# Patient Record
Sex: Female | Born: 1945
Health system: Southern US, Community
[De-identification: ages and names within clinical notes are randomized; demographics above are authoritative.]

## PROBLEM LIST (undated history)

## (undated) DIAGNOSIS — M1990 Unspecified osteoarthritis, unspecified site: Secondary | ICD-10-CM

## (undated) DIAGNOSIS — K219 Gastro-esophageal reflux disease without esophagitis: Secondary | ICD-10-CM

## (undated) DIAGNOSIS — M810 Age-related osteoporosis without current pathological fracture: Secondary | ICD-10-CM

## (undated) DIAGNOSIS — C801 Malignant (primary) neoplasm, unspecified: Secondary | ICD-10-CM

## (undated) DIAGNOSIS — C50919 Malignant neoplasm of unspecified site of unspecified female breast: Secondary | ICD-10-CM

## (undated) DIAGNOSIS — Z853 Personal history of malignant neoplasm of breast: Secondary | ICD-10-CM

## (undated) DIAGNOSIS — C8190 Hodgkin lymphoma, unspecified, unspecified site: Secondary | ICD-10-CM

## (undated) DIAGNOSIS — T7840XA Allergy, unspecified, initial encounter: Secondary | ICD-10-CM

## (undated) DIAGNOSIS — Z923 Personal history of irradiation: Secondary | ICD-10-CM

## (undated) DIAGNOSIS — I10 Essential (primary) hypertension: Secondary | ICD-10-CM

## (undated) HISTORY — PX: TUBAL LIGATION: SHX77

## (undated) HISTORY — DX: Age-related osteoporosis without current pathological fracture: M81.0

## (undated) HISTORY — DX: Gastro-esophageal reflux disease without esophagitis: K21.9

## (undated) HISTORY — DX: Allergy, unspecified, initial encounter: T78.40XA

## (undated) HISTORY — PX: EYE SURGERY: SHX253

## (undated) HISTORY — DX: Malignant (primary) neoplasm, unspecified: C80.1

## (undated) HISTORY — PX: BACK SURGERY: SHX140

## (undated) HISTORY — PX: PORT A CATH REVISION: SHX6033

## (undated) HISTORY — DX: Essential (primary) hypertension: I10

## (undated) HISTORY — DX: Personal history of malignant neoplasm of breast: Z85.3

## (undated) HISTORY — DX: Hodgkin lymphoma, unspecified, unspecified site: C81.90

## (undated) HISTORY — PX: JOINT REPLACEMENT: SHX530

## (undated) HISTORY — PX: ABDOMINAL HYSTERECTOMY: SHX81

---

## 2001-10-30 HISTORY — PX: COLONOSCOPY: SHX174

## 2002-10-30 DIAGNOSIS — C8190 Hodgkin lymphoma, unspecified, unspecified site: Secondary | ICD-10-CM

## 2002-10-30 HISTORY — DX: Hodgkin lymphoma, unspecified, unspecified site: C81.90

## 2003-10-31 HISTORY — PX: WRIST SURGERY: SHX841

## 2004-07-30 ENCOUNTER — Ambulatory Visit: Payer: Self-pay | Admitting: Internal Medicine

## 2004-08-30 ENCOUNTER — Ambulatory Visit: Payer: Self-pay | Admitting: Internal Medicine

## 2004-09-02 ENCOUNTER — Ambulatory Visit: Payer: Self-pay | Admitting: Internal Medicine

## 2004-09-07 ENCOUNTER — Ambulatory Visit: Payer: Self-pay | Admitting: Orthopedic Surgery

## 2004-10-11 ENCOUNTER — Ambulatory Visit: Payer: Self-pay | Admitting: Internal Medicine

## 2004-10-30 ENCOUNTER — Ambulatory Visit: Payer: Self-pay | Admitting: Internal Medicine

## 2004-10-30 DIAGNOSIS — I10 Essential (primary) hypertension: Secondary | ICD-10-CM

## 2004-10-30 HISTORY — DX: Essential (primary) hypertension: I10

## 2004-11-30 ENCOUNTER — Ambulatory Visit: Payer: Self-pay | Admitting: Internal Medicine

## 2004-12-02 ENCOUNTER — Ambulatory Visit: Payer: Self-pay | Admitting: Internal Medicine

## 2004-12-28 ENCOUNTER — Ambulatory Visit: Payer: Self-pay | Admitting: Internal Medicine

## 2005-01-06 ENCOUNTER — Ambulatory Visit: Payer: Self-pay | Admitting: Family Medicine

## 2005-01-28 ENCOUNTER — Ambulatory Visit: Payer: Self-pay | Admitting: Internal Medicine

## 2005-01-30 ENCOUNTER — Ambulatory Visit: Payer: Self-pay | Admitting: Family Medicine

## 2005-02-27 ENCOUNTER — Ambulatory Visit: Payer: Self-pay | Admitting: Internal Medicine

## 2005-05-17 ENCOUNTER — Ambulatory Visit: Payer: Self-pay | Admitting: Internal Medicine

## 2005-05-30 ENCOUNTER — Ambulatory Visit: Payer: Self-pay | Admitting: Internal Medicine

## 2005-06-09 ENCOUNTER — Ambulatory Visit: Payer: Self-pay | Admitting: Internal Medicine

## 2005-06-30 ENCOUNTER — Ambulatory Visit: Payer: Self-pay | Admitting: Internal Medicine

## 2005-07-30 ENCOUNTER — Ambulatory Visit: Payer: Self-pay | Admitting: Internal Medicine

## 2005-08-30 ENCOUNTER — Ambulatory Visit: Payer: Self-pay | Admitting: Internal Medicine

## 2005-09-22 ENCOUNTER — Ambulatory Visit: Payer: Self-pay | Admitting: Internal Medicine

## 2005-09-29 ENCOUNTER — Ambulatory Visit: Payer: Self-pay | Admitting: Internal Medicine

## 2005-10-30 ENCOUNTER — Ambulatory Visit: Payer: Self-pay | Admitting: Internal Medicine

## 2005-11-30 ENCOUNTER — Ambulatory Visit: Payer: Self-pay | Admitting: Internal Medicine

## 2005-12-28 ENCOUNTER — Ambulatory Visit: Payer: Self-pay | Admitting: Internal Medicine

## 2005-12-29 ENCOUNTER — Ambulatory Visit: Payer: Self-pay | Admitting: Internal Medicine

## 2006-01-15 ENCOUNTER — Ambulatory Visit: Payer: Self-pay | Admitting: Family Medicine

## 2006-01-28 ENCOUNTER — Ambulatory Visit: Payer: Self-pay | Admitting: Internal Medicine

## 2006-02-12 ENCOUNTER — Ambulatory Visit: Payer: Self-pay

## 2006-02-27 ENCOUNTER — Ambulatory Visit: Payer: Self-pay | Admitting: Internal Medicine

## 2006-03-30 ENCOUNTER — Ambulatory Visit: Payer: Self-pay | Admitting: Internal Medicine

## 2006-04-29 ENCOUNTER — Ambulatory Visit: Payer: Self-pay | Admitting: Internal Medicine

## 2006-05-30 ENCOUNTER — Ambulatory Visit: Payer: Self-pay | Admitting: Internal Medicine

## 2006-06-30 ENCOUNTER — Ambulatory Visit: Payer: Self-pay | Admitting: Internal Medicine

## 2006-07-13 ENCOUNTER — Ambulatory Visit: Payer: Self-pay | Admitting: Internal Medicine

## 2006-07-30 ENCOUNTER — Ambulatory Visit: Payer: Self-pay | Admitting: Internal Medicine

## 2006-08-30 ENCOUNTER — Ambulatory Visit: Payer: Self-pay | Admitting: Internal Medicine

## 2006-09-29 ENCOUNTER — Ambulatory Visit: Payer: Self-pay | Admitting: Internal Medicine

## 2006-11-19 ENCOUNTER — Ambulatory Visit: Payer: Self-pay | Admitting: Internal Medicine

## 2006-11-30 ENCOUNTER — Ambulatory Visit: Payer: Self-pay | Admitting: Internal Medicine

## 2006-12-29 ENCOUNTER — Ambulatory Visit: Payer: Self-pay | Admitting: Internal Medicine

## 2007-01-04 ENCOUNTER — Ambulatory Visit: Payer: Self-pay | Admitting: Internal Medicine

## 2007-01-29 ENCOUNTER — Ambulatory Visit: Payer: Self-pay | Admitting: Internal Medicine

## 2007-04-05 ENCOUNTER — Ambulatory Visit: Payer: Self-pay | Admitting: Family Medicine

## 2007-04-24 ENCOUNTER — Ambulatory Visit: Payer: Self-pay | Admitting: Internal Medicine

## 2007-04-30 ENCOUNTER — Ambulatory Visit: Payer: Self-pay | Admitting: Internal Medicine

## 2007-05-31 ENCOUNTER — Ambulatory Visit: Payer: Self-pay | Admitting: Internal Medicine

## 2007-07-31 ENCOUNTER — Ambulatory Visit: Payer: Self-pay | Admitting: Internal Medicine

## 2007-08-19 ENCOUNTER — Ambulatory Visit: Payer: Self-pay | Admitting: Internal Medicine

## 2007-08-31 ENCOUNTER — Ambulatory Visit: Payer: Self-pay | Admitting: Internal Medicine

## 2007-09-30 ENCOUNTER — Ambulatory Visit: Payer: Self-pay | Admitting: Internal Medicine

## 2007-10-19 ENCOUNTER — Ambulatory Visit: Payer: Self-pay | Admitting: Internal Medicine

## 2007-10-31 ENCOUNTER — Ambulatory Visit: Payer: Self-pay | Admitting: Internal Medicine

## 2007-11-13 ENCOUNTER — Ambulatory Visit: Payer: Self-pay | Admitting: Internal Medicine

## 2007-12-01 ENCOUNTER — Ambulatory Visit: Payer: Self-pay | Admitting: Internal Medicine

## 2008-01-29 ENCOUNTER — Ambulatory Visit: Payer: Self-pay | Admitting: Internal Medicine

## 2008-02-11 ENCOUNTER — Ambulatory Visit: Payer: Self-pay | Admitting: Internal Medicine

## 2008-02-28 ENCOUNTER — Ambulatory Visit: Payer: Self-pay | Admitting: Internal Medicine

## 2008-03-07 ENCOUNTER — Ambulatory Visit: Payer: Self-pay | Admitting: Family Medicine

## 2008-03-30 ENCOUNTER — Ambulatory Visit: Payer: Self-pay | Admitting: Internal Medicine

## 2008-05-28 ENCOUNTER — Ambulatory Visit: Payer: Self-pay | Admitting: Family Medicine

## 2008-05-28 ENCOUNTER — Ambulatory Visit: Payer: Self-pay | Admitting: Internal Medicine

## 2008-05-30 ENCOUNTER — Ambulatory Visit: Payer: Self-pay | Admitting: Internal Medicine

## 2008-06-02 ENCOUNTER — Ambulatory Visit: Payer: Self-pay | Admitting: Family Medicine

## 2008-06-30 ENCOUNTER — Ambulatory Visit: Payer: Self-pay | Admitting: Internal Medicine

## 2008-08-30 ENCOUNTER — Ambulatory Visit: Payer: Self-pay | Admitting: Internal Medicine

## 2008-09-29 ENCOUNTER — Ambulatory Visit: Payer: Self-pay | Admitting: Internal Medicine

## 2008-09-30 ENCOUNTER — Ambulatory Visit: Payer: Self-pay | Admitting: Internal Medicine

## 2008-10-30 ENCOUNTER — Ambulatory Visit: Payer: Self-pay | Admitting: Internal Medicine

## 2008-12-21 ENCOUNTER — Ambulatory Visit: Payer: Self-pay | Admitting: Family Medicine

## 2009-01-16 ENCOUNTER — Ambulatory Visit: Payer: Self-pay | Admitting: Nurse Practitioner

## 2009-01-16 ENCOUNTER — Ambulatory Visit: Payer: Self-pay | Admitting: Family Medicine

## 2009-01-27 ENCOUNTER — Ambulatory Visit: Payer: Self-pay | Admitting: Family Medicine

## 2009-01-28 ENCOUNTER — Ambulatory Visit: Payer: Self-pay | Admitting: Internal Medicine

## 2009-02-24 ENCOUNTER — Ambulatory Visit: Payer: Self-pay | Admitting: Internal Medicine

## 2009-02-27 ENCOUNTER — Ambulatory Visit: Payer: Self-pay | Admitting: Internal Medicine

## 2009-05-30 ENCOUNTER — Ambulatory Visit: Payer: Self-pay | Admitting: Internal Medicine

## 2009-05-31 ENCOUNTER — Ambulatory Visit: Payer: Self-pay | Admitting: General Surgery

## 2009-06-16 ENCOUNTER — Ambulatory Visit: Payer: Self-pay | Admitting: Internal Medicine

## 2009-06-30 ENCOUNTER — Ambulatory Visit: Payer: Self-pay | Admitting: Internal Medicine

## 2009-10-30 DIAGNOSIS — Z923 Personal history of irradiation: Secondary | ICD-10-CM

## 2009-10-30 DIAGNOSIS — Z853 Personal history of malignant neoplasm of breast: Secondary | ICD-10-CM

## 2009-10-30 HISTORY — DX: Personal history of malignant neoplasm of breast: Z85.3

## 2009-10-30 HISTORY — PX: BREAST SURGERY: SHX581

## 2009-10-30 HISTORY — PX: BREAST BIOPSY: SHX20

## 2009-10-30 HISTORY — DX: Personal history of irradiation: Z92.3

## 2009-10-30 HISTORY — PX: BREAST LUMPECTOMY: SHX2

## 2009-11-30 ENCOUNTER — Ambulatory Visit: Payer: Self-pay | Admitting: Internal Medicine

## 2009-12-15 ENCOUNTER — Ambulatory Visit: Payer: Self-pay | Admitting: Internal Medicine

## 2009-12-22 ENCOUNTER — Ambulatory Visit: Payer: Self-pay | Admitting: Internal Medicine

## 2009-12-28 ENCOUNTER — Ambulatory Visit: Payer: Self-pay | Admitting: Internal Medicine

## 2010-02-26 ENCOUNTER — Ambulatory Visit: Payer: Self-pay | Admitting: Internal Medicine

## 2010-04-12 ENCOUNTER — Ambulatory Visit: Payer: Self-pay | Admitting: Family Medicine

## 2010-04-21 ENCOUNTER — Encounter: Payer: Self-pay | Admitting: Orthopedic Surgery

## 2010-04-29 ENCOUNTER — Encounter: Payer: Self-pay | Admitting: Orthopedic Surgery

## 2010-05-30 ENCOUNTER — Ambulatory Visit: Payer: Self-pay | Admitting: Internal Medicine

## 2010-06-02 ENCOUNTER — Ambulatory Visit: Payer: Self-pay | Admitting: General Surgery

## 2010-06-14 ENCOUNTER — Ambulatory Visit: Payer: Self-pay | Admitting: General Surgery

## 2010-06-22 ENCOUNTER — Ambulatory Visit: Payer: Self-pay | Admitting: Internal Medicine

## 2010-06-30 ENCOUNTER — Ambulatory Visit: Payer: Self-pay | Admitting: Internal Medicine

## 2010-07-06 ENCOUNTER — Ambulatory Visit: Payer: Self-pay | Admitting: Internal Medicine

## 2010-07-20 ENCOUNTER — Ambulatory Visit: Payer: Self-pay | Admitting: General Surgery

## 2010-07-21 ENCOUNTER — Ambulatory Visit: Payer: Self-pay | Admitting: General Surgery

## 2010-07-30 ENCOUNTER — Ambulatory Visit: Payer: Self-pay | Admitting: Internal Medicine

## 2010-08-30 ENCOUNTER — Ambulatory Visit: Payer: Self-pay | Admitting: Internal Medicine

## 2010-09-29 ENCOUNTER — Ambulatory Visit: Payer: Self-pay | Admitting: Internal Medicine

## 2010-10-30 ENCOUNTER — Ambulatory Visit: Payer: Self-pay | Admitting: Internal Medicine

## 2010-11-30 ENCOUNTER — Ambulatory Visit: Payer: Self-pay | Admitting: General Surgery

## 2011-01-10 ENCOUNTER — Ambulatory Visit: Payer: Self-pay | Admitting: Internal Medicine

## 2011-01-29 ENCOUNTER — Ambulatory Visit: Payer: Self-pay | Admitting: Internal Medicine

## 2011-02-11 ENCOUNTER — Ambulatory Visit: Payer: Self-pay | Admitting: Internal Medicine

## 2011-03-09 ENCOUNTER — Ambulatory Visit: Payer: Self-pay | Admitting: Family Medicine

## 2011-03-14 ENCOUNTER — Ambulatory Visit: Payer: Self-pay | Admitting: Family Medicine

## 2011-04-18 ENCOUNTER — Ambulatory Visit: Payer: Self-pay | Admitting: Internal Medicine

## 2011-04-30 ENCOUNTER — Ambulatory Visit: Payer: Self-pay | Admitting: Internal Medicine

## 2011-06-06 ENCOUNTER — Ambulatory Visit: Payer: Self-pay | Admitting: General Surgery

## 2011-07-17 ENCOUNTER — Ambulatory Visit: Payer: Self-pay | Admitting: Internal Medicine

## 2011-07-25 ENCOUNTER — Ambulatory Visit: Payer: Self-pay | Admitting: Internal Medicine

## 2011-07-31 ENCOUNTER — Ambulatory Visit: Payer: Self-pay | Admitting: Internal Medicine

## 2011-09-05 ENCOUNTER — Ambulatory Visit: Payer: Self-pay | Admitting: Internal Medicine

## 2011-10-17 ENCOUNTER — Ambulatory Visit: Payer: Self-pay | Admitting: Internal Medicine

## 2011-10-31 ENCOUNTER — Ambulatory Visit: Payer: Self-pay | Admitting: Internal Medicine

## 2011-10-31 LAB — HM COLONOSCOPY: HM Colonoscopy: ABNORMAL — AB

## 2011-12-11 ENCOUNTER — Ambulatory Visit: Payer: Self-pay | Admitting: General Surgery

## 2011-12-11 DIAGNOSIS — Z853 Personal history of malignant neoplasm of breast: Secondary | ICD-10-CM | POA: Diagnosis not present

## 2011-12-11 DIAGNOSIS — Z1231 Encounter for screening mammogram for malignant neoplasm of breast: Secondary | ICD-10-CM | POA: Diagnosis not present

## 2011-12-18 DIAGNOSIS — C8190 Hodgkin lymphoma, unspecified, unspecified site: Secondary | ICD-10-CM | POA: Diagnosis not present

## 2011-12-18 DIAGNOSIS — C50919 Malignant neoplasm of unspecified site of unspecified female breast: Secondary | ICD-10-CM | POA: Diagnosis not present

## 2011-12-18 DIAGNOSIS — R252 Cramp and spasm: Secondary | ICD-10-CM | POA: Diagnosis not present

## 2011-12-18 DIAGNOSIS — Z853 Personal history of malignant neoplasm of breast: Secondary | ICD-10-CM | POA: Diagnosis not present

## 2011-12-18 DIAGNOSIS — K219 Gastro-esophageal reflux disease without esophagitis: Secondary | ICD-10-CM | POA: Diagnosis not present

## 2011-12-18 DIAGNOSIS — I10 Essential (primary) hypertension: Secondary | ICD-10-CM | POA: Diagnosis not present

## 2011-12-22 DIAGNOSIS — E559 Vitamin D deficiency, unspecified: Secondary | ICD-10-CM | POA: Diagnosis not present

## 2011-12-22 DIAGNOSIS — I10 Essential (primary) hypertension: Secondary | ICD-10-CM | POA: Diagnosis not present

## 2011-12-22 DIAGNOSIS — R252 Cramp and spasm: Secondary | ICD-10-CM | POA: Diagnosis not present

## 2012-01-29 ENCOUNTER — Ambulatory Visit: Payer: Self-pay

## 2012-01-29 DIAGNOSIS — J069 Acute upper respiratory infection, unspecified: Secondary | ICD-10-CM | POA: Diagnosis not present

## 2012-01-29 DIAGNOSIS — H6980 Other specified disorders of Eustachian tube, unspecified ear: Secondary | ICD-10-CM | POA: Diagnosis not present

## 2012-02-27 DIAGNOSIS — H93299 Other abnormal auditory perceptions, unspecified ear: Secondary | ICD-10-CM | POA: Diagnosis not present

## 2012-02-27 DIAGNOSIS — R42 Dizziness and giddiness: Secondary | ICD-10-CM | POA: Diagnosis not present

## 2012-02-27 DIAGNOSIS — H6120 Impacted cerumen, unspecified ear: Secondary | ICD-10-CM | POA: Diagnosis not present

## 2012-03-18 ENCOUNTER — Ambulatory Visit: Payer: Self-pay

## 2012-03-18 DIAGNOSIS — R51 Headache: Secondary | ICD-10-CM | POA: Diagnosis not present

## 2012-03-18 DIAGNOSIS — S0990XA Unspecified injury of head, initial encounter: Secondary | ICD-10-CM | POA: Diagnosis not present

## 2012-03-18 DIAGNOSIS — S060X0A Concussion without loss of consciousness, initial encounter: Secondary | ICD-10-CM | POA: Diagnosis not present

## 2012-03-19 ENCOUNTER — Emergency Department: Payer: Self-pay | Admitting: Emergency Medicine

## 2012-03-19 DIAGNOSIS — R110 Nausea: Secondary | ICD-10-CM | POA: Diagnosis not present

## 2012-03-19 DIAGNOSIS — R5381 Other malaise: Secondary | ICD-10-CM | POA: Diagnosis not present

## 2012-03-19 DIAGNOSIS — R51 Headache: Secondary | ICD-10-CM | POA: Diagnosis not present

## 2012-03-19 DIAGNOSIS — H538 Other visual disturbances: Secondary | ICD-10-CM | POA: Diagnosis not present

## 2012-03-19 LAB — URINALYSIS, COMPLETE
Bilirubin,UR: NEGATIVE
Glucose,UR: NEGATIVE mg/dL (ref 0–75)
Hyaline Cast: 20
Nitrite: NEGATIVE
Protein: NEGATIVE
RBC,UR: 2 /HPF (ref 0–5)
Waxy Cast: 2

## 2012-03-21 DIAGNOSIS — R252 Cramp and spasm: Secondary | ICD-10-CM | POA: Diagnosis not present

## 2012-03-21 DIAGNOSIS — M1990 Unspecified osteoarthritis, unspecified site: Secondary | ICD-10-CM | POA: Diagnosis not present

## 2012-03-21 DIAGNOSIS — I10 Essential (primary) hypertension: Secondary | ICD-10-CM | POA: Diagnosis not present

## 2012-03-21 DIAGNOSIS — S060X1A Concussion with loss of consciousness of 30 minutes or less, initial encounter: Secondary | ICD-10-CM | POA: Diagnosis not present

## 2012-04-09 ENCOUNTER — Ambulatory Visit: Payer: Self-pay | Admitting: Internal Medicine

## 2012-04-09 DIAGNOSIS — Z8571 Personal history of Hodgkin lymphoma: Secondary | ICD-10-CM | POA: Diagnosis not present

## 2012-04-09 DIAGNOSIS — C50919 Malignant neoplasm of unspecified site of unspecified female breast: Secondary | ICD-10-CM | POA: Diagnosis not present

## 2012-04-09 DIAGNOSIS — Z170 Estrogen receptor positive status [ER+]: Secondary | ICD-10-CM | POA: Diagnosis not present

## 2012-04-09 DIAGNOSIS — Z9221 Personal history of antineoplastic chemotherapy: Secondary | ICD-10-CM | POA: Diagnosis not present

## 2012-04-09 DIAGNOSIS — Z79811 Long term (current) use of aromatase inhibitors: Secondary | ICD-10-CM | POA: Diagnosis not present

## 2012-04-09 LAB — CBC CANCER CENTER
Basophil #: 0 x10 3/mm (ref 0.0–0.1)
Eosinophil #: 0.2 x10 3/mm (ref 0.0–0.7)
HCT: 37.2 % (ref 35.0–47.0)
HGB: 12.4 g/dL (ref 12.0–16.0)
Lymphocyte %: 25.4 %
MCH: 30.2 pg (ref 26.0–34.0)
MCHC: 33.3 g/dL (ref 32.0–36.0)
MCV: 91 fL (ref 80–100)
Monocyte #: 0.4 x10 3/mm (ref 0.2–0.9)
Monocyte %: 6.9 %
Neutrophil #: 4 x10 3/mm (ref 1.4–6.5)
RBC: 4.1 10*6/uL (ref 3.80–5.20)
RDW: 13.4 % (ref 11.5–14.5)

## 2012-04-09 LAB — SEDIMENTATION RATE: Erythrocyte Sed Rate: 30 mm/hr (ref 0–30)

## 2012-04-09 LAB — CREATININE, SERUM
Creatinine: 0.73 mg/dL (ref 0.60–1.30)
EGFR (African American): >60
EGFR (Non-African Amer.): >60

## 2012-04-15 DIAGNOSIS — R55 Syncope and collapse: Secondary | ICD-10-CM | POA: Diagnosis not present

## 2012-04-15 DIAGNOSIS — R112 Nausea with vomiting, unspecified: Secondary | ICD-10-CM | POA: Diagnosis not present

## 2012-04-15 DIAGNOSIS — Z79899 Other long term (current) drug therapy: Secondary | ICD-10-CM | POA: Diagnosis not present

## 2012-04-15 DIAGNOSIS — R1110 Vomiting, unspecified: Secondary | ICD-10-CM | POA: Diagnosis not present

## 2012-04-15 DIAGNOSIS — I10 Essential (primary) hypertension: Secondary | ICD-10-CM | POA: Diagnosis not present

## 2012-04-15 DIAGNOSIS — Z853 Personal history of malignant neoplasm of breast: Secondary | ICD-10-CM | POA: Diagnosis not present

## 2012-04-16 DIAGNOSIS — I10 Essential (primary) hypertension: Secondary | ICD-10-CM | POA: Diagnosis not present

## 2012-04-16 DIAGNOSIS — R112 Nausea with vomiting, unspecified: Secondary | ICD-10-CM | POA: Diagnosis not present

## 2012-04-16 DIAGNOSIS — R55 Syncope and collapse: Secondary | ICD-10-CM | POA: Diagnosis not present

## 2012-04-16 DIAGNOSIS — Z79899 Other long term (current) drug therapy: Secondary | ICD-10-CM | POA: Diagnosis not present

## 2012-04-23 DIAGNOSIS — M810 Age-related osteoporosis without current pathological fracture: Secondary | ICD-10-CM | POA: Diagnosis not present

## 2012-04-23 DIAGNOSIS — I9589 Other hypotension: Secondary | ICD-10-CM | POA: Diagnosis not present

## 2012-04-23 DIAGNOSIS — R55 Syncope and collapse: Secondary | ICD-10-CM | POA: Diagnosis not present

## 2012-04-23 DIAGNOSIS — I10 Essential (primary) hypertension: Secondary | ICD-10-CM | POA: Diagnosis not present

## 2012-04-29 ENCOUNTER — Ambulatory Visit: Payer: Self-pay | Admitting: Internal Medicine

## 2012-04-29 DIAGNOSIS — R55 Syncope and collapse: Secondary | ICD-10-CM | POA: Diagnosis not present

## 2012-05-13 DIAGNOSIS — R55 Syncope and collapse: Secondary | ICD-10-CM | POA: Diagnosis not present

## 2012-05-13 DIAGNOSIS — M899 Disorder of bone, unspecified: Secondary | ICD-10-CM | POA: Diagnosis not present

## 2012-05-13 DIAGNOSIS — M949 Disorder of cartilage, unspecified: Secondary | ICD-10-CM | POA: Diagnosis not present

## 2012-05-13 DIAGNOSIS — G43909 Migraine, unspecified, not intractable, without status migrainosus: Secondary | ICD-10-CM | POA: Diagnosis not present

## 2012-05-13 DIAGNOSIS — I10 Essential (primary) hypertension: Secondary | ICD-10-CM | POA: Diagnosis not present

## 2012-05-23 DIAGNOSIS — R55 Syncope and collapse: Secondary | ICD-10-CM | POA: Diagnosis not present

## 2012-05-23 DIAGNOSIS — M531 Cervicobrachial syndrome: Secondary | ICD-10-CM | POA: Diagnosis not present

## 2012-05-31 DIAGNOSIS — M531 Cervicobrachial syndrome: Secondary | ICD-10-CM | POA: Diagnosis not present

## 2012-06-06 ENCOUNTER — Ambulatory Visit: Payer: Self-pay | Admitting: General Surgery

## 2012-06-06 DIAGNOSIS — Z853 Personal history of malignant neoplasm of breast: Secondary | ICD-10-CM | POA: Diagnosis not present

## 2012-06-06 DIAGNOSIS — N6489 Other specified disorders of breast: Secondary | ICD-10-CM | POA: Diagnosis not present

## 2012-06-17 DIAGNOSIS — Z8571 Personal history of Hodgkin lymphoma: Secondary | ICD-10-CM | POA: Diagnosis not present

## 2012-06-17 DIAGNOSIS — I10 Essential (primary) hypertension: Secondary | ICD-10-CM | POA: Diagnosis not present

## 2012-06-17 DIAGNOSIS — M531 Cervicobrachial syndrome: Secondary | ICD-10-CM | POA: Diagnosis not present

## 2012-06-17 DIAGNOSIS — J40 Bronchitis, not specified as acute or chronic: Secondary | ICD-10-CM | POA: Diagnosis not present

## 2012-06-17 DIAGNOSIS — R55 Syncope and collapse: Secondary | ICD-10-CM | POA: Diagnosis not present

## 2012-06-17 DIAGNOSIS — Z853 Personal history of malignant neoplasm of breast: Secondary | ICD-10-CM | POA: Diagnosis not present

## 2012-06-17 DIAGNOSIS — G44309 Post-traumatic headache, unspecified, not intractable: Secondary | ICD-10-CM | POA: Diagnosis not present

## 2012-06-26 ENCOUNTER — Ambulatory Visit: Payer: Self-pay | Admitting: General Surgery

## 2012-06-26 DIAGNOSIS — R55 Syncope and collapse: Secondary | ICD-10-CM | POA: Diagnosis not present

## 2012-06-26 DIAGNOSIS — Z853 Personal history of malignant neoplasm of breast: Secondary | ICD-10-CM | POA: Diagnosis not present

## 2012-06-26 DIAGNOSIS — E669 Obesity, unspecified: Secondary | ICD-10-CM | POA: Diagnosis not present

## 2012-06-26 DIAGNOSIS — Z6837 Body mass index (BMI) 37.0-37.9, adult: Secondary | ICD-10-CM | POA: Diagnosis not present

## 2012-06-26 DIAGNOSIS — D126 Benign neoplasm of colon, unspecified: Secondary | ICD-10-CM | POA: Diagnosis not present

## 2012-06-26 DIAGNOSIS — I10 Essential (primary) hypertension: Secondary | ICD-10-CM | POA: Diagnosis not present

## 2012-06-26 DIAGNOSIS — Z1211 Encounter for screening for malignant neoplasm of colon: Secondary | ICD-10-CM | POA: Diagnosis not present

## 2012-06-26 DIAGNOSIS — Z79899 Other long term (current) drug therapy: Secondary | ICD-10-CM | POA: Diagnosis not present

## 2012-06-26 DIAGNOSIS — C8190 Hodgkin lymphoma, unspecified, unspecified site: Secondary | ICD-10-CM | POA: Diagnosis not present

## 2012-07-16 ENCOUNTER — Ambulatory Visit: Payer: Self-pay | Admitting: Internal Medicine

## 2012-07-16 DIAGNOSIS — Z170 Estrogen receptor positive status [ER+]: Secondary | ICD-10-CM | POA: Diagnosis not present

## 2012-07-16 DIAGNOSIS — Z79899 Other long term (current) drug therapy: Secondary | ICD-10-CM | POA: Diagnosis not present

## 2012-07-16 DIAGNOSIS — Z8571 Personal history of Hodgkin lymphoma: Secondary | ICD-10-CM | POA: Diagnosis not present

## 2012-07-16 DIAGNOSIS — C50919 Malignant neoplasm of unspecified site of unspecified female breast: Secondary | ICD-10-CM | POA: Diagnosis not present

## 2012-07-16 DIAGNOSIS — Z79811 Long term (current) use of aromatase inhibitors: Secondary | ICD-10-CM | POA: Diagnosis not present

## 2012-07-16 DIAGNOSIS — Z9221 Personal history of antineoplastic chemotherapy: Secondary | ICD-10-CM | POA: Diagnosis not present

## 2012-07-16 LAB — CREATININE, SERUM
Creatinine: 0.96 mg/dL (ref 0.60–1.30)
EGFR (African American): >60
EGFR (Non-African Amer.): >60

## 2012-07-16 LAB — CBC CANCER CENTER
Basophil #: 0.1 x10 3/mm (ref 0.0–0.1)
Eosinophil %: 2.4 %
HCT: 41.2 % (ref 35.0–47.0)
HGB: 13.5 g/dL (ref 12.0–16.0)
Lymphocyte %: 27.3 %
Monocyte %: 7.6 %
Neutrophil #: 4.7 x10 3/mm (ref 1.4–6.5)
RBC: 4.55 10*6/uL (ref 3.80–5.20)
RDW: 14.2 % (ref 11.5–14.5)
WBC: 7.6 x10 3/mm (ref 3.6–11.0)

## 2012-07-16 LAB — LACTATE DEHYDROGENASE: LDH: 198 U/L (ref 81–234)

## 2012-07-23 DIAGNOSIS — Z23 Encounter for immunization: Secondary | ICD-10-CM | POA: Diagnosis not present

## 2012-07-23 DIAGNOSIS — I10 Essential (primary) hypertension: Secondary | ICD-10-CM | POA: Diagnosis not present

## 2012-07-23 DIAGNOSIS — R05 Cough: Secondary | ICD-10-CM | POA: Diagnosis not present

## 2012-07-30 ENCOUNTER — Ambulatory Visit: Payer: Self-pay | Admitting: Internal Medicine

## 2012-10-08 DIAGNOSIS — M1710 Unilateral primary osteoarthritis, unspecified knee: Secondary | ICD-10-CM | POA: Diagnosis not present

## 2012-10-29 DIAGNOSIS — R55 Syncope and collapse: Secondary | ICD-10-CM | POA: Diagnosis not present

## 2012-10-29 DIAGNOSIS — I10 Essential (primary) hypertension: Secondary | ICD-10-CM | POA: Diagnosis not present

## 2012-10-29 DIAGNOSIS — M1990 Unspecified osteoarthritis, unspecified site: Secondary | ICD-10-CM | POA: Diagnosis not present

## 2012-11-12 ENCOUNTER — Ambulatory Visit: Payer: Self-pay | Admitting: Internal Medicine

## 2012-11-12 DIAGNOSIS — R61 Generalized hyperhidrosis: Secondary | ICD-10-CM | POA: Diagnosis not present

## 2012-11-12 DIAGNOSIS — Z79899 Other long term (current) drug therapy: Secondary | ICD-10-CM | POA: Diagnosis not present

## 2012-11-12 DIAGNOSIS — Z803 Family history of malignant neoplasm of breast: Secondary | ICD-10-CM | POA: Diagnosis not present

## 2012-11-12 DIAGNOSIS — C50919 Malignant neoplasm of unspecified site of unspecified female breast: Secondary | ICD-10-CM | POA: Diagnosis not present

## 2012-11-12 DIAGNOSIS — Z170 Estrogen receptor positive status [ER+]: Secondary | ICD-10-CM | POA: Diagnosis not present

## 2012-11-12 DIAGNOSIS — Z8571 Personal history of Hodgkin lymphoma: Secondary | ICD-10-CM | POA: Diagnosis not present

## 2012-11-12 DIAGNOSIS — M899 Disorder of bone, unspecified: Secondary | ICD-10-CM | POA: Diagnosis not present

## 2012-11-12 DIAGNOSIS — Z9221 Personal history of antineoplastic chemotherapy: Secondary | ICD-10-CM | POA: Diagnosis not present

## 2012-11-12 DIAGNOSIS — Z79811 Long term (current) use of aromatase inhibitors: Secondary | ICD-10-CM | POA: Diagnosis not present

## 2012-11-12 LAB — CBC CANCER CENTER
Basophil #: 0 x10 3/mm (ref 0.0–0.1)
Basophil %: 0.5 %
Eosinophil #: 0.1 x10 3/mm (ref 0.0–0.7)
HGB: 12.9 g/dL (ref 12.0–16.0)
Lymphocyte #: 1.7 x10 3/mm (ref 1.0–3.6)
Lymphocyte %: 26.5 %
MCHC: 33.4 g/dL (ref 32.0–36.0)
Monocyte #: 0.5 x10 3/mm (ref 0.2–0.9)
Monocyte %: 6.9 %
Neutrophil #: 4.2 x10 3/mm (ref 1.4–6.5)
Neutrophil %: 63.8 %
RBC: 4.27 10*6/uL (ref 3.80–5.20)
WBC: 6.6 x10 3/mm (ref 3.6–11.0)

## 2012-11-12 LAB — LACTATE DEHYDROGENASE: LDH: 181 U/L (ref 81–246)

## 2012-11-12 LAB — CREATININE, SERUM: Creatinine: 0.93 mg/dL (ref 0.60–1.30)

## 2012-11-14 ENCOUNTER — Ambulatory Visit: Payer: Self-pay | Admitting: Internal Medicine

## 2012-11-14 DIAGNOSIS — C8190 Hodgkin lymphoma, unspecified, unspecified site: Secondary | ICD-10-CM | POA: Diagnosis not present

## 2012-11-14 DIAGNOSIS — Z13820 Encounter for screening for osteoporosis: Secondary | ICD-10-CM | POA: Diagnosis not present

## 2012-11-14 DIAGNOSIS — R918 Other nonspecific abnormal finding of lung field: Secondary | ICD-10-CM | POA: Diagnosis not present

## 2012-11-30 ENCOUNTER — Ambulatory Visit: Payer: Self-pay | Admitting: Internal Medicine

## 2012-12-02 DIAGNOSIS — I10 Essential (primary) hypertension: Secondary | ICD-10-CM | POA: Diagnosis not present

## 2012-12-02 DIAGNOSIS — E669 Obesity, unspecified: Secondary | ICD-10-CM | POA: Diagnosis not present

## 2012-12-02 DIAGNOSIS — R55 Syncope and collapse: Secondary | ICD-10-CM | POA: Diagnosis not present

## 2013-01-23 DIAGNOSIS — H6120 Impacted cerumen, unspecified ear: Secondary | ICD-10-CM | POA: Diagnosis not present

## 2013-01-23 DIAGNOSIS — J301 Allergic rhinitis due to pollen: Secondary | ICD-10-CM | POA: Diagnosis not present

## 2013-01-23 DIAGNOSIS — H93299 Other abnormal auditory perceptions, unspecified ear: Secondary | ICD-10-CM | POA: Diagnosis not present

## 2013-02-10 DIAGNOSIS — M26629 Arthralgia of temporomandibular joint, unspecified side: Secondary | ICD-10-CM | POA: Diagnosis not present

## 2013-02-10 DIAGNOSIS — J40 Bronchitis, not specified as acute or chronic: Secondary | ICD-10-CM | POA: Diagnosis not present

## 2013-02-28 DIAGNOSIS — H9209 Otalgia, unspecified ear: Secondary | ICD-10-CM | POA: Diagnosis not present

## 2013-02-28 DIAGNOSIS — M26609 Unspecified temporomandibular joint disorder, unspecified side: Secondary | ICD-10-CM | POA: Diagnosis not present

## 2013-03-05 DIAGNOSIS — H2510 Age-related nuclear cataract, unspecified eye: Secondary | ICD-10-CM | POA: Diagnosis not present

## 2013-03-21 ENCOUNTER — Encounter: Payer: Self-pay | Admitting: *Deleted

## 2013-03-26 DIAGNOSIS — I10 Essential (primary) hypertension: Secondary | ICD-10-CM | POA: Diagnosis not present

## 2013-03-26 DIAGNOSIS — R609 Edema, unspecified: Secondary | ICD-10-CM | POA: Diagnosis not present

## 2013-03-26 DIAGNOSIS — R351 Nocturia: Secondary | ICD-10-CM | POA: Diagnosis not present

## 2013-04-14 DIAGNOSIS — E8881 Metabolic syndrome: Secondary | ICD-10-CM | POA: Diagnosis not present

## 2013-04-14 DIAGNOSIS — R609 Edema, unspecified: Secondary | ICD-10-CM | POA: Diagnosis not present

## 2013-04-14 DIAGNOSIS — I10 Essential (primary) hypertension: Secondary | ICD-10-CM | POA: Diagnosis not present

## 2013-04-14 DIAGNOSIS — M1990 Unspecified osteoarthritis, unspecified site: Secondary | ICD-10-CM | POA: Diagnosis not present

## 2013-05-13 ENCOUNTER — Ambulatory Visit: Payer: Self-pay | Admitting: Internal Medicine

## 2013-05-13 DIAGNOSIS — Z8571 Personal history of Hodgkin lymphoma: Secondary | ICD-10-CM | POA: Diagnosis not present

## 2013-05-13 DIAGNOSIS — R232 Flushing: Secondary | ICD-10-CM | POA: Diagnosis not present

## 2013-05-13 DIAGNOSIS — Z79899 Other long term (current) drug therapy: Secondary | ICD-10-CM | POA: Diagnosis not present

## 2013-05-13 DIAGNOSIS — Z170 Estrogen receptor positive status [ER+]: Secondary | ICD-10-CM | POA: Diagnosis not present

## 2013-05-13 DIAGNOSIS — Z923 Personal history of irradiation: Secondary | ICD-10-CM | POA: Diagnosis not present

## 2013-05-13 DIAGNOSIS — C50919 Malignant neoplasm of unspecified site of unspecified female breast: Secondary | ICD-10-CM | POA: Diagnosis not present

## 2013-05-13 DIAGNOSIS — Z79811 Long term (current) use of aromatase inhibitors: Secondary | ICD-10-CM | POA: Diagnosis not present

## 2013-05-13 DIAGNOSIS — Z803 Family history of malignant neoplasm of breast: Secondary | ICD-10-CM | POA: Diagnosis not present

## 2013-05-13 LAB — CREATININE, SERUM
Creatinine: 0.88 mg/dL (ref 0.60–1.30)
EGFR (African American): >60

## 2013-05-13 LAB — LACTATE DEHYDROGENASE: LDH: 165 U/L (ref 81–246)

## 2013-05-13 LAB — CBC CANCER CENTER
Basophil #: 0.1 x10 3/mm (ref 0.0–0.1)
Basophil %: 1 %
Eosinophil #: 0.2 x10 3/mm (ref 0.0–0.7)
Lymphocyte #: 2.4 x10 3/mm (ref 1.0–3.6)
Lymphocyte %: 33.8 %
MCH: 30.2 pg (ref 26.0–34.0)
Monocyte #: 0.5 x10 3/mm (ref 0.2–0.9)
Monocyte %: 7.3 %
Neutrophil #: 3.9 x10 3/mm (ref 1.4–6.5)
Neutrophil %: 55.4 %
Platelet: 210 x10 3/mm (ref 150–440)
RBC: 4.22 10*6/uL (ref 3.80–5.20)
RDW: 13.9 % (ref 11.5–14.5)
WBC: 7.1 x10 3/mm (ref 3.6–11.0)

## 2013-05-16 DIAGNOSIS — I10 Essential (primary) hypertension: Secondary | ICD-10-CM | POA: Diagnosis not present

## 2013-05-16 DIAGNOSIS — R609 Edema, unspecified: Secondary | ICD-10-CM | POA: Diagnosis not present

## 2013-05-16 DIAGNOSIS — M26629 Arthralgia of temporomandibular joint, unspecified side: Secondary | ICD-10-CM | POA: Diagnosis not present

## 2013-05-16 DIAGNOSIS — E669 Obesity, unspecified: Secondary | ICD-10-CM | POA: Diagnosis not present

## 2013-05-30 ENCOUNTER — Ambulatory Visit: Payer: Self-pay | Admitting: Internal Medicine

## 2013-06-11 ENCOUNTER — Ambulatory Visit: Payer: Self-pay | Admitting: General Surgery

## 2013-06-11 ENCOUNTER — Encounter: Payer: Self-pay | Admitting: General Surgery

## 2013-06-11 DIAGNOSIS — Z853 Personal history of malignant neoplasm of breast: Secondary | ICD-10-CM | POA: Diagnosis not present

## 2013-06-11 DIAGNOSIS — R928 Other abnormal and inconclusive findings on diagnostic imaging of breast: Secondary | ICD-10-CM | POA: Diagnosis not present

## 2013-06-17 DIAGNOSIS — I10 Essential (primary) hypertension: Secondary | ICD-10-CM | POA: Diagnosis not present

## 2013-06-17 DIAGNOSIS — Z23 Encounter for immunization: Secondary | ICD-10-CM | POA: Diagnosis not present

## 2013-06-17 DIAGNOSIS — E669 Obesity, unspecified: Secondary | ICD-10-CM | POA: Diagnosis not present

## 2013-06-26 ENCOUNTER — Ambulatory Visit (INDEPENDENT_AMBULATORY_CARE_PROVIDER_SITE_OTHER): Payer: Medicare Other | Admitting: General Surgery

## 2013-06-26 ENCOUNTER — Encounter: Payer: Self-pay | Admitting: General Surgery

## 2013-06-26 VITALS — BP 110/74 | HR 78 | Resp 12 | Ht 65.0 in | Wt 214.0 lb

## 2013-06-26 DIAGNOSIS — C50911 Malignant neoplasm of unspecified site of right female breast: Secondary | ICD-10-CM | POA: Insufficient documentation

## 2013-06-26 DIAGNOSIS — Z853 Personal history of malignant neoplasm of breast: Secondary | ICD-10-CM

## 2013-06-26 NOTE — Patient Instructions (Addendum)
Patient to return in 1 year with bilateral diagnostic mammogram at [facility].

## 2013-06-26 NOTE — Progress Notes (Signed)
Patient ID: Mary Todd, female   DOB: January 03, 1946, 67 y.o.   MRN: [id number]  Chief Complaint  Patient presents with  . Breast Cancer Long Term Follow Up    1 year follow up bilateral diagnostic mammogram.     HPI Mary Todd is a 67 y.o. female who presents for a 1 year follow up bilateral diagnostic mammogram. The most recent mammogram was done on 06/11/13 with a birad category 2. The patient denies any new problems with the breasts at this time.   HPI  Past Medical History  Diagnosis Date  . Hypertension 2006  . Hodgkin's disease   . Personal history of malignant neoplasm of breast 2011  . Esophageal reflux     Past Surgical History  Procedure Laterality Date  . Colonoscopy  2003  . Port a cath revision    . Wrist surgery  2005  . Back surgery    . Abdominal hysterectomy    . Breast surgery Right 2011    right breast lumpectomy,L/SN/R for right breast CA     Family History  Problem Relation Age of Onset  . Cancer Father   . Stroke Brother     Social History History  Substance Use Topics  . Smoking status: Never Smoker   . Smokeless tobacco: Not on file  . Alcohol Use: Yes    No Known Allergies  Current Outpatient Prescriptions  Medication Sig Dispense Refill  . alendronate (FOSAMAX) 70 MG tablet Take 70 mg by mouth every 7 (seven) days. Take with a full glass of water on an empty stomach.      Marland Kitchen anastrozole (ARIMIDEX) 1 MG tablet Take 1 tablet by mouth daily.      . cetirizine (ZYRTEC) 10 MG tablet Take 10 mg by mouth daily.      . Cholecalciferol (VITAMIN D-3 PO) Take 1 tablet by mouth daily.      . diphenhydrAMINE (SOMINEX) 25 MG tablet Take 25 mg by mouth at bedtime as needed for sleep.      . hydrALAZINE (APRESOLINE) 25 MG tablet Take 1 tablet by mouth 2 (two) times daily.      . hydrochlorothiazide (HYDRODIURIL) 25 MG tablet Take 1 tablet by mouth. Every other day.      . meloxicam (MOBIC) 15 MG tablet Take 1 tablet by mouth daily.      . metoprolol  succinate (TOPROL-XL) 25 MG 24 hr tablet Take 1 tablet by mouth 2 (two) times daily.      . Multiple Vitamin (MULTIVITAMIN) tablet Take 1 tablet by mouth daily.      . pantoprazole (PROTONIX) 40 MG tablet Take 1 tablet by mouth daily.       No current facility-administered medications for this visit.    Review of Systems Review of Systems  Constitutional: Negative.   Respiratory: Negative.   Cardiovascular: Negative.     Blood pressure 110/74, pulse 78, resp. rate 12, height 5\' 5"  (1.651 m), weight 214 lb (97.07 kg).  Physical Exam Physical Exam  Constitutional: She is oriented to person, place, and time. She appears well-developed and well-nourished.  Eyes: Conjunctivae are normal. No scleral icterus.  Neck: No thyromegaly present.  Cardiovascular: Normal rate, regular rhythm and normal heart sounds.   No murmur heard. Pulses:      Dorsalis pedis pulses are 2+ on the right side, and 2+ on the left side.       Posterior tibial pulses are 1+ on the right side, and 1+ on the  left side.  No edema  Pulmonary/Chest: Effort normal and breath sounds normal. Right breast exhibits no inverted nipple, no mass, no nipple discharge, no skin change and no tenderness. Left breast exhibits no inverted nipple, no mass, no nipple discharge, no skin change and no tenderness.  Upper Inner Quadrant of right breast lumpectomy site hard mass unchanged from before.   Abdominal: Soft. Normal appearance and bowel sounds are normal. There is no hepatosplenomegaly. There is no tenderness. No hernia.  Lymphadenopathy:    She has no cervical adenopathy.    She has no axillary adenopathy.  Neurological: She is alert and oriented to person, place, and time.  Skin: Skin is warm and dry.    Data Reviewed  Mammogram reviewed.   Assessment    Exam stable.  Pt is 3 yrs post right breast lumpectomy, SN biopsy and radiation. Currently on Anastrazole and doing well.    Plan    Patient to return in 1 year  with bilateral diagnostic mammogram at [facility].        SANKAR,SEEPLAPUTHUR G 06/26/2013, 9:41 AM

## 2013-08-14 DIAGNOSIS — IMO0002 Reserved for concepts with insufficient information to code with codable children: Secondary | ICD-10-CM | POA: Diagnosis not present

## 2013-08-14 DIAGNOSIS — M2390 Unspecified internal derangement of unspecified knee: Secondary | ICD-10-CM | POA: Diagnosis not present

## 2013-08-14 DIAGNOSIS — M1710 Unilateral primary osteoarthritis, unspecified knee: Secondary | ICD-10-CM | POA: Diagnosis not present

## 2013-08-18 DIAGNOSIS — Z23 Encounter for immunization: Secondary | ICD-10-CM | POA: Diagnosis not present

## 2013-08-18 DIAGNOSIS — I10 Essential (primary) hypertension: Secondary | ICD-10-CM | POA: Diagnosis not present

## 2013-08-18 DIAGNOSIS — E669 Obesity, unspecified: Secondary | ICD-10-CM | POA: Diagnosis not present

## 2013-08-18 DIAGNOSIS — M79609 Pain in unspecified limb: Secondary | ICD-10-CM | POA: Diagnosis not present

## 2013-09-02 DIAGNOSIS — Z1159 Encounter for screening for other viral diseases: Secondary | ICD-10-CM | POA: Diagnosis not present

## 2013-09-02 DIAGNOSIS — Z1211 Encounter for screening for malignant neoplasm of colon: Secondary | ICD-10-CM | POA: Diagnosis not present

## 2013-09-02 DIAGNOSIS — Z0000 Encounter for general adult medical examination without abnormal findings: Secondary | ICD-10-CM | POA: Diagnosis not present

## 2013-09-02 DIAGNOSIS — Z1331 Encounter for screening for depression: Secondary | ICD-10-CM | POA: Diagnosis not present

## 2013-10-17 DIAGNOSIS — E669 Obesity, unspecified: Secondary | ICD-10-CM | POA: Diagnosis not present

## 2013-10-17 DIAGNOSIS — M25549 Pain in joints of unspecified hand: Secondary | ICD-10-CM | POA: Diagnosis not present

## 2013-10-17 DIAGNOSIS — I10 Essential (primary) hypertension: Secondary | ICD-10-CM | POA: Diagnosis not present

## 2013-11-03 ENCOUNTER — Ambulatory Visit: Payer: Self-pay | Admitting: Internal Medicine

## 2013-11-03 DIAGNOSIS — C8190 Hodgkin lymphoma, unspecified, unspecified site: Secondary | ICD-10-CM | POA: Diagnosis not present

## 2013-11-03 DIAGNOSIS — C8589 Other specified types of non-Hodgkin lymphoma, extranodal and solid organ sites: Secondary | ICD-10-CM | POA: Diagnosis not present

## 2013-11-11 ENCOUNTER — Ambulatory Visit: Payer: Self-pay | Admitting: Internal Medicine

## 2013-11-11 DIAGNOSIS — C50919 Malignant neoplasm of unspecified site of unspecified female breast: Secondary | ICD-10-CM | POA: Diagnosis not present

## 2013-11-11 DIAGNOSIS — Z79811 Long term (current) use of aromatase inhibitors: Secondary | ICD-10-CM | POA: Diagnosis not present

## 2013-11-11 DIAGNOSIS — Z9221 Personal history of antineoplastic chemotherapy: Secondary | ICD-10-CM | POA: Diagnosis not present

## 2013-11-11 DIAGNOSIS — Z803 Family history of malignant neoplasm of breast: Secondary | ICD-10-CM | POA: Diagnosis not present

## 2013-11-11 DIAGNOSIS — Z8571 Personal history of Hodgkin lymphoma: Secondary | ICD-10-CM | POA: Diagnosis not present

## 2013-11-11 DIAGNOSIS — Z923 Personal history of irradiation: Secondary | ICD-10-CM | POA: Diagnosis not present

## 2013-11-11 DIAGNOSIS — Z79899 Other long term (current) drug therapy: Secondary | ICD-10-CM | POA: Diagnosis not present

## 2013-11-11 DIAGNOSIS — M25649 Stiffness of unspecified hand, not elsewhere classified: Secondary | ICD-10-CM | POA: Diagnosis not present

## 2013-11-11 DIAGNOSIS — Z170 Estrogen receptor positive status [ER+]: Secondary | ICD-10-CM | POA: Diagnosis not present

## 2013-11-11 DIAGNOSIS — Z800 Family history of malignant neoplasm of digestive organs: Secondary | ICD-10-CM | POA: Diagnosis not present

## 2013-11-11 LAB — CBC CANCER CENTER
BASOS ABS: 0.1 x10 3/mm (ref 0.0–0.1)
BASOS PCT: 1 %
Eosinophil #: 0.2 x10 3/mm (ref 0.0–0.7)
Eosinophil %: 3.5 %
HCT: 39 % (ref 35.0–47.0)
HGB: 12.9 g/dL (ref 12.0–16.0)
LYMPHS PCT: 31.5 %
Lymphocyte #: 2.1 x10 3/mm (ref 1.0–3.6)
MCH: 30.2 pg (ref 26.0–34.0)
MCHC: 33.1 g/dL (ref 32.0–36.0)
MCV: 91 fL (ref 80–100)
Monocyte #: 0.5 x10 3/mm (ref 0.2–0.9)
Monocyte %: 8.1 %
NEUTROS PCT: 55.9 %
Neutrophil #: 3.7 x10 3/mm (ref 1.4–6.5)
Platelet: 218 x10 3/mm (ref 150–440)
RBC: 4.27 10*6/uL (ref 3.80–5.20)
RDW: 13.9 % (ref 11.5–14.5)
WBC: 6.7 x10 3/mm (ref 3.6–11.0)

## 2013-11-11 LAB — CREATININE, SERUM
Creatinine: 0.84 mg/dL (ref 0.60–1.30)
EGFR (Non-African Amer.): >60

## 2013-11-11 LAB — LACTATE DEHYDROGENASE: LDH: 197 U/L (ref 81–246)

## 2013-11-20 DIAGNOSIS — M79609 Pain in unspecified limb: Secondary | ICD-10-CM | POA: Diagnosis not present

## 2013-11-20 DIAGNOSIS — M1990 Unspecified osteoarthritis, unspecified site: Secondary | ICD-10-CM | POA: Diagnosis not present

## 2013-11-30 ENCOUNTER — Ambulatory Visit: Payer: Self-pay | Admitting: Internal Medicine

## 2013-12-19 DIAGNOSIS — M25549 Pain in joints of unspecified hand: Secondary | ICD-10-CM | POA: Diagnosis not present

## 2014-01-28 DIAGNOSIS — M064 Inflammatory polyarthropathy: Secondary | ICD-10-CM | POA: Diagnosis not present

## 2014-01-28 DIAGNOSIS — M6530 Trigger finger, unspecified finger: Secondary | ICD-10-CM | POA: Diagnosis not present

## 2014-01-28 DIAGNOSIS — M25569 Pain in unspecified knee: Secondary | ICD-10-CM | POA: Diagnosis not present

## 2014-01-28 DIAGNOSIS — M25549 Pain in joints of unspecified hand: Secondary | ICD-10-CM | POA: Diagnosis not present

## 2014-02-19 DIAGNOSIS — M1710 Unilateral primary osteoarthritis, unspecified knee: Secondary | ICD-10-CM | POA: Diagnosis not present

## 2014-02-19 DIAGNOSIS — M064 Inflammatory polyarthropathy: Secondary | ICD-10-CM | POA: Diagnosis not present

## 2014-02-19 DIAGNOSIS — IMO0002 Reserved for concepts with insufficient information to code with codable children: Secondary | ICD-10-CM | POA: Diagnosis not present

## 2014-02-19 DIAGNOSIS — M6530 Trigger finger, unspecified finger: Secondary | ICD-10-CM | POA: Diagnosis not present

## 2014-03-17 DIAGNOSIS — M25569 Pain in unspecified knee: Secondary | ICD-10-CM | POA: Diagnosis not present

## 2014-03-17 DIAGNOSIS — I10 Essential (primary) hypertension: Secondary | ICD-10-CM | POA: Diagnosis not present

## 2014-03-17 DIAGNOSIS — E785 Hyperlipidemia, unspecified: Secondary | ICD-10-CM | POA: Diagnosis not present

## 2014-03-17 DIAGNOSIS — R7309 Other abnormal glucose: Secondary | ICD-10-CM | POA: Diagnosis not present

## 2014-03-19 DIAGNOSIS — E785 Hyperlipidemia, unspecified: Secondary | ICD-10-CM | POA: Diagnosis not present

## 2014-03-19 DIAGNOSIS — I10 Essential (primary) hypertension: Secondary | ICD-10-CM | POA: Diagnosis not present

## 2014-03-19 LAB — LIPID PANEL
CHOLESTEROL: 183 mg/dL (ref 0–200)
HDL: 74 mg/dL — AB (ref 35–70)
LDL CALC: 99 mg/dL
TRIGLYCERIDES: 51 mg/dL (ref 40–160)

## 2014-03-24 DIAGNOSIS — M2240 Chondromalacia patellae, unspecified knee: Secondary | ICD-10-CM | POA: Diagnosis not present

## 2014-04-02 DIAGNOSIS — M25569 Pain in unspecified knee: Secondary | ICD-10-CM | POA: Diagnosis not present

## 2014-04-02 DIAGNOSIS — R262 Difficulty in walking, not elsewhere classified: Secondary | ICD-10-CM | POA: Diagnosis not present

## 2014-04-06 DIAGNOSIS — R262 Difficulty in walking, not elsewhere classified: Secondary | ICD-10-CM | POA: Diagnosis not present

## 2014-04-06 DIAGNOSIS — M25569 Pain in unspecified knee: Secondary | ICD-10-CM | POA: Diagnosis not present

## 2014-04-08 DIAGNOSIS — R262 Difficulty in walking, not elsewhere classified: Secondary | ICD-10-CM | POA: Diagnosis not present

## 2014-04-08 DIAGNOSIS — M25569 Pain in unspecified knee: Secondary | ICD-10-CM | POA: Diagnosis not present

## 2014-04-13 DIAGNOSIS — R262 Difficulty in walking, not elsewhere classified: Secondary | ICD-10-CM | POA: Diagnosis not present

## 2014-04-13 DIAGNOSIS — M25569 Pain in unspecified knee: Secondary | ICD-10-CM | POA: Diagnosis not present

## 2014-04-15 DIAGNOSIS — M25569 Pain in unspecified knee: Secondary | ICD-10-CM | POA: Diagnosis not present

## 2014-04-15 DIAGNOSIS — R262 Difficulty in walking, not elsewhere classified: Secondary | ICD-10-CM | POA: Diagnosis not present

## 2014-04-21 DIAGNOSIS — R262 Difficulty in walking, not elsewhere classified: Secondary | ICD-10-CM | POA: Diagnosis not present

## 2014-04-21 DIAGNOSIS — M25569 Pain in unspecified knee: Secondary | ICD-10-CM | POA: Diagnosis not present

## 2014-04-23 DIAGNOSIS — M25569 Pain in unspecified knee: Secondary | ICD-10-CM | POA: Diagnosis not present

## 2014-04-23 DIAGNOSIS — R262 Difficulty in walking, not elsewhere classified: Secondary | ICD-10-CM | POA: Diagnosis not present

## 2014-04-29 DIAGNOSIS — R262 Difficulty in walking, not elsewhere classified: Secondary | ICD-10-CM | POA: Diagnosis not present

## 2014-04-29 DIAGNOSIS — M25569 Pain in unspecified knee: Secondary | ICD-10-CM | POA: Diagnosis not present

## 2014-05-12 ENCOUNTER — Ambulatory Visit: Payer: Self-pay | Admitting: Internal Medicine

## 2014-05-12 DIAGNOSIS — Z79899 Other long term (current) drug therapy: Secondary | ICD-10-CM | POA: Diagnosis not present

## 2014-05-12 DIAGNOSIS — Z800 Family history of malignant neoplasm of digestive organs: Secondary | ICD-10-CM | POA: Diagnosis not present

## 2014-05-12 DIAGNOSIS — Z170 Estrogen receptor positive status [ER+]: Secondary | ICD-10-CM | POA: Diagnosis not present

## 2014-05-12 DIAGNOSIS — Z803 Family history of malignant neoplasm of breast: Secondary | ICD-10-CM | POA: Diagnosis not present

## 2014-05-12 DIAGNOSIS — R0609 Other forms of dyspnea: Secondary | ICD-10-CM | POA: Diagnosis not present

## 2014-05-12 DIAGNOSIS — Z9221 Personal history of antineoplastic chemotherapy: Secondary | ICD-10-CM | POA: Diagnosis not present

## 2014-05-12 DIAGNOSIS — Z8571 Personal history of Hodgkin lymphoma: Secondary | ICD-10-CM | POA: Diagnosis not present

## 2014-05-12 DIAGNOSIS — C50919 Malignant neoplasm of unspecified site of unspecified female breast: Secondary | ICD-10-CM | POA: Diagnosis not present

## 2014-05-12 DIAGNOSIS — Z79811 Long term (current) use of aromatase inhibitors: Secondary | ICD-10-CM | POA: Diagnosis not present

## 2014-05-12 LAB — CBC CANCER CENTER
BASOS ABS: 0.1 x10 3/mm (ref 0.0–0.1)
BASOS PCT: 1 %
Eosinophil #: 0.2 x10 3/mm (ref 0.0–0.7)
Eosinophil %: 3.3 %
HCT: 39.2 % (ref 35.0–47.0)
HGB: 13 g/dL (ref 12.0–16.0)
LYMPHS ABS: 2.1 x10 3/mm (ref 1.0–3.6)
LYMPHS PCT: 34.7 %
MCH: 30.3 pg (ref 26.0–34.0)
MCHC: 33.1 g/dL (ref 32.0–36.0)
MCV: 92 fL (ref 80–100)
Monocyte #: 0.5 x10 3/mm (ref 0.2–0.9)
Monocyte %: 8.9 %
Neutrophil #: 3.1 x10 3/mm (ref 1.4–6.5)
Neutrophil %: 52.1 %
Platelet: 180 x10 3/mm (ref 150–440)
RBC: 4.29 10*6/uL (ref 3.80–5.20)
RDW: 13.8 % (ref 11.5–14.5)
WBC: 6 x10 3/mm (ref 3.6–11.0)

## 2014-05-12 LAB — CREATININE, SERUM
CREATININE: 0.78 mg/dL (ref 0.60–1.30)
EGFR (African American): >60
EGFR (Non-African Amer.): >60

## 2014-05-12 LAB — LACTATE DEHYDROGENASE: LDH: 178 U/L (ref 81–246)

## 2014-05-19 DIAGNOSIS — L508 Other urticaria: Secondary | ICD-10-CM | POA: Diagnosis not present

## 2014-05-19 DIAGNOSIS — L989 Disorder of the skin and subcutaneous tissue, unspecified: Secondary | ICD-10-CM | POA: Diagnosis not present

## 2014-05-19 DIAGNOSIS — I10 Essential (primary) hypertension: Secondary | ICD-10-CM | POA: Diagnosis not present

## 2014-05-19 DIAGNOSIS — E669 Obesity, unspecified: Secondary | ICD-10-CM | POA: Diagnosis not present

## 2014-05-30 ENCOUNTER — Ambulatory Visit: Payer: Self-pay | Admitting: Internal Medicine

## 2014-06-05 ENCOUNTER — Encounter: Payer: Self-pay | Admitting: General Surgery

## 2014-06-11 ENCOUNTER — Encounter: Payer: Self-pay | Admitting: General Surgery

## 2014-06-11 DIAGNOSIS — Z923 Personal history of irradiation: Secondary | ICD-10-CM | POA: Diagnosis not present

## 2014-06-11 DIAGNOSIS — Z853 Personal history of malignant neoplasm of breast: Secondary | ICD-10-CM | POA: Diagnosis not present

## 2014-06-11 DIAGNOSIS — C50919 Malignant neoplasm of unspecified site of unspecified female breast: Secondary | ICD-10-CM | POA: Diagnosis not present

## 2014-06-16 DIAGNOSIS — I10 Essential (primary) hypertension: Secondary | ICD-10-CM | POA: Diagnosis not present

## 2014-06-16 DIAGNOSIS — B079 Viral wart, unspecified: Secondary | ICD-10-CM | POA: Diagnosis not present

## 2014-06-29 ENCOUNTER — Ambulatory Visit: Payer: Medicare Other | Admitting: General Surgery

## 2014-07-15 ENCOUNTER — Encounter: Payer: Self-pay | Admitting: General Surgery

## 2014-07-15 ENCOUNTER — Ambulatory Visit (INDEPENDENT_AMBULATORY_CARE_PROVIDER_SITE_OTHER): Payer: Medicare Other | Admitting: General Surgery

## 2014-07-15 VITALS — BP 122/68 | HR 74 | Resp 14 | Ht 66.0 in | Wt 211.0 lb

## 2014-07-15 DIAGNOSIS — Z8571 Personal history of Hodgkin lymphoma: Secondary | ICD-10-CM | POA: Insufficient documentation

## 2014-07-15 DIAGNOSIS — Z853 Personal history of malignant neoplasm of breast: Secondary | ICD-10-CM

## 2014-07-15 DIAGNOSIS — C8190 Hodgkin lymphoma, unspecified, unspecified site: Secondary | ICD-10-CM | POA: Insufficient documentation

## 2014-07-15 NOTE — Progress Notes (Signed)
Patient ID: Mary Todd, female   DOB: September 22, 1946, 68 y.o.   MRN: [id number]  Chief Complaint  Patient presents with  . Follow-up    1 year follow up bilateral diagnostic mammogram    HPI Mary Todd is a 68 y.o. female who presents for a breast cancer follow up. The most recent mammogram was done on 06/11/14. The patient underwent a right breast lumpectomy and sentinel node biopsy in 2011. Patient does perform regular self breast checks and gets regular mammograms done. The patient denies any new problems at this time.   HPI  Past Medical History  Diagnosis Date  . Hypertension 2006  . Hodgkin's disease   . Personal history of malignant neoplasm of breast 2011    right breast  . Esophageal reflux     Past Surgical History  Procedure Laterality Date  . Colonoscopy  2003  . Port a cath revision    . Wrist surgery  2005  . Back surgery    . Abdominal hysterectomy    . Breast surgery Right 2011    right breast lumpectomy,L/SN/R for right breast CA     Family History  Problem Relation Age of Onset  . Cancer Father   . Stroke Brother     Social History History  Substance Use Topics  . Smoking status: Never Smoker   . Smokeless tobacco: Not on file  . Alcohol Use: Yes    No Known Allergies  Current Outpatient Prescriptions  Medication Sig Dispense Refill  . alendronate (FOSAMAX) 70 MG tablet Take 70 mg by mouth every 7 (seven) days. Take with a full glass of water on an empty stomach.      Marland Kitchen amLODipine-olmesartan (AZOR) 5-40 MG per tablet Take 1 tablet by mouth daily.      Marland Kitchen anastrozole (ARIMIDEX) 1 MG tablet Take 1 tablet by mouth daily.      Marland Kitchen CALCIUM-MAGNESIUM PO Take 1 tablet by mouth daily.      . celecoxib (CELEBREX) 200 MG capsule       . cetirizine (ZYRTEC) 10 MG tablet Take 10 mg by mouth daily.      . Cholecalciferol (VITAMIN D-3 PO) Take 1 tablet by mouth daily.      . meloxicam (MOBIC) 15 MG tablet Take 1 tablet by mouth daily.      . metoprolol  succinate (TOPROL-XL) 25 MG 24 hr tablet Take 1 tablet by mouth 2 (two) times daily.      . Multiple Vitamin (MULTIVITAMIN) tablet Take 1 tablet by mouth daily.      . pantoprazole (PROTONIX) 40 MG tablet Take 1 tablet by mouth daily.       No current facility-administered medications for this visit.    Review of Systems Review of Systems  Constitutional: Negative.   Respiratory: Negative.   Cardiovascular: Negative.     Blood pressure 122/68, pulse 74, resp. rate 14, height 5\' 6"  (1.676 m), weight 211 lb (95.709 kg).  Physical Exam Physical Exam  Constitutional: She is oriented to person, place, and time. She appears well-developed and well-nourished.  Eyes: Conjunctivae are normal. No scleral icterus.  Neck: Neck supple. No thyromegaly present.  Cardiovascular: Normal rate, regular rhythm and normal heart sounds.   No murmur heard. Pulmonary/Chest: Effort normal and breath sounds normal. Right breast exhibits no inverted nipple, no mass, no nipple discharge, no skin change and no tenderness. Left breast exhibits no inverted nipple, no mass, no nipple discharge, no skin change and no  tenderness.  Lymphadenopathy:    She has no cervical adenopathy.    She has no axillary adenopathy.  Neurological: She is alert and oriented to person, place, and time.  Skin: Skin is warm and dry.    Data Reviewed  Mammogram reviewed and stable.   Assessment    Exam stable. Pt is 86yrs post right breast lumpectomy,radiation for stage ! CA. Continued on Anastrazole. History of Hodgkins, in remission.    Plan    Patient to return in 1 year with bilateral diagnostic mammogram.        Junie Panning G 07/15/2014, 7:08 PM

## 2014-07-15 NOTE — Patient Instructions (Signed)
Patient to return in 1 year for follow up.Continue self breast exams. Call office for any new breast issues or concerns.  

## 2014-07-20 DIAGNOSIS — M6530 Trigger finger, unspecified finger: Secondary | ICD-10-CM | POA: Diagnosis not present

## 2014-07-20 DIAGNOSIS — M25569 Pain in unspecified knee: Secondary | ICD-10-CM | POA: Diagnosis not present

## 2014-07-20 DIAGNOSIS — E669 Obesity, unspecified: Secondary | ICD-10-CM | POA: Diagnosis not present

## 2014-07-20 DIAGNOSIS — Z23 Encounter for immunization: Secondary | ICD-10-CM | POA: Diagnosis not present

## 2014-07-20 DIAGNOSIS — M1610 Unilateral primary osteoarthritis, unspecified hip: Secondary | ICD-10-CM | POA: Diagnosis not present

## 2014-07-20 DIAGNOSIS — I10 Essential (primary) hypertension: Secondary | ICD-10-CM | POA: Diagnosis not present

## 2014-07-23 DIAGNOSIS — M1710 Unilateral primary osteoarthritis, unspecified knee: Secondary | ICD-10-CM | POA: Diagnosis not present

## 2014-08-31 ENCOUNTER — Encounter: Payer: Self-pay | Admitting: General Surgery

## 2014-09-23 DIAGNOSIS — H2513 Age-related nuclear cataract, bilateral: Secondary | ICD-10-CM | POA: Diagnosis not present

## 2014-11-02 ENCOUNTER — Ambulatory Visit: Payer: Self-pay | Admitting: Internal Medicine

## 2014-11-02 DIAGNOSIS — J984 Other disorders of lung: Secondary | ICD-10-CM | POA: Diagnosis not present

## 2014-11-02 DIAGNOSIS — C8590 Non-Hodgkin lymphoma, unspecified, unspecified site: Secondary | ICD-10-CM | POA: Diagnosis not present

## 2014-11-03 ENCOUNTER — Ambulatory Visit: Payer: Self-pay | Admitting: Internal Medicine

## 2014-11-03 DIAGNOSIS — D589 Hereditary hemolytic anemia, unspecified: Secondary | ICD-10-CM | POA: Diagnosis not present

## 2014-11-03 DIAGNOSIS — Z9221 Personal history of antineoplastic chemotherapy: Secondary | ICD-10-CM | POA: Diagnosis not present

## 2014-11-03 DIAGNOSIS — Z803 Family history of malignant neoplasm of breast: Secondary | ICD-10-CM | POA: Diagnosis not present

## 2014-11-03 DIAGNOSIS — C50919 Malignant neoplasm of unspecified site of unspecified female breast: Secondary | ICD-10-CM | POA: Diagnosis not present

## 2014-11-03 DIAGNOSIS — Z79811 Long term (current) use of aromatase inhibitors: Secondary | ICD-10-CM | POA: Diagnosis not present

## 2014-11-03 DIAGNOSIS — Z8571 Personal history of Hodgkin lymphoma: Secondary | ICD-10-CM | POA: Diagnosis not present

## 2014-11-03 DIAGNOSIS — Z79899 Other long term (current) drug therapy: Secondary | ICD-10-CM | POA: Diagnosis not present

## 2014-11-03 DIAGNOSIS — D72810 Lymphocytopenia: Secondary | ICD-10-CM | POA: Diagnosis not present

## 2014-11-03 DIAGNOSIS — Z170 Estrogen receptor positive status [ER+]: Secondary | ICD-10-CM | POA: Diagnosis not present

## 2014-11-03 DIAGNOSIS — J984 Other disorders of lung: Secondary | ICD-10-CM | POA: Diagnosis not present

## 2014-11-03 DIAGNOSIS — Z800 Family history of malignant neoplasm of digestive organs: Secondary | ICD-10-CM | POA: Diagnosis not present

## 2014-11-03 LAB — CBC CANCER CENTER
Basophil #: 0.1 x10 3/mm (ref 0.0–0.1)
Basophil %: 1.1 %
EOS ABS: 0.2 x10 3/mm (ref 0.0–0.7)
Eosinophil %: 2.7 %
HCT: 40.4 % (ref 35.0–47.0)
HGB: 13.2 g/dL (ref 12.0–16.0)
LYMPHS PCT: 26.8 %
Lymphocyte #: 2 x10 3/mm (ref 1.0–3.6)
MCH: 30 pg (ref 26.0–34.0)
MCHC: 32.8 g/dL (ref 32.0–36.0)
MCV: 91 fL (ref 80–100)
Monocyte #: 0.7 x10 3/mm (ref 0.2–0.9)
Monocyte %: 9.5 %
NEUTROS PCT: 59.9 %
Neutrophil #: 4.4 x10 3/mm (ref 1.4–6.5)
Platelet: 209 x10 3/mm (ref 150–440)
RBC: 4.41 10*6/uL (ref 3.80–5.20)
RDW: 14.1 % (ref 11.5–14.5)
WBC: 7.3 x10 3/mm (ref 3.6–11.0)

## 2014-11-03 LAB — CREATININE, SERUM: Creatinine: 0.78 mg/dL (ref 0.60–1.30)

## 2014-11-03 LAB — LACTATE DEHYDROGENASE: LDH: 192 U/L (ref 81–246)

## 2014-11-23 DIAGNOSIS — I10 Essential (primary) hypertension: Secondary | ICD-10-CM | POA: Diagnosis not present

## 2014-11-30 ENCOUNTER — Ambulatory Visit: Payer: Self-pay | Admitting: Internal Medicine

## 2014-12-28 DIAGNOSIS — H6120 Impacted cerumen, unspecified ear: Secondary | ICD-10-CM | POA: Diagnosis not present

## 2014-12-28 DIAGNOSIS — I10 Essential (primary) hypertension: Secondary | ICD-10-CM | POA: Diagnosis not present

## 2015-02-24 DIAGNOSIS — B078 Other viral warts: Secondary | ICD-10-CM | POA: Diagnosis not present

## 2015-02-24 DIAGNOSIS — Z789 Other specified health status: Secondary | ICD-10-CM | POA: Diagnosis not present

## 2015-02-24 DIAGNOSIS — M71341 Other bursal cyst, right hand: Secondary | ICD-10-CM | POA: Diagnosis not present

## 2015-03-04 DIAGNOSIS — M79641 Pain in right hand: Secondary | ICD-10-CM | POA: Diagnosis not present

## 2015-03-04 DIAGNOSIS — I10 Essential (primary) hypertension: Secondary | ICD-10-CM | POA: Diagnosis not present

## 2015-03-04 DIAGNOSIS — M79642 Pain in left hand: Secondary | ICD-10-CM | POA: Diagnosis not present

## 2015-03-05 ENCOUNTER — Other Ambulatory Visit: Payer: Self-pay | Admitting: Family Medicine

## 2015-03-05 DIAGNOSIS — M79641 Pain in right hand: Secondary | ICD-10-CM

## 2015-03-05 DIAGNOSIS — M79642 Pain in left hand: Secondary | ICD-10-CM

## 2015-03-17 ENCOUNTER — Ambulatory Visit
Admission: RE | Admit: 2015-03-17 | Discharge: 2015-03-17 | Disposition: A | Discharge status: Home / Self Care | Payer: Medicare Other | Source: Ambulatory Visit | Attending: Family Medicine | Admitting: Family Medicine

## 2015-03-17 DIAGNOSIS — M79641 Pain in right hand: Secondary | ICD-10-CM | POA: Insufficient documentation

## 2015-03-17 DIAGNOSIS — M79642 Pain in left hand: Secondary | ICD-10-CM

## 2015-03-17 DIAGNOSIS — M19041 Primary osteoarthritis, right hand: Secondary | ICD-10-CM | POA: Diagnosis not present

## 2015-03-17 DIAGNOSIS — M19042 Primary osteoarthritis, left hand: Secondary | ICD-10-CM | POA: Diagnosis not present

## 2015-03-17 DIAGNOSIS — M8588 Other specified disorders of bone density and structure, other site: Secondary | ICD-10-CM | POA: Diagnosis not present

## 2015-04-06 ENCOUNTER — Inpatient Hospital Stay: Payer: Medicare Other | Attending: Internal Medicine

## 2015-04-06 DIAGNOSIS — C50919 Malignant neoplasm of unspecified site of unspecified female breast: Secondary | ICD-10-CM | POA: Insufficient documentation

## 2015-04-06 DIAGNOSIS — Z170 Estrogen receptor positive status [ER+]: Secondary | ICD-10-CM | POA: Insufficient documentation

## 2015-04-06 DIAGNOSIS — D589 Hereditary hemolytic anemia, unspecified: Secondary | ICD-10-CM | POA: Insufficient documentation

## 2015-04-06 DIAGNOSIS — Z8571 Personal history of Hodgkin lymphoma: Secondary | ICD-10-CM | POA: Insufficient documentation

## 2015-04-06 DIAGNOSIS — Z853 Personal history of malignant neoplasm of breast: Secondary | ICD-10-CM

## 2015-04-06 DIAGNOSIS — Z79811 Long term (current) use of aromatase inhibitors: Secondary | ICD-10-CM | POA: Insufficient documentation

## 2015-04-06 LAB — CBC WITH DIFFERENTIAL/PLATELET
Basophils Absolute: 0.1 10*3/uL (ref 0–0.1)
Basophils Relative: 1 %
Eosinophils Absolute: 0.3 10*3/uL (ref 0–0.7)
Eosinophils Relative: 4 %
HCT: 38.8 % (ref 35.0–47.0)
Hemoglobin: 13 g/dL (ref 12.0–16.0)
LYMPHS ABS: 2.4 10*3/uL (ref 1.0–3.6)
Lymphocytes Relative: 33 %
MCH: 29.7 pg (ref 26.0–34.0)
MCHC: 33.5 g/dL (ref 32.0–36.0)
MCV: 88.4 fL (ref 80.0–100.0)
MONO ABS: 0.6 10*3/uL (ref 0.2–0.9)
Monocytes Relative: 8 %
Neutro Abs: 3.9 10*3/uL (ref 1.4–6.5)
Neutrophils Relative %: 54 %
PLATELETS: 183 10*3/uL (ref 150–440)
RBC: 4.39 MIL/uL (ref 3.80–5.20)
RDW: 14.2 % (ref 11.5–14.5)
WBC: 7.2 10*3/uL (ref 3.6–11.0)

## 2015-04-06 LAB — LACTATE DEHYDROGENASE: LDH: 163 U/L (ref 98–192)

## 2015-04-06 LAB — CREATININE, SERUM: CREATININE: 0.6 mg/dL (ref 0.44–1.00)

## 2015-04-21 ENCOUNTER — Other Ambulatory Visit: Payer: Self-pay | Admitting: Family Medicine

## 2015-05-06 ENCOUNTER — Other Ambulatory Visit: Payer: Self-pay

## 2015-05-10 ENCOUNTER — Other Ambulatory Visit: Payer: Self-pay

## 2015-05-10 DIAGNOSIS — C50911 Malignant neoplasm of unspecified site of right female breast: Secondary | ICD-10-CM

## 2015-05-24 ENCOUNTER — Other Ambulatory Visit: Payer: Self-pay | Admitting: Family Medicine

## 2015-05-26 ENCOUNTER — Telehealth: Payer: Self-pay | Admitting: *Deleted

## 2015-05-26 MED ORDER — ANASTROZOLE 1 MG PO TABS
1.0000 mg | ORAL_TABLET | Freq: Every day | ORAL | Status: DC
Start: 2015-05-26 — End: 2015-08-16

## 2015-05-26 NOTE — Telephone Encounter (Signed)
E scribdew

## 2015-06-14 ENCOUNTER — Other Ambulatory Visit: Payer: Medicare Other

## 2015-06-14 ENCOUNTER — Ambulatory Visit: Admission: RE | Admit: 2015-06-14 | Payer: Medicare Other | Source: Ambulatory Visit

## 2015-06-16 ENCOUNTER — Other Ambulatory Visit: Payer: Self-pay | Admitting: General Surgery

## 2015-06-16 ENCOUNTER — Ambulatory Visit
Admission: RE | Admit: 2015-06-16 | Discharge: 2015-06-16 | Disposition: A | Discharge status: Home / Self Care | Payer: Medicare Other | Source: Ambulatory Visit | Attending: General Surgery | Admitting: General Surgery

## 2015-06-16 ENCOUNTER — Ambulatory Visit: Payer: Medicare Other

## 2015-06-16 DIAGNOSIS — C50911 Malignant neoplasm of unspecified site of right female breast: Secondary | ICD-10-CM

## 2015-06-16 DIAGNOSIS — Z853 Personal history of malignant neoplasm of breast: Secondary | ICD-10-CM | POA: Diagnosis not present

## 2015-06-16 DIAGNOSIS — R928 Other abnormal and inconclusive findings on diagnostic imaging of breast: Secondary | ICD-10-CM | POA: Diagnosis not present

## 2015-06-16 HISTORY — DX: Malignant neoplasm of unspecified site of unspecified female breast: C50.919

## 2015-06-25 ENCOUNTER — Other Ambulatory Visit: Payer: Self-pay | Admitting: Family Medicine

## 2015-06-29 ENCOUNTER — Ambulatory Visit (INDEPENDENT_AMBULATORY_CARE_PROVIDER_SITE_OTHER): Payer: Medicare Other | Admitting: General Surgery

## 2015-06-29 ENCOUNTER — Encounter: Payer: Self-pay | Admitting: General Surgery

## 2015-06-29 VITALS — BP 120/80 | HR 78 | Resp 14 | Ht 65.0 in | Wt 223.0 lb

## 2015-06-29 DIAGNOSIS — Z853 Personal history of malignant neoplasm of breast: Secondary | ICD-10-CM | POA: Diagnosis not present

## 2015-06-29 DIAGNOSIS — Z8571 Personal history of Hodgkin lymphoma: Secondary | ICD-10-CM

## 2015-06-29 NOTE — Patient Instructions (Addendum)
The patient is aware to call back for any questions or concerns. Continue self breast exams. Call office for any new breast issues or concerns. Patient will be asked to return to the office in one year with a bilateral diagnostic mammogram

## 2015-06-29 NOTE — Progress Notes (Signed)
Patient ID: Mary Todd, female   DOB: 1946-10-02, 69 y.o.   MRN: [id number]  Chief Complaint  Patient presents with  . Follow-up    Bilateral Mammogram    HPI Mary Todd is a 69 y.o. female.  who presents for a breast cancer follow up. The most recent mammogram was done on 06/14/2015. The patient underwent a right breast lumpectomy and sentinel node biopsy in 2011. Patient does perform regular self breast checks and gets regular mammograms done. Patient states that her bilateral breasts have been hurting. Patient has not noticed any discharge from breasts. HPI  Past Medical History  Diagnosis Date  . Hypertension 2006  . Personal history of malignant neoplasm of breast 2011    right breast  . Esophageal reflux   . Hodgkin's disease 2004  . Breast cancer right    2011, T1A N0    Past Surgical History  Procedure Laterality Date  . Colonoscopy  2003  . Port a cath revision    . Wrist surgery  2005  . Back surgery    . Abdominal hysterectomy    . Breast surgery Right 2011    right breast lumpectomy,L/SN/R for right breast CA   . Breast biopsy Right 2011    radiation    Family History  Problem Relation Age of Onset  . Cancer Father   . Stroke Brother   . Breast cancer Mother 74    Social History Social History  Substance Use Topics  . Smoking status: Never Smoker   . Smokeless tobacco: None  . Alcohol Use: 0.0 oz/week    0 Standard drinks or equivalent per week     Comment: occas    No Known Allergies  Current Outpatient Prescriptions  Medication Sig Dispense Refill  . alendronate (FOSAMAX) 70 MG tablet Take 70 mg by mouth every 7 (seven) days. Take with a full glass of water on an empty stomach.    Marland Kitchen amLODipine (NORVASC) 5 MG tablet Take 1 tablet by mouth 1 day or 1 dose.  1  . anastrozole (ARIMIDEX) 1 MG tablet Take 1 tablet (1 mg total) by mouth daily. 90 tablet 0  . aspirin 81 MG tablet Take 81 mg by mouth daily.    Marland Kitchen CALCIUM-MAGNESIUM  PO Take 1 tablet by mouth daily.    . cetirizine (ZYRTEC) 10 MG tablet Take 10 mg by mouth daily.    . Cholecalciferol (VITAMIN D-3 PO) Take 1 tablet by mouth daily.    . meloxicam (MOBIC) 15 MG tablet TAKE ONE TABLET BY MOUTH ONCE DAILY 30 tablet 0  . metoprolol succinate (TOPROL-XL) 25 MG 24 hr tablet TAKE ONE TABLET BY MOUTH TWICE DAILY 180 tablet 0  . Multiple Vitamin (MULTIVITAMIN) tablet Take 1 tablet by mouth daily.    . pantoprazole (PROTONIX) 40 MG tablet Take 1 tablet by mouth daily.     No current facility-administered medications for this visit.    Review of Systems Review of Systems  Constitutional: Negative.   Respiratory: Negative.   Cardiovascular: Negative.     Blood pressure 120/80, pulse 78, resp. rate 14, height 5\' 5"  (1.651 m), weight 223 lb (101.152 kg).  Physical Exam Physical Exam  Constitutional: She is oriented to person, place, and time. She appears well-developed and well-nourished.  HENT:  Mouth/Throat: Oropharynx is clear and moist.  Eyes: Conjunctivae are normal. No scleral icterus.  Neck: Neck supple.  Cardiovascular: Normal rate, regular rhythm and normal heart sounds.   Pulmonary/Chest: Effort  normal and breath sounds normal. Right breast exhibits no inverted nipple, no mass, no nipple discharge, no skin change and no tenderness. Left breast exhibits no inverted nipple, no mass, no nipple discharge, no skin change and no tenderness.    Abdominal: Soft. There is no hepatomegaly. There is no tenderness.  Lymphadenopathy:    She has no cervical adenopathy.    She has no axillary adenopathy.  Neurological: She is alert and oriented to person, place, and time.  Skin: Skin is warm and dry.  Psychiatric: Her behavior is normal.    Data Reviewed Mammogram reviewed and stable.  Assessment    Stable physical exam. She has completed 5 years of anastrozole but has not had oncology follow up this year.    Plan    Patient will be asked to return  to the office in one year with a bilateral diagnostic mammogram.  The patient is aware to call back for any questions or concerns.      PCP: Kathlene November 07/01/2015, 11:25 AM

## 2015-07-01 ENCOUNTER — Encounter: Payer: Self-pay | Admitting: General Surgery

## 2015-07-13 ENCOUNTER — Encounter: Payer: Self-pay | Admitting: Oncology

## 2015-07-13 ENCOUNTER — Inpatient Hospital Stay: Payer: Medicare Other | Attending: Oncology | Admitting: Oncology

## 2015-07-13 VITALS — BP 145/96 | HR 85 | Temp 95.3°F | Resp 18 | Wt 221.0 lb

## 2015-07-13 DIAGNOSIS — Z7982 Long term (current) use of aspirin: Secondary | ICD-10-CM | POA: Insufficient documentation

## 2015-07-13 DIAGNOSIS — I10 Essential (primary) hypertension: Secondary | ICD-10-CM | POA: Diagnosis not present

## 2015-07-13 DIAGNOSIS — Z853 Personal history of malignant neoplasm of breast: Secondary | ICD-10-CM

## 2015-07-13 DIAGNOSIS — C8190 Hodgkin lymphoma, unspecified, unspecified site: Secondary | ICD-10-CM | POA: Diagnosis not present

## 2015-07-13 DIAGNOSIS — Z79899 Other long term (current) drug therapy: Secondary | ICD-10-CM | POA: Diagnosis not present

## 2015-07-13 DIAGNOSIS — K219 Gastro-esophageal reflux disease without esophagitis: Secondary | ICD-10-CM | POA: Diagnosis not present

## 2015-07-13 NOTE — Progress Notes (Signed)
[place] @ [facility] Telephone:(336) [phone]  Fax:([phone]) [place]: 04-Sep-1946  MR#: [id number]  TKP#:[id number]  Patient Care Team: Ashok Norris, MD as PCP - General (Family Medicine) Seeplaputhur Robinette Haines, MD (General Surgery)  CHIEF COMPLAINT: No chief complaint on file.   Reason for Visit: This 69 year old Female patient presents to the clinic for Followup Hodgkin's disease history and early stage breast cancer.   Referred by Dr. Rutherford Nail primary care  Dr. Jamal Collin.  DIAGNOSIS:  Chief Complaint/Diagnosis   1.  Pelvic mass, retroperitoneal lymphadenopathy as demonstrated on CT of the abdomen and pelvis, and on PET scan, biopsy proven Hodgkin's disease, treatment plan Rituxan times 6 completed, and ABVD times 12 for 6 cycles, without radiation.  2.  Prior joint pain, muscle aches, and low backache radiating into the buttocks and legs.  Work-up includes MRI of the lumbar spine showing some disc bulge and prior surgery.   3.  Elevated sed rate and high ANA of 320.        4.  Anemia and lymphopenia, negative HIV, CEA, and CA125.    5.  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  6.  Documented iron depletion, endoscopy and colonoscopy prior to initiation of treatment.  7.  Baseline pulmonary disease and impaired DLCO 67% of predicted at baseline, while anemic, later showing baseline 80% predicted when anemia corrected, down to 64% on 12/15, 72% 5/05.  8.  Reactive depression, resolved.  9. Treatment ABVD times 5 through cycle 3-A and then AVD without the Bleomycin times 7 subsequent cycles, completed 3/05.  CT scans and PET scans are negative after six treatments of cycle 3-B. PET abnormal 5/05 and then normal 7/05.  10. Questionable abnormal nuclear scan, possibly reversible ischemia, cath not rec  ECHO nrml. 11. November 2005 PET scan had an abnormality but CT normal. PET and CT normal in 1/06 and 2/06. 12. New equivocal area in the left neck on  PET scan, in 9/06, CT negative. Small nodes neck by CT 11/06, PET negative. CT negative February 2007, PET scan negative March 2007, CT and PET negative 8/07  and 9/07. 13. New parenchymal and old pleural-based density on CT February 2008.  PET scan negative March 2008. Nonspecific and essentially benign appearing. Discussed with Radiology,  followed up in July. Scan was negative and discussed with Radiology. No follow up recommended     14. Breast CA 9/11  18m primary, ER pos HER neg T1 NO MO STAGE 1A ONCOTYPE LOW RISK AND BRCA NEG     INTERVAL HISTORY:  69year old lady came today further follow-up regarding carcinoma of breast. Patient is on anastrozole.  Finishing up 5 years of anastrozole for stage I carcinoma of breast No chills fever appetite has been fairly good. Patient had a cardiac evaluation by Dr. SJamal Collinwith breast examination recently REVIEW OF SYSTEMS:   GENERAL:  Feels good.  Active.  No fevers, sweats or weight loss. PERFORMANCE STATUS (ECOG):  01 HEENT:  No visual changes, runny nose, sore throat, mouth sores or tenderness. Lungs: No shortness of breath or cough.  No hemoptysis. Cardiac:  No chest pain, palpitations, orthopnea, or PND. GI:  No nausea, vomiting, diarrhea, constipation, melena or hematochezia. GU:  No urgency, frequency, dysuria, or hematuria. Musculoskeletal:  No back pain.  No joint pain.  No muscle tenderness. Extremities:  No pain or swelling. Skin:  No rashes or skin changes. Neuro:  No headache, numbness or weakness, balance  or coordination issues. Endocrine:  No diabetes, thyroid issues, hot flashes or night sweats. Psych:  No mood changes, depression or anxiety. Pain:  No focal pain. Review of systems:  All other systems reviewed and found to be negative. As per HPI. Otherwise, a complete review of systems is negatve.  PAST MEDICAL HISTORY: Past Medical History  Diagnosis Date  . Hypertension 2006  . Personal history of malignant neoplasm of  breast 2011    right breast  . Esophageal reflux   . Hodgkin's disease 2004  . Breast cancer right    2011, T1A N0    PAST SURGICAL HISTORY: Past Surgical History  Procedure Laterality Date  . Colonoscopy  2003  . Port a cath revision    . Wrist surgery  2005  . Back surgery    . Abdominal hysterectomy    . Breast surgery Right 2011    right breast lumpectomy,L/SN/R for right breast CA   . Breast biopsy Right 2011    radiation    FAMILY HISTORY Family History  Problem Relation Age of Onset  . Cancer Father   . Stroke Brother   . Breast cancer Mother 67    ADVANCED DIRECTIVES:  No flowsheet data found.  HEALTH MAINTENANCE: Social History  Substance Use Topics  . Smoking status: Never Smoker   . Smokeless tobacco: None  . Alcohol Use: 0.0 oz/week    0 Standard drinks or equivalent per week     Comment: occas      No Known Allergies  Current Outpatient Prescriptions  Medication Sig Dispense Refill  . alendronate (FOSAMAX) 70 MG tablet Take 70 mg by mouth every 7 (seven) days. Take with a full glass of water on an empty stomach.    Marland Kitchen amLODipine (NORVASC) 5 MG tablet Take 1 tablet by mouth 1 day or 1 dose.  1  . anastrozole (ARIMIDEX) 1 MG tablet Take 1 tablet (1 mg total) by mouth daily. 90 tablet 0  . aspirin 81 MG tablet Take 81 mg by mouth daily.    Marland Kitchen CALCIUM-MAGNESIUM PO Take 1 tablet by mouth daily.    . cetirizine (ZYRTEC) 10 MG tablet Take 10 mg by mouth daily.    . Cholecalciferol (VITAMIN D-3 PO) Take 1 tablet by mouth daily.    . meloxicam (MOBIC) 15 MG tablet TAKE ONE TABLET BY MOUTH ONCE DAILY 30 tablet 0  . metoprolol succinate (TOPROL-XL) 25 MG 24 hr tablet TAKE ONE TABLET BY MOUTH TWICE DAILY 180 tablet 0  . Multiple Vitamin (MULTIVITAMIN) tablet Take 1 tablet by mouth daily.    . pantoprazole (PROTONIX) 40 MG tablet Take 1 tablet by mouth daily.     No current facility-administered medications for this visit.    OBJECTIVE:  There were no  vitals filed for this visit.   There is no weight on file to calculate BMI.    ECOG FS:0 - Asymptomatic  PHYSICAL EXAM:  GENERAL:  Well developed, well nourished, sitting comfortably in the exam room in no acute distress. MENTAL STATUS:  Alert and oriented to person, place and time.   RESPIRATORY:  Clear to auscultation without rales, wheezes or rhonchi. CARDIOVASCULAR:  Regular rate and rhythm without murmur, rub or gallop. BREAST:  Not examined as it was recently examined by Dr. Jamal Collin ABDOMEN:  Soft, non-tender, with active bowel sounds, and no hepatosplenomegaly.  No masses. BACK:  No CVA tenderness.  No tenderness on percussion of the back or rib cage. SKIN:  No  rashes, ulcers or lesions. EXTREMITIES: No edema, no skin discoloration or tenderness.  No palpable cords. LYMPH NODES: No palpable cervical, supraclavicular, axillary or inguinal adenopathy  NEUROLOGICAL: Unremarkable. PSYCH:  Appropriate.  LAB RESULTS:  CBC Latest Ref Rng 04/06/2015 11/03/2014  WBC 3.6 - 11.0 K/uL 7.2 7.3  Hemoglobin 12.0 - 16.0 g/dL 13.0 13.2  Hematocrit 35.0 - 47.0 % 38.8 40.4  Platelets 150 - 440 K/uL 183 209    No visits with results within 5 Day(s) from this visit. Latest known visit with results is:  Clinical Support on 04/06/2015  Component Date Value Ref Range Status  . WBC 04/06/2015 7.2  3.6 - 11.0 K/uL Final  . RBC 04/06/2015 4.39  3.80 - 5.20 MIL/uL Final  . Hemoglobin 04/06/2015 13.0  12.0 - 16.0 g/dL Final  . HCT 04/06/2015 38.8  35.0 - 47.0 % Final  . MCV 04/06/2015 88.4  80.0 - 100.0 fL Final  . MCH 04/06/2015 29.7  26.0 - 34.0 pg Final  . MCHC 04/06/2015 33.5  32.0 - 36.0 g/dL Final  . RDW 04/06/2015 14.2  11.5 - 14.5 % Final  . Platelets 04/06/2015 183  150 - 440 K/uL Final  . Neutrophils Relative % 04/06/2015 54   Final  . Neutro Abs 04/06/2015 3.9  1.4 - 6.5 K/uL Final  . Lymphocytes Relative 04/06/2015 33   Final  . Lymphs Abs 04/06/2015 2.4  1.0 - 3.6 K/uL Final  .  Monocytes Relative 04/06/2015 8   Final  . Monocytes Absolute 04/06/2015 0.6  0.2 - 0.9 K/uL Final  . Eosinophils Relative 04/06/2015 4   Final  . Eosinophils Absolute 04/06/2015 0.3  0 - 0.7 K/uL Final  . Basophils Relative 04/06/2015 1   Final  . Basophils Absolute 04/06/2015 0.1  0 - 0.1 K/uL Final  . Creatinine, Ser 04/06/2015 0.60  0.44 - 1.00 mg/dL Final  . GFR calc non Af Amer 04/06/2015 >60  >60 mL/min Final  . GFR calc Af Amer 04/06/2015 >60  >60 mL/min Final   Comment: (NOTE) The eGFR has been calculated using the CKD EPI equation. This calculation has not been validated in all clinical situations. eGFR's persistently <60 mL/min signify possible Chronic Kidney Disease. Performed at [facility]   . [place] 04/06/2015 163  98 - 192 U/L Final   Performed at [place]: [facility] Digital Diagnostic Bil[facility]  06/16/2015   CLINICAL DATA:  History of right breast cancer in 2011 status post breast conservation therapy.  EXAM: DIGITAL DIAGNOSTIC BILATERAL MAMMOGRAM WITH CAD  COMPARISON:  Previous exam(s).  ACR Breast Density Category b: There are scattered areas of fibroglandular density.  FINDINGS: There is stable postsurgical scarring within the right breast. There are no new dominant masses, suspicious calcifications or secondary signs of malignancy identified in either breast.  Mammographic images were processed with CAD.  IMPRESSION: No mammographic evidence of malignancy. Stable postsurgical changes within the right breast.  RECOMMENDATION: Screening mammogram in one year.(Code:SM-B-01Y)  I have discussed the findings and recommendations with the patient. Results were also provided in writing at the conclusion of the visit. If applicable, a reminder letter will be sent to the patient regarding the next appointment.  BI-RADS CATEGORY  2: Benign Finding(s)   Electronically Signed   By: Franki Cabot M.D.   On: 06/16/2015 14:15    ASSESSMENT: Carcinoma of breast T1  N0 M0 tumor patient has finished total 5 years of anti-hormonal therapy Mammogram  is BI-RADS 2 History of Hodgkin's disease in 2008  MEDICAL DECISION MAKING:  We will consider breast cancer index even though tumor is small we want to assess the need for extended anti-hormonal adjuvant therapy If patient does not need any extended anti-hormonal therapy then will be discharged from my care to be followed by Dr. Jamal Collin in patient will stop taking anti-hormonal treatment.  He patient is at high risk for recurrent disease as well as can get benefit with anti-hormonal therapy then will continue to follow this patient  Patient expressed understanding and was in agreement with this plan. She also understands that She can call clinic at any time with any questions, concerns, or complaints.    No matching staging information was found for the patient.  Forest Gleason, MD   07/13/2015 4:09 PM

## 2015-07-20 ENCOUNTER — Encounter: Payer: Self-pay | Admitting: Oncology

## 2015-07-20 DIAGNOSIS — C50911 Malignant neoplasm of unspecified site of right female breast: Secondary | ICD-10-CM | POA: Diagnosis not present

## 2015-07-31 ENCOUNTER — Ambulatory Visit
Admission: EM | Admit: 2015-07-31 | Discharge: 2015-07-31 | Disposition: A | Discharge status: Home / Self Care | Payer: Medicare Other | Attending: Internal Medicine | Admitting: Internal Medicine

## 2015-07-31 DIAGNOSIS — B349 Viral infection, unspecified: Secondary | ICD-10-CM | POA: Diagnosis not present

## 2015-07-31 DIAGNOSIS — J22 Unspecified acute lower respiratory infection: Secondary | ICD-10-CM

## 2015-07-31 MED ORDER — BENZONATATE 200 MG PO CAPS: 200.0000 mg | ORAL_CAPSULE | Freq: Three times a day (TID) | ORAL | Status: DC | PRN

## 2015-07-31 NOTE — Discharge Instructions (Signed)
Probably viral respiratory illness, rest and push fluids. Recheck for new fever >100.5, increasing phlegm production, or if not starting to improve in a few days. Prescription for tessalon (benzonatate) perles for cough sent to the pharmacy. Anticipate gradual improvement in cough over the next 2-3 weeks.

## 2015-07-31 NOTE — ED Notes (Signed)
Chest congestion, cough, body aches starting yesterday. Has been taking dayquil for her symptoms.

## 2015-07-31 NOTE — ED Provider Notes (Signed)
CSN: [id number]     Arrival date & time 07/31/15  1057 History   First MD Initiated Contact with Patient 07/31/15 1151     Chief Complaint  Patient presents with  . URI   Patient is a 69 y.o. female presenting with URI.  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 She presents with the onset of malaise, fatigue, and general achiness yesterday. Frequent dry cough, feeling of chest tightness. Headache. No fever. No runny/congested nose, no sore throat. No nausea/vomiting. Little bit of diarrhea this morning. Central chest burning with cough. Never smoker  Past Medical History  Diagnosis Date  . Hypertension 2006  . Personal history of malignant neoplasm of breast 2011    right breast  . Esophageal reflux   . Hodgkin's disease 2004  . Breast cancer right    2011, T1A N0   Past Surgical History  Procedure Laterality Date  . Colonoscopy  2003  . Port a cath revision    . Wrist surgery  2005  . Back surgery    . Abdominal hysterectomy    . Breast surgery Right 2011    right breast lumpectomy,L/SN/R for right breast CA   . Breast biopsy Right 2011    radiation   Family History  Problem Relation Age of Onset  . Cancer Father   . Stroke Brother   . Breast cancer Mother 60   Social History  Substance Use Topics  . Smoking status: Never Smoker   . Smokeless tobacco: Not on file  . Alcohol Use: 0.0 oz/week    0 Standard drinks or equivalent per week     Comment: occas   OB History    Gravida Para Term Preterm AB TAB SAB Ectopic Multiple Living   2 2        2       Obstetric Comments   1st Menstrual Cycle:  11 1st Pregnancy:  22.     Review of Systems  All other systems reviewed and are negative.   Allergies  Review of patient's allergies indicates no known allergies.  Home Medications   Prior to Admission medications   Medication Sig Start Date End Date Taking? Authorizing Provider  alendronate (FOSAMAX) 70 MG tablet Take 70 mg  by mouth every 7 (seven) days. Take with a full glass of water on an empty stomach.    Historical Provider, MD  amLODipine (NORVASC) 5 MG tablet Take 1 tablet by mouth 1 day or 1 dose. 04/21/15   Historical Provider, MD  anastrozole (ARIMIDEX) 1 MG tablet Take 1 tablet (1 mg total) by mouth daily. 05/26/15   Evlyn Kanner, NP  aspirin 81 MG tablet Take 81 mg by mouth daily.    Historical Provider, MD  benzonatate (TESSALON) 200 MG capsule Take 1 capsule (200 mg total) by mouth 3 (three) times daily as needed for cough. 07/31/15   Sherlene Shams, MD  CALCIUM-MAGNESIUM PO Take 1 tablet by mouth daily.    Historical Provider, MD  cetirizine (ZYRTEC) 10 MG tablet Take 10 mg by mouth daily.    Historical Provider, MD  Cholecalciferol (VITAMIN D-3 PO) Take 1 tablet by mouth daily.    Historical Provider, MD  meloxicam (MOBIC) 15 MG tablet TAKE ONE TABLET BY MOUTH ONCE DAILY 06/25/15   Ashok Norris, MD  metoprolol succinate (TOPROL-XL) 25 MG 24 hr tablet TAKE ONE TABLET BY MOUTH TWICE DAILY 05/24/15   Ashok Norris, MD  Multiple  Vitamin (MULTIVITAMIN) tablet Take 1 tablet by mouth daily.    Historical Provider, MD  pantoprazole (PROTONIX) 40 MG tablet Take 1 tablet by mouth daily. 05/20/13   Historical Provider, MD   Meds Ordered and Administered this Visit  Medications - No data to display  BP 111/86 mmHg  Pulse 90  Temp(Src) 98.4 F (36.9 C) (Oral)  Ht 5\' 5"  (1.651 m)  Wt 217 lb (98.431 kg)  BMI 36.11 kg/m2  SpO2 100% No data found.   Physical Exam  Constitutional: She is oriented to person, place, and time. No distress.  Alert, nicely groomed Sitting up on the end of the exam table, looks ill but not toxic  HENT:  Head: Atraumatic.  Bilateral TMs mildly dull, no erythema Moderate nasal congestion Throat is a little bit red   Eyes:  Conjugate gaze, no eye redness/drainage  Neck: Neck supple.  Cardiovascular: Normal rate and regular rhythm.   Pulmonary/Chest: No respiratory  distress. She has no wheezes. She has no rales.  Slightly coarse but symmetric breath sounds throughout  Abdominal: She exhibits no distension.  Musculoskeletal: Normal range of motion.  No leg swelling  Neurological: She is alert and oriented to person, place, and time.  Skin: Skin is warm and dry.  No cyanosis  Nursing note and vitals reviewed.   ED Course  Procedures (including critical care time)   MDM   1. Nonspecific syndrome suggestive of viral illness   2. Acute respiratory infection    Discharge Medication List as of 07/31/2015 12:00 PM    START taking these medications   Details  benzonatate (TESSALON) 200 MG capsule Take 1 capsule (200 mg total) by mouth 3 (three) times daily as needed for cough., Starting 07/31/2015, Until Discontinued, Normal       Recheck for fever greater then 100.5, increasing phlegm production, or if not starting to improve in a few days. Anticipate gradual improvement in cough over the next 2-3 weeks. Above prescription sent to the [facility] in [place].    Sherlene Shams, MD 07/31/15 [phone]

## 2015-08-03 ENCOUNTER — Other Ambulatory Visit: Payer: Self-pay | Admitting: Family Medicine

## 2015-08-04 ENCOUNTER — Ambulatory Visit
Admission: RE | Admit: 2015-08-04 | Discharge: 2015-08-04 | Disposition: A | Discharge status: Home / Self Care | Payer: Medicare Other | Source: Ambulatory Visit | Attending: Family Medicine | Admitting: Family Medicine

## 2015-08-04 ENCOUNTER — Encounter: Payer: Self-pay | Admitting: Family Medicine

## 2015-08-04 ENCOUNTER — Ambulatory Visit (INDEPENDENT_AMBULATORY_CARE_PROVIDER_SITE_OTHER): Payer: Medicare Other | Admitting: Family Medicine

## 2015-08-04 VITALS — BP 130/70 | HR 104 | Temp 98.2°F | Resp 18 | Ht 65.0 in | Wt 220.3 lb

## 2015-08-04 DIAGNOSIS — J9811 Atelectasis: Secondary | ICD-10-CM | POA: Insufficient documentation

## 2015-08-04 DIAGNOSIS — R05 Cough: Secondary | ICD-10-CM | POA: Diagnosis present

## 2015-08-04 DIAGNOSIS — J40 Bronchitis, not specified as acute or chronic: Secondary | ICD-10-CM

## 2015-08-04 DIAGNOSIS — J209 Acute bronchitis, unspecified: Secondary | ICD-10-CM

## 2015-08-04 MED ORDER — HYDROCOD POLST-CPM POLST ER 10-8 MG/5ML PO SUER: 5.0000 mL | Freq: Two times a day (BID) | ORAL | Status: DC | PRN

## 2015-08-04 MED ORDER — LEVOFLOXACIN 500 MG PO TABS: 500.0000 mg | ORAL_TABLET | Freq: Every day | ORAL | Status: DC

## 2015-08-04 MED ORDER — PREDNISONE 10 MG PO TABS: 10.0000 mg | ORAL_TABLET | Freq: Two times a day (BID) | ORAL | Status: DC

## 2015-08-04 NOTE — Progress Notes (Signed)
Name: Mary Todd   MRN: [id number]    DOB: [birth year]   Date:08/04/2015       Progress Note  Subjective  Chief Complaint  Chief Complaint  Patient presents with  . URI    seen at [facility] on Saturday going on for 1 week    HPI  Patient is here today with concerns regarding the following symptoms congestion, post nasal drip, ear pressure, sinus pressure, productive cough and achiness that started 2 weeks ago. Was seen at [facility] diagnosed with URI but she reports symptoms are worsening. Associated with chills, fatigue and anorexia. Not associated with rash, recent travel, change in mentation. Has tried the following home remedies: Cold and sinus OTC remedies, tessalon perls given by UC 1 week ago.   Past Medical History  Diagnosis Date  . Hypertension 2006  . Personal history of malignant neoplasm of breast 2011    right breast  . Esophageal reflux   . Hodgkin's disease ([place]) 2004  . Breast cancer ([place]) right    2011, T1A N0    Social History  Substance Use Topics  . Smoking status: Never Smoker   . Smokeless tobacco: Not on file  . Alcohol Use: 0.0 oz/week    0 Standard drinks or equivalent per week     Comment: occas     Current outpatient prescriptions:  .  alendronate (FOSAMAX) 70 MG tablet, Take 70 mg by mouth every 7 (seven) days. Take with a full glass of water on an empty stomach., Disp: , Rfl:  .  amLODipine (NORVASC) 5 MG tablet, Take 1 tablet by mouth 1 day or 1 dose., Disp: , Rfl: 1 .  anastrozole (ARIMIDEX) 1 MG tablet, Take 1 tablet (1 mg total) by mouth daily., Disp: 90 tablet, Rfl: 0 .  aspirin 81 MG tablet, Take 81 mg by mouth daily., Disp: , Rfl:  .  benzonatate (TESSALON) 200 MG capsule, Take 1 capsule (200 mg total) by mouth 3 (three) times daily as needed for cough., Disp: 30 capsule, Rfl: 1 .  CALCIUM-MAGNESIUM PO, Take 1 tablet by mouth daily., Disp: , Rfl:  .  cetirizine (ZYRTEC) 10 MG tablet, Take 10 mg by mouth daily., Disp: , Rfl:  .   Cholecalciferol (VITAMIN D-3 PO), Take 1 tablet by mouth daily., Disp: , Rfl:  .  meloxicam (MOBIC) 15 MG tablet, TAKE ONE TABLET BY MOUTH ONCE DAILY, Disp: 30 tablet, Rfl: 0 .  metoprolol succinate (TOPROL-XL) 25 MG 24 hr tablet, TAKE ONE TABLET BY MOUTH TWICE DAILY, Disp: 180 tablet, Rfl: 0 .  Multiple Vitamin (MULTIVITAMIN) tablet, Take 1 tablet by mouth daily., Disp: , Rfl:  .  pantoprazole (PROTONIX) 40 MG tablet, Take 1 tablet by mouth daily., Disp: , Rfl:   No Known Allergies  ROS  Positive for fatigue, nasal congestion, sinus pressure, ear fullness, cough, chest tightness as mentioned in HPI, otherwise all systems reviewed and are negative.  Objective  Filed Vitals:   08/04/15 1058  BP: 130/70  Pulse: 104  Temp: 98.2 F (36.8 C)  TempSrc: Oral  Resp: 18  Height: 5\' 5"  (1.651 m)  Weight: 220 lb 4.8 oz (99.927 kg)  SpO2: 92%   Body mass index is 36.66 kg/(m^2).   Physical Exam  Constitutional: Patient is obese and well-nourished. In no acute distress but does appear to be fatigued from acute illness. HEENT:  - Head: Normocephalic and atraumatic.  - Ears: RIGHT TM bulging with minimal clear exudate, LEFT TM bulging  with minimal clear exudate.  - Nose: Nasal mucosa boggy and congested.  - Mouth/Throat: Oropharynx is moist with slight erythema of bilateral tonsils without hypertrophy or exudates. Post nasal drainage present.  - Eyes: Conjunctivae clear, EOM movements normal. PERRLA. No scleral icterus.  Neck: Normal range of motion. Neck supple. No JVD present. No thyromegaly present. No local lymphadenopathy. Cardiovascular: Regular rate, regular rhythm with no murmurs heard.  Pulmonary/Chest: Effort normal and breath sounds normal tidal volume however scattered rhonchi with right lower quadrant rales.   Musculoskeletal: Normal range of motion bilateral UE and LE, no joint effusions. Skin: Skin is warm and dry. No rash noted. Psychiatric: Patient has a normal mood and  affect. Behavior is normal in office today. Judgment and thought content normal in office today.   Assessment & Plan  1. Bronchitis with bronchospasm Will get CXR to investigate for PNA and or effusion based on clinical findings. Pulse ox low at 92% for patient. Etiologies likely initial viral infection with now superimposed bacterial infection. Instructed patient on increasing hydration, nasal saline spray, steam inhalation, NSAID if tolerated and not contraindicated. Start antibiotic and prednisone therapy.  - levofloxacin (LEVAQUIN) 500 MG tablet; Take 1 tablet (500 mg total) by mouth daily.  Dispense: 7 tablet; Refill: 0 - chlorpheniramine-HYDROcodone (TUSSIONEX PENNKINETIC ER) 10-8 MG/5ML SUER; Take 5 mLs by mouth every 12 (twelve) hours as needed for cough.  Dispense: 100 mL; Refill: 0 - predniSONE (DELTASONE) 10 MG tablet; Take 1 tablet (10 mg total) by mouth 2 (two) times daily with a meal.  Dispense: 10 tablet; Refill: 0 - DG Chest 2 View; Future

## 2015-08-09 ENCOUNTER — Telehealth: Payer: Self-pay | Admitting: Family Medicine

## 2015-08-09 NOTE — Telephone Encounter (Signed)
PT SAID THAT DR TOLD HER TO CALL BACK TO GET REFILL IF STILL WAS NOT BETTER. SHE  HAS TAKEN ALL HER MEDICATION  STILL HAS COUGH, CHEST TIGHTNESS. PHARM IS [facility].

## 2015-08-10 ENCOUNTER — Other Ambulatory Visit: Payer: Self-pay | Admitting: Family Medicine

## 2015-08-10 DIAGNOSIS — J209 Acute bronchitis, unspecified: Secondary | ICD-10-CM

## 2015-08-10 MED ORDER — LEVOFLOXACIN 750 MG PO TABS: 750.0000 mg | ORAL_TABLET | Freq: Every day | ORAL | Status: DC

## 2015-08-10 NOTE — Telephone Encounter (Signed)
Please let Mrs Pundt know that I have sent in Levofloxacin 750 mg (higher dose, previously 500 mg) for another 7 days. I want her to follow up with Dr. Rutherford Nail in 1 week to recheck her abnormal chest xray.

## 2015-08-10 NOTE — Telephone Encounter (Signed)
I informed and has made appointment with Dr. Rutherford Nail

## 2015-08-16 ENCOUNTER — Inpatient Hospital Stay: Payer: Medicare Other | Attending: Oncology | Admitting: Oncology

## 2015-08-16 ENCOUNTER — Encounter: Payer: Self-pay | Admitting: Oncology

## 2015-08-16 VITALS — BP 137/87 | HR 85 | Temp 96.3°F | Wt 220.5 lb

## 2015-08-16 DIAGNOSIS — Z853 Personal history of malignant neoplasm of breast: Secondary | ICD-10-CM | POA: Diagnosis not present

## 2015-08-16 DIAGNOSIS — I10 Essential (primary) hypertension: Secondary | ICD-10-CM | POA: Diagnosis not present

## 2015-08-16 DIAGNOSIS — K219 Gastro-esophageal reflux disease without esophagitis: Secondary | ICD-10-CM | POA: Diagnosis not present

## 2015-08-16 DIAGNOSIS — Z803 Family history of malignant neoplasm of breast: Secondary | ICD-10-CM | POA: Diagnosis not present

## 2015-08-16 DIAGNOSIS — Z9221 Personal history of antineoplastic chemotherapy: Secondary | ICD-10-CM | POA: Diagnosis not present

## 2015-08-16 DIAGNOSIS — Z9223 Personal history of estrogen therapy: Secondary | ICD-10-CM | POA: Insufficient documentation

## 2015-08-16 DIAGNOSIS — M1990 Unspecified osteoarthritis, unspecified site: Secondary | ICD-10-CM

## 2015-08-16 DIAGNOSIS — Z79899 Other long term (current) drug therapy: Secondary | ICD-10-CM

## 2015-08-16 DIAGNOSIS — Z9189 Other specified personal risk factors, not elsewhere classified: Secondary | ICD-10-CM

## 2015-08-16 DIAGNOSIS — C8198 Hodgkin lymphoma, unspecified, lymph nodes of multiple sites: Secondary | ICD-10-CM | POA: Diagnosis not present

## 2015-08-16 DIAGNOSIS — Z7982 Long term (current) use of aspirin: Secondary | ICD-10-CM | POA: Insufficient documentation

## 2015-08-16 DIAGNOSIS — Z7952 Long term (current) use of systemic steroids: Secondary | ICD-10-CM | POA: Insufficient documentation

## 2015-08-16 DIAGNOSIS — M8580 Other specified disorders of bone density and structure, unspecified site: Secondary | ICD-10-CM

## 2015-08-16 MED ORDER — ANASTROZOLE 1 MG PO TABS: 1.0000 mg | ORAL_TABLET | Freq: Every day | ORAL | Status: DC

## 2015-08-16 NOTE — Progress Notes (Signed)
Patient states she has had pneumonia and is currently on Levaquin.

## 2015-08-16 NOTE — Progress Notes (Signed)
[place] @ [facility] [phone]  Fax:([phone]) [place]: 07-06-1946  MR#: [id number]  STM#:[id number]  Patient Care Team: Ashok Norris, MD as PCP - General (Family Medicine) Seeplaputhur Robinette Haines, MD (General Surgery)  CHIEF COMPLAINT:  Chief Complaint  Patient presents with  . OTHER    Reason for Visit: This 69 year old Female patient presents to the clinic for Followup Hodgkin's disease history and early stage breast cancer.   Referred by Dr. Rutherford Nail primary care  Dr. Jamal Collin.  DIAGNOSIS:  Chief Complaint/Diagnosis   1.  Pelvic mass, retroperitoneal lymphadenopathy as demonstrated on CT of the abdomen and pelvis, and on PET scan, biopsy proven Hodgkin's disease, treatment plan Rituxan times 6 completed, and ABVD times 12 for 6 cycles, without radiation.  2.  Prior joint pain, muscle aches, and low backache radiating into the buttocks and legs.  Work-up includes MRI of the lumbar spine showing some disc bulge and prior surgery.   3.  Elevated sed rate and high ANA of 320.        4.  Anemia and lymphopenia, negative HIV, CEA, and CA125.    5.  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  6.  Documented iron depletion, endoscopy and colonoscopy prior to initiation of treatment.  7.  Baseline pulmonary disease and impaired DLCO 67% of predicted at baseline, while anemic, later showing baseline 80% predicted when anemia corrected, down to 64% on 12/15, 72% 5/05.  8.  Reactive depression, resolved.  9. Treatment ABVD times 5 through cycle 3-A and then AVD without the Bleomycin times 7 subsequent cycles, completed 3/05.  CT scans and PET scans are negative after six treatments of cycle 3-B. PET abnormal 5/05 and then normal 7/05.  10. Questionable abnormal nuclear scan, possibly reversible ischemia, cath not rec  ECHO nrml. 11. November 2005 PET scan had an abnormality but CT normal. PET and CT normal in 1/06 and 2/06. 12. New equivocal  area in the left neck on PET scan, in 9/06, CT negative. Small nodes neck by CT 11/06, PET negative. CT negative February 2007, PET scan negative March 2007, CT and PET negative 8/07  and 9/07. 13. New parenchymal and old pleural-based density on CT February 2008.  PET scan negative March 2008. Nonspecific and essentially benign appearing. Discussed with Radiology,  followed up in July. Scan was negative and discussed with Radiology. No follow up recommended     14. Breast CA 9/11  47m primary, ER pos HER neg T1 NO MO STAGE 1A ONCOTYPE LOW RISK AND BRCA NEG.  14 patient has finished 5 years of aromatase inhibitor therapy.  Patient underwent evaluation with breast cancer index analysis which revealed 6.3% risk of late recurrent disease.  And patient had a highly likelihood of benefit from continuing anti-hormonal therapy by approximately 30% reduction Continue anastrozole therapy for 5 more years      INTERVAL HISTORY:  69year old lady came today further follow-up regarding carcinoma of breast. Patient is on anastrozole.  Finishing up 5 years of anastrozole for stage I carcinoma of breast No chills fever appetite has been fairly good. Patient had a cardiac evaluation by Dr. SJamal Collinwith breast examination recently  Recent is here to discuss the results of breast cancer index.   REVIEW OF SYSTEMS:   GENERAL:  Feels good.  Active.  No fevers, sweats or weight loss. PERFORMANCE STATUS (ECOG):  01 HEENT:  No visual changes, runny nose, sore throat, mouth sores  or tenderness. Lungs: No shortness of breath or cough.  No hemoptysis. Cardiac:  No chest pain, palpitations, orthopnea, or PND. GI:  No nausea, vomiting, diarrhea, constipation, melena or hematochezia. GU:  No urgency, frequency, dysuria, or hematuria. Musculoskeletal:  No back pain.  No joint pain.  No muscle tenderness. Extremities:  No pain or swelling. Skin:  No rashes or skin changes. Neuro:  No headache, numbness or weakness,  balance or coordination issues. Endocrine:  No diabetes, thyroid issues, hot flashes or night sweats. Psych:  No mood changes, depression or anxiety. Pain:  No focal pain. Review of systems:  All other systems reviewed and found to be negative. As per HPI. Otherwise, a complete review of systems is negatve.  PAST MEDICAL HISTORY: Past Medical History  Diagnosis Date  . Hypertension 2006  . Personal history of malignant neoplasm of breast 2011    right breast  . Esophageal reflux   . Hodgkin's disease ([place]) 2004  . Breast cancer ([place]) right    2011, T1A N0    PAST SURGICAL HISTORY: Past Surgical History  Procedure Laterality Date  . Colonoscopy  2003  . Port a cath revision    . Wrist surgery  2005  . Back surgery    . Abdominal hysterectomy    . Breast surgery Right 2011    right breast lumpectomy,L/SN/R for right breast CA   . Breast biopsy Right 2011    radiation    FAMILY HISTORY Family History  Problem Relation Age of Onset  . Cancer Father   . Stroke Brother   . Breast cancer Mother 24    ADVANCED DIRECTIVES:  No flowsheet data found.  HEALTH MAINTENANCE: Social History  Substance Use Topics  . Smoking status: Never Smoker   . Smokeless tobacco: Not on file  . Alcohol Use: 0.0 oz/week    0 Standard drinks or equivalent per week     Comment: occas      Allergies  Allergen Reactions  . Augmentin [Amoxicillin-Pot Clavulanate] Nausea Only    Current Outpatient Prescriptions  Medication Sig Dispense Refill  . alendronate (FOSAMAX) 70 MG tablet Take 70 mg by mouth every 7 (seven) days. Take with a full glass of water on an empty stomach.    Marland Kitchen amLODipine (NORVASC) 5 MG tablet Take 1 tablet by mouth 1 day or 1 dose.  1  . anastrozole (ARIMIDEX) 1 MG tablet Take 1 tablet (1 mg total) by mouth daily. 90 tablet 0  . aspirin 81 MG tablet Take 81 mg by mouth daily.    . benzonatate (TESSALON) 200 MG capsule Take 1 capsule (200 mg total) by mouth 3 (three)  times daily as needed for cough. 30 capsule 1  . CALCIUM-MAGNESIUM PO Take 1 tablet by mouth daily.    . cetirizine (ZYRTEC) 10 MG tablet Take 10 mg by mouth daily.    . chlorpheniramine-HYDROcodone (TUSSIONEX PENNKINETIC ER) 10-8 MG/5ML SUER Take 5 mLs by mouth every 12 (twelve) hours as needed for cough. 100 mL 0  . Cholecalciferol (VITAMIN D-3 PO) Take 1 tablet by mouth daily.    Marland Kitchen levofloxacin (LEVAQUIN) 750 MG tablet Take 1 tablet (750 mg total) by mouth daily. 7 tablet 0  . meloxicam (MOBIC) 15 MG tablet TAKE ONE TABLET BY MOUTH ONCE DAILY 30 tablet 0  . metoprolol succinate (TOPROL-XL) 25 MG 24 hr tablet TAKE ONE TABLET BY MOUTH TWICE DAILY 180 tablet 0  . Multiple Vitamin (MULTIVITAMIN) tablet Take 1 tablet by mouth  daily.    . pantoprazole (PROTONIX) 40 MG tablet Take 1 tablet by mouth daily.    Marland Kitchen levofloxacin (LEVAQUIN) 500 MG tablet   0  . predniSONE (DELTASONE) 10 MG tablet Take 1 tablet (10 mg total) by mouth 2 (two) times daily with a meal. 10 tablet 0   No current facility-administered medications for this visit.    OBJECTIVE:  Filed Vitals:   08/16/15 1206  BP: 137/87  Pulse: 85  Temp: 96.3 F (35.7 C)     Body mass index is 36.69 kg/(m^2).    ECOG FS:0 - Asymptomatic  PHYSICAL EXAM:  GENERAL:  Well developed, well nourished, sitting comfortably in the exam room in no acute distress. MENTAL STATUS:  Alert and oriented to person, place and time.   RESPIRATORY:  Clear to auscultation without rales, wheezes or rhonchi. CARDIOVASCULAR:  Regular rate and rhythm without murmur, rub or gallop. BREAST:  Not examined as it was recently examined by Dr. Jamal Collin ABDOMEN:  Soft, non-tender, with active bowel sounds, and no hepatosplenomegaly.  No masses. BACK:  No CVA tenderness.  No tenderness on percussion of the back or rib cage. SKIN:  No rashes, ulcers or lesions. EXTREMITIES: No edema, no skin discoloration or tenderness.  No palpable cords. LYMPH NODES: No palpable  cervical, supraclavicular, axillary or inguinal adenopathy  NEUROLOGICAL: Unremarkable. PSYCH:  Appropriate.  LAB RESULTS:  CBC Latest Ref Rng 04/06/2015 11/03/2014  WBC 3.6 - 11.0 K/uL 7.2 7.3  Hemoglobin 12.0 - 16.0 g/dL 13.0 13.2  Hematocrit 35.0 - 47.0 % 38.8 40.4  Platelets 150 - 440 K/uL 183 209    No visits with results within 5 Day(s) from this visit. Latest known visit with results is:  Clinical Support on 04/06/2015  Component Date Value Ref Range Status  . WBC 04/06/2015 7.2  3.6 - 11.0 K/uL Final  . RBC 04/06/2015 4.39  3.80 - 5.20 MIL/uL Final  . Hemoglobin 04/06/2015 13.0  12.0 - 16.0 g/dL Final  . HCT 04/06/2015 38.8  35.0 - 47.0 % Final  . MCV 04/06/2015 88.4  80.0 - 100.0 fL Final  . MCH 04/06/2015 29.7  26.0 - 34.0 pg Final  . MCHC 04/06/2015 33.5  32.0 - 36.0 g/dL Final  . RDW 04/06/2015 14.2  11.5 - 14.5 % Final  . Platelets 04/06/2015 183  150 - 440 K/uL Final  . Neutrophils Relative % 04/06/2015 54   Final  . Neutro Abs 04/06/2015 3.9  1.4 - 6.5 K/uL Final  . Lymphocytes Relative 04/06/2015 33   Final  . Lymphs Abs 04/06/2015 2.4  1.0 - 3.6 K/uL Final  . Monocytes Relative 04/06/2015 8   Final  . Monocytes Absolute 04/06/2015 0.6  0.2 - 0.9 K/uL Final  . Eosinophils Relative 04/06/2015 4   Final  . Eosinophils Absolute 04/06/2015 0.3  0 - 0.7 K/uL Final  . Basophils Relative 04/06/2015 1   Final  . Basophils Absolute 04/06/2015 0.1  0 - 0.1 K/uL Final  . Creatinine, Ser 04/06/2015 0.60  0.44 - 1.00 mg/dL Final  . GFR calc non Af Amer 04/06/2015 >60  >60 mL/min Final  . GFR calc Af Amer 04/06/2015 >60  >60 mL/min Final   Comment: (NOTE) The eGFR has been calculated using the CKD EPI equation. This calculation has not been validated in all clinical situations. eGFR's persistently <60 mL/min signify possible Chronic Kidney Disease. Performed at [facility]   . LDH 04/06/2015 163  98 - 192 U/L Final  Performed at [facility]        STUDIES: Dg Chest 2 View  08/04/2015  CLINICAL DATA:  Productive cough. EXAM: CHEST  2 VIEW COMPARISON:  11/02/2014. FINDINGS: Mediastinum hilar structures are normal. Bibasilar atelectasis and/or infiltrates. No pleural effusion or pneumothorax. Borderline cardiomegaly. Normal pulmonary vascularity. No acute bony abnormality. IMPRESSION: Bibasilar atelectasis and/or infiltrates. Electronically Signed   By: Marcello Moores  Register   On: 08/04/2015 12:18    ASSESSMENT: Carcinoma of breast T1 N0 M0 tumor patient has finished total 5 years of anti-hormonal therapy Mammogram is BI-RADS 2 History of Hodgkin's disease in 2008 Breast cancer index shows 6.3% chance of late recurrence disease and likelihood of benefit from anti-hormonal therapy with 16.5% absolute benefit.  Patient is tolerating treatment very well.  No bony pains.  Patient was advised to continue anastrozole.  Calcium vitamin D.  Repeat another bone density study next year along with mammogram. Patient understood rationale A for continuing treatment She is  agreeable to continue extended adjuvant therapy Total duration of visit was49mnutes.  50% or more time was spent in counseling patient and family regarding prognosis and options of treatment and available resources    No matching staging information was found for the patient.  JForest Gleason MD   08/16/2015 12:10 PM

## 2015-08-17 ENCOUNTER — Encounter: Payer: Self-pay | Admitting: Family Medicine

## 2015-08-17 ENCOUNTER — Ambulatory Visit
Admission: RE | Admit: 2015-08-17 | Discharge: 2015-08-17 | Disposition: A | Discharge status: Home / Self Care | Payer: Medicare Other | Source: Ambulatory Visit | Attending: Family Medicine | Admitting: Family Medicine

## 2015-08-17 ENCOUNTER — Ambulatory Visit (INDEPENDENT_AMBULATORY_CARE_PROVIDER_SITE_OTHER): Payer: Medicare Other | Admitting: Family Medicine

## 2015-08-17 VITALS — BP 120/70 | HR 97 | Temp 98.9°F | Resp 16 | Ht 65.0 in | Wt 220.5 lb

## 2015-08-17 DIAGNOSIS — R059 Cough, unspecified: Secondary | ICD-10-CM

## 2015-08-17 DIAGNOSIS — R05 Cough: Secondary | ICD-10-CM

## 2015-08-17 DIAGNOSIS — Z8579 Personal history of other malignant neoplasms of lymphoid, hematopoietic and related tissues: Secondary | ICD-10-CM

## 2015-08-17 DIAGNOSIS — J189 Pneumonia, unspecified organism: Secondary | ICD-10-CM

## 2015-08-17 MED ORDER — TRAMADOL HCL 50 MG PO TABS: 50.0000 mg | ORAL_TABLET | Freq: Three times a day (TID) | ORAL | Status: DC | PRN

## 2015-08-17 NOTE — Progress Notes (Signed)
Name: Mary Todd   MRN: [id number]    DOB: 10/01/1946   Date:08/17/2015       Progress Note  Subjective  Chief Complaint  Chief Complaint  Patient presents with  . Pneumonia    1 week follow up    HPI  Pneumonia follow-up  Patient was treated with 10 days levofloxacin pneumonia. Chest x-ray showed infiltrate or atelectasisthe patient and purulent sputum production with fever and chills consistent. She is back for recheck. Has persistent, as outlined below  Cough  Patient has persistent numbness productive cough and having come to the course of Levaquin for a period there's been no hemoptysis. She continues to have some fatigue fever or chills.she feels that there is a dry irritated trachea.  Past Medical History  Diagnosis Date  . Hypertension 2006  . Personal history of malignant neoplasm of breast 2011    right breast  . Esophageal reflux   . Hodgkin's disease ([place]) 2004  . Breast cancer ([place]) right    2011, T1A N0    Social History  Substance Use Topics  . Smoking status: Never Smoker   . Smokeless tobacco: Not on file  . Alcohol Use: 0.0 oz/week    0 Standard drinks or equivalent per week     Comment: occas     Current outpatient prescriptions:  .  alendronate (FOSAMAX) 70 MG tablet, Take 70 mg by mouth every 7 (seven) days. Take with a full glass of water on an empty stomach., Disp: , Rfl:  .  amLODipine (NORVASC) 5 MG tablet, Take 1 tablet by mouth 1 day or 1 dose., Disp: , Rfl: 1 .  anastrozole (ARIMIDEX) 1 MG tablet, Take 1 tablet (1 mg total) by mouth daily., Disp: 90 tablet, Rfl: 3 .  aspirin 81 MG tablet, Take 81 mg by mouth daily., Disp: , Rfl:  .  benzonatate (TESSALON) 200 MG capsule, Take 1 capsule (200 mg total) by mouth 3 (three) times daily as needed for cough., Disp: 30 capsule, Rfl: 1 .  CALCIUM-MAGNESIUM PO, Take 1 tablet by mouth daily., Disp: , Rfl:  .  cetirizine (ZYRTEC) 10 MG tablet, Take 10 mg by mouth daily., Disp: , Rfl:  .   chlorpheniramine-HYDROcodone (TUSSIONEX PENNKINETIC ER) 10-8 MG/5ML SUER, Take 5 mLs by mouth every 12 (twelve) hours as needed for cough., Disp: 100 mL, Rfl: 0 .  Cholecalciferol (VITAMIN D-3 PO), Take 1 tablet by mouth daily., Disp: , Rfl:  .  levofloxacin (LEVAQUIN) 500 MG tablet, , Disp: , Rfl: 0 .  levofloxacin (LEVAQUIN) 750 MG tablet, Take 1 tablet (750 mg total) by mouth daily., Disp: 7 tablet, Rfl: 0 .  meloxicam (MOBIC) 15 MG tablet, TAKE ONE TABLET BY MOUTH ONCE DAILY, Disp: 30 tablet, Rfl: 0 .  metoprolol succinate (TOPROL-XL) 25 MG 24 hr tablet, TAKE ONE TABLET BY MOUTH TWICE DAILY, Disp: 180 tablet, Rfl: 0 .  Multiple Vitamin (MULTIVITAMIN) tablet, Take 1 tablet by mouth daily., Disp: , Rfl:  .  pantoprazole (PROTONIX) 40 MG tablet, Take 1 tablet by mouth daily., Disp: , Rfl:  .  predniSONE (DELTASONE) 10 MG tablet, Take 1 tablet (10 mg total) by mouth 2 (two) times daily with a meal., Disp: 10 tablet, Rfl: 0  Allergies  Allergen Reactions  . Augmentin [Amoxicillin-Pot Clavulanate] Nausea Only    Review of Systems  Constitutional: Negative for fever, chills and weight loss.  HENT: Negative for congestion, hearing loss, sore throat and tinnitus.   Eyes: Negative for  blurred vision, double vision and redness.  Respiratory: Positive for cough and sputum production (mostly clear). Negative for hemoptysis, shortness of breath and wheezing.   Cardiovascular: Negative for chest pain, palpitations, orthopnea, claudication and leg swelling.  Gastrointestinal: Negative for heartburn, nausea, vomiting, diarrhea, constipation and blood in stool.  Genitourinary: Negative for dysuria, urgency, frequency and hematuria.  Musculoskeletal: Negative for myalgias, back pain, joint pain, falls and neck pain.  Skin: Negative for itching.  Neurological: Negative for dizziness, tingling, tremors, focal weakness, seizures, loss of consciousness, weakness and headaches.  Endo/Heme/Allergies: Does not  bruise/bleed easily.  Psychiatric/Behavioral: Negative for depression and substance abuse. The patient is not nervous/anxious and does not have insomnia.      Objective  Filed Vitals:   08/17/15 1116  BP: 120/70  Pulse: 97  Temp: 98.9 F (37.2 C)  TempSrc: Oral  Resp: 16  Height: 5\' 5"  (1.651 m)  Weight: 220 lb 8 oz (100.018 kg)  SpO2: 95%     Physical Exam  Constitutional: She is oriented to person, place, and time and well-developed, well-nourished, and in no distress.  HENT:  Head: Normocephalic.  Eyes: EOM are normal. Pupils are equal, round, and reactive to light.  Neck: Normal range of motion. No thyromegaly present.  Cardiovascular: Normal rate, regular rhythm and normal heart sounds.   No murmur heard. Pulmonary/Chest: Breath sounds normal.  Few basilar crackles. No wheezes rhonchi  Abdominal: Soft. Bowel sounds are normal.  Musculoskeletal: Normal range of motion. She exhibits no edema.  Neurological: She is alert and oriented to person, place, and time. No cranial nerve deficit. Gait normal.  Skin: Skin is warm and dry. No rash noted.  Psychiatric: Memory and affect normal.      Assessment & Plan  1. Pneumonia, unspecified laterality, unspecified part of lung Atelectasis and/or infiltrate basis on prior chest x-ray - DG Chest 2 View; Future  2. Cough Probable post bronchitic post URI cough - DG Chest 2 View; Future Delsym OTC 2 teaspoons twice a day and tramadol 50 mg every 8 hours when necessary cough  3. History of lymphoma Followed by oncologist

## 2015-08-18 ENCOUNTER — Telehealth: Payer: Self-pay | Admitting: Emergency Medicine

## 2015-08-18 NOTE — Telephone Encounter (Signed)
Patient notified. Appointment made.

## 2015-08-25 ENCOUNTER — Ambulatory Visit: Payer: Medicare Other | Attending: Oncology

## 2015-09-01 ENCOUNTER — Other Ambulatory Visit: Payer: Self-pay | Admitting: Family Medicine

## 2015-09-05 ENCOUNTER — Encounter: Payer: Self-pay | Admitting: Gynecology

## 2015-09-05 ENCOUNTER — Ambulatory Visit
Admission: EM | Admit: 2015-09-05 | Discharge: 2015-09-05 | Disposition: A | Discharge status: Home / Self Care | Payer: Medicare Other | Attending: Internal Medicine | Admitting: Internal Medicine

## 2015-09-05 DIAGNOSIS — L304 Erythema intertrigo: Secondary | ICD-10-CM | POA: Diagnosis not present

## 2015-09-05 MED ORDER — NYSTATIN-TRIAMCINOLONE 100000-0.1 UNIT/GM-% EX CREA: TOPICAL_CREAM | CUTANEOUS | Status: DC

## 2015-09-05 NOTE — Discharge Instructions (Signed)
Intertrigo Intertrigo is a skin condition that occurs in between folds of skin in places on the body that rub together a lot and do not get much ventilation. It is caused by heat, moisture, friction, sweat retention, and lack of air circulation, which produces red, irritated patches and, sometimes, scaling or drainage. People who have diabetes, who are obese, or who have treatment with antibiotics are at increased risk for intertrigo. The most common sites for intertrigo to occur include:  The groin.  The breasts.  The armpits.  Folds of abdominal skin.  Webbed spaces between the fingers or toes. Intertrigo may be aggravated by:  Sweat.  Feces.  Yeast or bacteria that are present near skin folds.  Urine.  Vaginal discharge. HOME CARE INSTRUCTIONS  The following steps can be taken to reduce friction and keep the affected area cool and dry:  Expose skin folds to the air.  Keep deep skin folds separated with cotton or linen cloth. Avoid tight fitting clothing that could cause chafing.  Wear open-toed shoes or sandals to help reduce moisture between the toes.  Apply absorbent powders to affected areas as directed by your caregiver.  Apply over-the-counter barrier pastes, such as zinc oxide, as directed by your caregiver.  If you develop a fungal infection in the affected area, your caregiver may have you use antifungal creams. SEEK MEDICAL CARE IF:   The rash is not improving after 1 week of treatment.  The rash is getting worse (more red, more swollen, more painful, or spreading).  You have a fever or chills. MAKE SURE YOU:   Understand these instructions.  Will watch your condition.  Will get help right away if you are not doing well or get worse.   This information is not intended to replace advice given to you by your health care provider. Make sure you discuss any questions you have with your health care provider.   Document Released: 10/16/2005 Document  Revised: 01/08/2012 Document Reviewed: 04/19/2015 Elsevier Interactive Patient Education 2016 [employer]  

## 2015-09-05 NOTE — ED Provider Notes (Signed)
CSN: [id number]     Arrival date & time 09/05/15  1336 History   First MD Initiated Contact with Patient 09/05/15 1518     Chief Complaint  Patient presents with  . Rash   (Consider location/radiation/quality/duration/timing/severity/associated sxs/prior Treatment) HPI  Mary Todd is a pleasant 69 y.o. female who presents with rash for 2 weeks. She describes the rash is burning and itchy. Its felt like "heat rash". Rash is on her breastbone and below both breasts as well as at her waistband. She has tried Aand D ointment and corn starch with minimal relief. She was recently on antibiotics for pneumonia and completed them about 3 weeks ago. She denies any new detergents or soaps. She denies any foreign travel or ill contacts. She denies fever or chills. She denies any joint pain or swelling. Denies shortness of breath, wheezing, or history of allergies or eczema. Warmth seems to make the symptoms worse. Denies any fever or chills.  Past Medical History  Diagnosis Date  . Hypertension 2006  . Personal history of malignant neoplasm of breast 2011    right breast  . Esophageal reflux   . Hodgkin's disease ([place]) 2004  . Breast cancer ([place]) right    2011, T1A N0   Past Surgical History  Procedure Laterality Date  . Colonoscopy  2003  . Port a cath revision    . Wrist surgery  2005  . Back surgery    . Abdominal hysterectomy    . Breast surgery Right 2011    right breast lumpectomy,L/SN/R for right breast CA   . Breast biopsy Right 2011    radiation   Family History  Problem Relation Age of Onset  . Cancer Father   . Stroke Brother   . Breast cancer Mother 55   Social History  Substance Use Topics  . Smoking status: Never Smoker   . Smokeless tobacco: None  . Alcohol Use: 0.0 oz/week    0 Standard drinks or equivalent per week     Comment: occas   OB History    Gravida Para Term Preterm AB TAB SAB Ectopic Multiple Living   2 2        2       Obstetric Comments   1st Menstrual Cycle:  12 1st Pregnancy:  22.     Review of Systems  Constitutional: Negative.   HENT: Negative.   Eyes: Negative.   Respiratory: Negative.   Cardiovascular: Negative.   Gastrointestinal: Negative.   Genitourinary: Negative.   Musculoskeletal: Negative.   Skin:       See history of present illness  Neurological: Negative.   Psychiatric/Behavioral: Negative.     Allergies  Augmentin  Home Medications   Prior to Admission medications   Medication Sig Start Date End Date Taking? Authorizing Provider  alendronate (FOSAMAX) 70 MG tablet Take 70 mg by mouth every 7 (seven) days. Take with a full glass of water on an empty stomach.   Yes Historical Provider, MD  amLODipine (NORVASC) 5 MG tablet Take 1 tablet by mouth 1 day or 1 dose. 04/21/15  Yes Historical Provider, MD  anastrozole (ARIMIDEX) 1 MG tablet Take 1 tablet (1 mg total) by mouth daily. 08/16/15  Yes Forest Gleason, MD  aspirin 81 MG tablet Take 81 mg by mouth daily.   Yes Historical Provider, MD  benzonatate (TESSALON) 200 MG capsule Take 1 capsule (200 mg total) by mouth 3 (three) times daily as needed for cough. 07/31/15  Yes Mickel Baas  Darliss Cheney, MD  CALCIUM-MAGNESIUM PO Take 1 tablet by mouth daily.   Yes Historical Provider, MD  cetirizine (ZYRTEC) 10 MG tablet Take 10 mg by mouth daily.   Yes Historical Provider, MD  chlorpheniramine-HYDROcodone (TUSSIONEX PENNKINETIC ER) 10-8 MG/5ML SUER Take 5 mLs by mouth every 12 (twelve) hours as needed for cough. 08/04/15  Yes Bobetta Lime, MD  Cholecalciferol (VITAMIN D-3 PO) Take 1 tablet by mouth daily.   Yes Historical Provider, MD  levofloxacin (LEVAQUIN) 500 MG tablet  08/04/15  Yes Historical Provider, MD  levofloxacin (LEVAQUIN) 750 MG tablet Take 1 tablet (750 mg total) by mouth daily. 08/10/15  Yes Bobetta Lime, MD  meloxicam (MOBIC) 15 MG tablet TAKE ONE TABLET BY MOUTH ONCE DAILY 09/01/15  Yes Ashok Norris, MD  metoprolol succinate (TOPROL-XL) 25 MG 24 hr  tablet TAKE ONE TABLET BY MOUTH TWICE DAILY 05/24/15  Yes Ashok Norris, MD  Multiple Vitamin (MULTIVITAMIN) tablet Take 1 tablet by mouth daily.   Yes Historical Provider, MD  pantoprazole (PROTONIX) 40 MG tablet Take 1 tablet by mouth daily. 05/20/13  Yes Historical Provider, MD  predniSONE (DELTASONE) 10 MG tablet Take 1 tablet (10 mg total) by mouth 2 (two) times daily with a meal. 08/04/15  Yes Bobetta Lime, MD  traMADol (ULTRAM) 50 MG tablet Take 1 tablet (50 mg total) by mouth every 8 (eight) hours as needed. 08/17/15  Yes Ashok Norris, MD  nystatin-triamcinolone (MYCOLOG II) cream Apply to affected area BID 09/05/15   Andria Meuse, NP   Meds Ordered and Administered this Visit  Medications - No data to display  BP 155/87 mmHg  Pulse 84  Temp(Src) 97.8 F (36.6 C) (Oral)  Resp 18  Ht 5\' 5"  (1.651 m)  Wt 218 lb (98.884 kg)  BMI 36.28 kg/m2  SpO2 100% No data found.   Physical Exam  Constitutional: She is oriented to person, place, and time. She appears well-developed and well-nourished. No distress.  HENT:  Head: Normocephalic and atraumatic.  Eyes: Conjunctivae are normal. No scleral icterus.  Neck: Normal range of motion.  Cardiovascular: Normal rate and regular rhythm.   Pulmonary/Chest: Effort normal and breath sounds normal. No respiratory distress.  Abdominal: Soft. Bowel sounds are normal. She exhibits no distension.  Musculoskeletal: Normal range of motion. She exhibits no edema or tenderness.  Neurological: She is alert and oriented to person, place, and time. No cranial nerve deficit.  Skin: Skin is warm and dry. Rash noted. There is erythema. No cyanosis. Nails show no clubbing.     Erythematous, macerated plaques erosions with fine scaling  Psychiatric: Her behavior is normal. Judgment normal.  Nursing note and vitals reviewed.   ED Course  Procedures none  MDM   1. Intertrigo   Patient has been on recent antibiotic therapy.  She understands if  treatment is unsuccessful she will need follow-up with her PCP.  Rash would be atypical for inflammatory breast cancer.  Plan: Diagnosis reviewed with patient Rx as per orders;  benefits, risks, potential side effects reviewed  Recommend supportive treatment with keeping the area clean and dry Seek additional medical care if symptoms worsen or are not improving     Andria Meuse, NP 09/05/15 1543

## 2015-09-05 NOTE — ED Notes (Signed)
Patient c/o rash under both breast and lower abdomen region x 2 weeks. Patient stated burning / itching at site.

## 2015-09-13 ENCOUNTER — Other Ambulatory Visit: Payer: Self-pay | Admitting: Family Medicine

## 2015-09-20 ENCOUNTER — Encounter: Payer: Self-pay | Admitting: Family Medicine

## 2015-09-20 ENCOUNTER — Ambulatory Visit
Admission: RE | Admit: 2015-09-20 | Discharge: 2015-09-20 | Disposition: A | Discharge status: Home / Self Care | Payer: Medicare Other | Source: Ambulatory Visit | Attending: Family Medicine | Admitting: Family Medicine

## 2015-09-20 ENCOUNTER — Ambulatory Visit (INDEPENDENT_AMBULATORY_CARE_PROVIDER_SITE_OTHER): Payer: Medicare Other | Admitting: Family Medicine

## 2015-09-20 VITALS — BP 122/76 | HR 97 | Temp 98.0°F | Resp 14 | Ht 65.0 in | Wt 222.5 lb

## 2015-09-20 DIAGNOSIS — Z8701 Personal history of pneumonia (recurrent): Secondary | ICD-10-CM | POA: Insufficient documentation

## 2015-09-20 DIAGNOSIS — J189 Pneumonia, unspecified organism: Secondary | ICD-10-CM | POA: Insufficient documentation

## 2015-09-20 DIAGNOSIS — Z23 Encounter for immunization: Secondary | ICD-10-CM

## 2015-09-20 DIAGNOSIS — R05 Cough: Secondary | ICD-10-CM | POA: Diagnosis not present

## 2015-09-20 MED ORDER — MELOXICAM 15 MG PO TABS: 15.0000 mg | ORAL_TABLET | Freq: Every day | ORAL | Status: DC

## 2015-09-20 MED ORDER — PANTOPRAZOLE SODIUM 40 MG PO TBEC: 40.0000 mg | DELAYED_RELEASE_TABLET | Freq: Every day | ORAL | Status: DC

## 2015-09-20 NOTE — Progress Notes (Signed)
Name: Mary Todd   MRN: [id number]    DOB: 1946-01-10   Date:09/20/2015       Progress Note  Subjective  Chief Complaint  Chief Complaint  Patient presents with  . Pneumonia    follow up    HPI  Pneumonia follow-up  Patient has completed her course of levofloxacin several weeks ago. Currently she has no further cough congestion hemoptysis fever chills shortness of breath. She is feeling back to baseline. The oncologist and found no new problems or concerns. She has not had a follow-up x-ray for conclusion of complete clearing however.  Past Medical History  Diagnosis Date  . Hypertension 2006  . Personal history of malignant neoplasm of breast 2011    right breast  . Esophageal reflux   . Hodgkin's disease ([place]) 2004  . Breast cancer ([place]) right    2011, T1A N0    Social History  Substance Use Topics  . Smoking status: Never Smoker   . Smokeless tobacco: Not on file  . Alcohol Use: 0.0 oz/week    0 Standard drinks or equivalent per week     Comment: occas     Current outpatient prescriptions:  .  alendronate (FOSAMAX) 70 MG tablet, Take 70 mg by mouth every 7 (seven) days. Take with a full glass of water on an empty stomach., Disp: , Rfl:  .  amLODipine (NORVASC) 5 MG tablet, Take 1 tablet by mouth 1 day or 1 dose., Disp: , Rfl: 1 .  anastrozole (ARIMIDEX) 1 MG tablet, Take 1 tablet (1 mg total) by mouth daily., Disp: 90 tablet, Rfl: 3 .  aspirin 81 MG tablet, Take 81 mg by mouth daily., Disp: , Rfl:  .  CALCIUM-MAGNESIUM PO, Take 1 tablet by mouth daily., Disp: , Rfl:  .  cetirizine (ZYRTEC) 10 MG tablet, Take 10 mg by mouth daily., Disp: , Rfl:  .  Cholecalciferol (VITAMIN D-3 PO), Take 1 tablet by mouth daily., Disp: , Rfl:  .  meloxicam (MOBIC) 15 MG tablet, Take 1 tablet (15 mg total) by mouth daily., Disp: 30 tablet, Rfl: 5 .  Multiple Vitamin (MULTIVITAMIN) tablet, Take 1 tablet by mouth daily., Disp: , Rfl:  .  nystatin-triamcinolone (MYCOLOG II)  cream, Apply to affected area BID, Disp: 15 g, Rfl: 0 .  pantoprazole (PROTONIX) 40 MG tablet, Take 1 tablet (40 mg total) by mouth daily., Disp: 30 tablet, Rfl: 5 .  traMADol (ULTRAM) 50 MG tablet, Take 1 tablet (50 mg total) by mouth every 8 (eight) hours as needed., Disp: 42 tablet, Rfl: 0 .  metoprolol succinate (TOPROL-XL) 50 MG 24 hr tablet, , Disp: , Rfl: 0  Allergies  Allergen Reactions  . Augmentin [Amoxicillin-Pot Clavulanate] Nausea Only    Review of Systems  Constitutional: Negative for fever, chills and weight loss.  HENT: Negative for congestion, hearing loss, sore throat and tinnitus.   Eyes: Negative for blurred vision, double vision and redness.  Respiratory: Negative for cough, hemoptysis and shortness of breath.   Cardiovascular: Negative for chest pain, palpitations, orthopnea, claudication and leg swelling.  Gastrointestinal: Negative for heartburn, nausea, vomiting, diarrhea, constipation and blood in stool.  Genitourinary: Negative for dysuria, urgency, frequency and hematuria.  Musculoskeletal: Negative for myalgias, back pain, joint pain, falls and neck pain.  Skin: Negative for itching.  Neurological: Negative for dizziness, tingling, tremors, focal weakness, seizures, loss of consciousness, weakness and headaches.  Endo/Heme/Allergies: Does not bruise/bleed easily.  Psychiatric/Behavioral: Negative for depression and substance abuse. The  patient is not nervous/anxious and does not have insomnia.      Objective  Filed Vitals:   09/20/15 1005  BP: 122/76  Pulse: 97  Temp: 98 F (36.7 C)  TempSrc: Oral  Resp: 14  Height: 5\' 5"  (1.651 m)  Weight: 222 lb 8 oz (100.925 kg)  SpO2: 97%     Physical Exam  Constitutional: She is oriented to person, place, and time.  Obese and in no acute distress  HENT:  Head: Normocephalic.  Eyes: EOM are normal. Pupils are equal, round, and reactive to light.  Neck: Normal range of motion. No thyromegaly present.   Cardiovascular: Normal rate, regular rhythm and normal heart sounds.   No murmur heard. Pulmonary/Chest: Effort normal and breath sounds normal. No respiratory distress. She has no wheezes. She has no rales. She exhibits no tenderness.  Abdominal: Soft. Bowel sounds are normal.  Musculoskeletal: Normal range of motion. She exhibits no edema.  Neurological: She is alert and oriented to person, place, and time. No cranial nerve deficit. Gait normal.  Skin: Skin is warm and dry. No rash noted.  Psychiatric: Memory and affect normal.      Assessment & Plan  1. Bilateral pneumonia Chest x-ray to follow-up of cure  2. Need for influenza vaccination Given today - Flu vaccine HIGH DOSE PF (Fluzone High dose)

## 2015-10-19 ENCOUNTER — Ambulatory Visit
Admission: RE | Admit: 2015-10-19 | Discharge: 2015-10-19 | Disposition: A | Discharge status: Home / Self Care | Payer: Medicare Other | Source: Ambulatory Visit | Attending: Family Medicine | Admitting: Family Medicine

## 2015-10-19 ENCOUNTER — Ambulatory Visit (INDEPENDENT_AMBULATORY_CARE_PROVIDER_SITE_OTHER): Payer: Medicare Other | Admitting: Family Medicine

## 2015-10-19 ENCOUNTER — Encounter: Payer: Self-pay | Admitting: Family Medicine

## 2015-10-19 VITALS — BP 128/80 | HR 94 | Temp 98.4°F | Resp 18 | Ht 65.0 in | Wt 225.3 lb

## 2015-10-19 DIAGNOSIS — J189 Pneumonia, unspecified organism: Secondary | ICD-10-CM | POA: Insufficient documentation

## 2015-10-19 DIAGNOSIS — Z8579 Personal history of other malignant neoplasms of lymphoid, hematopoietic and related tissues: Secondary | ICD-10-CM

## 2015-10-19 DIAGNOSIS — R61 Generalized hyperhidrosis: Secondary | ICD-10-CM | POA: Diagnosis not present

## 2015-10-19 DIAGNOSIS — J9811 Atelectasis: Secondary | ICD-10-CM | POA: Diagnosis not present

## 2015-10-19 NOTE — Progress Notes (Signed)
Name: Mary Todd   MRN: [id number]    DOB: 1946-01-18   Date:10/19/2015       Progress Note  Subjective  Chief Complaint  Chief Complaint  Patient presents with  . Pneumonia    1 month follow up    HPI  Follow-up pneumonia.  Patient is returning for one-month follow-up follow-up pneumonia. A repeat x-ray did not show complete clearing. It is of note that she has a history of lymphoma and therefore we're very concerned couldn't to be sure that we follow her x-ray through to complete resolution. Symptoms currently consistent  Sweats  Patient complains of night sweats particularly anterior chest area. This been no weight loss no nausea no vomiting. She has a history of lymphoma has not seen her oncologist and several months it is a had blood work done anytime recently. There's been no weight loss. There's been no dyspnea or cough fever or chills.  Past Medical History  Diagnosis Date  . Hypertension 2006  . Personal history of malignant neoplasm of breast 2011    right breast  . Esophageal reflux   . Hodgkin's disease ([place]) 2004  . Breast cancer ([place]) right    2011, T1A N0    Social History  Substance Use Topics  . Smoking status: Never Smoker   . Smokeless tobacco: Not on file  . Alcohol Use: 0.0 oz/week    0 Standard drinks or equivalent per week     Comment: occas     Current outpatient prescriptions:  .  alendronate (FOSAMAX) 70 MG tablet, Take 70 mg by mouth every 7 (seven) days. Take with a full glass of water on an empty stomach., Disp: , Rfl:  .  amLODipine (NORVASC) 5 MG tablet, Take 1 tablet by mouth 1 day or 1 dose., Disp: , Rfl: 1 .  anastrozole (ARIMIDEX) 1 MG tablet, Take 1 tablet (1 mg total) by mouth daily., Disp: 90 tablet, Rfl: 3 .  aspirin 81 MG tablet, Take 81 mg by mouth daily., Disp: , Rfl:  .  CALCIUM-MAGNESIUM PO, Take 1 tablet by mouth daily., Disp: , Rfl:  .  cetirizine (ZYRTEC) 10 MG tablet, Take 10 mg by mouth daily., Disp: , Rfl:   .  Cholecalciferol (VITAMIN D-3 PO), Take 1 tablet by mouth daily., Disp: , Rfl:  .  meloxicam (MOBIC) 15 MG tablet, Take 1 tablet (15 mg total) by mouth daily., Disp: 30 tablet, Rfl: 5 .  metoprolol succinate (TOPROL-XL) 50 MG 24 hr tablet, , Disp: , Rfl: 0 .  Multiple Vitamin (MULTIVITAMIN) tablet, Take 1 tablet by mouth daily., Disp: , Rfl:  .  nystatin-triamcinolone (MYCOLOG II) cream, Apply to affected area BID, Disp: 15 g, Rfl: 0 .  pantoprazole (PROTONIX) 40 MG tablet, Take 1 tablet (40 mg total) by mouth daily., Disp: 30 tablet, Rfl: 5 .  traMADol (ULTRAM) 50 MG tablet, Take 1 tablet (50 mg total) by mouth every 8 (eight) hours as needed., Disp: 42 tablet, Rfl: 0  Allergies  Allergen Reactions  . Augmentin [Amoxicillin-Pot Clavulanate] Nausea Only    Review of Systems  Constitutional: Negative for fever, chills and weight loss.  HENT: Negative for congestion, hearing loss, sore throat and tinnitus.   Eyes: Negative for blurred vision, double vision and redness.  Respiratory: Negative for cough, hemoptysis, sputum production, shortness of breath and wheezing.   Cardiovascular: Negative for chest pain, palpitations, orthopnea, claudication and leg swelling.  Gastrointestinal: Negative for heartburn, nausea, vomiting, diarrhea, constipation and blood  in stool.  Genitourinary: Negative for dysuria, urgency, frequency and hematuria.  Musculoskeletal: Negative for myalgias, back pain, joint pain, falls and neck pain.  Skin: Negative for itching.  Neurological: Negative for dizziness, tingling, tremors, focal weakness, seizures, loss of consciousness, weakness and headaches.  Endo/Heme/Allergies: Does not bruise/bleed easily.       Sweats at night  Psychiatric/Behavioral: Negative for depression and substance abuse. The patient is not nervous/anxious and does not have insomnia.      Objective  Filed Vitals:   10/19/15 0813  BP: 128/80  Pulse: 94  Temp: 98.4 F (36.9 C)  Resp:  18  Height: 5\' 5"  (1.651 m)  Weight: 225 lb 5 oz (102.201 kg)  SpO2: 97%     Physical Exam  Constitutional: She is oriented to person, place, and time and well-developed, well-nourished, and in no distress.  Obesity no acute distress  HENT:  Head: Normocephalic.  Eyes: EOM are normal. Pupils are equal, round, and reactive to light.  Neck: Normal range of motion. No thyromegaly present.  Cardiovascular: Normal rate, regular rhythm and normal heart sounds.   No murmur heard. Pulmonary/Chest: Effort normal and breath sounds normal.  Abdominal: Soft. Bowel sounds are normal.  Musculoskeletal: Normal range of motion. She exhibits no edema.  Neurological: She is alert and oriented to person, place, and time. No cranial nerve deficit. Gait normal.  Skin: Skin is warm and dry. No rash noted.  Psychiatric: Memory and affect normal.      Assessment & Plan   1. Bilateral pneumonia Clinically improved - DG Chest 2 View; Future - CBC  2. Excessive sweating Check CBC and TSH in view of her have a history of lymphoma - TSH  3. History of lymphoma That with oncologist as soon as available - CBC - TSH

## 2015-10-20 ENCOUNTER — Telehealth: Payer: Self-pay

## 2015-10-20 LAB — CBC
HEMOGLOBIN: 12.9 g/dL (ref 11.1–15.9)
Hematocrit: 38.5 % (ref 34.0–46.6)
MCH: 29.6 pg (ref 26.6–33.0)
MCHC: 33.5 g/dL (ref 31.5–35.7)
MCV: 88 fL (ref 79–97)
Platelets: 209 10*3/uL (ref 150–379)
RBC: 4.36 x10E6/uL (ref 3.77–5.28)
RDW: 14.1 % (ref 12.3–15.4)
WBC: 6.7 10*3/uL (ref 3.4–10.8)

## 2015-10-20 LAB — TSH: TSH: 1.13 u[IU]/mL (ref 0.450–4.500)

## 2015-10-20 NOTE — Telephone Encounter (Signed)
Pt was seen on Tues and was told that you would send a cream for her to the pharmacy but you did not record it on the paperwork and she could not remember what the name of the cream was.

## 2015-10-26 MED ORDER — HYDROCERIN EX CREA: 1.0000 "application " | TOPICAL_CREAM | Freq: Two times a day (BID) | CUTANEOUS | Status: DC

## 2015-10-26 NOTE — Telephone Encounter (Signed)
Eucerin and mix with hydrocortisone

## 2015-11-02 ENCOUNTER — Telehealth: Payer: Self-pay | Admitting: Emergency Medicine

## 2015-11-02 NOTE — Telephone Encounter (Signed)
Would like to go had and do CT of chest now if possible. Was suppose to have one with Dr. Cynda Acres but he is no longer there.

## 2015-11-03 ENCOUNTER — Other Ambulatory Visit: Payer: Self-pay | Admitting: Emergency Medicine

## 2015-11-03 DIAGNOSIS — R9389 Abnormal findings on diagnostic imaging of other specified body structures: Secondary | ICD-10-CM

## 2015-11-03 NOTE — Telephone Encounter (Signed)
Raysal with contrast

## 2015-11-05 DIAGNOSIS — H2513 Age-related nuclear cataract, bilateral: Secondary | ICD-10-CM | POA: Diagnosis not present

## 2015-11-11 ENCOUNTER — Ambulatory Visit
Admission: RE | Admit: 2015-11-11 | Discharge: 2015-11-11 | Disposition: A | Discharge status: Home / Self Care | Payer: Medicare Other | Source: Ambulatory Visit | Attending: Family Medicine | Admitting: Family Medicine

## 2015-11-11 ENCOUNTER — Other Ambulatory Visit: Payer: Self-pay | Admitting: Emergency Medicine

## 2015-11-11 DIAGNOSIS — R9389 Abnormal findings on diagnostic imaging of other specified body structures: Secondary | ICD-10-CM

## 2015-11-11 DIAGNOSIS — C8590 Non-Hodgkin lymphoma, unspecified, unspecified site: Secondary | ICD-10-CM | POA: Diagnosis not present

## 2015-11-11 DIAGNOSIS — Z01812 Encounter for preprocedural laboratory examination: Secondary | ICD-10-CM | POA: Diagnosis not present

## 2015-11-11 DIAGNOSIS — R938 Abnormal findings on diagnostic imaging of other specified body structures: Secondary | ICD-10-CM | POA: Insufficient documentation

## 2015-11-11 LAB — CREATININE, SERUM
CREATININE: 0.64 mg/dL (ref 0.44–1.00)
GFR calc Af Amer: >60 mL/min (ref >60)

## 2015-11-11 MED ORDER — IOHEXOL 300 MG/ML  SOLN
75.0000 mL | Freq: Once | INTRAMUSCULAR | Status: AC | PRN
  Administered 2015-11-11: 75 mL via INTRAVENOUS

## 2015-11-12 ENCOUNTER — Telehealth: Payer: Self-pay | Admitting: Emergency Medicine

## 2015-11-12 NOTE — Telephone Encounter (Signed)
Patient noticed of CT results

## 2015-11-23 ENCOUNTER — Ambulatory Visit: Payer: Medicare Other | Admitting: Family Medicine

## 2015-12-17 ENCOUNTER — Ambulatory Visit (INDEPENDENT_AMBULATORY_CARE_PROVIDER_SITE_OTHER): Payer: Medicare Other | Admitting: Family Medicine

## 2015-12-17 ENCOUNTER — Ambulatory Visit
Admission: RE | Admit: 2015-12-17 | Discharge: 2015-12-17 | Disposition: A | Discharge status: Home / Self Care | Payer: Medicare Other | Source: Ambulatory Visit | Attending: Family Medicine | Admitting: Family Medicine

## 2015-12-17 ENCOUNTER — Encounter: Payer: Self-pay | Admitting: Family Medicine

## 2015-12-17 VITALS — BP 130/80 | HR 90 | Temp 98.6°F | Resp 16 | Ht 65.0 in | Wt 224.8 lb

## 2015-12-17 DIAGNOSIS — M79601 Pain in right arm: Secondary | ICD-10-CM | POA: Insufficient documentation

## 2015-12-17 DIAGNOSIS — M79621 Pain in right upper arm: Secondary | ICD-10-CM | POA: Diagnosis not present

## 2015-12-17 MED ORDER — OXYCODONE-ACETAMINOPHEN 5-325 MG PO TABS: 1.0000 | ORAL_TABLET | Freq: Two times a day (BID) | ORAL | Status: DC | PRN

## 2015-12-17 NOTE — Progress Notes (Signed)
Name: Mary Todd   MRN: [id number]    DOB: 1946-03-24   Date:12/17/2015       Progress Note  Subjective  Chief Complaint  Chief Complaint  Patient presents with  . Shoulder Pain    Joint pain that startes in right arm  and goes to shoulder and neck. Pain a 8 on 1 to 10 scale at night. Patient stated pain has been going on for 18 days     Arm Pain  The pain is present in the upper right arm. The pain radiates to the right neck. The pain is at a severity of 8/10 (pain usually wakes up at night). The pain is moderate. Pertinent negatives include no chest pain. She has tried acetaminophen and heat for the symptoms. The treatment provided moderate relief.    Past Medical History  Diagnosis Date  . Hypertension 2006  . Personal history of malignant neoplasm of breast 2011    right breast  . Esophageal reflux   . Hodgkin's disease (HCC) 2004  . Breast cancer (HCC) right    2011, T1A N0    Past Surgical History  Procedure Laterality Date  . Colonoscopy  2003  . Port a cath revision    . Wrist surgery  2005  . Back surgery    . Abdominal hysterectomy    . Breast surgery Right 2011    right breast lumpectomy,L/SN/R for right breast CA   . Breast biopsy Right 2011    radiation    Family History  Problem Relation Age of Onset  . Cancer Father   . Stroke Brother   . Breast cancer Mother 35    Social History   Social History  . Marital Status: Married    Spouse Name: N/A  . Number of Children: N/A  . Years of Education: N/A   Occupational History  . Not on file.   Social History Main Topics  . Smoking status: Never Smoker   . Smokeless tobacco: Not on file  . Alcohol Use: 0.0 oz/week    0 Standard drinks or equivalent per week     Comment: occas  . Drug Use: No  . Sexual Activity: Not on file   Other Topics Concern  . Not on file   Social History Narrative     Current outpatient prescriptions:  .  alendronate (FOSAMAX) 70 MG tablet, Take 70 mg  by mouth every 7 (seven) days. Take with a full glass of water on an empty stomach., Disp: , Rfl:  .  amLODipine (NORVASC) 5 MG tablet, Take 1 tablet by mouth 1 day or 1 dose., Disp: , Rfl: 1 .  anastrozole (ARIMIDEX) 1 MG tablet, Take 1 tablet (1 mg total) by mouth daily., Disp: 90 tablet, Rfl: 3 .  aspirin 81 MG tablet, Take 81 mg by mouth daily., Disp: , Rfl:  .  CALCIUM-MAGNESIUM PO, Take 1 tablet by mouth daily., Disp: , Rfl:  .  cetirizine (ZYRTEC) 10 MG tablet, Take 10 mg by mouth daily., Disp: , Rfl:  .  Cholecalciferol (VITAMIN D-3 PO), Take 1 tablet by mouth daily., Disp: , Rfl:  .  hydrocerin (EUCERIN) CREA, Apply 1 application topically 2 (two) times daily., Disp: 228 g, Rfl: 0 .  meloxicam (MOBIC) 15 MG tablet, Take 1 tablet (15 mg total) by mouth daily., Disp: 30 tablet, Rfl: 5 .  metoprolol succinate (TOPROL-XL) 50 MG 24 hr tablet, , Disp: , Rfl: 0 .  Multiple Vitamin (MULTIVITAMIN) tablet, Take  1 tablet by mouth daily., Disp: , Rfl:  .  nystatin-triamcinolone (MYCOLOG II) cream, Apply to affected area BID, Disp: 15 g, Rfl: 0 .  oxyCODONE-acetaminophen (PERCOCET/ROXICET) 5-325 MG tablet, Take 1 tablet by mouth every 12 (twelve) hours as needed for severe pain., Disp: 30 tablet, Rfl: 0 .  pantoprazole (PROTONIX) 40 MG tablet, Take 1 tablet (40 mg total) by mouth daily., Disp: 30 tablet, Rfl: 5  Allergies  Allergen Reactions  . Augmentin [Amoxicillin-Pot Clavulanate] Nausea Only     Review of Systems  Cardiovascular: Negative for chest pain.  Musculoskeletal: Positive for back pain and joint pain.     Objective  Filed Vitals:   12/17/15 1025  BP: 130/80  Pulse: 90  Temp: 98.6 F (37 C)  TempSrc: Oral  Resp: 16  Height: 5\' 5"  (1.651 m)  Weight: 224 lb 12.8 oz (101.969 kg)  SpO2: 95%    Physical Exam  Constitutional: She is well-developed, well-nourished, and in no distress.  Musculoskeletal:       Right shoulder: She exhibits no tenderness, no pain and no  spasm.       Right upper arm: She exhibits tenderness. She exhibits no swelling and no edema.  Tenderness to palpation over the right upper armabout the mid shaft of the humerus. No erythema, or swelling.  Nursing note and vitals reviewed.      Assessment & Plan  1. Right arm pain Likely musculoskeletal, relieved with heating pad and Tylenol. Obtain x-rays of the painful area for evaluation.tarted on Percocet for 2 weeks and follow-up. - DG Humerus Right; Future - oxyCODONE-acetaminophen (PERCOCET/ROXICET) 5-325 MG tablet; Take 1 tablet by mouth every 12 (twelve) hours as needed for severe pain.  Dispense: 30 tablet; Refill: 0   Ramzy Cappelletti Asad [facility] 12/17/2015 5:46 PM

## 2015-12-24 DIAGNOSIS — M7541 Impingement syndrome of right shoulder: Secondary | ICD-10-CM | POA: Diagnosis not present

## 2015-12-24 DIAGNOSIS — M1712 Unilateral primary osteoarthritis, left knee: Secondary | ICD-10-CM | POA: Diagnosis not present

## 2015-12-29 DIAGNOSIS — M25511 Pain in right shoulder: Secondary | ICD-10-CM | POA: Diagnosis not present

## 2015-12-29 DIAGNOSIS — M25611 Stiffness of right shoulder, not elsewhere classified: Secondary | ICD-10-CM | POA: Diagnosis not present

## 2016-01-07 ENCOUNTER — Encounter: Payer: Self-pay | Admitting: *Deleted

## 2016-01-07 DIAGNOSIS — M25611 Stiffness of right shoulder, not elsewhere classified: Secondary | ICD-10-CM | POA: Diagnosis not present

## 2016-01-07 DIAGNOSIS — M25511 Pain in right shoulder: Secondary | ICD-10-CM | POA: Diagnosis not present

## 2016-01-12 DIAGNOSIS — M25611 Stiffness of right shoulder, not elsewhere classified: Secondary | ICD-10-CM | POA: Diagnosis not present

## 2016-01-12 DIAGNOSIS — M25511 Pain in right shoulder: Secondary | ICD-10-CM | POA: Diagnosis not present

## 2016-01-19 ENCOUNTER — Ambulatory Visit: Payer: Medicare Other | Admitting: Family Medicine

## 2016-01-19 DIAGNOSIS — M25511 Pain in right shoulder: Secondary | ICD-10-CM | POA: Diagnosis not present

## 2016-01-19 DIAGNOSIS — M25611 Stiffness of right shoulder, not elsewhere classified: Secondary | ICD-10-CM | POA: Diagnosis not present

## 2016-01-21 DIAGNOSIS — M25511 Pain in right shoulder: Secondary | ICD-10-CM | POA: Diagnosis not present

## 2016-01-21 DIAGNOSIS — M25611 Stiffness of right shoulder, not elsewhere classified: Secondary | ICD-10-CM | POA: Diagnosis not present

## 2016-01-24 DIAGNOSIS — M25611 Stiffness of right shoulder, not elsewhere classified: Secondary | ICD-10-CM | POA: Diagnosis not present

## 2016-01-24 DIAGNOSIS — M25511 Pain in right shoulder: Secondary | ICD-10-CM | POA: Diagnosis not present

## 2016-01-28 DIAGNOSIS — M25511 Pain in right shoulder: Secondary | ICD-10-CM | POA: Diagnosis not present

## 2016-01-28 DIAGNOSIS — M25611 Stiffness of right shoulder, not elsewhere classified: Secondary | ICD-10-CM | POA: Diagnosis not present

## 2016-01-29 DIAGNOSIS — M25611 Stiffness of right shoulder, not elsewhere classified: Secondary | ICD-10-CM | POA: Diagnosis not present

## 2016-01-29 DIAGNOSIS — M25511 Pain in right shoulder: Secondary | ICD-10-CM | POA: Diagnosis not present

## 2016-01-31 DIAGNOSIS — M25611 Stiffness of right shoulder, not elsewhere classified: Secondary | ICD-10-CM | POA: Diagnosis not present

## 2016-01-31 DIAGNOSIS — M25511 Pain in right shoulder: Secondary | ICD-10-CM | POA: Diagnosis not present

## 2016-02-03 DIAGNOSIS — M25511 Pain in right shoulder: Secondary | ICD-10-CM | POA: Diagnosis not present

## 2016-02-03 DIAGNOSIS — M25611 Stiffness of right shoulder, not elsewhere classified: Secondary | ICD-10-CM | POA: Diagnosis not present

## 2016-02-14 DIAGNOSIS — M25611 Stiffness of right shoulder, not elsewhere classified: Secondary | ICD-10-CM | POA: Diagnosis not present

## 2016-02-14 DIAGNOSIS — M25511 Pain in right shoulder: Secondary | ICD-10-CM | POA: Diagnosis not present

## 2016-02-17 DIAGNOSIS — M25511 Pain in right shoulder: Secondary | ICD-10-CM | POA: Diagnosis not present

## 2016-02-17 DIAGNOSIS — M25611 Stiffness of right shoulder, not elsewhere classified: Secondary | ICD-10-CM | POA: Diagnosis not present

## 2016-02-21 ENCOUNTER — Other Ambulatory Visit: Payer: Self-pay

## 2016-02-21 DIAGNOSIS — M25611 Stiffness of right shoulder, not elsewhere classified: Secondary | ICD-10-CM | POA: Diagnosis not present

## 2016-02-21 DIAGNOSIS — M25511 Pain in right shoulder: Secondary | ICD-10-CM | POA: Diagnosis not present

## 2016-02-21 NOTE — Telephone Encounter (Signed)
Patient requesting refill. 

## 2016-02-22 MED ORDER — METOPROLOL SUCCINATE ER 50 MG PO TB24: 50.0000 mg | ORAL_TABLET | Freq: Every day | ORAL | Status: DC

## 2016-02-24 DIAGNOSIS — M25511 Pain in right shoulder: Secondary | ICD-10-CM | POA: Diagnosis not present

## 2016-02-24 DIAGNOSIS — M25611 Stiffness of right shoulder, not elsewhere classified: Secondary | ICD-10-CM | POA: Diagnosis not present

## 2016-02-28 DIAGNOSIS — M25611 Stiffness of right shoulder, not elsewhere classified: Secondary | ICD-10-CM | POA: Diagnosis not present

## 2016-02-28 DIAGNOSIS — M25511 Pain in right shoulder: Secondary | ICD-10-CM | POA: Diagnosis not present

## 2016-02-29 ENCOUNTER — Ambulatory Visit: Payer: Medicare Other | Admitting: Family Medicine

## 2016-03-02 DIAGNOSIS — M25611 Stiffness of right shoulder, not elsewhere classified: Secondary | ICD-10-CM | POA: Diagnosis not present

## 2016-03-02 DIAGNOSIS — M25511 Pain in right shoulder: Secondary | ICD-10-CM | POA: Diagnosis not present

## 2016-03-10 DIAGNOSIS — M25511 Pain in right shoulder: Secondary | ICD-10-CM | POA: Diagnosis not present

## 2016-03-10 DIAGNOSIS — M25611 Stiffness of right shoulder, not elsewhere classified: Secondary | ICD-10-CM | POA: Diagnosis not present

## 2016-03-16 ENCOUNTER — Ambulatory Visit (INDEPENDENT_AMBULATORY_CARE_PROVIDER_SITE_OTHER): Payer: Medicare Other | Admitting: Family Medicine

## 2016-03-16 ENCOUNTER — Encounter: Payer: Self-pay | Admitting: Family Medicine

## 2016-03-16 VITALS — BP 140/86 | HR 98 | Temp 98.2°F | Resp 16 | Wt 225.1 lb

## 2016-03-16 DIAGNOSIS — M62838 Other muscle spasm: Secondary | ICD-10-CM | POA: Insufficient documentation

## 2016-03-16 DIAGNOSIS — K219 Gastro-esophageal reflux disease without esophagitis: Secondary | ICD-10-CM

## 2016-03-16 DIAGNOSIS — Z8579 Personal history of other malignant neoplasms of lymphoid, hematopoietic and related tissues: Secondary | ICD-10-CM

## 2016-03-16 DIAGNOSIS — I10 Essential (primary) hypertension: Secondary | ICD-10-CM | POA: Insufficient documentation

## 2016-03-16 DIAGNOSIS — C50911 Malignant neoplasm of unspecified site of right female breast: Secondary | ICD-10-CM

## 2016-03-16 DIAGNOSIS — N62 Hypertrophy of breast: Secondary | ICD-10-CM

## 2016-03-16 DIAGNOSIS — M79601 Pain in right arm: Secondary | ICD-10-CM | POA: Diagnosis not present

## 2016-03-16 DIAGNOSIS — M6248 Contracture of muscle, other site: Secondary | ICD-10-CM | POA: Diagnosis not present

## 2016-03-16 DIAGNOSIS — J309 Allergic rhinitis, unspecified: Secondary | ICD-10-CM | POA: Diagnosis not present

## 2016-03-16 DIAGNOSIS — J3089 Other allergic rhinitis: Secondary | ICD-10-CM

## 2016-03-16 DIAGNOSIS — R739 Hyperglycemia, unspecified: Secondary | ICD-10-CM

## 2016-03-16 DIAGNOSIS — J302 Other seasonal allergic rhinitis: Secondary | ICD-10-CM | POA: Insufficient documentation

## 2016-03-16 DIAGNOSIS — Z1159 Encounter for screening for other viral diseases: Secondary | ICD-10-CM | POA: Diagnosis not present

## 2016-03-16 DIAGNOSIS — H109 Unspecified conjunctivitis: Secondary | ICD-10-CM | POA: Diagnosis not present

## 2016-03-16 DIAGNOSIS — Z9889 Other specified postprocedural states: Secondary | ICD-10-CM | POA: Diagnosis not present

## 2016-03-16 DIAGNOSIS — M170 Bilateral primary osteoarthritis of knee: Secondary | ICD-10-CM | POA: Diagnosis not present

## 2016-03-16 DIAGNOSIS — L304 Erythema intertrigo: Secondary | ICD-10-CM

## 2016-03-16 MED ORDER — OLOPATADINE HCL 0.2 % OP SOLN: 1.0000 [drp] | Freq: Every day | OPHTHALMIC | Status: DC

## 2016-03-16 MED ORDER — MELOXICAM 15 MG PO TABS: 15.0000 mg | ORAL_TABLET | Freq: Every day | ORAL | Status: DC

## 2016-03-16 MED ORDER — PANTOPRAZOLE SODIUM 40 MG PO TBEC: 40.0000 mg | DELAYED_RELEASE_TABLET | Freq: Every day | ORAL | Status: DC

## 2016-03-16 MED ORDER — AMLODIPINE BESYLATE 5 MG PO TABS: 5.0000 mg | ORAL_TABLET | ORAL | Status: DC

## 2016-03-16 MED ORDER — KETOCONAZOLE 2 % EX CREA: 1.0000 "application " | TOPICAL_CREAM | Freq: Two times a day (BID) | CUTANEOUS | Status: DC

## 2016-03-16 MED ORDER — METOPROLOL SUCCINATE ER 50 MG PO TB24: 50.0000 mg | ORAL_TABLET | Freq: Every day | ORAL | Status: DC

## 2016-03-16 MED ORDER — TIZANIDINE HCL 2 MG PO TABS: 2.0000 mg | ORAL_TABLET | Freq: Every day | ORAL | Status: DC

## 2016-03-16 NOTE — Patient Instructions (Signed)
Try stopping Meloxicam and switching to Tylenol 500 mg three times daily

## 2016-03-16 NOTE — Progress Notes (Signed)
Name: Mary Todd   MRN: [id number]    DOB: [birth year]   Date:03/16/2016       Progress Note  Subjective  Chief Complaint  Chief Complaint  Patient presents with  . Medication Refill    HPI  Arm pain: seen by Dr. Thora Lance and diagnosed with bursitis, had a steroid injection on right shoulder and is currently getting PT with mild improvement of symptoms.   Neck pain: she has large breasts, and has noticed worsening on neck pain/spasms . Improves with massage therapy. Pain is described as soreness. PT discussed good posture.   HTN: taking medication as prescribed, no side effects. No chest pain or palpitation. No SOB.   Conjunctivitis: she has a history of AR worse this time of the year, over the past 2 weeks she has noticed that her eyes are matted shut in the am's, no problems during the day. Denies itching. Seen by ophthalmologist in 2017 .   GERD: taking Pantoprazole daily symptoms of heartburn returns when off medication for a few days. No regurgitation  OA: taking Meloxicam daily, explained that it can make GERD worse, she will try switching from Meloxicam to Tylenol 500 mg three times daily   Breast Cancer: sees Dr. Jamal Collin and currently under the care of Dr. Oliva Bustard for her Hodgkins and Breast cancer, still on Arimidex.   Patient Active Problem List   Diagnosis Date Noted  . GERD without esophagitis 03/16/2016  . Neck muscle spasm 03/16/2016  . History of back surgery 03/16/2016  . Osteoarthritis of both knees 03/16/2016  . Large breasts 03/16/2016  . Hypertension, benign 03/16/2016  . Perennial allergic rhinitis with seasonal variation 03/16/2016  . Right arm pain 12/17/2015  . History of pneumococcal pneumonia 09/20/2015  . History of Hodgkin's disease 07/15/2014  . Breast cancer, right ([place]) 06/26/2013    Past Surgical History  Procedure Laterality Date  . Colonoscopy  2003  . Port a cath revision    . Wrist surgery  2005  . Back surgery    .  Abdominal hysterectomy    . Breast surgery Right 2011    right breast lumpectomy,L/SN/R for right breast CA   . Breast biopsy Right 2011    radiation    Family History  Problem Relation Age of Onset  . Cancer Father   . Stroke Brother   . Breast cancer Mother 102    Social History   Social History  . Marital Status: Married    Spouse Name: N/A  . Number of Children: N/A  . Years of Education: N/A   Occupational History  . Not on file.   Social History Main Topics  . Smoking status: Never Smoker   . Smokeless tobacco: Not on file  . Alcohol Use: 0.0 oz/week    0 Standard drinks or equivalent per week     Comment: occas  . Drug Use: No  . Sexual Activity: Not on file   Other Topics Concern  . Not on file   Social History Narrative     Current outpatient prescriptions:  .  amLODipine (NORVASC) 5 MG tablet, Take 1 tablet (5 mg total) by mouth 1 day or 1 dose., Disp: 90 tablet, Rfl: 1 .  anastrozole (ARIMIDEX) 1 MG tablet, Take 1 tablet (1 mg total) by mouth daily., Disp: 90 tablet, Rfl: 3 .  aspirin 81 MG tablet, Take 81 mg by mouth daily., Disp: , Rfl:  .  CALCIUM-MAGNESIUM PO, Take 1 tablet by mouth daily.,  Disp: , Rfl:  .  cetirizine (ZYRTEC) 10 MG tablet, Take 10 mg by mouth daily., Disp: , Rfl:  .  Cholecalciferol (VITAMIN D-3 PO), Take 1 tablet by mouth daily., Disp: , Rfl:  .  meloxicam (MOBIC) 15 MG tablet, Take 1 tablet (15 mg total) by mouth daily., Disp: 90 tablet, Rfl: 1 .  metoprolol succinate (TOPROL-XL) 50 MG 24 hr tablet, Take 1 tablet (50 mg total) by mouth daily., Disp: 90 tablet, Rfl: 0 .  Multiple Vitamin (MULTIVITAMIN) tablet, Take 1 tablet by mouth daily., Disp: , Rfl:  .  pantoprazole (PROTONIX) 40 MG tablet, Take 1 tablet (40 mg total) by mouth daily., Disp: 90 tablet, Rfl: 1 .  ketoconazole (NIZORAL) 2 % cream, Apply 1 application topically 2 (two) times daily., Disp: 60 g, Rfl: 2 .  Olopatadine HCl (PATADAY) 0.2 % SOLN, Apply 1 drop to eye  daily., Disp: 2.5 mL, Rfl: 0 .  tiZANidine (ZANAFLEX) 2 MG tablet, Take 1 tablet (2 mg total) by mouth at bedtime., Disp: 90 tablet, Rfl: 0  Allergies  Allergen Reactions  . Augmentin [Amoxicillin-Pot Clavulanate] Nausea Only     ROS  Constitutional: Negative for fever or weight change.  Respiratory: Negative for cough and shortness of breath.   Cardiovascular: Negative for chest pain or palpitations.  Gastrointestinal: Negative for abdominal pain, no bowel changes.  Musculoskeletal: Negative for gait problem or joint swelling.  Skin: Positive for rash, under breast when sweaty.  Neurological: Negative for dizziness or headache.  No other specific complaints in a complete review of systems (except as listed in HPI above).  Objective  Filed Vitals:   03/16/16 1359  BP: 148/92  Pulse: 98  Temp: 98.2 F (36.8 C)  TempSrc: Oral  Resp: 16  Weight: 225 lb 1.6 oz (102.105 kg)  SpO2: 92%    Body mass index is 37.46 kg/(m^2).  Physical Exam  Constitutional: Patient appears well-developed and well-nourished. Obese No distress.  HEENT: head atraumatic, normocephalic, pupils equal and reactive to light, mild erythema of conjunctiva, no around iris, neck supple, throat within normal limits Cardiovascular: Normal rate, regular rhythm and normal heart sounds.  No murmur heard. No BLE edema. Pulmonary/Chest: Effort normal and breath sounds normal. No respiratory distress. Abdominal: Soft.  There is no tenderness. Psychiatric: Patient has a normal mood and affect. behavior is normal. Judgment and thought content normal. Muscular Skeletal: grinding with right knee extension, some crepitus on left side, neck spasms, normal rom of right shoulder  PHQ2/9: Depression screen [facility] 2/9 03/16/2016 12/17/2015 09/20/2015 08/17/2015 08/04/2015  Decreased Interest 0 0 0 0 0  Down, Depressed, Hopeless 0 0 0 0 0  PHQ - 2 Score 0 0 0 0 0    Fall Risk: Fall Risk  03/16/2016 12/17/2015 10/27/2015  09/20/2015 08/17/2015  Falls in the past year? Yes No No No No  Number falls in past yr: 2 or more - - - -  Injury with Fall? No - - - -     Functional Status Survey: Is the patient deaf or have difficulty hearing?: No Does the patient have difficulty seeing, even when wearing glasses/contacts?: No Does the patient have difficulty concentrating, remembering, or making decisions?: No Does the patient have difficulty walking or climbing stairs?: No Does the patient have difficulty dressing or bathing?: No Does the patient have difficulty doing errands alone such as visiting a doctor's office or shopping?: No    Assessment & Plan  1. Right arm pain  Continue follow  up with Dr. Oliva Bustard  2. Neck muscle spasm  - tiZANidine (ZANAFLEX) 2 MG tablet; Take 1 tablet (2 mg total) by mouth at bedtime.  Dispense: 90 tablet; Refill: 0  3. Malignant neoplasm of right female breast, unspecified site of breast [facility])  Continue follow up with oncologist and surgeon  4. GERD without esophagitis  - pantoprazole (PROTONIX) 40 MG tablet; Take 1 tablet (40 mg total) by mouth daily.  Dispense: 90 tablet; Refill: 1  5. History of back surgery  Stable  6. Primary osteoarthritis of both knees  - meloxicam (MOBIC) 15 MG tablet; Take 1 tablet (15 mg total) by mouth daily.  Dispense: 90 tablet; Refill: 1  7. Large breasts  Trying better posture  8. Intertrigo  - ketoconazole (NIZORAL) 2 % cream; Apply 1 application topically 2 (two) times daily.  Dispense: 60 g; Refill: 2  9. Hypertension, benign  - metoprolol succinate (TOPROL-XL) 50 MG 24 hr tablet; Take 1 tablet (50 mg total) by mouth daily.  Dispense: 90 tablet; Refill: 0 - amLODipine (NORVASC) 5 MG tablet; Take 1 tablet (5 mg total) by mouth 1 day or 1 dose.  Dispense: 90 tablet; Refill: 1 - CBC with Differential/Platelet - Comprehensive metabolic panel  10. Bilateral conjunctivitis   We will try Pataday but if no improvement of  symptoms go to eye doctor  11. Perennial allergic rhinitis with seasonal variation  - Olopatadine HCl (PATADAY) 0.2 % SOLN; Apply 1 drop to eye daily.  Dispense: 2.5 mL; Refill: 0  12. Hyperglycemia  - Hemoglobin A1c  13. Need for hepatitis C screening test  - Hepatitis C antibody  14. History of lymphoma  - CBC with Differential/Platelet

## 2016-03-17 LAB — COMPREHENSIVE METABOLIC PANEL
A/G RATIO: 1.5 (ref 1.2–2.2)
ALBUMIN: 4.4 g/dL (ref 3.6–4.8)
ALK PHOS: 106 IU/L (ref 39–117)
ALT: 25 IU/L (ref 0–32)
AST: 21 IU/L (ref 0–40)
BUN / CREAT RATIO: 27 (ref 12–28)
BUN: 16 mg/dL (ref 8–27)
Bilirubin Total: 0.6 mg/dL (ref 0.0–1.2)
CO2: 24 mmol/L (ref 18–29)
CREATININE: 0.6 mg/dL (ref 0.57–1.00)
Calcium: 9.4 mg/dL (ref 8.7–10.3)
Chloride: 100 mmol/L (ref 96–106)
GFR calc Af Amer: 108 mL/min/{1.73_m2} (ref >59)
GFR, EST NON AFRICAN AMERICAN: 93 mL/min/{1.73_m2} (ref >59)
GLOBULIN, TOTAL: 2.9 g/dL (ref 1.5–4.5)
Glucose: 90 mg/dL (ref 65–99)
POTASSIUM: 4.3 mmol/L (ref 3.5–5.2)
SODIUM: 140 mmol/L (ref 134–144)
Total Protein: 7.3 g/dL (ref 6.0–8.5)

## 2016-03-17 LAB — CBC WITH DIFFERENTIAL/PLATELET
BASOS: 0 %
Basophils Absolute: 0 10*3/uL (ref 0.0–0.2)
EOS (ABSOLUTE): 0.3 10*3/uL (ref 0.0–0.4)
EOS: 4 %
HEMATOCRIT: 39.6 % (ref 34.0–46.6)
HEMOGLOBIN: 13.4 g/dL (ref 11.1–15.9)
Immature Grans (Abs): 0 10*3/uL (ref 0.0–0.1)
Immature Granulocytes: 0 %
LYMPHS ABS: 2.5 10*3/uL (ref 0.7–3.1)
Lymphs: 35 %
MCH: 29.7 pg (ref 26.6–33.0)
MCHC: 33.8 g/dL (ref 31.5–35.7)
MCV: 88 fL (ref 79–97)
MONOCYTES: 8 %
MONOS ABS: 0.5 10*3/uL (ref 0.1–0.9)
NEUTROS ABS: 3.7 10*3/uL (ref 1.4–7.0)
Neutrophils: 53 %
Platelets: 226 10*3/uL (ref 150–379)
RBC: 4.51 x10E6/uL (ref 3.77–5.28)
RDW: 15.3 % (ref 12.3–15.4)
WBC: 7.1 10*3/uL (ref 3.4–10.8)

## 2016-03-17 LAB — HEMOGLOBIN A1C
ESTIMATED AVERAGE GLUCOSE: 128 mg/dL
HEMOGLOBIN A1C: 6.1 % — AB (ref 4.8–5.6)

## 2016-03-17 LAB — HEPATITIS C ANTIBODY: Hep C Virus Ab: <0.1 s/co ratio (ref 0.0–0.9)

## 2016-03-20 DIAGNOSIS — M25511 Pain in right shoulder: Secondary | ICD-10-CM | POA: Diagnosis not present

## 2016-03-20 DIAGNOSIS — M25611 Stiffness of right shoulder, not elsewhere classified: Secondary | ICD-10-CM | POA: Diagnosis not present

## 2016-03-22 DIAGNOSIS — M25511 Pain in right shoulder: Secondary | ICD-10-CM | POA: Diagnosis not present

## 2016-03-22 DIAGNOSIS — M25611 Stiffness of right shoulder, not elsewhere classified: Secondary | ICD-10-CM | POA: Diagnosis not present

## 2016-03-28 ENCOUNTER — Ambulatory Visit
Admission: EM | Admit: 2016-03-28 | Discharge: 2016-03-28 | Disposition: A | Discharge status: Home / Self Care | Payer: Medicare Other | Attending: Family Medicine | Admitting: Family Medicine

## 2016-03-28 ENCOUNTER — Encounter: Payer: Self-pay | Admitting: Emergency Medicine

## 2016-03-28 DIAGNOSIS — Z79899 Other long term (current) drug therapy: Secondary | ICD-10-CM | POA: Diagnosis not present

## 2016-03-28 DIAGNOSIS — Z823 Family history of stroke: Secondary | ICD-10-CM | POA: Diagnosis not present

## 2016-03-28 DIAGNOSIS — K219 Gastro-esophageal reflux disease without esophagitis: Secondary | ICD-10-CM | POA: Diagnosis not present

## 2016-03-28 DIAGNOSIS — Z8571 Personal history of Hodgkin lymphoma: Secondary | ICD-10-CM | POA: Diagnosis not present

## 2016-03-28 DIAGNOSIS — Z7982 Long term (current) use of aspirin: Secondary | ICD-10-CM | POA: Insufficient documentation

## 2016-03-28 DIAGNOSIS — R112 Nausea with vomiting, unspecified: Secondary | ICD-10-CM | POA: Insufficient documentation

## 2016-03-28 DIAGNOSIS — Z803 Family history of malignant neoplasm of breast: Secondary | ICD-10-CM | POA: Diagnosis not present

## 2016-03-28 DIAGNOSIS — R42 Dizziness and giddiness: Secondary | ICD-10-CM | POA: Diagnosis not present

## 2016-03-28 DIAGNOSIS — Z880 Allergy status to penicillin: Secondary | ICD-10-CM | POA: Diagnosis not present

## 2016-03-28 DIAGNOSIS — Z853 Personal history of malignant neoplasm of breast: Secondary | ICD-10-CM | POA: Diagnosis not present

## 2016-03-28 DIAGNOSIS — I10 Essential (primary) hypertension: Secondary | ICD-10-CM | POA: Diagnosis not present

## 2016-03-28 DIAGNOSIS — Z90710 Acquired absence of both cervix and uterus: Secondary | ICD-10-CM | POA: Diagnosis not present

## 2016-03-28 DIAGNOSIS — R110 Nausea: Secondary | ICD-10-CM | POA: Diagnosis present

## 2016-03-28 LAB — CBC WITH DIFFERENTIAL/PLATELET
BASOS ABS: 0.1 10*3/uL (ref 0–0.1)
Eosinophils Absolute: 0.2 10*3/uL (ref 0–0.7)
Eosinophils Relative: 2 %
HEMATOCRIT: 42.1 % (ref 35.0–47.0)
Hemoglobin: 14.2 g/dL (ref 12.0–16.0)
Lymphocytes Relative: 29 %
Lymphs Abs: 3 10*3/uL (ref 1.0–3.6)
MCH: 30.4 pg (ref 26.0–34.0)
MCHC: 33.8 g/dL (ref 32.0–36.0)
MCV: 90 fL (ref 80.0–100.0)
MONO ABS: 0.6 10*3/uL (ref 0.2–0.9)
Monocytes Relative: 6 %
NEUTROS ABS: 6.4 10*3/uL (ref 1.4–6.5)
Platelets: 217 10*3/uL (ref 150–440)
RBC: 4.68 MIL/uL (ref 3.80–5.20)
RDW: 14.3 % (ref 11.5–14.5)
WBC: 10.2 10*3/uL (ref 3.6–11.0)

## 2016-03-28 LAB — BASIC METABOLIC PANEL
ANION GAP: 8 (ref 5–15)
BUN: 13 mg/dL (ref 6–20)
CALCIUM: 9.3 mg/dL (ref 8.9–10.3)
CO2: 27 mmol/L (ref 22–32)
Chloride: 102 mmol/L (ref 101–111)
Creatinine, Ser: 0.68 mg/dL (ref 0.44–1.00)
Glucose, Bld: 112 mg/dL — ABNORMAL HIGH (ref 65–99)
POTASSIUM: 3.6 mmol/L (ref 3.5–5.1)
Sodium: 137 mmol/L (ref 135–145)

## 2016-03-28 MED ORDER — ONDANSETRON 8 MG PO TBDP
8.0000 mg | ORAL_TABLET | Freq: Once | ORAL | Status: AC
  Administered 2016-03-28: 8 mg via ORAL

## 2016-03-28 MED ORDER — MECLIZINE HCL 25 MG PO TABS
25.0000 mg | ORAL_TABLET | Freq: Once | ORAL | Status: AC
  Administered 2016-03-28: 25 mg via ORAL

## 2016-03-28 NOTE — ED Notes (Signed)
Dizzy, diarrhea, nauseated, headache for 2 days

## 2016-03-28 NOTE — ED Provider Notes (Signed)
CSN: [id number]     Arrival date & time 03/28/16  1839 History   First MD Initiated Contact with Patient 03/28/16 1918    Nurses notes were reviewed. Chief Complaint  Patient presents with  . Dizziness  . Nausea   Patient is here because of dizziness and lightheadedness. She reports everything started yesterday. She had recurrent dizziness and nausea. She states that she was fine one moment and she was reaching for something in the closet and that her head up with the dizziness started. She laid down most of yesterday had very little interest in eating anything but then last night which went to the bathroom she experienced the dizziness and lightheadedness again and she's had trouble all day today. Because the dizziness and nausea has persisted she finally came in to seek medical attention.   She is allergic to Augmentin she's had Hodgkin's disease breast cancer abdominal hysterectomy and breast surgery as well. She is currently on medication for breast cancer. She has hypertension but no diabetes. She never smoked. Father with cancer mother breast cancer brothers had a stroke    (Consider location/radiation/quality/duration/timing/severity/associated sxs/prior Treatment) Patient is a 70 y.o. female presenting with dizziness. The history is provided by the patient. No language interpreter was used.  Dizziness Quality:  Head spinning, room spinning and lightheadedness Severity:  Severe Duration:  2 days Timing:  Constant Progression:  Worsening Chronicity:  New Context: head movement and standing up   Context: not with loss of consciousness, not with medication and not with physical activity   Relieved by:  Nothing Associated symptoms: no chest pain, no diarrhea, no headaches, no hearing loss, no palpitations and no shortness of breath     Past Medical History  Diagnosis Date  . Hypertension 2006  . Personal history of malignant neoplasm of breast 2011    right breast  . Esophageal  reflux   . Hodgkin's disease ([place]) 2004  . Breast cancer ([place]) right    2011, T1A N0   Past Surgical History  Procedure Laterality Date  . Colonoscopy  2003  . Port a cath revision    . Wrist surgery  2005  . Back surgery    . Abdominal hysterectomy    . Breast surgery Right 2011    right breast lumpectomy,L/SN/R for right breast CA   . Breast biopsy Right 2011    radiation   Family History  Problem Relation Age of Onset  . Cancer Father   . Stroke Brother   . Breast cancer Mother 31   Social History  Substance Use Topics  . Smoking status: Never Smoker   . Smokeless tobacco: None  . Alcohol Use: 0.0 oz/week    0 Standard drinks or equivalent per week     Comment: occas   OB History    Gravida Para Term Preterm AB TAB SAB Ectopic Multiple Living   2 2        2       Obstetric Comments   1st Menstrual Cycle:  12 1st Pregnancy:  22.     Review of Systems  HENT: Negative for hearing loss.   Respiratory: Negative for cough, shortness of breath and wheezing.   Cardiovascular: Negative for chest pain and palpitations.  Gastrointestinal: Negative for diarrhea.  Neurological: Positive for dizziness. Negative for headaches.  All other systems reviewed and are negative.   Allergies  Augmentin  Home Medications   Prior to Admission medications   Medication Sig Start Date End Date  Taking? Authorizing Provider  amLODipine (NORVASC) 5 MG tablet Take 1 tablet (5 mg total) by mouth 1 day or 1 dose. 03/16/16   Steele Sizer, MD  anastrozole (ARIMIDEX) 1 MG tablet Take 1 tablet (1 mg total) by mouth daily. 08/16/15   Forest Gleason, MD  aspirin 81 MG tablet Take 81 mg by mouth daily.    Historical Provider, MD  CALCIUM-MAGNESIUM PO Take 1 tablet by mouth daily.    Historical Provider, MD  cetirizine (ZYRTEC) 10 MG tablet Take 10 mg by mouth daily.    Historical Provider, MD  Cholecalciferol (VITAMIN D-3 PO) Take 1 tablet by mouth daily.    Historical Provider, MD   ketoconazole (NIZORAL) 2 % cream Apply 1 application topically 2 (two) times daily. 03/16/16   Steele Sizer, MD  meloxicam (MOBIC) 15 MG tablet Take 1 tablet (15 mg total) by mouth daily. 03/16/16   Steele Sizer, MD  metoprolol succinate (TOPROL-XL) 50 MG 24 hr tablet Take 1 tablet (50 mg total) by mouth daily. 03/16/16   Steele Sizer, MD  Multiple Vitamin (MULTIVITAMIN) tablet Take 1 tablet by mouth daily.    Historical Provider, MD  Olopatadine HCl (PATADAY) 0.2 % SOLN Apply 1 drop to eye daily. 03/16/16   Steele Sizer, MD  pantoprazole (PROTONIX) 40 MG tablet Take 1 tablet (40 mg total) by mouth daily. 03/16/16   Steele Sizer, MD  tiZANidine (ZANAFLEX) 2 MG tablet Take 1 tablet (2 mg total) by mouth at bedtime. 03/16/16   Steele Sizer, MD   Meds Ordered and Administered this Visit   Medications  meclizine (ANTIVERT) tablet 25 mg (25 mg Oral Given 03/28/16 1941)  ondansetron (ZOFRAN-ODT) disintegrating tablet 8 mg (8 mg Oral Given 03/28/16 1941)    BP 137/81 mmHg  Pulse 82  Temp(Src) 97.6 F (36.4 C) (Tympanic)  Resp 16  Ht 5\' 5"  (1.651 m)  Wt 225 lb (102.059 kg)  BMI 37.44 kg/m2  SpO2 98% No data found.   Physical Exam  Constitutional: She is oriented to person, place, and time. She appears well-developed and well-nourished.  HENT:  Head: Normocephalic and atraumatic.  Right Ear: External ear normal.  Left Ear: External ear normal.  Nose: Nose normal.  Mouth/Throat: Oropharynx is clear and moist.  Eyes: Conjunctivae are normal. Pupils are equal, round, and reactive to light.  Neck: Normal range of motion. Neck supple. No tracheal deviation present.  Cardiovascular: Normal rate, regular rhythm and normal heart sounds.   Pulmonary/Chest: Effort normal and breath sounds normal. No respiratory distress.  Musculoskeletal: Normal range of motion. She exhibits no tenderness.  Lymphadenopathy:    She has cervical adenopathy.  Neurological: She is alert and oriented to  person, place, and time.  Skin: Skin is warm and dry.  Psychiatric: Her mood appears anxious. She does not exhibit a depressed mood.  Vitals reviewed.   ED Course  Procedures (including critical care time)  Labs Review Labs Reviewed  BASIC METABOLIC PANEL - Abnormal; Notable for the following:    Glucose, Bld 112 (*)    All other components within normal limits  CBC WITH DIFFERENTIAL/PLATELET    Imaging Review No results found.   Visual Acuity Review  Right Eye Distance:   Left Eye Distance:   Bilateral Distance:    Right Eye Near:   Left Eye Near:    Bilateral Near:     Results for orders placed or performed during the hospital encounter of 03/28/16  CBC with Differential  Result Value Ref  Range   WBC 10.2 3.6 - 11.0 K/uL   RBC 4.68 3.80 - 5.20 MIL/uL   Hemoglobin 14.2 12.0 - 16.0 g/dL   HCT 42.1 35.0 - 47.0 %   MCV 90.0 80.0 - 100.0 fL   MCH 30.4 26.0 - 34.0 pg   MCHC 33.8 32.0 - 36.0 g/dL   RDW 14.3 11.5 - 14.5 %   Platelets 217 150 - 440 K/uL   Neutrophils Relative % 62% %   Neutro Abs 6.4 1.4 - 6.5 K/uL   Lymphocytes Relative 29% %   Lymphs Abs 3.0 1.0 - 3.6 K/uL   Monocytes Relative 6% %   Monocytes Absolute 0.6 0.2 - 0.9 K/uL   Eosinophils Relative 2% %   Eosinophils Absolute 0.2 0 - 0.7 K/uL   Basophils Relative 1% %   Basophils Absolute 0.1 0 - 0.1 K/uL  Basic metabolic panel  Result Value Ref Range   Sodium 137 135 - 145 mmol/L   Potassium 3.6 3.5 - 5.1 mmol/L   Chloride 102 101 - 111 mmol/L   CO2 27 22 - 32 mmol/L   Glucose, Bld 112 (H) 65 - 99 mg/dL   BUN 13 6 - 20 mg/dL   Creatinine, Ser 0.68 0.44 - 1.00 mg/dL   Calcium 9.3 8.9 - 10.3 mg/dL   GFR calc non Af Amer >60 >60 mL/min   GFR calc Af Amer >60 >60 mL/min   Anion gap 8 5 - 15      MDM   1. Vertigo   2. Non-intractable vomiting with nausea, vomiting of unspecified type     Epley maneuver did not do anything to improve her condition. Will check a CBC and a BMP and will also  overdose of Antivert after she's had Zofran sublingual for a few minutes.  Several minutes after medication given she did admit that the nausea was much better she still has some dizziness as explained to her that the dizziness can last for several days not weeks. Vertigo is not responding to the Epley's maneuver using may take several days to resolve but he should hopefully get better gradually and recommend bed rest using the Antivert as needed Zofran for nausea. Also reviewed with her her CBC and TMP which were all normal which were more reassuring that this is simple vertigo and recommend following up with her PCP Dr. Laurance Flatten see if not better in a week.   Note: This dictation was prepared with Dragon dictation along with smaller phrase technology. Any transcriptional errors that result from this process are unintentional.  Frederich Cha, MD 03/28/16 2047

## 2016-03-28 NOTE — Discharge Instructions (Signed)
Dizziness Dizziness is a common problem. It makes you feel unsteady or lightheaded. You may feel like you are about to pass out (faint). Dizziness can lead to injury if you stumble or fall. Anyone can get dizzy, but dizziness is more common in older adults. This condition can be caused by a number of things, including:  Medicines.  Dehydration.  Illness. HOME CARE Following these instructions may help with your condition: Eating and Drinking  Drink enough fluid to keep your pee (urine) clear or pale yellow. This helps to keep you from getting dehydrated. Try to drink more clear fluids, such as water.  Do not drink alcohol.  Limit how much caffeine you drink or eat if told by your doctor.  Limit how much salt you drink or eat if told by your doctor. Activity  Avoid making quick movements.  When you stand up from sitting in a chair, steady yourself until you feel okay.  In the morning, first sit up on the side of the bed. When you feel okay, stand slowly while you hold onto something. Do this until you know that your balance is fine.  Move your legs often if you need to stand in one place for a long time. Tighten and relax your muscles in your legs while you are standing.  Do not drive or use heavy machinery if you feel dizzy.  Avoid bending down if you feel dizzy. Place items in your home so that they are easy for you to reach without leaning over. Lifestyle  Do not use any tobacco products, including cigarettes, chewing tobacco, or electronic cigarettes. If you need help quitting, ask your doctor.  Try to lower your stress level, such as with yoga or meditation. Talk with your doctor if you need help. General Instructions  Watch your dizziness for any changes.  Take medicines only as told by your doctor. Talk with your doctor if you think that your dizziness is caused by a medicine that you are taking.  Tell a friend or a family member that you are feeling dizzy. If he or  she notices any changes in your behavior, have this person call your doctor.  Keep all follow-up visits as told by your doctor. This is important. GET HELP IF:  Your dizziness does not go away.  Your dizziness or light-headedness gets worse.  You feel sick to your stomach (nauseous).  You have trouble hearing.  You have new symptoms.  You are unsteady on your feet or you feel like the room is spinning. GET HELP RIGHT AWAY IF:  You throw up (vomit) or have diarrhea and are unable to eat or drink anything.  You have trouble:  Talking.  Walking.  Swallowing.  Using your arms, hands, or legs.  You feel generally weak.  You are not thinking clearly or you have trouble forming sentences. It may take a friend or family member to notice this.  You have:  Chest pain.  Pain in your belly (abdomen).  Shortness of breath.  Sweating.  Your vision changes.  You are bleeding.  You have a headache.  You have neck pain or a stiff neck.  You have a fever.   This information is not intended to replace advice given to you by your health care provider. Make sure you discuss any questions you have with your health care provider.   Document Released: 10/05/2011 Document Revised: 03/02/2015 Document Reviewed: 10/12/2014 Elsevier Interactive Patient Education 2016 Elsevier Inc.  Benign Positional Vertigo Vertigo  is the feeling that you or your surroundings are moving when they are not. Benign positional vertigo is the most common form of vertigo. The cause of this condition is not serious (is benign). This condition is triggered by certain movements and positions (is positional). This condition can be dangerous if it occurs while you are doing something that could endanger you or others, such as driving.  CAUSES In many cases, the cause of this condition is not known. It may be caused by a disturbance in an area of the inner ear that helps your brain to sense movement and  balance. This disturbance can be caused by a viral infection (labyrinthitis), head injury, or repetitive motion. RISK FACTORS This condition is more likely to develop in:  Women.  People who are 58 years of age or older. SYMPTOMS Symptoms of this condition usually happen when you move your head or your eyes in different directions. Symptoms may start suddenly, and they usually last for less than a minute. Symptoms may include:  Loss of balance and falling.  Feeling like you are spinning or moving.  Feeling like your surroundings are spinning or moving.  Nausea and vomiting.  Blurred vision.  Dizziness.  Involuntary eye movement (nystagmus). Symptoms can be mild and cause only slight annoyance, or they can be severe and interfere with daily life. Episodes of benign positional vertigo may return (recur) over time, and they may be triggered by certain movements. Symptoms may improve over time. DIAGNOSIS This condition is usually diagnosed by medical history and a physical exam of the head, neck, and ears. You may be referred to a health care provider who specializes in ear, nose, and throat (ENT) problems (otolaryngologist) or a provider who specializes in disorders of the nervous system (neurologist). You may have additional testing, including:  MRI.  A CT scan.  Eye movement tests. Your health care provider may ask you to change positions quickly while he or she watches you for symptoms of benign positional vertigo, such as nystagmus. Eye movement may be tested with an electronystagmogram (ENG), caloric stimulation, the Dix-Hallpike test, or the roll test.  An electroencephalogram (EEG). This records electrical activity in your brain.  Hearing tests. TREATMENT Usually, your health care provider will treat this by moving your head in specific positions to adjust your inner ear back to normal. Surgery may be needed in severe cases, but this is rare. In some cases, benign positional  vertigo may resolve on its own in 2-4 weeks. HOME CARE INSTRUCTIONS Safety  Move slowly.Avoid sudden body or head movements.  Avoid driving.  Avoid operating heavy machinery.  Avoid doing any tasks that would be dangerous to you or others if a vertigo episode would occur.  If you have trouble walking or keeping your balance, try using a cane for stability. If you feel dizzy or unstable, sit down right away.  Return to your normal activities as told by your health care provider. Ask your health care provider what activities are safe for you. General Instructions  Take over-the-counter and prescription medicines only as told by your health care provider.  Avoid certain positions or movements as told by your health care provider.  Drink enough fluid to keep your urine clear or pale yellow.  Keep all follow-up visits as told by your health care provider. This is important. SEEK MEDICAL CARE IF:  You have a fever.  Your condition gets worse or you develop new symptoms.  Your family or friends notice any behavioral  changes.  Your nausea or vomiting gets worse.  You have numbness or a "pins and needles" sensation. SEEK IMMEDIATE MEDICAL CARE IF:  You have difficulty speaking or moving.  You are always dizzy.  You faint.  You develop severe headaches.  You have weakness in your legs or arms.  You have changes in your hearing or vision.  You develop a stiff neck.  You develop sensitivity to light.   This information is not intended to replace advice given to you by your health care provider. Make sure you discuss any questions you have with your health care provider.   Document Released: 07/24/2006 Document Revised: 07/07/2015 Document Reviewed: 02/08/2015 Elsevier Interactive Patient Education 2016 Elsevier Inc.  Nausea and Vomiting Nausea means you feel sick to your stomach. Throwing up (vomiting) is a reflex where stomach contents come out of your mouth. HOME  CARE   Take medicine as told by your doctor.  Do not force yourself to eat. However, you do need to drink fluids.  If you feel like eating, eat a normal diet as told by your doctor.  Eat rice, wheat, potatoes, bread, lean meats, yogurt, fruits, and vegetables.  Avoid high-fat foods.  Drink enough fluids to keep your pee (urine) clear or pale yellow.  Ask your doctor how to replace body fluid losses (rehydrate). Signs of body fluid loss (dehydration) include:  Feeling very thirsty.  Dry lips and mouth.  Feeling dizzy.  Dark pee.  Peeing less than normal.  Feeling confused.  Fast breathing or heart rate. GET HELP RIGHT AWAY IF:   You have blood in your throw up.  You have black or bloody poop (stool).  You have a bad headache or stiff neck.  You feel confused.  You have bad belly (abdominal) pain.  You have chest pain or trouble breathing.  You do not pee at least once every 8 hours.  You have cold, clammy skin.  You keep throwing up after 24 to 48 hours.  You have a fever. MAKE SURE YOU:   Understand these instructions.  Will watch your condition.  Will get help right away if you are not doing well or get worse.   This information is not intended to replace advice given to you by your health care provider. Make sure you discuss any questions you have with your health care provider.   Document Released: 04/03/2008 Document Revised: 01/08/2012 Document Reviewed: 03/17/2011 Elsevier Interactive Patient Education [employer].

## 2016-03-29 ENCOUNTER — Other Ambulatory Visit: Payer: Self-pay

## 2016-03-29 DIAGNOSIS — C50911 Malignant neoplasm of unspecified site of right female breast: Secondary | ICD-10-CM

## 2016-04-07 DIAGNOSIS — H93293 Other abnormal auditory perceptions, bilateral: Secondary | ICD-10-CM | POA: Diagnosis not present

## 2016-04-07 DIAGNOSIS — H8309 Labyrinthitis, unspecified ear: Secondary | ICD-10-CM | POA: Diagnosis not present

## 2016-04-07 DIAGNOSIS — H903 Sensorineural hearing loss, bilateral: Secondary | ICD-10-CM | POA: Diagnosis not present

## 2016-04-07 DIAGNOSIS — H6123 Impacted cerumen, bilateral: Secondary | ICD-10-CM | POA: Diagnosis not present

## 2016-04-17 DIAGNOSIS — H1013 Acute atopic conjunctivitis, bilateral: Secondary | ICD-10-CM | POA: Diagnosis not present

## 2016-04-20 DIAGNOSIS — H1013 Acute atopic conjunctivitis, bilateral: Secondary | ICD-10-CM | POA: Diagnosis not present

## 2016-05-04 ENCOUNTER — Encounter (INDEPENDENT_AMBULATORY_CARE_PROVIDER_SITE_OTHER): Payer: Self-pay

## 2016-05-04 ENCOUNTER — Ambulatory Visit (INDEPENDENT_AMBULATORY_CARE_PROVIDER_SITE_OTHER): Payer: Medicare Other | Admitting: Family Medicine

## 2016-05-04 ENCOUNTER — Encounter: Payer: Self-pay | Admitting: Family Medicine

## 2016-05-04 VITALS — BP 116/76 | HR 84 | Temp 98.0°F | Resp 16 | Ht 65.0 in | Wt 225.1 lb

## 2016-05-04 DIAGNOSIS — Z23 Encounter for immunization: Secondary | ICD-10-CM | POA: Diagnosis not present

## 2016-05-04 DIAGNOSIS — M5412 Radiculopathy, cervical region: Secondary | ICD-10-CM

## 2016-05-04 MED ORDER — PREDNISONE 10 MG (48) PO TBPK: ORAL_TABLET | Freq: Every day | ORAL | Status: DC

## 2016-05-04 NOTE — Progress Notes (Signed)
Name: Mary Todd   MRN: [id number]    DOB: 08-13-1946   Date:05/04/2016       Progress Note  Subjective  Chief Complaint  Chief Complaint  Patient presents with  . Shoulder Pain    Onset-February on right shoulder and since then has radiated to her neck and right side. Patient states her pain is intermittently and burns, aches and feels sore. Patient has been doing Physical therapy since February with some relief.     HPI  Cervical Radiculitis: she developed right shoulder pain around January 2017, followed by neck pain and was seen by Dr. Mack Guise - had a shoulder injection and arm pain did not improve, she also went for PT to help with neck pain, and pain got progressively worse. She states that for the past few weeks the pain is constant and getting progressively worse, described as aching and burning feeling that goes from neck down to both shoulders, but worse on the right side and radiates down to her right hand. She has noticed some weakness on right hand and has difficulty opening jars and water bottles because of it. Symptoms are worse when sitting up or moving her neck. Pain is worse when raising the right arm. Normal movement of shoulders.  She is currently on muscle relaxer, Tylenol and Meloxicam.   Patient Active Problem List   Diagnosis Date Noted  . GERD without esophagitis 03/16/2016  . Neck muscle spasm 03/16/2016  . History of back surgery 03/16/2016  . Osteoarthritis of both knees 03/16/2016  . Large breasts 03/16/2016  . Hypertension, benign 03/16/2016  . Perennial allergic rhinitis with seasonal variation 03/16/2016  . Right arm pain 12/17/2015  . History of pneumococcal pneumonia 09/20/2015  . History of Hodgkin's disease 07/15/2014  . Breast cancer, right ([place]) 06/26/2013    Past Surgical History  Procedure Laterality Date  . Colonoscopy  2003  . Port a cath revision    . Wrist surgery  2005  . Back surgery    . Abdominal hysterectomy    .  Breast surgery Right 2011    right breast lumpectomy,L/SN/R for right breast CA   . Breast biopsy Right 2011    radiation    Family History  Problem Relation Age of Onset  . Cancer Father   . Stroke Brother   . Breast cancer Mother 53    Social History   Social History  . Marital Status: Married    Spouse Name: N/A  . Number of Children: N/A  . Years of Education: N/A   Occupational History  . Not on file.   Social History Main Topics  . Smoking status: Never Smoker   . Smokeless tobacco: Not on file  . Alcohol Use: 0.0 oz/week    0 Standard drinks or equivalent per week     Comment: occas  . Drug Use: No  . Sexual Activity: Not on file   Other Topics Concern  . Not on file   Social History Narrative     Current outpatient prescriptions:  .  amLODipine (NORVASC) 5 MG tablet, Take 1 tablet (5 mg total) by mouth 1 day or 1 dose., Disp: 90 tablet, Rfl: 1 .  anastrozole (ARIMIDEX) 1 MG tablet, Take 1 tablet (1 mg total) by mouth daily., Disp: 90 tablet, Rfl: 3 .  aspirin 81 MG tablet, Take 81 mg by mouth daily., Disp: , Rfl:  .  CALCIUM-MAGNESIUM PO, Take 1 tablet by mouth daily., Disp: , Rfl:  .  cetirizine (ZYRTEC) 10 MG tablet, Take 10 mg by mouth daily., Disp: , Rfl:  .  Cholecalciferol (VITAMIN D-3 PO), Take 1 tablet by mouth daily., Disp: , Rfl:  .  ketoconazole (NIZORAL) 2 % cream, Apply 1 application topically 2 (two) times daily., Disp: 60 g, Rfl: 2 .  meloxicam (MOBIC) 15 MG tablet, Take 1 tablet (15 mg total) by mouth daily., Disp: 90 tablet, Rfl: 1 .  metoprolol succinate (TOPROL-XL) 50 MG 24 hr tablet, Take 1 tablet (50 mg total) by mouth daily., Disp: 90 tablet, Rfl: 0 .  Multiple Vitamin (MULTIVITAMIN) tablet, Take 1 tablet by mouth daily., Disp: , Rfl:  .  Olopatadine HCl (PATADAY) 0.2 % SOLN, Apply 1 drop to eye daily., Disp: 2.5 mL, Rfl: 0 .  pantoprazole (PROTONIX) 40 MG tablet, Take 1 tablet (40 mg total) by mouth daily., Disp: 90 tablet, Rfl: 1 .   tiZANidine (ZANAFLEX) 2 MG tablet, Take 1 tablet (2 mg total) by mouth at bedtime., Disp: 90 tablet, Rfl: 0 .  predniSONE (STERAPRED UNI-PAK 48 TAB) 10 MG (48) TBPK tablet, Take by mouth daily. Take as directed Stop Meloxicam while taking Prednisone, Disp: 48 tablet, Rfl: 0  Allergies  Allergen Reactions  . Augmentin [Amoxicillin-Pot Clavulanate] Nausea Only     ROS  Ten systems reviewed and is negative except as mentioned in HPI   Objective  Filed Vitals:   05/04/16 0849  BP: 116/76  Pulse: 84  Temp: 98 F (36.7 C)  TempSrc: Oral  Resp: 16  Height: 5' 5"  (1.651 m)  Weight: 225 lb 1.6 oz (102.105 kg)  SpO2: 96%    Body mass index is 37.46 kg/(m^2).  Physical Exam  Constitutional: Patient appears well-developed and well-nourished. Obese  No distress.  HEENT: head atraumatic, normocephalic, pupils equal and reactive to light, throat within normal limits Cardiovascular: Normal rate, regular rhythm and normal heart sounds.  No murmur heard. No BLE edema. Pulmonary/Chest: Effort normal and breath sounds normal. No respiratory distress. Abdominal: Soft.  There is no tenderness. Psychiatric: Patient has a normal mood and affect. behavior is normal. Judgment and thought content normal. Muscular Skeletal: pain with rom of neck,grip 5/5 but seems weaker on right side ( right hand dominant ), normal rom of both shoulders, normal sensation of both arms  Recent Results (from the past 2160 hour(s))  CBC with Differential/Platelet     Status: None   Collection Time: 03/16/16  2:58 PM  Result Value Ref Range   WBC 7.1 3.4 - 10.8 x10E3/uL   RBC 4.51 3.77 - 5.28 x10E6/uL   Hemoglobin 13.4 11.1 - 15.9 g/dL   Hematocrit 39.6 34.0 - 46.6 %   MCV 88 79 - 97 fL   MCH 29.7 26.6 - 33.0 pg   MCHC 33.8 31.5 - 35.7 g/dL   RDW 15.3 12.3 - 15.4 %   Platelets 226 150 - 379 x10E3/uL   Neutrophils 53 %   Lymphs 35 %   Monocytes 8 %   Eos 4 %   Basos 0 %   Neutrophils Absolute 3.7 1.4 - 7.0  x10E3/uL   Lymphocytes Absolute 2.5 0.7 - 3.1 x10E3/uL   Monocytes Absolute 0.5 0.1 - 0.9 x10E3/uL   EOS (ABSOLUTE) 0.3 0.0 - 0.4 x10E3/uL   Basophils Absolute 0.0 0.0 - 0.2 x10E3/uL   Immature Granulocytes 0 %   Immature Grans (Abs) 0.0 0.0 - 0.1 x10E3/uL  Comprehensive metabolic panel     Status: None   Collection Time: 03/16/16  2:58 PM  Result Value Ref Range   Glucose 90 65 - 99 mg/dL   BUN 16 8 - 27 mg/dL   Creatinine, Ser 0.60 0.57 - 1.00 mg/dL   GFR calc non Af Amer 93 >59 mL/min/1.73   GFR calc Af Amer 108 >59 mL/min/1.73   BUN/Creatinine Ratio 27 12 - 28   Sodium 140 134 - 144 mmol/L   Potassium 4.3 3.5 - 5.2 mmol/L   Chloride 100 96 - 106 mmol/L   CO2 24 18 - 29 mmol/L   Calcium 9.4 8.7 - 10.3 mg/dL   Total Protein 7.3 6.0 - 8.5 g/dL   Albumin 4.4 3.6 - 4.8 g/dL   Globulin, Total 2.9 1.5 - 4.5 g/dL   Albumin/Globulin Ratio 1.5 1.2 - 2.2   Bilirubin Total 0.6 0.0 - 1.2 mg/dL   Alkaline Phosphatase 106 39 - 117 IU/L   AST 21 0 - 40 IU/L   ALT 25 0 - 32 IU/L  Hemoglobin A1c     Status: Abnormal   Collection Time: 03/16/16  2:58 PM  Result Value Ref Range   Hgb A1c MFr Bld 6.1 (H) 4.8 - 5.6 %    Comment:          Pre-diabetes: 5.7 - 6.4          Diabetes: >6.4          Glycemic control for adults with diabetes: <7.0    Est. average glucose Bld gHb Est-mCnc 128 mg/dL  Hepatitis C antibody     Status: None   Collection Time: 03/16/16  2:58 PM  Result Value Ref Range   Hep C Virus Ab <0.1 0.0 - 0.9 s/co ratio    Comment:                                   Negative:     < 0.8                              Indeterminate: 0.8 - 0.9                                   Positive:     > 0.9  The CDC recommends that a positive HCV antibody result  be followed up with a HCV Nucleic Acid Amplification  test (056979).   CBC with Differential     Status: None   Collection Time: 03/28/16  7:41 PM  Result Value Ref Range   WBC 10.2 3.6 - 11.0 K/uL   RBC 4.68 3.80 - 5.20 MIL/uL    Hemoglobin 14.2 12.0 - 16.0 g/dL   HCT 42.1 35.0 - 47.0 %   MCV 90.0 80.0 - 100.0 fL   MCH 30.4 26.0 - 34.0 pg   MCHC 33.8 32.0 - 36.0 g/dL   RDW 14.3 11.5 - 14.5 %   Platelets 217 150 - 440 K/uL   Neutrophils Relative % 62% %   Neutro Abs 6.4 1.4 - 6.5 K/uL   Lymphocytes Relative 29% %   Lymphs Abs 3.0 1.0 - 3.6 K/uL   Monocytes Relative 6% %   Monocytes Absolute 0.6 0.2 - 0.9 K/uL   Eosinophils Relative 2% %   Eosinophils Absolute 0.2 0 - 0.7 K/uL   Basophils Relative 1% %   Basophils  Absolute 0.1 0 - 0.1 K/uL  Basic metabolic panel     Status: Abnormal   Collection Time: 03/28/16  7:41 PM  Result Value Ref Range   Sodium 137 135 - 145 mmol/L   Potassium 3.6 3.5 - 5.1 mmol/L   Chloride 102 101 - 111 mmol/L   CO2 27 22 - 32 mmol/L   Glucose, Bld 112 (H) 65 - 99 mg/dL   BUN 13 6 - 20 mg/dL   Creatinine, Ser 0.68 0.44 - 1.00 mg/dL   Calcium 9.3 8.9 - 10.3 mg/dL   GFR calc non Af Amer >60 >60 mL/min   GFR calc Af Amer >60 >60 mL/min    Comment: (NOTE) The eGFR has been calculated using the CKD EPI equation. This calculation has not been validated in all clinical situations. eGFR's persistently <60 mL/min signify possible Chronic Kidney Disease.    Anion gap 8 5 - 15      PHQ2/9: Depression screen [facility] 2/9 03/16/2016 12/17/2015 09/20/2015 08/17/2015 08/04/2015  Decreased Interest 0 0 0 0 0  Down, Depressed, Hopeless 0 0 0 0 0  PHQ - 2 Score 0 0 0 0 0    Fall Risk: Fall Risk  03/16/2016 12/17/2015 10/27/2015 09/20/2015 08/17/2015  Falls in the past year? Yes No No No No  Number falls in past yr: 2 or more - - - -  Injury with Fall? No - - - -     Assessment & Plan  1. Cervical radiculopathy  - MR Cervical Spine Wo Contrast; Future - predniSONE (STERAPRED UNI-PAK 48 TAB) 10 MG (48) TBPK tablet; Take by mouth daily. Take as directed Stop Meloxicam while taking Prednisone  Dispense: 48 tablet; Refill: 0  May increase Zanaflex to up to 2 three times daily   2.  Need for pneumococcal vaccination  - Pneumococcal conjugate vaccine 13-valent IM

## 2016-05-08 ENCOUNTER — Telehealth: Payer: Self-pay

## 2016-05-08 NOTE — Telephone Encounter (Signed)
Can we get the MRI done sooner? Maybe at different location?

## 2016-05-08 NOTE — Telephone Encounter (Signed)
Patient called stating that the pain was still there and that the prednisone is not working like it did before.   Please advise.

## 2016-05-09 NOTE — Telephone Encounter (Signed)
What about [facility]?

## 2016-05-09 NOTE — Telephone Encounter (Signed)
Patient is scheduled for 05/19/16 but they (scheduling dept) told us that patient can call each day to see if there is a cancellation.

## 2016-05-10 ENCOUNTER — Telehealth: Payer: Self-pay

## 2016-05-10 NOTE — Telephone Encounter (Signed)
Patient called asking the status of her message and states the Prednisone makes her sick. Informed her she could call every day for cancellations at [facility] for her MRI to be sooner since her Insurance has already approved the imaging at this location. But patient states she is going out of town on July 17-19th and wants to feel better before leaving can you give her another medication until the imaging on the 21st?

## 2016-05-11 DIAGNOSIS — H029 Unspecified disorder of eyelid: Secondary | ICD-10-CM | POA: Diagnosis not present

## 2016-05-12 ENCOUNTER — Encounter: Payer: Self-pay | Admitting: Family Medicine

## 2016-05-15 NOTE — Telephone Encounter (Signed)
Please call her to find out what is going on. Thank you

## 2016-05-19 ENCOUNTER — Ambulatory Visit
Admission: RE | Admit: 2016-05-19 | Discharge: 2016-05-19 | Disposition: A | Discharge status: Home / Self Care | Payer: Medicare Other | Source: Ambulatory Visit | Attending: Family Medicine | Admitting: Family Medicine

## 2016-05-19 DIAGNOSIS — M5412 Radiculopathy, cervical region: Secondary | ICD-10-CM | POA: Diagnosis not present

## 2016-05-19 DIAGNOSIS — M50322 Other cervical disc degeneration at C5-C6 level: Secondary | ICD-10-CM | POA: Insufficient documentation

## 2016-05-19 DIAGNOSIS — M4802 Spinal stenosis, cervical region: Secondary | ICD-10-CM | POA: Diagnosis not present

## 2016-05-19 DIAGNOSIS — E042 Nontoxic multinodular goiter: Secondary | ICD-10-CM | POA: Insufficient documentation

## 2016-05-19 DIAGNOSIS — M5031 Other cervical disc degeneration,  high cervical region: Secondary | ICD-10-CM | POA: Diagnosis not present

## 2016-05-22 ENCOUNTER — Other Ambulatory Visit: Payer: Self-pay | Admitting: Family Medicine

## 2016-05-22 DIAGNOSIS — E041 Nontoxic single thyroid nodule: Secondary | ICD-10-CM | POA: Insufficient documentation

## 2016-05-22 DIAGNOSIS — M4802 Spinal stenosis, cervical region: Secondary | ICD-10-CM | POA: Insufficient documentation

## 2016-05-23 ENCOUNTER — Telehealth: Payer: Self-pay

## 2016-05-23 ENCOUNTER — Encounter: Payer: Self-pay | Admitting: Family Medicine

## 2016-05-24 ENCOUNTER — Telehealth: Payer: Self-pay | Admitting: Family Medicine

## 2016-05-25 DIAGNOSIS — D1809 Hemangioma of other sites: Secondary | ICD-10-CM | POA: Diagnosis not present

## 2016-05-25 DIAGNOSIS — L820 Inflamed seborrheic keratosis: Secondary | ICD-10-CM | POA: Diagnosis not present

## 2016-05-25 DIAGNOSIS — H029 Unspecified disorder of eyelid: Secondary | ICD-10-CM | POA: Diagnosis not present

## 2016-06-02 NOTE — Telephone Encounter (Signed)
Erroneous Error

## 2016-06-06 DIAGNOSIS — M5020 Other cervical disc displacement, unspecified cervical region: Secondary | ICD-10-CM | POA: Insufficient documentation

## 2016-06-06 DIAGNOSIS — Z6837 Body mass index (BMI) 37.0-37.9, adult: Secondary | ICD-10-CM | POA: Diagnosis not present

## 2016-06-06 DIAGNOSIS — M542 Cervicalgia: Secondary | ICD-10-CM | POA: Diagnosis not present

## 2016-06-06 DIAGNOSIS — M5023 Other cervical disc displacement, cervicothoracic region: Secondary | ICD-10-CM | POA: Diagnosis not present

## 2016-06-20 ENCOUNTER — Ambulatory Visit
Admission: RE | Admit: 2016-06-20 | Discharge: 2016-06-20 | Disposition: A | Discharge status: Home / Self Care | Payer: Medicare Other | Source: Ambulatory Visit | Attending: General Surgery | Admitting: General Surgery

## 2016-06-20 ENCOUNTER — Other Ambulatory Visit: Payer: Self-pay | Admitting: General Surgery

## 2016-06-20 DIAGNOSIS — C50911 Malignant neoplasm of unspecified site of right female breast: Secondary | ICD-10-CM | POA: Diagnosis not present

## 2016-06-20 DIAGNOSIS — R928 Other abnormal and inconclusive findings on diagnostic imaging of breast: Secondary | ICD-10-CM | POA: Diagnosis not present

## 2016-06-21 DIAGNOSIS — M1712 Unilateral primary osteoarthritis, left knee: Secondary | ICD-10-CM | POA: Diagnosis not present

## 2016-06-22 NOTE — Telephone Encounter (Signed)
Please close chart

## 2016-06-26 DIAGNOSIS — E041 Nontoxic single thyroid nodule: Secondary | ICD-10-CM | POA: Diagnosis not present

## 2016-06-27 ENCOUNTER — Ambulatory Visit (INDEPENDENT_AMBULATORY_CARE_PROVIDER_SITE_OTHER): Payer: Medicare Other | Admitting: General Surgery

## 2016-06-27 ENCOUNTER — Encounter: Payer: Self-pay | Admitting: General Surgery

## 2016-06-27 VITALS — BP 134/74 | HR 74 | Resp 14 | Ht 65.0 in | Wt 225.0 lb

## 2016-06-27 DIAGNOSIS — C50211 Malignant neoplasm of upper-inner quadrant of right female breast: Secondary | ICD-10-CM

## 2016-06-27 NOTE — Patient Instructions (Signed)
The patient has been asked to return to the office in one year with a bilateral diagnostic mammogram. 

## 2016-06-27 NOTE — Progress Notes (Signed)
Patient ID: Mary Todd, female   DOB: November 21, 1945, 70 y.o.   MRN: [id number]  Chief Complaint  Patient presents with  . Follow-up    mammogram    HPI Mary Todd is a 70 y.o. female who presents for a breast cancer follow up. The most recent mammogram was done on 06/20/16 .  Patient does perform regular self breast checks and gets regular mammograms done.   I have reviewed the history of present illness with the patient.  HPI  Past Medical History:  Diagnosis Date  . Breast cancer (HCC) right   2011, T1A N0, radiation  . Esophageal reflux   . Hodgkin's disease (HCC) 2004  . Hypertension 2006  . Personal history of malignant neoplasm of breast 2011   right breast    Past Surgical History:  Procedure Laterality Date  . ABDOMINAL HYSTERECTOMY    . BACK SURGERY    . BREAST BIOPSY Right 2011   radiation  . BREAST SURGERY Right 2011   right breast lumpectomy,L/SN/R for right breast CA   . COLONOSCOPY  2003  . PORT A CATH REVISION    . WRIST SURGERY  2005    Family History  Problem Relation Age of Onset  . Cancer Father   . Stroke Brother   . Breast cancer Mother 60    Social History Social History  Substance Use Topics  . Smoking status: Never Smoker  . Smokeless tobacco: Not on file  . Alcohol use 0.0 oz/week     Comment: occas    Allergies  Allergen Reactions  . Augmentin [Amoxicillin-Pot Clavulanate] Nausea Only    Current Outpatient Prescriptions  Medication Sig Dispense Refill  . amLODipine (NORVASC) 5 MG tablet Take 1 tablet (5 mg total) by mouth 1 day or 1 dose. 90 tablet 1  . anastrozole (ARIMIDEX) 1 MG tablet Take 1 tablet (1 mg total) by mouth daily. 90 tablet 3  . aspirin 81 MG tablet Take 81 mg by mouth daily.    Marland Kitchen CALCIUM-MAGNESIUM PO Take 1 tablet by mouth daily.    . cetirizine (ZYRTEC) 10 MG tablet Take 10 mg by mouth daily.    . Cholecalciferol (VITAMIN D-3 PO) Take 1 tablet by mouth daily.    . meloxicam (MOBIC) 15 MG  tablet Take 1 tablet (15 mg total) by mouth daily. 90 tablet 1  . metoprolol succinate (TOPROL-XL) 50 MG 24 hr tablet Take 1 tablet (50 mg total) by mouth daily. 90 tablet 0  . Multiple Vitamin (MULTIVITAMIN) tablet Take 1 tablet by mouth daily.    . pantoprazole (PROTONIX) 40 MG tablet Take 1 tablet (40 mg total) by mouth daily. 90 tablet 1   No current facility-administered medications for this visit.     Review of Systems Review of Systems  Constitutional: Negative.   Respiratory: Negative.   Cardiovascular: Negative.     Blood pressure 134/74, pulse 74, resp. rate 14, height 5\' 5"  (1.651 m), weight 225 lb (102.1 kg).  Physical Exam Physical Exam  Constitutional: She is oriented to person, place, and time. She appears well-developed and well-nourished.  Eyes: Conjunctivae are normal. No scleral icterus.  Neck: Neck supple.  Cardiovascular: Normal rate, regular rhythm and normal heart sounds.   Pulmonary/Chest: Effort normal and breath sounds normal. Right breast exhibits no inverted nipple, no mass, no nipple discharge, no skin change and no tenderness. Left breast exhibits no inverted nipple, no mass, no nipple discharge, no skin change and no tenderness.  Abdominal:  Soft. Normal appearance and bowel sounds are normal. There is no hepatomegaly. There is no tenderness. No hernia.  Lymphadenopathy:    She has no cervical adenopathy.    She has no axillary adenopathy.  Neurological: She is alert and oriented to person, place, and time.  Skin: Skin is warm and dry.    Data Reviewed Mammogram reviewed and stable  Assessment    Right breast invasive CA, 72yrs post treatment. Pt is continued on antihormonal therapy   Overall doing very well.  Plan    The patient has been asked to return to the office in one year with a bilateral diagnostic mammogram.     This information has been scribed by Ples Specter CMA.   Dina Warbington G 06/27/2016, 4:40 PM

## 2016-06-30 DIAGNOSIS — E041 Nontoxic single thyroid nodule: Secondary | ICD-10-CM | POA: Diagnosis not present

## 2016-07-04 DIAGNOSIS — M1712 Unilateral primary osteoarthritis, left knee: Secondary | ICD-10-CM | POA: Diagnosis not present

## 2016-07-04 DIAGNOSIS — E042 Nontoxic multinodular goiter: Secondary | ICD-10-CM | POA: Diagnosis not present

## 2016-07-10 DIAGNOSIS — M542 Cervicalgia: Secondary | ICD-10-CM | POA: Diagnosis not present

## 2016-07-10 DIAGNOSIS — M5023 Other cervical disc displacement, cervicothoracic region: Secondary | ICD-10-CM | POA: Diagnosis not present

## 2016-07-11 DIAGNOSIS — M1712 Unilateral primary osteoarthritis, left knee: Secondary | ICD-10-CM | POA: Diagnosis not present

## 2016-07-14 DIAGNOSIS — E042 Nontoxic multinodular goiter: Secondary | ICD-10-CM | POA: Diagnosis not present

## 2016-07-17 ENCOUNTER — Ambulatory Visit: Payer: Medicare Other | Admitting: Hematology and Oncology

## 2016-07-17 ENCOUNTER — Other Ambulatory Visit: Payer: Self-pay | Admitting: *Deleted

## 2016-07-17 ENCOUNTER — Other Ambulatory Visit: Payer: Medicare Other

## 2016-07-17 ENCOUNTER — Ambulatory Visit: Payer: Medicare Other | Admitting: Oncology

## 2016-07-17 ENCOUNTER — Ambulatory Visit (INDEPENDENT_AMBULATORY_CARE_PROVIDER_SITE_OTHER): Payer: Medicare Other | Admitting: Family Medicine

## 2016-07-17 ENCOUNTER — Encounter: Payer: Self-pay | Admitting: Family Medicine

## 2016-07-17 VITALS — BP 140/80 | HR 93 | Temp 98.4°F | Resp 18 | Ht 65.0 in | Wt 226.4 lb

## 2016-07-17 DIAGNOSIS — I10 Essential (primary) hypertension: Secondary | ICD-10-CM | POA: Diagnosis not present

## 2016-07-17 DIAGNOSIS — Z13220 Encounter for screening for lipoid disorders: Secondary | ICD-10-CM | POA: Diagnosis not present

## 2016-07-17 DIAGNOSIS — M5412 Radiculopathy, cervical region: Secondary | ICD-10-CM | POA: Diagnosis not present

## 2016-07-17 DIAGNOSIS — Z23 Encounter for immunization: Secondary | ICD-10-CM | POA: Diagnosis not present

## 2016-07-17 DIAGNOSIS — M170 Bilateral primary osteoarthritis of knee: Secondary | ICD-10-CM

## 2016-07-17 DIAGNOSIS — R7303 Prediabetes: Secondary | ICD-10-CM

## 2016-07-17 DIAGNOSIS — C50911 Malignant neoplasm of unspecified site of right female breast: Secondary | ICD-10-CM | POA: Diagnosis not present

## 2016-07-17 DIAGNOSIS — M4802 Spinal stenosis, cervical region: Secondary | ICD-10-CM | POA: Diagnosis not present

## 2016-07-17 DIAGNOSIS — E041 Nontoxic single thyroid nodule: Secondary | ICD-10-CM

## 2016-07-17 DIAGNOSIS — K219 Gastro-esophageal reflux disease without esophagitis: Secondary | ICD-10-CM | POA: Diagnosis not present

## 2016-07-17 DIAGNOSIS — Z853 Personal history of malignant neoplasm of breast: Secondary | ICD-10-CM

## 2016-07-17 MED ORDER — MELOXICAM 15 MG PO TABS: 15.0000 mg | ORAL_TABLET | Freq: Every day | ORAL | 1 refills | Status: DC

## 2016-07-17 MED ORDER — PANTOPRAZOLE SODIUM 40 MG PO TBEC: 40.0000 mg | DELAYED_RELEASE_TABLET | Freq: Every day | ORAL | 1 refills | Status: DC

## 2016-07-17 MED ORDER — RANITIDINE HCL 150 MG PO TABS: 150.0000 mg | ORAL_TABLET | Freq: Two times a day (BID) | ORAL | 0 refills | Status: DC

## 2016-07-17 MED ORDER — METOPROLOL SUCCINATE ER 50 MG PO TB24: 50.0000 mg | ORAL_TABLET | Freq: Every day | ORAL | 0 refills | Status: DC

## 2016-07-17 MED ORDER — METFORMIN HCL ER 750 MG PO TB24: 750.0000 mg | ORAL_TABLET | Freq: Every day | ORAL | 0 refills | Status: DC

## 2016-07-17 MED ORDER — AMLODIPINE BESYLATE 5 MG PO TABS: 5.0000 mg | ORAL_TABLET | ORAL | 1 refills | Status: DC

## 2016-07-17 NOTE — Progress Notes (Addendum)
Name: Mary Todd   MRN: [id number]    DOB: 1946/02/01   Date:07/17/2016       Progress Note  Subjective  Chief Complaint  Chief Complaint  Patient presents with  . Follow-up  . Medication Refill    HPI  Cervical Radiculitis: she developed right shoulder pain around January 2017, followed by neck pain and was seen by Dr. Mack Guise - had a shoulder injection and arm pain did not improve, she also went for PT to help with neck pain, and pain got progressively worse. She states that for the past couple of months the pain was constant and getting progressively worse, described as aching and burning feeling that goes from neck down to both shoulders, but worse on the right side and radiates down to her right hand. She has noticed some weakness on right hand and has difficulty opening jars and water bottles because of it. MRI c-spine showed stenosis and herniated disk, she was referred to neurosurgeon and Dr. Saintclair Halsted referred to the pain management side and she had one steroid injection, pain is still constant, but not as intense, and the burning sensation has resolved, she still has some weakness on right hand.   HTN: taking medication as prescribed, no side effects. No chest pain or palpitation. No SOB. She states bp at home has been controlled and she would like to hold off on changing her bp medications at this time  GERD: taking Pantoprazole daily symptoms of heartburn returns when off medication for a few days. No regurgitation. Discussed possible side effects of long term use of PPI  OA: taking Meloxicam daily, explained that it can make GERD worse, she will tried  switching from Meloxicam to Tylenol 500 mg three times daily , but it did not control symptoms  Breast Cancer: sees Dr. Jamal Collin and currently under the care of Dr. Mike Gip  for her Hodgkins and Breast cancer, still on Arimidex.  Thyroid nodules: incidental finding on CT neck , had biopsy last week by Dr. Gabriel Carina and results  are pending  Obesity: she has  Long history of obesity, states that she gets very hungry at night, eating dinner around 6 , but goes to bed after midnight, skips meals, she states she drinks mostly water.  Patient Active Problem List   Diagnosis Date Noted  . Spinal stenosis in cervical region 05/22/2016  . Thyroid nodule 05/22/2016  . GERD without esophagitis 03/16/2016  . Neck muscle spasm 03/16/2016  . History of back surgery 03/16/2016  . Osteoarthritis of both knees 03/16/2016  . Large breasts 03/16/2016  . Hypertension, benign 03/16/2016  . Perennial allergic rhinitis with seasonal variation 03/16/2016  . History of pneumococcal pneumonia 09/20/2015  . Hodgkin disease ([place]) 07/15/2014  . Breast cancer, right ([place]) 06/26/2013    Past Surgical History:  Procedure Laterality Date  . ABDOMINAL HYSTERECTOMY    . BACK SURGERY    . BREAST BIOPSY Right 2011   radiation  . BREAST SURGERY Right 2011   right breast lumpectomy,L/SN/R for right breast CA   . COLONOSCOPY  2003  . PORT A CATH REVISION    . WRIST SURGERY  2005    Family History  Problem Relation Age of Onset  . Cancer Father   . Stroke Brother   . Breast cancer Mother 18    Social History   Social History  . Marital status: Married    Spouse name: N/A  . Number of children: N/A  . Years of education:  N/A   Occupational History  . Not on file.   Social History Main Topics  . Smoking status: Never Smoker  . Smokeless tobacco: Former Systems developer  . Alcohol use 0.0 oz/week     Comment: occas  . Drug use: No  . Sexual activity: Not on file   Other Topics Concern  . Not on file   Social History Narrative  . No narrative on file     Current Outpatient Prescriptions:  .  amLODipine (NORVASC) 5 MG tablet, Take 1 tablet (5 mg total) by mouth 1 day or 1 dose., Disp: 90 tablet, Rfl: 1 .  anastrozole (ARIMIDEX) 1 MG tablet, Take 1 tablet (1 mg total) by mouth daily., Disp: 90 tablet, Rfl: 3 .  aspirin 81 MG  tablet, Take 81 mg by mouth daily., Disp: , Rfl:  .  CALCIUM-MAGNESIUM PO, Take 1 tablet by mouth daily., Disp: , Rfl:  .  cetirizine (ZYRTEC) 10 MG tablet, Take 10 mg by mouth daily., Disp: , Rfl:  .  Cholecalciferol (VITAMIN D-3 PO), Take 1 tablet by mouth daily., Disp: , Rfl:  .  meloxicam (MOBIC) 15 MG tablet, Take 1 tablet (15 mg total) by mouth daily., Disp: 90 tablet, Rfl: 1 .  metoprolol succinate (TOPROL-XL) 50 MG 24 hr tablet, Take 1 tablet (50 mg total) by mouth daily., Disp: 90 tablet, Rfl: 0 .  Multiple Vitamin (MULTIVITAMIN) tablet, Take 1 tablet by mouth daily., Disp: , Rfl:  .  pantoprazole (PROTONIX) 40 MG tablet, Take 1 tablet (40 mg total) by mouth daily., Disp: 90 tablet, Rfl: 1  Allergies  Allergen Reactions  . Augmentin [Amoxicillin-Pot Clavulanate] Nausea Only     ROS  Constitutional: Negative for fever or weight change.  Respiratory: Negative for cough and shortness of breath.   Cardiovascular: Negative for chest pain or palpitations.  Gastrointestinal: Negative for abdominal pain, no bowel changes.  Musculoskeletal: Positive for gait problem ( secondary to knee pain ) or joint swelling.  Skin: Negative for rash.  Neurological: Negative for dizziness or headache.  No other specific complaints in a complete review of systems (except as listed in HPI above).  Objective  Vitals:   07/17/16 0944  BP: 140/80  Pulse: 93  Resp: 18  Temp: 98.4 F (36.9 C)  TempSrc: Oral  SpO2: 97%  Weight: 226 lb 6 oz (102.7 kg)  Height: 5\' 5"  (1.651 m)    Body mass index is 37.67 kg/m.  Physical Exam  Constitutional: Patient appears well-developed and well-nourished. Obese  No distress.  HEENT: head atraumatic, normocephalic, pupils equal and reactive to light,  neck supple, throat within normal limits Cardiovascular: Normal rate, regular rhythm and normal heart sounds.  No murmur heard. No BLE edema. Pulmonary/Chest: Effort normal and breath sounds normal. No  respiratory distress. Abdominal: Soft.  There is no tenderness. Psychiatric: Patient has a normal mood and affect. behavior is normal. Judgment and thought content normal. Muscular Skeletal: crepitus with extension of left knee  PHQ2/9: Depression screen [facility] 2/9 07/17/2016 03/16/2016 12/17/2015 09/20/2015 08/17/2015  Decreased Interest 0 0 0 0 0  Down, Depressed, Hopeless 0 0 0 0 0  PHQ - 2 Score 0 0 0 0 0     Fall Risk: Fall Risk  07/17/2016 03/16/2016 12/17/2015 10/27/2015 09/20/2015  Falls in the past year? No Yes No No No  Number falls in past yr: - 2 or more - - -  Injury with Fall? - No - - -  Functional Status Survey: Is the patient deaf or have difficulty hearing?: No Does the patient have difficulty seeing, even when wearing glasses/contacts?: Yes Does the patient have difficulty concentrating, remembering, or making decisions?: No Does the patient have difficulty walking or climbing stairs?: No Does the patient have difficulty dressing or bathing?: No Does the patient have difficulty doing errands alone such as visiting a doctor's office or shopping?: No    Assessment & Plan  1. Cervical radiculopathy  Continue follow up with Dr. Saintclair Halsted  2. Needs flu shot  - Flu vaccine HIGH DOSE PF (Fluzone High dose)  3. Hypertension, benign  - metoprolol succinate (TOPROL-XL) 50 MG 24 hr tablet; Take 1 tablet (50 mg total) by mouth daily.  Dispense: 90 tablet; Refill: 0 - amLODipine (NORVASC) 5 MG tablet; Take 1 tablet (5 mg total) by mouth 1 day or 1 dose.  Dispense: 90 tablet; Refill: 1  4. Thyroid nodule  Continue follow up with Dr. Gabriel Carina  5. Spinal stenosis in cervical region  Still having symptoms  6. GERD without esophagitis  - pantoprazole (PROTONIX) 40 MG tablet; Take 1 tablet (40 mg total) by mouth daily.  Dispense: 90 tablet; Refill: 1 She will try weaning off Protonix, but adding ranitidine  7. Malignant neoplasm of right female breast, unspecified site  of breast [facility])  Keep follow up with Dr. Jamal Collin and Mike Gip  8. Primary osteoarthritis of both knees  - meloxicam (MOBIC) 15 MG tablet; Take 1 tablet (15 mg total) by mouth daily.  Dispense: 90 tablet; Refill: 1  9. Lipid screening  -lipid panel   10. Pre-diabetes  -hgbA1C - metFORMIN (GLUCOPHAGE-XR) 750 MG 24 hr tablet; Take 1 tablet (750 mg total) by mouth daily with breakfast.  Dispense: 90 tablet; Refill: 0  11. Morbid obesity, unspecified obesity type ([place])  - metFORMIN (GLUCOPHAGE-XR) 750 MG 24 hr tablet; Take 1 tablet (750 mg total) by mouth daily with breakfast.  Dispense: 90 tablet; Refill: 0

## 2016-07-17 NOTE — Addendum Note (Signed)
Addended by: Steele Sizer F on: 07/17/2016 10:35 AM   Modules accepted: Orders

## 2016-07-18 DIAGNOSIS — M1712 Unilateral primary osteoarthritis, left knee: Secondary | ICD-10-CM | POA: Diagnosis not present

## 2016-07-19 ENCOUNTER — Telehealth: Payer: Self-pay | Admitting: Hematology and Oncology

## 2016-07-19 ENCOUNTER — Inpatient Hospital Stay: Payer: Medicare Other | Attending: Hematology and Oncology

## 2016-07-19 ENCOUNTER — Inpatient Hospital Stay (HOSPITAL_BASED_OUTPATIENT_CLINIC_OR_DEPARTMENT_OTHER): Payer: Medicare Other | Admitting: Hematology and Oncology

## 2016-07-19 ENCOUNTER — Encounter: Payer: Self-pay | Admitting: Hematology and Oncology

## 2016-07-19 ENCOUNTER — Other Ambulatory Visit: Payer: Self-pay | Admitting: *Deleted

## 2016-07-19 VITALS — BP 128/87 | HR 77 | Temp 97.9°F | Resp 18 | Wt 227.5 lb

## 2016-07-19 DIAGNOSIS — Z780 Asymptomatic menopausal state: Secondary | ICD-10-CM

## 2016-07-19 DIAGNOSIS — K219 Gastro-esophageal reflux disease without esophagitis: Secondary | ICD-10-CM

## 2016-07-19 DIAGNOSIS — Z170 Estrogen receptor positive status [ER+]: Secondary | ICD-10-CM

## 2016-07-19 DIAGNOSIS — C50911 Malignant neoplasm of unspecified site of right female breast: Secondary | ICD-10-CM

## 2016-07-19 DIAGNOSIS — Z9223 Personal history of estrogen therapy: Secondary | ICD-10-CM | POA: Diagnosis not present

## 2016-07-19 DIAGNOSIS — Z809 Family history of malignant neoplasm, unspecified: Secondary | ICD-10-CM | POA: Insufficient documentation

## 2016-07-19 DIAGNOSIS — Z7982 Long term (current) use of aspirin: Secondary | ICD-10-CM | POA: Insufficient documentation

## 2016-07-19 DIAGNOSIS — M8580 Other specified disorders of bone density and structure, unspecified site: Secondary | ICD-10-CM

## 2016-07-19 DIAGNOSIS — Z79899 Other long term (current) drug therapy: Secondary | ICD-10-CM | POA: Diagnosis not present

## 2016-07-19 DIAGNOSIS — Z853 Personal history of malignant neoplasm of breast: Secondary | ICD-10-CM

## 2016-07-19 DIAGNOSIS — Z923 Personal history of irradiation: Secondary | ICD-10-CM | POA: Insufficient documentation

## 2016-07-19 DIAGNOSIS — Z803 Family history of malignant neoplasm of breast: Secondary | ICD-10-CM | POA: Diagnosis not present

## 2016-07-19 DIAGNOSIS — C8193 Hodgkin lymphoma, unspecified, intra-abdominal lymph nodes: Secondary | ICD-10-CM

## 2016-07-19 DIAGNOSIS — Z8571 Personal history of Hodgkin lymphoma: Secondary | ICD-10-CM | POA: Diagnosis not present

## 2016-07-19 DIAGNOSIS — Z9221 Personal history of antineoplastic chemotherapy: Secondary | ICD-10-CM | POA: Diagnosis not present

## 2016-07-19 DIAGNOSIS — I10 Essential (primary) hypertension: Secondary | ICD-10-CM

## 2016-07-19 DIAGNOSIS — M179 Osteoarthritis of knee, unspecified: Secondary | ICD-10-CM

## 2016-07-19 LAB — CBC WITH DIFFERENTIAL/PLATELET
Basophils Absolute: 0.1 10*3/uL (ref 0–0.1)
Basophils Relative: 1 %
Eosinophils Absolute: 0.2 10*3/uL (ref 0–0.7)
Eosinophils Relative: 3 %
HCT: 39.7 % (ref 35.0–47.0)
Hemoglobin: 13.3 g/dL (ref 12.0–16.0)
Lymphocytes Relative: 33 %
Lymphs Abs: 2.1 10*3/uL (ref 1.0–3.6)
MCH: 29.8 pg (ref 26.0–34.0)
MCHC: 33.5 g/dL (ref 32.0–36.0)
MCV: 89.1 fL (ref 80.0–100.0)
Monocytes Absolute: 0.8 10*3/uL (ref 0.2–0.9)
Monocytes Relative: 12 %
Neutro Abs: 3.1 10*3/uL (ref 1.4–6.5)
Neutrophils Relative %: 50 %
Platelets: 211 10*3/uL (ref 150–440)
RBC: 4.46 MIL/uL (ref 3.80–5.20)
RDW: 14.5 % (ref 11.5–14.5)
WBC: 6.2 10*3/uL (ref 3.6–11.0)

## 2016-07-19 LAB — COMPREHENSIVE METABOLIC PANEL
ALT: 22 U/L (ref 14–54)
AST: 19 U/L (ref 15–41)
Albumin: 4.1 g/dL (ref 3.5–5.0)
Alkaline Phosphatase: 90 U/L (ref 38–126)
Anion gap: 9 (ref 5–15)
BUN: 13 mg/dL (ref 6–20)
CO2: 24 mmol/L (ref 22–32)
Calcium: 9.4 mg/dL (ref 8.9–10.3)
Chloride: 103 mmol/L (ref 101–111)
Creatinine, Ser: 0.58 mg/dL (ref 0.44–1.00)
GFR calc Af Amer: >60 mL/min (ref >60)
GFR calc non Af Amer: >60 mL/min (ref >60)
Glucose, Bld: 109 mg/dL — ABNORMAL HIGH (ref 65–99)
Potassium: 4 mmol/L (ref 3.5–5.1)
Sodium: 136 mmol/L (ref 135–145)
Total Bilirubin: 1 mg/dL (ref 0.3–1.2)
Total Protein: 7.5 g/dL (ref 6.5–8.1)

## 2016-07-19 LAB — LACTATE DEHYDROGENASE: LDH: 144 U/L (ref 98–192)

## 2016-07-19 NOTE — Telephone Encounter (Signed)
Mary Todd called back to set up [facility] and informed us that the existing order is outdated and was entered by [facility]. They need more recent order and one signed by current doctor. Thanks.

## 2016-07-19 NOTE — Progress Notes (Signed)
Patient here today regarding follow up regarding breast cancer.  Former patient of Dr. Cynda Acres.  Saw Dr. Oliva Bustard  Most recently.

## 2016-07-19 NOTE — Telephone Encounter (Signed)
I have entered a new order and cancelled the old one under choksi name

## 2016-07-19 NOTE — Progress Notes (Signed)
[facility] day:  07/19/2016  Chief Complaint: Mary Todd is a 70 y.o. female with stage II Hodgkin's disease (2004) and stage IA right breast cancer (2011) who is seen for reassessment.  HPI: The patient states that her Hodgkin's disease was discovered because of low counts noted by the rheumatologist office.  She had joint pain, muscle aches, and low back pain radiating into buttocks and legs.  MRI revealed disk bulge.  ESR was high.  ANA was 1:320.  She had anemia and lymphopenia.  Negative studies included:  HIV, CEA, CA125.  She had a positive Coombs in 07/2003.  Iron studies were low.  Colonoscopy was negative.  Abdomen and pelvic CT scan as well as PET scan revealed pelvic mass and retroperitoneal lymphadenopathy.  Biopsy revealed Hodgkin's disease.  Treatment plan was Rituxan x 6 completed, and ABVD times 12 for 6 cycles, without radiation.   Baseline pulmonary disease with an impaired DLCO 67% while anemic.  Later DLCO was baseline 80% predicted when anemia corrected then 64% on 09/2014.   She received ABVD times 5 through cycle 3-A and then AVD without the Bleomycin times 7 subsequent cycles.  Treatment completed 12/2003.    CT scans and PET scans were negative after 6 treatments through cycle 3-B. PET was abnormal 02/2004 and then normal 04/2004.   In 08/2004 PET scan had an abnormality but CT normal. PET and CT were normal in 10/2004 and 11/2004.  There was a new equivocal area in the left neck on PET scan, in 06/2005, but with negative CT scan.   There were small nodes neck by CT 08/2005, but PET negative. CT was negative in 11/2005.  PET scan was negative 12/2005.  CT and PET negative on 05/2006  and 06/2006.  There was a new parenchymal and old pleural-based density on CT in 11/2006.  PET scan was negative in 12/2006.  She has had no recurrence of her lymphoma.  She underwent right breast lumpectomy and sentinel lymph node biopsy by  Dr. Jamal Collin on 07/21/2010.  Pathology revealed a 0.5 cm grade II invasive ductal carcinoma.  Two sentinel lymph nodes were negative.  Tumor was ER + (>90%), PR + (40%), and Her2/neu 1+.  Pathologic stage was T1aN0Mx.  She received Mammosite radiation.  Oncotype revealed a low risk lesion.  BRCA was negative.  She received 5 years of an aromatase inhibitor.  Breast cancer index (BCI) testing revealed a 6.3% (CI: 2.9%-9.6%) risk of late recurrent disease (years 5-10) after 5 years of hormonal therapy and a high likelihood of benefit from continued hormonal therapy (30% reduction).  Decision was made to continue Arimidex for 5 more years.  Bone density study on 09/05/2011 revealed osteopenia with a  T-score of -1.7 in L1-L4.  Bone density study on 11/14/2012 revealed osteopenia with a  T-score of -1.5 in L1-L4.  The patient was last seen by Dr. Oliva Bustard on 08/16/2015.  At that time, she was doing well.  Bilateral mammogram on 06/20/2016 revealed no evidence of recurrent disease.  The patient was seen by Dr. Jamal Collin on 06/27/2016.  At that time, she was doing well.  She has a follow-up in 1 year.  Symptomatically, she is doing well.  She had a ruptured disk documented in 12/2015.  She has thyroid nodules being biopsied.  She has arthritis in her knee.  She denies any B symptoms.  She denies any breast concerns.    Past Medical History:  Diagnosis Date  . Breast cancer ([place]) right   2011, T1A N0, radiation  . Esophageal reflux   . Hodgkin's disease ([place]) 2004  . Hypertension 2006  . Personal history of malignant neoplasm of breast 2011   right breast    Past Surgical History:  Procedure Laterality Date  . ABDOMINAL HYSTERECTOMY    . BACK SURGERY    . BREAST BIOPSY Right 2011   radiation  . BREAST SURGERY Right 2011   right breast lumpectomy,L/SN/R for right breast CA   . COLONOSCOPY  2003  . PORT A CATH REVISION    . WRIST SURGERY  2005    Family History  Problem Relation Age of Onset  .  Cancer Father   . Stroke Brother   . Breast cancer Mother 20    Social History:  reports that she has never smoked. She has quit using smokeless tobacco. She reports that she drinks alcohol. She reports that she does not use drugs.  She is retired.  She previously worked for [employer].  The patient is alone today.  Allergies:  Allergies  Allergen Reactions  . Augmentin [Amoxicillin-Pot Clavulanate] Nausea Only    Current Medications: Current Outpatient Prescriptions  Medication Sig Dispense Refill  . amLODipine (NORVASC) 5 MG tablet Take 1 tablet (5 mg total) by mouth 1 day or 1 dose. 90 tablet 1  . anastrozole (ARIMIDEX) 1 MG tablet Take 1 tablet (1 mg total) by mouth daily. 90 tablet 3  . aspirin 81 MG tablet Take 81 mg by mouth daily.    Marland Kitchen CALCIUM-MAGNESIUM PO Take 1 tablet by mouth daily.    . cetirizine (ZYRTEC) 10 MG tablet Take 10 mg by mouth daily.    . Cholecalciferol (VITAMIN D-3 PO) Take 1 tablet by mouth daily.    . meloxicam (MOBIC) 15 MG tablet Take 1 tablet (15 mg total) by mouth daily. 90 tablet 1  . metFORMIN (GLUCOPHAGE-XR) 750 MG 24 hr tablet Take 1 tablet (750 mg total) by mouth daily with breakfast. 90 tablet 0  . metoprolol succinate (TOPROL-XL) 50 MG 24 hr tablet Take 1 tablet (50 mg total) by mouth daily. 90 tablet 0  . Multiple Vitamin (MULTIVITAMIN) tablet Take 1 tablet by mouth daily.    . pantoprazole (PROTONIX) 40 MG tablet Take 1 tablet (40 mg total) by mouth daily. 90 tablet 1  . ranitidine (ZANTAC) 150 MG tablet Take 1 tablet (150 mg total) by mouth 2 (two) times daily. Trying to wean off Pantoprazole 180 tablet 0   No current facility-administered medications for this visit.     Review of Systems:  GENERAL:  Feels well.  Active.  No fevers, sweats or weight loss. PERFORMANCE STATUS (ECOG):  1 HEENT:  No visual changes, runny nose, sore throat, mouth sores or tenderness. Lungs: No shortness of breath or cough.  No  hemoptysis. Cardiac:  No chest pain, palpitations, orthopnea, or PND. GI:  No nausea, vomiting, diarrhea, constipation, melena or hematochezia.  Colonoscopy on 06/26/2012. GU:  No urgency, frequency, dysuria, or hematuria. Musculoskeletal:  Ruptured disk.  Arthritis in knee x 3 injections.  No muscle tenderness. Extremities:  No pain or swelling. Skin:  No rashes or skin changes. Neuro:  No headache, numbness or weakness, balance or coordination issues. Endocrine:  Nodules on thyroid (u/s and biopsy on Friday).  No diabetes, hot flashes or night sweats. Psych:  No mood changes, depression or anxiety. Pain:  No focal pain. Review of systems:  All other systems reviewed and found to be negative.  Physical Exam: Blood pressure 128/87, pulse 77, temperature 97.9 F (36.6 C), temperature source Tympanic, resp. rate 18, weight 227 lb 8.2 oz (103.2 kg). GENERAL:  Well developed, well nourished, woman sitting comfortably in the exam room in no acute distress. MENTAL STATUS:  Alert and oriented to person, place and time. HEAD:  Short light brown hair.  Normocephalic, atraumatic, face symmetric, no Cushingoid features. EYES:  Glasses.  Brown eyes.  Pupils equal round and reactive to light and accomodation.  No conjunctivitis or scleral icterus. ENT:  Oropharynx clear without lesion.  Tongue normal. Mucous membranes moist.  RESPIRATORY:  Clear to auscultation without rales, wheezes or rhonchi. CARDIOVASCULAR:  Regular rate and rhythm without murmur, rub or gallop. BREAST:  Large breasts.  Right breast with 3 cm area of scarring at the 10 o'clock position (chronic per patient).  No masses, skin changes or nipple discharge.  Left breast with fibrocystic changes upper quadrants.  No masses, skin changes or nipple discharge.  ABDOMEN:  Soft, non-tender, with active bowel sounds, and no appreciable hepatosplenomegaly.  No masses. SKIN:  Freckles.  No rashes, ulcers or lesions. EXTREMITIES: No edema, no  skin discoloration or tenderness.  No palpable cords. LYMPH NODES: No palpable cervical, supraclavicular, axillary or inguinal adenopathy  NEUROLOGICAL: Unremarkable. PSYCH:  Appropriate.  Appointment on 07/19/2016  Component Date Value Ref Range Status  . WBC 07/19/2016 6.2  3.6 - 11.0 K/uL Final  . RBC 07/19/2016 4.46  3.80 - 5.20 MIL/uL Final  . Hemoglobin 07/19/2016 13.3  12.0 - 16.0 g/dL Final  . HCT 07/19/2016 39.7  35.0 - 47.0 % Final  . MCV 07/19/2016 89.1  80.0 - 100.0 fL Final  . MCH 07/19/2016 29.8  26.0 - 34.0 pg Final  . MCHC 07/19/2016 33.5  32.0 - 36.0 g/dL Final  . RDW 07/19/2016 14.5  11.5 - 14.5 % Final  . Platelets 07/19/2016 211  150 - 440 K/uL Final  . Neutrophils Relative % 07/19/2016 50  % Final  . Neutro Abs 07/19/2016 3.1  1.4 - 6.5 K/uL Final  . Lymphocytes Relative 07/19/2016 33  % Final  . Lymphs Abs 07/19/2016 2.1  1.0 - 3.6 K/uL Final  . Monocytes Relative 07/19/2016 12  % Final  . Monocytes Absolute 07/19/2016 0.8  0.2 - 0.9 K/uL Final  . Eosinophils Relative 07/19/2016 3  % Final  . Eosinophils Absolute 07/19/2016 0.2  0 - 0.7 K/uL Final  . Basophils Relative 07/19/2016 1  % Final  . Basophils Absolute 07/19/2016 0.1  0 - 0.1 K/uL Final  . Sodium 07/19/2016 136  135 - 145 mmol/L Final  . Potassium 07/19/2016 4.0  3.5 - 5.1 mmol/L Final  . Chloride 07/19/2016 103  101 - 111 mmol/L Final  . CO2 07/19/2016 24  22 - 32 mmol/L Final  . Glucose, Bld 07/19/2016 109* 65 - 99 mg/dL Final  . BUN 07/19/2016 13  6 - 20 mg/dL Final  . Creatinine, Ser 07/19/2016 0.58  0.44 - 1.00 mg/dL Final  . Calcium 07/19/2016 9.4  8.9 - 10.3 mg/dL Final  . Total Protein 07/19/2016 7.5  6.5 - 8.1 g/dL Final  . Albumin 07/19/2016 4.1  3.5 - 5.0 g/dL Final  . AST 07/19/2016 19  15 - 41 U/L Final  . ALT 07/19/2016 22  14 - 54 U/L Final  . Alkaline Phosphatase 07/19/2016 90  38 - 126 U/L Final  . Total Bilirubin 07/19/2016 1.0  0.3 - 1.2 mg/dL Final  . GFR calc non Af Amer  07/19/2016 >60  >60 mL/min Final  . GFR calc Af Amer 07/19/2016 >60  >60 mL/min Final   Comment: (NOTE) The eGFR has been calculated using the CKD EPI equation. This calculation has not been validated in all clinical situations. eGFR's persistently <60 mL/min signify possible Chronic Kidney Disease.   . Anion gap 07/19/2016 9  5 - 15 Final  . LDH 07/19/2016 144  98 - 192 U/L Final    Assessment:  Mary Todd is a 70 y.o. female with stage II Hodgkin's disease (2004) and stage IA right breast cancer (2011).  She was diagnosed with stage IIA Hodgkin's disease after presenting with low counts and joint pain.  Abdomen and pelvic CT scan as well as PET scan revealed a pelvic mass and retroperitoneal lymphadenopathy.  Biopsy confirmed Hodgkin's disease.    She received ABVD times 5 through cycle 3-A and then AVD without the Bleomycin times 7 subsequent cycles.  Treatment completed 12/2003.    CT and PET scans were negative in 05/2006 and in 06/2006.  She underwent right breast lumpectomy and sentinel lymph node biopsy on 07/21/2010.  Pathology revealed a 0.5 cm grade II invasive ductal carcinoma.  Two sentinel lymph nodes were negative.  Tumor was ER + (>90%), PR + (40%), and Her2/neu 1+.  Pathologic stage was T1aN0Mx.  She received Mammosite radiation.  Oncotype DX testing revealed a low risk lesion.  BRCA1/2 testing was negative.  She received 5 years of an aromatase inhibitor.  Breast cancer index (BCI) testing revealed a 6.3% (CI: 2.9%-9.6%) risk of late recurrent disease (years 5-10) after 5 years of hormonal therapy and a high likelihood of benefit from continued hormonal therapy (30% reduction).  Decision was made to continue Arimidex for 5 more years.  Bone density study on 09/05/2011 revealed osteopenia with a  T-score of -1.7 in L1-L4.  Bone density study on 11/14/2012 revealed osteopenia with a  T-score of -1.5 in L1-L4.  Bilateral mammogram on 06/20/2016 revealed no  evidence of recurrent disease (ordered by Dr. Jamal Collin).  Symptomatically, she is doing well.  She has a ruptured disk.  She has thyroid nodules being biopsied.  She has arthritis in her knee.  She denies any B symptoms.  She denies any breast concerns.    Plan: 1.  Review entire medical history, diagnosis and management of Hodgkin's disease and breast cancer. 2.  Labs today:  CBC with differential, CMP, LDH, CA 27.29. 3.  Bone density study. 4.  RTC in 6 months for MD assess, labs (CBC with diff, CMP, CA27.29), review bone dennisty   Lequita Asal, MD  07/19/2016, 12:12 PM

## 2016-07-20 LAB — CANCER ANTIGEN 27.29: CA 27.29: 8.4 U/mL (ref 0.0–38.6)

## 2016-08-07 DIAGNOSIS — M5023 Other cervical disc displacement, cervicothoracic region: Secondary | ICD-10-CM | POA: Diagnosis not present

## 2016-08-07 DIAGNOSIS — M542 Cervicalgia: Secondary | ICD-10-CM | POA: Diagnosis not present

## 2016-08-29 ENCOUNTER — Other Ambulatory Visit: Payer: Self-pay | Admitting: Oncology

## 2016-08-29 DIAGNOSIS — Z853 Personal history of malignant neoplasm of breast: Secondary | ICD-10-CM

## 2016-09-06 DIAGNOSIS — M542 Cervicalgia: Secondary | ICD-10-CM | POA: Diagnosis not present

## 2016-09-06 DIAGNOSIS — M5023 Other cervical disc displacement, cervicothoracic region: Secondary | ICD-10-CM | POA: Diagnosis not present

## 2016-09-12 DIAGNOSIS — E042 Nontoxic multinodular goiter: Secondary | ICD-10-CM | POA: Diagnosis not present

## 2016-09-12 DIAGNOSIS — E041 Nontoxic single thyroid nodule: Secondary | ICD-10-CM | POA: Diagnosis not present

## 2016-09-26 DIAGNOSIS — M1712 Unilateral primary osteoarthritis, left knee: Secondary | ICD-10-CM | POA: Diagnosis not present

## 2016-10-31 ENCOUNTER — Encounter: Payer: Self-pay | Admitting: Family Medicine

## 2016-10-31 ENCOUNTER — Ambulatory Visit (INDEPENDENT_AMBULATORY_CARE_PROVIDER_SITE_OTHER): Payer: Medicare Other | Admitting: Family Medicine

## 2016-10-31 VITALS — BP 127/70 | HR 87 | Temp 97.9°F | Resp 16 | Ht 65.0 in | Wt 223.9 lb

## 2016-10-31 DIAGNOSIS — L304 Erythema intertrigo: Secondary | ICD-10-CM | POA: Diagnosis not present

## 2016-10-31 DIAGNOSIS — R7303 Prediabetes: Secondary | ICD-10-CM

## 2016-10-31 DIAGNOSIS — I10 Essential (primary) hypertension: Secondary | ICD-10-CM

## 2016-10-31 DIAGNOSIS — M170 Bilateral primary osteoarthritis of knee: Secondary | ICD-10-CM | POA: Diagnosis not present

## 2016-10-31 DIAGNOSIS — M5412 Radiculopathy, cervical region: Secondary | ICD-10-CM

## 2016-10-31 DIAGNOSIS — K219 Gastro-esophageal reflux disease without esophagitis: Secondary | ICD-10-CM

## 2016-10-31 LAB — POCT GLYCOSYLATED HEMOGLOBIN (HGB A1C): HEMOGLOBIN A1C: 5.9

## 2016-10-31 LAB — GLUCOSE, POCT (MANUAL RESULT ENTRY): POC GLUCOSE: 91 mg/dL (ref 70–99)

## 2016-10-31 MED ORDER — RANITIDINE HCL 150 MG PO TABS: 150.0000 mg | ORAL_TABLET | Freq: Two times a day (BID) | ORAL | 0 refills | Status: DC

## 2016-10-31 MED ORDER — METOPROLOL SUCCINATE ER 50 MG PO TB24: 50.0000 mg | ORAL_TABLET | Freq: Every day | ORAL | 1 refills | Status: DC

## 2016-10-31 MED ORDER — ACETAMINOPHEN 500 MG PO TABS: 500.0000 mg | ORAL_TABLET | Freq: Four times a day (QID) | ORAL | 0 refills | Status: DC | PRN

## 2016-10-31 MED ORDER — METFORMIN HCL ER 750 MG PO TB24: 750.0000 mg | ORAL_TABLET | Freq: Every day | ORAL | 1 refills | Status: DC

## 2016-10-31 MED ORDER — FLUCONAZOLE 150 MG PO TABS
150.0000 mg | ORAL_TABLET | ORAL | 0 refills | Status: DC
Start: 2016-10-31 — End: 2017-02-05

## 2016-10-31 NOTE — Progress Notes (Signed)
Name: Mary Todd   MRN: [id number]    DOB: Nov 12, 1945   Date:10/31/2016       Progress Note  Subjective  Chief Complaint  Chief Complaint  Patient presents with  . Follow-up    3 mo    HPI  Cervical Radiculitis: she developed right shoulder pain around January 2017, followed by neck pain and was seen by Dr. Mack Guise - had a shoulder injection and arm pain did not improve, she also went for PT to help with neck pain, and pain got progressively worse. She states that for the past couple of months the pain was constant and getting progressively worse, described as aching and burning feeling that goes from neck down to both shoulders, but worse on the right side and radiates down to her right hand. She has noticed some weakness on right hand and has difficulty opening jars and water bottles because of it. MRI c-spine showed stenosis and herniated disk, she was referred to neurosurgeon and Dr. Saintclair Halsted referred to the pain management side, she had two steroid injections and she is doing well now, no longer has right arm pain or paresthesia  HTN: taking medication as prescribed, no side effects. No chest pain or palpitation. No SOB.   GERD: taking Pantoprazole daily symptoms of heartburn returns when off medication for a few days. No regurgitation. Discussed possible side effects of long term use of PPI, she tried weaning self off PPI, but symptoms of indigestion returned, so she stopped Ranitidine, advised to try weaning off Meloxicam and take Tylenol because it may be the cause of GERD not being controlled  OA: taking Meloxicam daily, explained that it can make GERD worse, she will tried  switching from Meloxicam to Tylenol 500 mg three times daily , and half of Meloxicam. She had synvix and steroid injection and pain on knees has improved.   Breast Cancer: sees Dr. Jamal Collin and currently under the care of Dr. Mike Gip  for her Hodgkins and Breast cancer, still on Arimidex.  Thyroid  nodules: incidental finding on CT neck , had biopsy last week by Dr. Gabriel Carina and biopsy was negative  Obesity: she has  Long history of obesity, she is now on Metformin and stopped eating after 7 pm, lost over 4 lbs since lats visit, continue the hard work  Intertrigo: below breast and groin area: currently no rash but has episodes of itching. Cream did not help, we will try oral medication  Patient Active Problem List   Diagnosis Date Noted  . Morbid obesity ([place]) 07/17/2016  . Spinal stenosis in cervical region 05/22/2016  . Thyroid nodule 05/22/2016  . GERD without esophagitis 03/16/2016  . Neck muscle spasm 03/16/2016  . History of back surgery 03/16/2016  . Osteoarthritis of both knees 03/16/2016  . Large breasts 03/16/2016  . Hypertension, benign 03/16/2016  . Perennial allergic rhinitis with seasonal variation 03/16/2016  . History of pneumococcal pneumonia 09/20/2015  . Hodgkin disease ([place]) 07/15/2014  . Breast cancer, right ([place]) 06/26/2013    Past Surgical History:  Procedure Laterality Date  . ABDOMINAL HYSTERECTOMY    . BACK SURGERY    . BREAST BIOPSY Right 2011   radiation  . BREAST SURGERY Right 2011   right breast lumpectomy,L/SN/R for right breast CA   . COLONOSCOPY  2003  . PORT A CATH REVISION    . WRIST SURGERY  2005    Family History  Problem Relation Age of Onset  . Cancer Father   .  Stroke Brother   . Breast cancer Mother 27    Social History   Social History  . Marital status: Married    Spouse name: N/A  . Number of children: N/A  . Years of education: N/A   Occupational History  . Not on file.   Social History Main Topics  . Smoking status: Never Smoker  . Smokeless tobacco: Former Systems developer  . Alcohol use 0.0 oz/week     Comment: occas  . Drug use: No  . Sexual activity: Not on file   Other Topics Concern  . Not on file   Social History Narrative  . No narrative on file     Current Outpatient Prescriptions:  .  amLODipine  (NORVASC) 5 MG tablet, Take 1 tablet (5 mg total) by mouth 1 day or 1 dose., Disp: 90 tablet, Rfl: 1 .  anastrozole (ARIMIDEX) 1 MG tablet, TAKE ONE TABLET BY MOUTH ONCE DAILY, Disp: 90 tablet, Rfl: 3 .  aspirin 81 MG tablet, Take 81 mg by mouth daily., Disp: , Rfl:  .  CALCIUM-MAGNESIUM PO, Take 1 tablet by mouth daily., Disp: , Rfl:  .  cetirizine (ZYRTEC) 10 MG tablet, Take 10 mg by mouth daily., Disp: , Rfl:  .  Cholecalciferol (VITAMIN D-3 PO), Take 1 tablet by mouth daily., Disp: , Rfl:  .  meloxicam (MOBIC) 15 MG tablet, Take 1 tablet (15 mg total) by mouth daily., Disp: 90 tablet, Rfl: 1 .  metoprolol succinate (TOPROL-XL) 50 MG 24 hr tablet, Take 1 tablet (50 mg total) by mouth daily., Disp: 90 tablet, Rfl: 1 .  Multiple Vitamin (MULTIVITAMIN) tablet, Take 1 tablet by mouth daily., Disp: , Rfl:  .  pantoprazole (PROTONIX) 40 MG tablet, Take 1 tablet (40 mg total) by mouth daily., Disp: 90 tablet, Rfl: 1 .  metFORMIN (GLUCOPHAGE-XR) 750 MG 24 hr tablet, Take 1 tablet (750 mg total) by mouth daily with breakfast., Disp: 90 tablet, Rfl: 1 .  ranitidine (ZANTAC) 150 MG tablet, Take 1 tablet (150 mg total) by mouth 2 (two) times daily. Alternate with pantoprazole, Disp: 180 tablet, Rfl: 0  Allergies  Allergen Reactions  . Augmentin [Amoxicillin-Pot Clavulanate] Nausea Only     ROS  Constitutional: Negative for fever or significant  weight change.  Respiratory: Negative for cough and shortness of breath.   Cardiovascular: Negative for chest pain or palpitations.  Gastrointestinal: Negative for abdominal pain, no bowel changes.  Musculoskeletal: Negative for gait problem or joint swelling.  Skin: Negative for rash.  Neurological: Negative for dizziness or headache.  No other specific complaints in a complete review of systems (except as listed in HPI above).  Objective  Vitals:   10/31/16 1104  BP: 127/70  Pulse: 87  Resp: 16  Temp: 97.9 F (36.6 C)  TempSrc: Oral  SpO2: 96%   Weight: 223 lb 14.4 oz (101.6 kg)  Height: 5\' 5"  (1.651 m)    Body mass index is 37.26 kg/m.  Physical Exam  Constitutional: Patient appears well-developed and well-nourished. Obese No distress.  HEENT: head atraumatic, normocephalic, pupils equal and reactive to light,  neck supple, throat within normal limits Cardiovascular: Normal rate, regular rhythm and normal heart sounds.  No murmur heard. No BLE edema. Pulmonary/Chest: Effort normal and breath sounds normal. No respiratory distress. Abdominal: Soft.  There is no tenderness. Muscular Skeletal: crepitus with extension of both knees Psychiatric: Patient has a normal mood and affect. behavior is normal. Judgment and thought content normal.   PHQ2/9: Depression  screen [facility] 2/9 10/31/2016 07/17/2016 03/16/2016 12/17/2015 09/20/2015  Decreased Interest 0 0 0 0 0  Down, Depressed, Hopeless 0 0 0 0 0  PHQ - 2 Score 0 0 0 0 0     Fall Risk: Fall Risk  10/31/2016 07/17/2016 03/16/2016 12/17/2015 10/27/2015  Falls in the past year? No No Yes No No  Number falls in past yr: - - 2 or more - -  Injury with Fall? - - No - -     Functional Status Survey: Is the patient deaf or have difficulty hearing?: No Does the patient have difficulty seeing, even when wearing glasses/contacts?: Yes (glasses) Does the patient have difficulty concentrating, remembering, or making decisions?: No Does the patient have difficulty walking or climbing stairs?: No Does the patient have difficulty dressing or bathing?: No Does the patient have difficulty doing errands alone such as visiting a doctor's office or shopping?: No    Assessment & Plan  1. Cervical radiculopathy  Doing well   2. Hypertension, benign  - metoprolol succinate (TOPROL-XL) 50 MG 24 hr tablet; Take 1 tablet (50 mg total) by mouth daily.  Dispense: 90 tablet; Refill: 1  3. GERD without esophagitis  - ranitidine (ZANTAC) 150 MG tablet; Take 1 tablet (150 mg total) by mouth 2 (two)  times daily. Alternate with pantoprazole  Dispense: 180 tablet; Refill: 0  4. Pre-diabetes  - metFORMIN (GLUCOPHAGE-XR) 750 MG 24 hr tablet; Take 1 tablet (750 mg total) by mouth daily with breakfast.  Dispense: 90 tablet; Refill: 1  5. Morbid obesity, unspecified obesity type ([place])  - metFORMIN (GLUCOPHAGE-XR) 750 MG 24 hr tablet; Take 1 tablet (750 mg total) by mouth daily with breakfast.  Dispense: 90 tablet; Refill: 1  6. Primary osteoarthritis of both knees  Try weaning off Meloxicam - acetaminophen (TYLENOL) 500 MG tablet; Take 1 tablet (500 mg total) by mouth every 6 (six) hours as needed.  Dispense: 90 tablet; Refill: 0  7. Intertrigo  - fluconazole (DIFLUCAN) 150 MG tablet; Take 1 tablet (150 mg total) by mouth every other day.  Dispense: 3 tablet; Refill: 0

## 2016-10-31 NOTE — Addendum Note (Signed)
Addended by: Barnie Alderman L on: 10/31/2016 12:02 PM   Modules accepted: Orders

## 2016-11-09 ENCOUNTER — Telehealth: Payer: Self-pay | Admitting: Family Medicine

## 2016-11-09 ENCOUNTER — Other Ambulatory Visit: Payer: Self-pay | Admitting: Family Medicine

## 2016-11-09 DIAGNOSIS — L304 Erythema intertrigo: Secondary | ICD-10-CM

## 2016-11-09 NOTE — Telephone Encounter (Signed)
Patient would like to see a Dermatologist due to ongoing issues and would like to go to the one in [place].

## 2016-11-09 NOTE — Telephone Encounter (Signed)
We can refer her to Dermatologist. There was no rash, not sure what is causing her to itch, may need a biopsy

## 2016-11-09 NOTE — Telephone Encounter (Signed)
Was given prescription fluconazole have taken all of it however it did not help. It did not relieve the itch and irritation. Asking that you please call in something different to [facility].

## 2016-11-10 ENCOUNTER — Other Ambulatory Visit: Payer: Self-pay | Admitting: Family Medicine

## 2016-11-10 DIAGNOSIS — R21 Rash and other nonspecific skin eruption: Secondary | ICD-10-CM

## 2016-11-10 DIAGNOSIS — L298 Other pruritus: Secondary | ICD-10-CM | POA: Diagnosis not present

## 2016-12-12 ENCOUNTER — Ambulatory Visit: Payer: Medicare Other

## 2016-12-14 ENCOUNTER — Ambulatory Visit
Admission: RE | Admit: 2016-12-14 | Discharge: 2016-12-14 | Disposition: A | Discharge status: Home / Self Care | Payer: Medicare Other | Source: Ambulatory Visit | Attending: Hematology and Oncology | Admitting: Hematology and Oncology

## 2016-12-14 DIAGNOSIS — Z780 Asymptomatic menopausal state: Secondary | ICD-10-CM | POA: Insufficient documentation

## 2016-12-14 DIAGNOSIS — H31002 Unspecified chorioretinal scars, left eye: Secondary | ICD-10-CM | POA: Diagnosis not present

## 2016-12-14 DIAGNOSIS — M810 Age-related osteoporosis without current pathological fracture: Secondary | ICD-10-CM | POA: Diagnosis not present

## 2016-12-14 DIAGNOSIS — H2513 Age-related nuclear cataract, bilateral: Secondary | ICD-10-CM | POA: Diagnosis not present

## 2016-12-14 DIAGNOSIS — C50911 Malignant neoplasm of unspecified site of right female breast: Secondary | ICD-10-CM | POA: Diagnosis not present

## 2016-12-15 ENCOUNTER — Telehealth: Payer: Self-pay | Admitting: *Deleted

## 2016-12-21 ENCOUNTER — Telehealth: Payer: Self-pay | Admitting: *Deleted

## 2016-12-21 NOTE — Telephone Encounter (Signed)
Called patient to inform her that bone density showed osteopenia.  She will need treatment to strengthen her bones.  Will need dental clearance prior to treatment.  Patient states she saw Dr Rosalita Levan in [place] about 3 months ago. Informed her we would send appropriate paperwork to her dentist and once we have clearance to treat we will be back in touch with her to get her set up for treatments.  Verbalized understanding.

## 2016-12-21 NOTE — Telephone Encounter (Addendum)
-----   Message from Donnita Falls, RN sent at 12/21/2016 12:04 PM EST ----- Regarding: FW: Please call patient   ----- Message ----- From: Lequita Asal, MD Sent: 12/14/2016   5:06 PM To: Donnita Falls, RN, Shirlean Kelly, RN Subject: Please call patient                             Bone density study reveals osteoporosis.  Patient needs treatment.  M  ----- Message ----- From: Interface, Rad Results In Sent: 12/14/2016   3:23 PM To: Lequita Asal, MD

## 2016-12-22 ENCOUNTER — Telehealth: Payer: Self-pay | Admitting: Family Medicine

## 2016-12-22 DIAGNOSIS — L298 Other pruritus: Secondary | ICD-10-CM | POA: Diagnosis not present

## 2016-12-22 NOTE — Telephone Encounter (Signed)
Pt is going to [place] on 01-04-17. Was told that it is recommended that she start taking Malaria pills. Uses [facility]. Please return call to discuss [phone]

## 2016-12-22 NOTE — Telephone Encounter (Signed)
She needs to go to [place] clinic. [phone]. She may need immunization also and the medication for malaria is different for different locations. They are up to date with CDC guidelines. She needs to go on Monday Thank you

## 2016-12-25 NOTE — Telephone Encounter (Signed)
Pt verbally informed and was given the phone number. She also states that she had went to [facility] and gotten her shots but they did not give her the malaria pill. I told her to contact them to see if they could prescribe it for her. Patient verbalized understanding.

## 2016-12-25 NOTE — Telephone Encounter (Signed)
Patient has not seen [facility] in >6 months, Office to call patient and make her an appt and they will send me dental clearance at that time

## 2016-12-25 NOTE — Telephone Encounter (Signed)
No answer left message for patient to call clinic

## 2016-12-27 DIAGNOSIS — M170 Bilateral primary osteoarthritis of knee: Secondary | ICD-10-CM | POA: Diagnosis not present

## 2016-12-29 ENCOUNTER — Telehealth: Payer: Self-pay | Admitting: *Deleted

## 2016-12-29 NOTE — Telephone Encounter (Signed)
Received eamil from [facility] that the patient was noted without decay, no teeth present with poor px, oran cancer screening unremarkable.

## 2017-01-24 ENCOUNTER — Encounter: Payer: Self-pay | Admitting: Hematology and Oncology

## 2017-01-24 ENCOUNTER — Inpatient Hospital Stay: Payer: Medicare Other | Attending: Hematology and Oncology

## 2017-01-24 ENCOUNTER — Inpatient Hospital Stay (HOSPITAL_BASED_OUTPATIENT_CLINIC_OR_DEPARTMENT_OTHER): Payer: Medicare Other | Admitting: Hematology and Oncology

## 2017-01-24 ENCOUNTER — Other Ambulatory Visit: Payer: Self-pay | Admitting: Hematology and Oncology

## 2017-01-24 ENCOUNTER — Inpatient Hospital Stay: Payer: Medicare Other

## 2017-01-24 VITALS — BP 122/81 | HR 93 | Temp 97.0°F | Resp 18 | Wt 224.2 lb

## 2017-01-24 DIAGNOSIS — C8193 Hodgkin lymphoma, unspecified, intra-abdominal lymph nodes: Secondary | ICD-10-CM

## 2017-01-24 DIAGNOSIS — Z170 Estrogen receptor positive status [ER+]: Secondary | ICD-10-CM | POA: Diagnosis not present

## 2017-01-24 DIAGNOSIS — Z7982 Long term (current) use of aspirin: Secondary | ICD-10-CM | POA: Diagnosis not present

## 2017-01-24 DIAGNOSIS — M810 Age-related osteoporosis without current pathological fracture: Secondary | ICD-10-CM | POA: Diagnosis not present

## 2017-01-24 DIAGNOSIS — K219 Gastro-esophageal reflux disease without esophagitis: Secondary | ICD-10-CM | POA: Insufficient documentation

## 2017-01-24 DIAGNOSIS — Z803 Family history of malignant neoplasm of breast: Secondary | ICD-10-CM | POA: Diagnosis not present

## 2017-01-24 DIAGNOSIS — E079 Disorder of thyroid, unspecified: Secondary | ICD-10-CM | POA: Insufficient documentation

## 2017-01-24 DIAGNOSIS — Z923 Personal history of irradiation: Secondary | ICD-10-CM

## 2017-01-24 DIAGNOSIS — Z79899 Other long term (current) drug therapy: Secondary | ICD-10-CM

## 2017-01-24 DIAGNOSIS — Z880 Allergy status to penicillin: Secondary | ICD-10-CM | POA: Insufficient documentation

## 2017-01-24 DIAGNOSIS — Z79811 Long term (current) use of aromatase inhibitors: Secondary | ICD-10-CM | POA: Diagnosis not present

## 2017-01-24 DIAGNOSIS — Z9221 Personal history of antineoplastic chemotherapy: Secondary | ICD-10-CM | POA: Diagnosis not present

## 2017-01-24 DIAGNOSIS — C50911 Malignant neoplasm of unspecified site of right female breast: Secondary | ICD-10-CM

## 2017-01-24 DIAGNOSIS — I10 Essential (primary) hypertension: Secondary | ICD-10-CM | POA: Insufficient documentation

## 2017-01-24 DIAGNOSIS — Z7984 Long term (current) use of oral hypoglycemic drugs: Secondary | ICD-10-CM | POA: Insufficient documentation

## 2017-01-24 DIAGNOSIS — Z8571 Personal history of Hodgkin lymphoma: Secondary | ICD-10-CM | POA: Diagnosis not present

## 2017-01-24 DIAGNOSIS — Z809 Family history of malignant neoplasm, unspecified: Secondary | ICD-10-CM

## 2017-01-24 DIAGNOSIS — Z780 Asymptomatic menopausal state: Secondary | ICD-10-CM

## 2017-01-24 DIAGNOSIS — M1990 Unspecified osteoarthritis, unspecified site: Secondary | ICD-10-CM | POA: Diagnosis not present

## 2017-01-24 LAB — COMPREHENSIVE METABOLIC PANEL
ALT: 24 U/L (ref 14–54)
AST: 20 U/L (ref 15–41)
Albumin: 4 g/dL (ref 3.5–5.0)
Alkaline Phosphatase: 92 U/L (ref 38–126)
Anion gap: 6 (ref 5–15)
BUN: 16 mg/dL (ref 6–20)
CO2: 25 mmol/L (ref 22–32)
Calcium: 9.4 mg/dL (ref 8.9–10.3)
Chloride: 104 mmol/L (ref 101–111)
Creatinine, Ser: 0.8 mg/dL (ref 0.44–1.00)
GFR calc Af Amer: >60 mL/min (ref >60)
GFR calc non Af Amer: >60 mL/min (ref >60)
Glucose, Bld: 111 mg/dL — ABNORMAL HIGH (ref 65–99)
Potassium: 3.5 mmol/L (ref 3.5–5.1)
Sodium: 135 mmol/L (ref 135–145)
Total Bilirubin: 0.9 mg/dL (ref 0.3–1.2)
Total Protein: 7.4 g/dL (ref 6.5–8.1)

## 2017-01-24 LAB — CBC WITH DIFFERENTIAL/PLATELET
Basophils Absolute: 0.1 10*3/uL (ref 0–0.1)
Basophils Relative: 1 %
Eosinophils Absolute: 0.2 10*3/uL (ref 0–0.7)
Eosinophils Relative: 3 %
HCT: 40.9 % (ref 35.0–47.0)
Hemoglobin: 13.7 g/dL (ref 12.0–16.0)
Lymphocytes Relative: 24 %
Lymphs Abs: 2.1 10*3/uL (ref 1.0–3.6)
MCH: 30.1 pg (ref 26.0–34.0)
MCHC: 33.5 g/dL (ref 32.0–36.0)
MCV: 89.9 fL (ref 80.0–100.0)
Monocytes Absolute: 0.7 10*3/uL (ref 0.2–0.9)
Monocytes Relative: 8 %
Neutro Abs: 5.7 10*3/uL (ref 1.4–6.5)
Neutrophils Relative %: 64 %
Platelets: 283 10*3/uL (ref 150–440)
RBC: 4.55 MIL/uL (ref 3.80–5.20)
RDW: 14 % (ref 11.5–14.5)
WBC: 8.8 10*3/uL (ref 3.6–11.0)

## 2017-01-24 MED ORDER — DENOSUMAB 60 MG/ML ~~LOC~~ SOLN
60.0000 mg | Freq: Once | SUBCUTANEOUS | Status: AC
  Administered 2017-01-24: 60 mg via SUBCUTANEOUS

## 2017-01-24 NOTE — Progress Notes (Signed)
Patient states she has had a cold with congestion which has caused discomfort in her chest.  Otherwise, offers no complaints.

## 2017-01-24 NOTE — Progress Notes (Signed)
[facility] day:  01/24/2017  Chief Complaint: Mary Todd is a 71 y.o. female with stage II Hodgkin's disease (2004) and stage IA right breast cancer (2011) who is seen for 6 month assessment.  HPI: The patient was last seen in the medical oncology clinic on 07/19/2016.  At that time, she was seen for initial assessment.  She was doing well except for a ruptured disk and arthritis.  She had thyroid nodules being biopsied.  She denied any B symptoms.  She denied any breast concerns.    Bone density study on 12/14/2016 revealed osteoporosis with a T-score of -3.1 in the AP spine L2-L3.  She was contacted regarding the need for treatment.  Dental clearance was obtained from [facility] dentistry.  She previously took Fosamax.  During the interim, thyroid nodules were found to be benign (Dr. Gabriel Carina at [facility] on 09/18/2016).  Follow-up ultrasound is planned in 6 months.  She had injections in her knees for arthritis and in her neck for ruptured disk, no current complaints.    She returned from a 14-day trip to [place] last week where she walked a lot.  She reports night sweats x 3 months.     Past Medical History:  Diagnosis Date  . Breast cancer ([place]) right   2011, T1A N0, radiation  . Esophageal reflux   . Hodgkin's disease ([place]) 2004  . Hypertension 2006  . Personal history of malignant neoplasm of breast 2011   right breast    Past Surgical History:  Procedure Laterality Date  . ABDOMINAL HYSTERECTOMY    . BACK SURGERY    . BREAST BIOPSY Right 2011   radiation  . BREAST SURGERY Right 2011   right breast lumpectomy,L/SN/R for right breast CA   . COLONOSCOPY  2003  . PORT A CATH REVISION    . WRIST SURGERY  2005    Family History  Problem Relation Age of Onset  . Cancer Father   . Stroke Brother   . Breast cancer Mother 59    Social History:  reports that she has never smoked. She has quit using smokeless tobacco. She  reports that she drinks alcohol. She reports that she does not use drugs.  She is retired.  She previously worked for [employer].  She went on a 14-day trip to [place] in March 2018 with a group of 20 acquaintances.  She is alone today.  Allergies:  Allergies  Allergen Reactions  . Augmentin [Amoxicillin-Pot Clavulanate] Nausea Only    Current Medications: Current Outpatient Prescriptions  Medication Sig Dispense Refill  . acetaminophen (TYLENOL) 500 MG tablet Take 1 tablet (500 mg total) by mouth every 6 (six) hours as needed. 90 tablet 0  . amLODipine (NORVASC) 5 MG tablet Take 1 tablet (5 mg total) by mouth 1 day or 1 dose. 90 tablet 1  . anastrozole (ARIMIDEX) 1 MG tablet TAKE ONE TABLET BY MOUTH ONCE DAILY 90 tablet 3  . aspirin 81 MG tablet Take 81 mg by mouth daily.    Marland Kitchen CALCIUM-MAGNESIUM PO Take 1 tablet by mouth daily.    . cetirizine (ZYRTEC) 10 MG tablet Take 10 mg by mouth daily.    . Cholecalciferol (VITAMIN D-3 PO) Take 1 tablet by mouth daily.    . meloxicam (MOBIC) 15 MG tablet Take 1 tablet (15 mg total) by mouth daily. 90 tablet 1  . metFORMIN (GLUCOPHAGE-XR) 750 MG 24 hr tablet Take 1  tablet (750 mg total) by mouth daily with breakfast. 90 tablet 1  . metoprolol succinate (TOPROL-XL) 50 MG 24 hr tablet Take 1 tablet (50 mg total) by mouth daily. 90 tablet 1  . Multiple Vitamin (MULTIVITAMIN) tablet Take 1 tablet by mouth daily.    . pantoprazole (PROTONIX) 40 MG tablet Take 1 tablet (40 mg total) by mouth daily. 90 tablet 1  . ranitidine (ZANTAC) 150 MG tablet Take 1 tablet (150 mg total) by mouth 2 (two) times daily. Alternate with pantoprazole 180 tablet 0  . fluconazole (DIFLUCAN) 150 MG tablet Take 1 tablet (150 mg total) by mouth every other day. (Patient not taking: Reported on 01/24/2017) 3 tablet 0   No current facility-administered medications for this visit.     Review of Systems:  GENERAL:  Feels well.  Active.  No fevers, sweats or weight  loss. PERFORMANCE STATUS (ECOG):  1 HEENT:  No visual changes, runny nose, sore throat, mouth sores or tenderness. Lungs: No shortness of breath or cough.  No hemoptysis. Cardiac:  No chest pain, palpitations, orthopnea, or PND. GI:  No nausea, vomiting, diarrhea, constipation, melena or hematochezia.  Colonoscopy on 06/26/2012. GU:  No urgency, frequency, dysuria, or hematuria. Musculoskeletal:  Ruptured disk x 1 injection.  Arthritis in knee x 3 injections.  No muscle tenderness. Extremities:  No pain or swelling. Skin:  No rashes or skin changes. Neuro:  No headache, numbness or weakness, balance or coordination issues. Endocrine:  Nodules on thyroid (benign).  Night sweats.  No diabetes or hot flashes.  Psych:  No mood changes, depression or anxiety. Pain:  No focal pain. Review of systems:  All other systems reviewed and found to be negative.  Physical Exam: Blood pressure 122/81, pulse 93, temperature 97 F (36.1 C), temperature source Tympanic, resp. rate 18, weight 224 lb 3.3 oz (101.7 kg). GENERAL:  Well developed, well nourished, woman sitting comfortably in the exam room in no acute distress. MENTAL STATUS:  Alert and oriented to person, place and time. HEAD:  Short light Zaela Graley hair.  Normocephalic, atraumatic, face symmetric, no Cushingoid features. EYES:  Glasses.  Kahari Critzer eyes.  Pupils equal round and reactive to light and accomodation.  No conjunctivitis or scleral icterus. ENT:  Oropharynx clear without lesion.  Tongue normal. Mucous membranes moist.  RESPIRATORY:  Clear to auscultation without rales, wheezes or rhonchi. CARDIOVASCULAR:  Regular rate and rhythm without murmur, rub or gallop. BREAST:  Large breasts.  Right breast with 3 cm area of scarring at the 10 o'clock position (chronic).  No masses, skin changes or nipple discharge.  Left breast with fibrocystic changes upper quadrants.  No masses, skin changes or nipple discharge.  ABDOMEN:  Soft, non-tender, with  active bowel sounds, and no appreciable hepatosplenomegaly.  No masses. SKIN:  Freckles.  No rashes, ulcers or lesions. EXTREMITIES: No edema, no skin discoloration or tenderness.  No palpable cords. LYMPH NODES: No palpable cervical, supraclavicular, axillary or inguinal adenopathy  NEUROLOGICAL: Unremarkable. PSYCH:  Appropriate.   Appointment on 01/24/2017  Component Date Value Ref Range Status  . WBC 01/24/2017 8.8  3.6 - 11.0 K/uL Final  . RBC 01/24/2017 4.55  3.80 - 5.20 MIL/uL Final  . Hemoglobin 01/24/2017 13.7  12.0 - 16.0 g/dL Final  . HCT 01/24/2017 40.9  35.0 - 47.0 % Final  . MCV 01/24/2017 89.9  80.0 - 100.0 fL Final  . MCH 01/24/2017 30.1  26.0 - 34.0 pg Final  . MCHC 01/24/2017 33.5  32.0 -  36.0 g/dL Final  . RDW 01/24/2017 14.0  11.5 - 14.5 % Final  . Platelets 01/24/2017 283  150 - 440 K/uL Final  . Neutrophils Relative % 01/24/2017 64  % Final  . Neutro Abs 01/24/2017 5.7  1.4 - 6.5 K/uL Final  . Lymphocytes Relative 01/24/2017 24  % Final  . Lymphs Abs 01/24/2017 2.1  1.0 - 3.6 K/uL Final  . Monocytes Relative 01/24/2017 8  % Final  . Monocytes Absolute 01/24/2017 0.7  0.2 - 0.9 K/uL Final  . Eosinophils Relative 01/24/2017 3  % Final  . Eosinophils Absolute 01/24/2017 0.2  0 - 0.7 K/uL Final  . Basophils Relative 01/24/2017 1  % Final  . Basophils Absolute 01/24/2017 0.1  0 - 0.1 K/uL Final  . Sodium 01/24/2017 135  135 - 145 mmol/L Final  . Potassium 01/24/2017 3.5  3.5 - 5.1 mmol/L Final  . Chloride 01/24/2017 104  101 - 111 mmol/L Final  . CO2 01/24/2017 25  22 - 32 mmol/L Final  . Glucose, Bld 01/24/2017 111* 65 - 99 mg/dL Final  . BUN 01/24/2017 16  6 - 20 mg/dL Final  . Creatinine, Ser 01/24/2017 0.80  0.44 - 1.00 mg/dL Final  . Calcium 01/24/2017 9.4  8.9 - 10.3 mg/dL Final  . Total Protein 01/24/2017 7.4  6.5 - 8.1 g/dL Final  . Albumin 01/24/2017 4.0  3.5 - 5.0 g/dL Final  . AST 01/24/2017 20  15 - 41 U/L Final  . ALT 01/24/2017 24  14 - 54 U/L  Final  . Alkaline Phosphatase 01/24/2017 92  38 - 126 U/L Final  . Total Bilirubin 01/24/2017 0.9  0.3 - 1.2 mg/dL Final  . GFR calc non Af Amer 01/24/2017 >60  >60 mL/min Final  . GFR calc Af Amer 01/24/2017 >60  >60 mL/min Final   Comment: (NOTE) The eGFR has been calculated using the CKD EPI equation. This calculation has not been validated in all clinical situations. eGFR's persistently <60 mL/min signify possible Chronic Kidney Disease.   . Anion gap 01/24/2017 6  5 - 15 Final    Assessment:  Tiffeny Minchew is a 71 y.o. female with stage II Hodgkin's disease (2004) and stage IA right breast cancer (2011).  She was diagnosed with stage IIA Hodgkin's disease after presenting with low counts and joint pain.  Abdomen and pelvic CT scan as well as PET scan revealed a pelvic mass and retroperitoneal lymphadenopathy.  Biopsy confirmed Hodgkin's disease.    She received ABVD times 5 through cycle 3-A and then AVD without the Bleomycin times 7 subsequent cycles.  Treatment completed 12/2003.    CT and PET scans were negative in 05/2006 and in 06/2006.  She underwent right breast lumpectomy and sentinel lymph node biopsy on 07/21/2010.  Pathology revealed a 0.5 cm grade II invasive ductal carcinoma.  Two sentinel lymph nodes were negative.  Tumor was ER + (>90%), PR + (40%), and Her2/neu 1+.  Pathologic stage was T1aN0Mx.  She received Mammosite radiation.  Oncotype DX testing revealed a low risk lesion.  BRCA1/2 testing was negative.  She received 5 years of an aromatase inhibitor.  Breast cancer index (BCI) testing revealed a 6.3% (CI: 2.9%-9.6%) risk of late recurrent disease (years 5-10) after 5 years of hormonal therapy and a high likelihood of benefit from continued hormonal therapy (30% reduction).  Decision was made to continue Arimidex for 5 more years.  Bilateral mammogram on 06/20/2016 revealed no evidence of recurrent disease (ordered  by Dr. Jamal Collin).  CA27.29 has been  followed:  8.4 on 07/19/2016 and 13.4 on 01/24/2017.  Bone density study on 09/05/2011 revealed osteopenia with a  T-score of -1.7 in L1-L4.  Bone density study on 11/14/2012 revealed osteopenia with a  T-score of -1.5 in L1-L4.  Bone density study on 12/14/2016 revealed osteoporosis in L2-3 with a T-score of -3.1.  Left femur normal with T-score of -0.8.  Symptomatically, she is doing well.  She denies any B symptoms.  She denies any breast concerns.    Plan: 1.  Labs today:  CBC with differential, CMP, CA 27.29. 2.  Discuss bone density study.  Study reveals osteoporosis.  Discuss Prolia every 6 months.  Side effects reviewed. 3.  Prolia today. 4.  Continue Arimidex 1 mg po daily. 5.  Continue calcium 1200 mg and vitamin D 800 IU daily.    6.  Follow-up mammogram in 05/2017 (ordered by Dr. Jamal Collin). 7.  Follow-up thyroid ultrasound in 03/2017. 8.  RTC in 6 months for MD assess, labs (CBC with diff, CMP, CA27.29), mammogram review, and Prolia.   Lucendia Herrlich, NP 01/24/17 3:21 PM   I saw and evaluated the patient, participating in the key portions of the service and reviewing pertinent diagnostic studies and records.  I reviewed the nurse practitioner's note and agree with the findings and the plan.  The assessment and plan were discussed with the patient.   A few questions were asked by the patient and answered.    Lequita Asal, MD 01/24/2017

## 2017-01-24 NOTE — Patient Instructions (Signed)

## 2017-01-25 LAB — CANCER ANTIGEN 27.29: CA 27.29: 13.4 U/mL (ref 0.0–38.6)

## 2017-02-01 ENCOUNTER — Ambulatory Visit: Payer: Medicare Other | Admitting: Family Medicine

## 2017-02-05 ENCOUNTER — Encounter: Payer: Self-pay | Admitting: Family Medicine

## 2017-02-05 ENCOUNTER — Ambulatory Visit (INDEPENDENT_AMBULATORY_CARE_PROVIDER_SITE_OTHER): Payer: Medicare Other | Admitting: Family Medicine

## 2017-02-05 VITALS — BP 122/78 | HR 96 | Temp 98.0°F | Resp 16 | Wt 223.4 lb

## 2017-02-05 DIAGNOSIS — R05 Cough: Secondary | ICD-10-CM

## 2017-02-05 DIAGNOSIS — R059 Cough, unspecified: Secondary | ICD-10-CM

## 2017-02-05 MED ORDER — BENZONATATE 100 MG PO CAPS: 100.0000 mg | ORAL_CAPSULE | Freq: Three times a day (TID) | ORAL | 0 refills | Status: DC | PRN

## 2017-02-05 MED ORDER — BUDESONIDE-FORMOTEROL FUMARATE 160-4.5 MCG/ACT IN AERO: 2.0000 | INHALATION_SPRAY | Freq: Two times a day (BID) | RESPIRATORY_TRACT | 0 refills | Status: DC

## 2017-02-05 NOTE — Progress Notes (Signed)
Name: Mary Todd   MRN: [id number]    DOB: 06/25/1946   Date:02/05/2017       Progress Note  Subjective  Chief Complaint  Chief Complaint  Patient presents with  . Cough    for 3 weeks   . Fatigue    and slight body aches    Cough  This is a new problem. Episode onset: 3 weeks ago. The problem has been gradually improving. The problem occurs constantly. The cough is non-productive (Worse at night). Associated symptoms include myalgias, nasal congestion, postnasal drip and rhinorrhea. Pertinent negatives include no chills, ear pain, fever, headaches, sore throat, shortness of breath or wheezing. Associated symptoms comments: Denies NVD. Risk factors for lung disease include travel (Symptoms began while traveling in [place]). She has tried rest (Tylenol PM and Tylenol Daytime) for the symptoms. The treatment provided no relief. Her past medical history is significant for bronchitis, environmental allergies and pneumonia. There is no history of asthma or COPD.     Patient Active Problem List   Diagnosis Date Noted  . Osteoporosis 12/14/2016  . Morbid obesity ([place]) 07/17/2016  . Spinal stenosis in cervical region 05/22/2016  . Thyroid nodule 05/22/2016  . GERD without esophagitis 03/16/2016  . Neck muscle spasm 03/16/2016  . History of back surgery 03/16/2016  . Osteoarthritis of both knees 03/16/2016  . Large breasts 03/16/2016  . Hypertension, benign 03/16/2016  . Perennial allergic rhinitis with seasonal variation 03/16/2016  . History of pneumococcal pneumonia 09/20/2015  . Hodgkin disease ([place]) 07/15/2014  . Breast cancer, right ([place]) 06/26/2013    Social History  Substance Use Topics  . Smoking status: Never Smoker  . Smokeless tobacco: Former Systems developer  . Alcohol use 0.0 oz/week     Comment: occas     Current Outpatient Prescriptions:  .  acetaminophen (TYLENOL) 500 MG tablet, Take 1 tablet (500 mg total) by mouth every 6 (six) hours as needed., Disp: 90  tablet, Rfl: 0 .  amLODipine (NORVASC) 5 MG tablet, Take 1 tablet (5 mg total) by mouth 1 day or 1 dose., Disp: 90 tablet, Rfl: 1 .  anastrozole (ARIMIDEX) 1 MG tablet, TAKE ONE TABLET BY MOUTH ONCE DAILY, Disp: 90 tablet, Rfl: 3 .  aspirin 81 MG tablet, Take 81 mg by mouth daily., Disp: , Rfl:  .  CALCIUM-MAGNESIUM PO, Take 1 tablet by mouth daily., Disp: , Rfl:  .  cetirizine (ZYRTEC) 10 MG tablet, Take 10 mg by mouth daily., Disp: , Rfl:  .  Cholecalciferol (VITAMIN D-3 PO), Take 1 tablet by mouth daily., Disp: , Rfl:  .  fluconazole (DIFLUCAN) 150 MG tablet, Take 1 tablet (150 mg total) by mouth every other day. (Patient not taking: Reported on 01/24/2017), Disp: 3 tablet, Rfl: 0 .  meloxicam (MOBIC) 15 MG tablet, Take 1 tablet (15 mg total) by mouth daily., Disp: 90 tablet, Rfl: 1 .  metFORMIN (GLUCOPHAGE-XR) 750 MG 24 hr tablet, Take 1 tablet (750 mg total) by mouth daily with breakfast., Disp: 90 tablet, Rfl: 1 .  metoprolol succinate (TOPROL-XL) 50 MG 24 hr tablet, Take 1 tablet (50 mg total) by mouth daily., Disp: 90 tablet, Rfl: 1 .  Multiple Vitamin (MULTIVITAMIN) tablet, Take 1 tablet by mouth daily., Disp: , Rfl:  .  pantoprazole (PROTONIX) 40 MG tablet, Take 1 tablet (40 mg total) by mouth daily., Disp: 90 tablet, Rfl: 1 .  ranitidine (ZANTAC) 150 MG tablet, Take 1 tablet (150 mg total) by mouth 2 (two) times  daily. Alternate with pantoprazole, Disp: 180 tablet, Rfl: 0  Allergies  Allergen Reactions  . Augmentin [Amoxicillin-Pot Clavulanate] Nausea Only    Review of Systems  Constitutional: Negative for chills and fever.  HENT: Positive for postnasal drip and rhinorrhea. Negative for ear pain and sore throat.   Respiratory: Positive for cough. Negative for shortness of breath and wheezing.   Musculoskeletal: Positive for myalgias.  Neurological: Negative for headaches.  Endo/Heme/Allergies: Positive for environmental allergies.   Objective  Vitals:   02/05/17 1133  BP:  122/78  Pulse: 96  Resp: 16  Temp: 98 F (36.7 C)  SpO2: 96%  Weight: 223 lb 6 oz (101.3 kg)    Body mass index is 37.17 kg/m.  Physical Exam  Constitutional: She is oriented to person, place, and time and well-developed, well-nourished, and in no distress. No distress.  HENT:  Head: Normocephalic and atraumatic.  Mouth/Throat: No oropharyngeal exudate.  OP mildly erythematous  Eyes: Pupils are equal, round, and reactive to light.  Neck: Normal range of motion. Neck supple.  Cardiovascular: Normal rate, regular rhythm and normal heart sounds.  Exam reveals no gallop and no friction rub.   No murmur heard. Pulmonary/Chest: Effort normal and breath sounds normal. She has no wheezes. She has no rales.  Abdominal: Soft. Bowel sounds are normal. There is no tenderness.  Musculoskeletal: She exhibits no edema or tenderness.  Lymphadenopathy:    She has no cervical adenopathy.  Neurological: She is alert and oriented to person, place, and time.  Skin: Skin is warm and dry. No rash noted.  Nursing note and vitals reviewed.   Recent Results (from the past 2160 hour(s))  CBC with Differential/Platelet     Status: None   Collection Time: 01/24/17 10:25 AM  Result Value Ref Range   WBC 8.8 3.6 - 11.0 K/uL   RBC 4.55 3.80 - 5.20 MIL/uL   Hemoglobin 13.7 12.0 - 16.0 g/dL   HCT 40.9 35.0 - 47.0 %   MCV 89.9 80.0 - 100.0 fL   MCH 30.1 26.0 - 34.0 pg   MCHC 33.5 32.0 - 36.0 g/dL   RDW 14.0 11.5 - 14.5 %   Platelets 283 150 - 440 K/uL   Neutrophils Relative % 64 %   Neutro Abs 5.7 1.4 - 6.5 K/uL   Lymphocytes Relative 24 %   Lymphs Abs 2.1 1.0 - 3.6 K/uL   Monocytes Relative 8 %   Monocytes Absolute 0.7 0.2 - 0.9 K/uL   Eosinophils Relative 3 %   Eosinophils Absolute 0.2 0 - 0.7 K/uL   Basophils Relative 1 %   Basophils Absolute 0.1 0 - 0.1 K/uL  Comprehensive metabolic panel     Status: Abnormal   Collection Time: 01/24/17 10:25 AM  Result Value Ref Range   Sodium 135 135 -  145 mmol/L   Potassium 3.5 3.5 - 5.1 mmol/L   Chloride 104 101 - 111 mmol/L   CO2 25 22 - 32 mmol/L   Glucose, Bld 111 (H) 65 - 99 mg/dL   BUN 16 6 - 20 mg/dL   Creatinine, Ser 0.80 0.44 - 1.00 mg/dL   Calcium 9.4 8.9 - 10.3 mg/dL   Total Protein 7.4 6.5 - 8.1 g/dL   Albumin 4.0 3.5 - 5.0 g/dL   AST 20 15 - 41 U/L   ALT 24 14 - 54 U/L   Alkaline Phosphatase 92 38 - 126 U/L   Total Bilirubin 0.9 0.3 - 1.2 mg/dL   GFR calc  non Af Amer >60 >60 mL/min   GFR calc Af Amer >60 >60 mL/min    Comment: (NOTE) The eGFR has been calculated using the CKD EPI equation. This calculation has not been validated in all clinical situations. eGFR's persistently <60 mL/min signify possible Chronic Kidney Disease.    Anion gap 6 5 - 15  Cancer antigen 27.29     Status: None   Collection Time: 01/24/17 10:25 AM  Result Value Ref Range   CA 27.29 13.4 0.0 - 38.6 U/mL    Comment: (NOTE) [employer]/ACS methodology Performed At: [facility] [address] [place], [place] [id number] Lindon Romp MD JQ:[id number]      Assessment & Plan  1. Cough  Likely post-bronchial cough from recent URI, we will try therapy and if no improvement she will call back for antibiotics and CXR  - budesonide-formoterol (SYMBICORT) 160-4.5 MCG/ACT inhaler; Inhale 2 puffs into the lungs 2 (two) times daily.  Dispense: 1 Inhaler; Refill: 0 - benzonatate (TESSALON) 100 MG capsule; Take 1-2 capsules (100-200 mg total) by mouth 3 (three) times daily as needed.  Dispense: 40 capsule; Refill: 0

## 2017-02-28 ENCOUNTER — Encounter: Payer: Self-pay | Admitting: Family Medicine

## 2017-02-28 ENCOUNTER — Ambulatory Visit (INDEPENDENT_AMBULATORY_CARE_PROVIDER_SITE_OTHER): Payer: Medicare Other | Admitting: Family Medicine

## 2017-02-28 VITALS — BP 122/68 | HR 98 | Temp 98.2°F | Resp 16 | Ht 65.0 in | Wt 221.2 lb

## 2017-02-28 DIAGNOSIS — R7303 Prediabetes: Secondary | ICD-10-CM | POA: Diagnosis not present

## 2017-02-28 DIAGNOSIS — K219 Gastro-esophageal reflux disease without esophagitis: Secondary | ICD-10-CM

## 2017-02-28 DIAGNOSIS — M816 Localized osteoporosis [Lequesne]: Secondary | ICD-10-CM | POA: Diagnosis not present

## 2017-02-28 DIAGNOSIS — I10 Essential (primary) hypertension: Secondary | ICD-10-CM

## 2017-02-28 DIAGNOSIS — M170 Bilateral primary osteoarthritis of knee: Secondary | ICD-10-CM

## 2017-02-28 LAB — POCT GLYCOSYLATED HEMOGLOBIN (HGB A1C): HEMOGLOBIN A1C: 5.8

## 2017-02-28 MED ORDER — AMLODIPINE BESYLATE 5 MG PO TABS: 5.0000 mg | ORAL_TABLET | ORAL | 1 refills | Status: DC

## 2017-02-28 MED ORDER — MELOXICAM 15 MG PO TABS: 15.0000 mg | ORAL_TABLET | Freq: Every day | ORAL | 1 refills | Status: DC

## 2017-02-28 MED ORDER — METOPROLOL SUCCINATE ER 50 MG PO TB24: 50.0000 mg | ORAL_TABLET | Freq: Every day | ORAL | 1 refills | Status: DC

## 2017-02-28 MED ORDER — METFORMIN HCL ER 750 MG PO TB24: 750.0000 mg | ORAL_TABLET | Freq: Every day | ORAL | 1 refills | Status: DC

## 2017-02-28 MED ORDER — RANITIDINE HCL 150 MG PO TABS: 150.0000 mg | ORAL_TABLET | Freq: Two times a day (BID) | ORAL | 0 refills | Status: DC

## 2017-02-28 MED ORDER — PANTOPRAZOLE SODIUM 40 MG PO TBEC: 40.0000 mg | DELAYED_RELEASE_TABLET | Freq: Every day | ORAL | 0 refills | Status: DC

## 2017-02-28 NOTE — Progress Notes (Signed)
Name: Mary Todd   MRN: [id number]    DOB: February 22, 1946   Date:02/28/2017       Progress Note  Subjective  Chief Complaint  Chief Complaint  Patient presents with  . Hypertension  . Obesity  . Diabetes    pre diabetes    HPI   Pre-diabetes: she denies polyphagia, polydipsia or polyuria, she is on Metformin  HTN: taking medication as prescribed, no side effects. No chest pain, SOB , dizziness  or palpitation  GERD: taking Pantoprazole every other day, alternating with Ranitidine.  No regurgitation, tolerating the regiment well, no heartburn.  OA: taking Meloxicam daily, explained that it can make GERD worse, she tried stopping Meloxicam butpain was too intense, she states her whole body ached, so she went back to once daily. She had synvix and steroid injection and pain on knees and improved pain for a few months but getting worse again.   Breast Cancer: sees Dr. Jamal Collin and currently under the care of Dr. Mike Gip for her Hodgkins and Breast cancer, still on Arimidex. Had to start Prolia for osteoporosis, found on bone density of spine  Thyroid nodules: incidental finding on CT neck , had biopsy by  Dr. Gabriel Carina Dec 2017  and biopsy was negative  Obesity: she has long history of obesity, she is now on Metformin and stopped eating after 7 pm, she has lost 6 lbs since 10/2016  Cervical Radiculitis: she developed right shoulder pain around January 2017, followed by neck pain and was seen by Dr. Mack Guise - had a shoulder injection and arm pain did not improve, she also went for PT to help with neck pain, and pain got progressively worse. She states that for the past couple of months the pain was constant and getting progressively worse, described as aching and burning feeling that goes from neck down to both shoulders, but worse on the right side and radiates down to her right hand. She has noticed some weakness on right hand and has difficulty opening jars and water bottles  because of it. MRI c-spine showed stenosis and herniated disk, she was referred to neurosurgeon and Dr. Saintclair Halsted referred to the pain management side, she had two steroid injections and she is doing well now, no longer has right arm pain or paresthesia    Patient Active Problem List   Diagnosis Date Noted  . Osteoporosis 12/14/2016  . Morbid obesity ([place]) 07/17/2016  . Spinal stenosis in cervical region 05/22/2016  . Thyroid nodule 05/22/2016  . GERD without esophagitis 03/16/2016  . Neck muscle spasm 03/16/2016  . History of back surgery 03/16/2016  . Osteoarthritis of both knees 03/16/2016  . Large breasts 03/16/2016  . Hypertension, benign 03/16/2016  . Perennial allergic rhinitis with seasonal variation 03/16/2016  . History of pneumococcal pneumonia 09/20/2015  . Hodgkin disease ([place]) 07/15/2014  . Breast cancer, right ([place]) 06/26/2013    Past Surgical History:  Procedure Laterality Date  . ABDOMINAL HYSTERECTOMY    . BACK SURGERY    . BREAST BIOPSY Right 2011   radiation  . BREAST SURGERY Right 2011   right breast lumpectomy,L/SN/R for right breast CA   . COLONOSCOPY  2003  . PORT A CATH REVISION    . WRIST SURGERY  2005    Family History  Problem Relation Age of Onset  . Cancer Father   . Stroke Brother   . Breast cancer Mother 37    Social History   Social History  . Marital status: Married  Spouse name: N/A  . Number of children: N/A  . Years of education: N/A   Occupational History  . Not on file.   Social History Main Topics  . Smoking status: Never Smoker  . Smokeless tobacco: Former Systems developer  . Alcohol use 0.0 oz/week     Comment: occas  . Drug use: No  . Sexual activity: Not on file   Other Topics Concern  . Not on file   Social History Narrative  . No narrative on file     Current Outpatient Prescriptions:  .  acetaminophen (TYLENOL) 500 MG tablet, Take 1 tablet (500 mg total) by mouth every 6 (six) hours as needed., Disp: 90 tablet, Rfl:  0 .  amLODipine (NORVASC) 5 MG tablet, Take 1 tablet (5 mg total) by mouth 1 day or 1 dose., Disp: 90 tablet, Rfl: 1 .  anastrozole (ARIMIDEX) 1 MG tablet, TAKE ONE TABLET BY MOUTH ONCE DAILY, Disp: 90 tablet, Rfl: 3 .  aspirin 81 MG tablet, Take 81 mg by mouth daily., Disp: , Rfl:  .  CALCIUM-MAGNESIUM PO, Take 1 tablet by mouth daily., Disp: , Rfl:  .  cetirizine (ZYRTEC) 10 MG tablet, Take 10 mg by mouth daily., Disp: , Rfl:  .  Cholecalciferol (VITAMIN D-3 PO), Take 1 tablet by mouth daily., Disp: , Rfl:  .  meloxicam (MOBIC) 15 MG tablet, Take 1 tablet (15 mg total) by mouth daily., Disp: 90 tablet, Rfl: 1 .  metFORMIN (GLUCOPHAGE-XR) 750 MG 24 hr tablet, Take 1 tablet (750 mg total) by mouth daily with breakfast., Disp: 90 tablet, Rfl: 1 .  metoprolol succinate (TOPROL-XL) 50 MG 24 hr tablet, Take 1 tablet (50 mg total) by mouth daily., Disp: 90 tablet, Rfl: 1 .  Multiple Vitamin (MULTIVITAMIN) tablet, Take 1 tablet by mouth daily., Disp: , Rfl:  .  pantoprazole (PROTONIX) 40 MG tablet, Take 1 tablet (40 mg total) by mouth daily., Disp: 90 tablet, Rfl: 0 .  ranitidine (ZANTAC) 150 MG tablet, Take 1 tablet (150 mg total) by mouth 2 (two) times daily. Alternate with pantoprazole, Disp: 180 tablet, Rfl: 0  Allergies  Allergen Reactions  . Augmentin [Amoxicillin-Pot Clavulanate] Nausea Only     ROS  Constitutional: Negative for fever or weight change.  Respiratory: Negative for cough and shortness of breath.   Cardiovascular: Negative for chest pain or palpitations.  Gastrointestinal: Negative for abdominal pain, no bowel changes.  Musculoskeletal: Negative for gait problem or joint swelling.  Skin: Negative for rash.  Neurological: Negative for dizziness or headache.  No other specific complaints in a complete review of systems (except as listed in HPI above).   Objective  Vitals:   02/28/17 0937  BP: 122/68  Pulse: 98  Resp: 16  Temp: 98.2 F (36.8 C)  SpO2: 93%  Weight:  221 lb 3 oz (100.3 kg)  Height: 5' 5"  (1.651 m)    Body mass index is 36.81 kg/m.  Physical Exam  Constitutional: Patient appears well-developed and well-nourished. Obese No distress.  HEENT: head atraumatic, normocephalic, pupils equal and reactive to light, neck supple, throat within normal limits, no thyromegaly Cardiovascular: Normal rate, regular rhythm and normal heart sounds.  No murmur heard. No BLE edema. Pulmonary/Chest: Effort normal and breath sounds normal. No respiratory distress. Abdominal: Soft.  There is no tenderness. Psychiatric: Patient has a normal mood and affect. behavior is normal. Judgment and thought content normal. Muscular Skeletal: crepitus with extension of both knees  Recent Results (from the past 2160  hour(s))  CBC with Differential/Platelet     Status: None   Collection Time: 01/24/17 10:25 AM  Result Value Ref Range   WBC 8.8 3.6 - 11.0 K/uL   RBC 4.55 3.80 - 5.20 MIL/uL   Hemoglobin 13.7 12.0 - 16.0 g/dL   HCT 40.9 35.0 - 47.0 %   MCV 89.9 80.0 - 100.0 fL   MCH 30.1 26.0 - 34.0 pg   MCHC 33.5 32.0 - 36.0 g/dL   RDW 14.0 11.5 - 14.5 %   Platelets 283 150 - 440 K/uL   Neutrophils Relative % 64 %   Neutro Abs 5.7 1.4 - 6.5 K/uL   Lymphocytes Relative 24 %   Lymphs Abs 2.1 1.0 - 3.6 K/uL   Monocytes Relative 8 %   Monocytes Absolute 0.7 0.2 - 0.9 K/uL   Eosinophils Relative 3 %   Eosinophils Absolute 0.2 0 - 0.7 K/uL   Basophils Relative 1 %   Basophils Absolute 0.1 0 - 0.1 K/uL  Comprehensive metabolic panel     Status: Abnormal   Collection Time: 01/24/17 10:25 AM  Result Value Ref Range   Sodium 135 135 - 145 mmol/L   Potassium 3.5 3.5 - 5.1 mmol/L   Chloride 104 101 - 111 mmol/L   CO2 25 22 - 32 mmol/L   Glucose, Bld 111 (H) 65 - 99 mg/dL   BUN 16 6 - 20 mg/dL   Creatinine, Ser 0.80 0.44 - 1.00 mg/dL   Calcium 9.4 8.9 - 10.3 mg/dL   Total Protein 7.4 6.5 - 8.1 g/dL   Albumin 4.0 3.5 - 5.0 g/dL   AST 20 15 - 41 U/L   ALT 24 14 -  54 U/L   Alkaline Phosphatase 92 38 - 126 U/L   Total Bilirubin 0.9 0.3 - 1.2 mg/dL   GFR calc non Af Amer >60 >60 mL/min   GFR calc Af Amer >60 >60 mL/min    Comment: (NOTE) The eGFR has been calculated using the CKD EPI equation. This calculation has not been validated in all clinical situations. eGFR's persistently <60 mL/min signify possible Chronic Kidney Disease.    Anion gap 6 5 - 15  Cancer antigen 27.29     Status: None   Collection Time: 01/24/17 10:25 AM  Result Value Ref Range   CA 27.29 13.4 0.0 - 38.6 U/mL    Comment: (NOTE) [employer]/ACS methodology Performed At: [facility] [place], [place] [id number] Lindon Romp MD [id number]   POCT HgB A1C     Status: Normal   Collection Time: 02/28/17  9:54 AM  Result Value Ref Range   Hemoglobin A1C 5.8       PHQ2/9: Depression screen [facility] 2/9 10/31/2016 07/17/2016 03/16/2016 12/17/2015 09/20/2015  Decreased Interest 0 0 0 0 0  Down, Depressed, Hopeless 0 0 0 0 0  PHQ - 2 Score 0 0 0 0 0    Fall Risk: Fall Risk  10/31/2016 07/17/2016 03/16/2016 12/17/2015 10/27/2015  Falls in the past year? No No Yes No No  Number falls in past yr: - - 2 or more - -  Injury with Fall? - - No - -     Assessment & Plan  1. Pre-diabetes  - POCT HgB A1C - metFORMIN (GLUCOPHAGE-XR) 750 MG 24 hr tablet; Take 1 tablet (750 mg total) by mouth daily with breakfast.  Dispense: 90 tablet; Refill: 1  2. Hypertension, benign  - metoprolol succinate (TOPROL-XL) 50 MG 24 hr  tablet; Take 1 tablet (50 mg total) by mouth daily.  Dispense: 90 tablet; Refill: 1 - amLODipine (NORVASC) 5 MG tablet; Take 1 tablet (5 mg total) by mouth 1 day or 1 dose.  Dispense: 90 tablet; Refill: 1  3. GERD without esophagitis  She is weaning off Protonix, taking it every other day - ranitidine (ZANTAC) 150 MG tablet; Take 1 tablet (150 mg total) by mouth 2 (two) times daily. Alternate with pantoprazole  Dispense: 180 tablet; Refill:  0 - pantoprazole (PROTONIX) 40 MG tablet; Take 1 tablet (40 mg total) by mouth daily.  Dispense: 90 tablet; Refill: 0  4. Morbid obesity, unspecified obesity type ([place])  Discussed GLP-1 agonist and advised her to make sure it is okay for her to start medication . She will discuss it with Dr. Gabriel Carina  - metFORMIN (GLUCOPHAGE-XR) 750 MG 24 hr tablet; Take 1 tablet (750 mg total) by mouth daily with breakfast.  Dispense: 90 tablet; Refill: 1  5. Primary osteoarthritis of both knees  Explained again the risk of long term use of NSAID's, but she has been unable to function without medication secondary to pain - meloxicam (MOBIC) 15 MG tablet; Take 1 tablet (15 mg total) by mouth daily.  Dispense: 90 tablet; Refill: 1  6. Localized osteoporosis without current pathological fracture  On spine, Bone density 11/2016 and started on Prolia by hematologist in March 2018

## 2017-02-28 NOTE — Patient Instructions (Signed)
Ask Dr. Gabriel Carina if you can try GLP-1 agonist - like Victoza to help you lose weight and stop Metformin.

## 2017-03-29 DIAGNOSIS — L304 Erythema intertrigo: Secondary | ICD-10-CM | POA: Diagnosis not present

## 2017-04-12 ENCOUNTER — Other Ambulatory Visit: Payer: Self-pay

## 2017-04-12 DIAGNOSIS — E042 Nontoxic multinodular goiter: Secondary | ICD-10-CM | POA: Diagnosis not present

## 2017-04-12 DIAGNOSIS — Z170 Estrogen receptor positive status [ER+]: Principal | ICD-10-CM

## 2017-04-12 DIAGNOSIS — C50511 Malignant neoplasm of lower-outer quadrant of right female breast: Secondary | ICD-10-CM

## 2017-04-12 DIAGNOSIS — E041 Nontoxic single thyroid nodule: Secondary | ICD-10-CM | POA: Diagnosis not present

## 2017-04-19 DIAGNOSIS — L304 Erythema intertrigo: Secondary | ICD-10-CM | POA: Diagnosis not present

## 2017-04-20 DIAGNOSIS — E669 Obesity, unspecified: Secondary | ICD-10-CM | POA: Diagnosis not present

## 2017-04-20 DIAGNOSIS — E042 Nontoxic multinodular goiter: Secondary | ICD-10-CM | POA: Diagnosis not present

## 2017-06-21 ENCOUNTER — Ambulatory Visit
Admission: RE | Admit: 2017-06-21 | Discharge: 2017-06-21 | Disposition: A | Discharge status: Home / Self Care | Payer: Medicare Other | Source: Ambulatory Visit | Attending: General Surgery | Admitting: General Surgery

## 2017-06-21 DIAGNOSIS — Z170 Estrogen receptor positive status [ER+]: Secondary | ICD-10-CM | POA: Insufficient documentation

## 2017-06-21 DIAGNOSIS — C50511 Malignant neoplasm of lower-outer quadrant of right female breast: Secondary | ICD-10-CM | POA: Insufficient documentation

## 2017-06-21 DIAGNOSIS — R928 Other abnormal and inconclusive findings on diagnostic imaging of breast: Secondary | ICD-10-CM | POA: Diagnosis not present

## 2017-06-26 ENCOUNTER — Ambulatory Visit (INDEPENDENT_AMBULATORY_CARE_PROVIDER_SITE_OTHER): Payer: Medicare Other | Admitting: General Surgery

## 2017-06-26 ENCOUNTER — Encounter: Payer: Self-pay | Admitting: General Surgery

## 2017-06-26 VITALS — BP 132/70 | Resp 12 | Ht 67.0 in | Wt 215.0 lb

## 2017-06-26 DIAGNOSIS — Z170 Estrogen receptor positive status [ER+]: Secondary | ICD-10-CM

## 2017-06-26 DIAGNOSIS — Z8571 Personal history of Hodgkin lymphoma: Secondary | ICD-10-CM | POA: Diagnosis not present

## 2017-06-26 DIAGNOSIS — C50211 Malignant neoplasm of upper-inner quadrant of right female breast: Secondary | ICD-10-CM | POA: Diagnosis not present

## 2017-06-26 NOTE — Patient Instructions (Addendum)
The patient has been asked to return to the office in one year with a bilateral screening mammogram with Dr. Collene Schlichter . The patient is aware to call back for any questions or concerns.s.

## 2017-06-26 NOTE — Progress Notes (Signed)
Patient ID: Mary Todd, female   DOB: [birth year], 71 y.o.   MRN: [id number]  No chief complaint on file.   HPI Mary Todd is a 71 y.o. female who presents for a breast cancer follow up. The most recent mammogram was done on 06/21/2017.  Patient does perform regular self breast checks and gets regular mammograms done.  No breast complaints Pt tolerating Anastrazole HPI  Past Medical History:  Diagnosis Date  . Breast cancer ([place]) right   2011, T1A N0, radiation  . Esophageal reflux   . Hodgkin's disease ([place]) 2004  . Hypertension 2006  . Personal history of malignant neoplasm of breast 2011   right breast    Past Surgical History:  Procedure Laterality Date  . ABDOMINAL HYSTERECTOMY    . BACK SURGERY    . BREAST BIOPSY Right 2011   radiation  . BREAST SURGERY Right 2011   right breast lumpectomy,L/SN/R for right breast CA   . COLONOSCOPY  2003  . PORT A CATH REVISION    . WRIST SURGERY  2005    Family History  Problem Relation Age of Onset  . Cancer Father   . Stroke Brother   . Breast cancer Mother 27    Social History Social History  Substance Use Topics  . Smoking status: Never Smoker  . Smokeless tobacco: Former Systems developer  . Alcohol use 0.0 oz/week     Comment: occas    Allergies  Allergen Reactions  . Augmentin [Amoxicillin-Pot Clavulanate] Nausea Only    Current Outpatient Prescriptions  Medication Sig Dispense Refill  . acetaminophen (TYLENOL) 500 MG tablet Take 1 tablet (500 mg total) by mouth every 6 (six) hours as needed. 90 tablet 0  . amLODipine (NORVASC) 5 MG tablet Take 1 tablet (5 mg total) by mouth 1 day or 1 dose. 90 tablet 1  . anastrozole (ARIMIDEX) 1 MG tablet TAKE ONE TABLET BY MOUTH ONCE DAILY 90 tablet 3  . aspirin 81 MG tablet Take 81 mg by mouth daily.    Mar[place] Kitchen CALCIUM-MAGNESIUM PO Take 1 tablet by mouth daily.    . cetirizine (ZYRTEC) 10 MG tablet Take 10 mg by mouth daily.    . Cholecalciferol (VITAMIN D-3 PO) Take  1 tablet by mouth daily.    . meloxicam (MOBIC) 15 MG tablet Take 1 tablet (15 mg total) by mouth daily. 90 tablet 1  . metFORMIN (GLUCOPHAGE-XR) 750 MG 24 hr tablet Take 1 tablet (750 mg total) by mouth daily with breakfast. 90 tablet 1  . metoprolol succinate (TOPROL-XL) 50 MG 24 hr tablet Take 1 tablet (50 mg total) by mouth daily. 90 tablet 1  . Multiple Vitamin (MULTIVITAMIN) tablet Take 1 tablet by mouth daily.    . pantoprazole (PROTONIX) 40 MG tablet Take 1 tablet (40 mg total) by mouth daily. 90 tablet 0  . ranitidine (ZANTAC) 150 MG tablet Take 1 tablet (150 mg total) by mouth 2 (two) times daily. Alternate with pantoprazole 180 tablet 0   No current facility-administered medications for this visit.     Review of Systems Review of Systems  Constitutional: Negative.   Respiratory: Negative.   Cardiovascular: Negative.     Blood pressure 132/70, resp. rate 12, height 5\' 7"  (1.702 m), weight 215 lb (97.5 kg).  Physical Exam Physical Exam  Constitutional: She is oriented to person, place, and time. She appears well-developed and well-nourished.  Eyes: Conjunctivae are normal. No scleral icterus.  Neck: Neck supple.  Cardiovascular: Normal rate, regular  rhythm and normal heart sounds.   Pulmonary/Chest: Effort normal and breath sounds normal. Right breast exhibits no inverted nipple, no mass, no nipple discharge, no skin change and no tenderness. Left breast exhibits no inverted nipple, no mass, no nipple discharge, no skin change and no tenderness.  Abdominal: Soft. Bowel sounds are normal. There is no tenderness.  Lymphadenopathy:    She has no cervical adenopathy.    She has no axillary adenopathy.  Neurological: She is alert and oriented to person, place, and time.  Skin: Skin is warm and dry.    Data Reviewed Mammogram reviewed   Assessment      History of Hodgkin disease History of right breast cancer, stage IA. She is now 27yrs out and continued on Anastrazole  based on BCI Exam is stable.  Plan       The patient has been asked to return to the office in one year with a bilateral screening mammogram with Dr. Collene Schlichter . The patient is aware to call back for any questions or concerns.   HPI, Physical Exam, Assessment and Plan have been scribed under the direction and in the presence of Mckinley Jewel, MD  Gaspar Cola, CMA I have completed the exam and reviewed the above documentation for accuracy and completeness.  I agree with the above.  Haematologist has been used and any errors in dictation or transcription are unintentional.  Seeplaputhur G. Jamal Collin, M.D., F.A.C.S.  Junie Panning G 06/26/2017, 6:39 PM

## 2017-07-03 ENCOUNTER — Ambulatory Visit (INDEPENDENT_AMBULATORY_CARE_PROVIDER_SITE_OTHER): Payer: Medicare Other | Admitting: Family Medicine

## 2017-07-03 ENCOUNTER — Encounter: Payer: Self-pay | Admitting: Family Medicine

## 2017-07-03 VITALS — BP 130/84 | HR 90 | Temp 98.1°F | Resp 16 | Ht 67.0 in | Wt 215.3 lb

## 2017-07-03 DIAGNOSIS — M170 Bilateral primary osteoarthritis of knee: Secondary | ICD-10-CM

## 2017-07-03 DIAGNOSIS — M2240 Chondromalacia patellae, unspecified knee: Secondary | ICD-10-CM | POA: Insufficient documentation

## 2017-07-03 DIAGNOSIS — I10 Essential (primary) hypertension: Secondary | ICD-10-CM

## 2017-07-03 DIAGNOSIS — K219 Gastro-esophageal reflux disease without esophagitis: Secondary | ICD-10-CM

## 2017-07-03 DIAGNOSIS — R7303 Prediabetes: Secondary | ICD-10-CM | POA: Diagnosis not present

## 2017-07-03 DIAGNOSIS — Z23 Encounter for immunization: Secondary | ICD-10-CM

## 2017-07-03 DIAGNOSIS — C50911 Malignant neoplasm of unspecified site of right female breast: Secondary | ICD-10-CM | POA: Diagnosis not present

## 2017-07-03 LAB — POCT GLYCOSYLATED HEMOGLOBIN (HGB A1C): Hemoglobin A1C: 5.4

## 2017-07-03 MED ORDER — METOPROLOL SUCCINATE ER 50 MG PO TB24: 50.0000 mg | ORAL_TABLET | Freq: Every day | ORAL | 1 refills | Status: DC

## 2017-07-03 MED ORDER — PANTOPRAZOLE SODIUM 40 MG PO TBEC: 40.0000 mg | DELAYED_RELEASE_TABLET | Freq: Every day | ORAL | 1 refills | Status: DC

## 2017-07-03 MED ORDER — AMLODIPINE BESYLATE 5 MG PO TABS: 5.0000 mg | ORAL_TABLET | ORAL | 1 refills | Status: DC

## 2017-07-03 MED ORDER — MELOXICAM 15 MG PO TABS: 15.0000 mg | ORAL_TABLET | Freq: Every day | ORAL | 1 refills | Status: DC

## 2017-07-03 MED ORDER — METFORMIN HCL ER 750 MG PO TB24: 750.0000 mg | ORAL_TABLET | Freq: Every day | ORAL | 1 refills | Status: DC

## 2017-07-03 NOTE — Progress Notes (Signed)
Name: Mary Todd   MRN: [id number]    DOB: 1946-03-17   Date:07/03/2017       Progress Note  Subjective  Chief Complaint  Chief Complaint  Patient presents with  . Medication Refill    4 month F/U  . Gastroesophageal Reflux    Ran out of Pantoprazole and was taking Ranitidine which was not controlling her symptoms. She was having the acid coming back in her throat with just taking the Ranitidine.  . Hypertension    Denies any symptoms  . Osteoarthritis    Takes Meloxicam and Tylenol together if symptoms are bad    HPI  Pre-diabetes: she denies polyphagia, polydipsia or polyuria, she is on Metformin, denies side effects of medication.   HTN: taking medication as prescribed, no side effects. No chest pain, SOB , dizziness  or palpitation. BP is at goal at home, 130's/80's  GERD: taking she tried stopping Pantoprazole but as soon as she went off medication symptoms became severe, with heartburn, and regurgitation, and is back on medication, and having to take Ranitidine prn now  OA: taking Meloxicam daily, explained that it can make GERD worse, she tried stopping Meloxicam but pain was too intense, she states her whole body ached, so she went back to once daily. She had synvix and steroid injection back in Feb 2018  and pain on knees improved.   Breast Cancer: sees Dr. Jamal Collin and currently under the care of Dr. Mike Gip for her Hodgkins and Breast cancer, still on Arimidex. Had to start Prolia for osteoporosis, found on bone density of spine, she is tolerating medications well, last mammogram was normal   Thyroid nodules: incidental finding on CT neck , had biopsy by  Dr. Gabriel Carina Dec 2017  and biopsy was negative, still has regular follow ups with her.   Obesity: she has long history of obesity, she is on Metformin, she is doing well, Dr. Gabriel Carina gave her phentermine to curb her appetite, she has noticed she is eating less, but only lost 3 lbs since last visit, not affecting  bp - still at goal   Patient Active Problem List   Diagnosis Date Noted  . Chondromalacia patellae 07/03/2017  . Osteoporosis 12/14/2016  . Morbid obesity ([place]) 07/17/2016  . Spinal stenosis in cervical region 05/22/2016  . Thyroid nodule 05/22/2016  . GERD without esophagitis 03/16/2016  . Neck muscle spasm 03/16/2016  . History of back surgery 03/16/2016  . Osteoarthritis of both knees 03/16/2016  . Large breasts 03/16/2016  . Hypertension, benign 03/16/2016  . Perennial allergic rhinitis with seasonal variation 03/16/2016  . History of pneumococcal pneumonia 09/20/2015  . Hodgkin disease ([place]) 07/15/2014  . Breast cancer, right ([place]) 06/26/2013    Past Surgical History:  Procedure Laterality Date  . ABDOMINAL HYSTERECTOMY    . BACK SURGERY    . BREAST BIOPSY Right 2011   radiation  . BREAST SURGERY Right 2011   right breast lumpectomy,L/SN/R for right breast CA   . COLONOSCOPY  2003  . PORT A CATH REVISION    . WRIST SURGERY  2005    Family History  Problem Relation Age of Onset  . Cancer Father   . Stroke Brother   . Breast cancer Mother 64    Social History   Social History  . Marital status: Married    Spouse name: N/A  . Number of children: N/A  . Years of education: N/A   Occupational History  . Not on file.  Social History Main Topics  . Smoking status: Never Smoker  . Smokeless tobacco: Former Systems developer  . Alcohol use 0.0 oz/week     Comment: occas  . Drug use: No  . Sexual activity: Not on file   Other Topics Concern  . Not on file   Social History Narrative  . No narrative on file     Current Outpatient Prescriptions:  .  acetaminophen (TYLENOL) 650 MG CR tablet, Take 1,300 mg by mouth every 8 (eight) hours as needed for pain., Disp: , Rfl:  .  amLODipine (NORVASC) 5 MG tablet, Take 1 tablet (5 mg total) by mouth 1 day or 1 dose., Disp: 90 tablet, Rfl: 1 .  anastrozole (ARIMIDEX) 1 MG tablet, TAKE ONE TABLET BY MOUTH ONCE DAILY, Disp: 90  tablet, Rfl: 3 .  aspirin 81 MG tablet, Take 81 mg by mouth daily., Disp: , Rfl:  .  CALCIUM-MAGNESIUM PO, Take 1 tablet by mouth daily., Disp: , Rfl:  .  cetirizine (ZYRTEC) 10 MG tablet, Take 10 mg by mouth daily., Disp: , Rfl:  .  Cholecalciferol (VITAMIN D-3 PO), Take 1 tablet by mouth daily., Disp: , Rfl:  .  Iodoquinol-HC-Aloe Polysacch (ALCORTIN EX), Apply topically 2 (two) times daily as needed., Disp: , Rfl:  .  meloxicam (MOBIC) 15 MG tablet, Take 1 tablet (15 mg total) by mouth daily., Disp: 90 tablet, Rfl: 1 .  metFORMIN (GLUCOPHAGE-XR) 750 MG 24 hr tablet, Take 1 tablet (750 mg total) by mouth daily with breakfast., Disp: 90 tablet, Rfl: 1 .  metoprolol succinate (TOPROL-XL) 50 MG 24 hr tablet, Take 1 tablet (50 mg total) by mouth daily., Disp: 90 tablet, Rfl: 1 .  Multiple Vitamin (MULTIVITAMIN) tablet, Take 1 tablet by mouth daily., Disp: , Rfl:  .  nystatin (MYCOSTATIN/NYSTOP) powder, Apply topically 2 (two) times daily., Disp: , Rfl:  .  pantoprazole (PROTONIX) 40 MG tablet, Take 1 tablet (40 mg total) by mouth daily., Disp: 90 tablet, Rfl: 0 .  phentermine 15 MG capsule, Take 15 mg by mouth every morning., Disp: , Rfl:  .  ranitidine (ZANTAC) 150 MG tablet, Take 1 tablet (150 mg total) by mouth 2 (two) times daily. Alternate with pantoprazole, Disp: 180 tablet, Rfl: 0  Allergies  Allergen Reactions  . Augmentin [Amoxicillin-Pot Clavulanate] Nausea Only     ROS  Constitutional: Negative for fever or weight change.  Respiratory: Negative for cough and shortness of breath.   Cardiovascular: Negative for chest pain or palpitations.  Gastrointestinal: Negative for abdominal pain, no bowel changes.  Musculoskeletal: Positive  for gait problem intermittently ( feels stiff when first stands up) no joint swelling.  Skin: Negative for rash.  Neurological: Negative for dizziness or headache.  No other specific complaints in a complete review of systems (except as listed in HPI  above).  Objective  Vitals:   07/03/17 1015  BP: 130/84  Pulse: 90  Resp: 16  Temp: 98.1 F (36.7 C)  TempSrc: Oral  SpO2: 95%  Weight: 215 lb 4.8 oz (97.7 kg)  Height: 5\' 7"  (1.702 m)    Body mass index is 33.72 kg/m.  Physical Exam  Constitutional: Patient appears well-developed and well-nourished. Obese  No distress.  HEENT: head atraumatic, normocephalic, pupils equal and reactive to light,  neck supple, throat within normal limits Cardiovascular: Normal rate, regular rhythm and normal heart sounds.  No murmur heard. No BLE edema. Pulmonary/Chest: Effort normal and breath sounds normal. No respiratory distress. Abdominal: Soft.  There  is no tenderness. Psychiatric: Patient has a normal mood and affect. behavior is normal. Judgment and thought content normal. Muscular Skeletal: normal gait, no crepitus with extension of both knees.   PHQ2/9: Depression screen [facility] 2/9 07/03/2017 10/31/2016 07/17/2016 03/16/2016 12/17/2015  Decreased Interest 0 0 0 0 0  Down, Depressed, Hopeless 0 0 0 0 0  PHQ - 2 Score 0 0 0 0 0     Fall Risk: Fall Risk  07/03/2017 10/31/2016 07/17/2016 03/16/2016 12/17/2015  Falls in the past year? No No No Yes No  Number falls in past yr: - - - 2 or more -  Injury with Fall? - - - No -     Functional Status Survey: Is the patient deaf or have difficulty hearing?: No Does the patient have difficulty seeing, even when wearing glasses/contacts?: No Does the patient have difficulty concentrating, remembering, or making decisions?: No Does the patient have difficulty walking or climbing stairs?: No Does the patient have difficulty dressing or bathing?: No Does the patient have difficulty doing errands alone such as visiting a doctor's office or shopping?: No   Assessment & Plan  1. Hypertension, benign  - amLODipine (NORVASC) 5 MG tablet; Take 1 tablet (5 mg total) by mouth 1 day or 1 dose.  Dispense: 90 tablet; Refill: 1 - metoprolol succinate  (TOPROL-XL) 50 MG 24 hr tablet; Take 1 tablet (50 mg total) by mouth daily.  Dispense: 90 tablet; Refill: 1  2. Needs flu shot  - Flu vaccine HIGH DOSE PF (Fluzone High dose)  3. GERD without esophagitis  - pantoprazole (PROTONIX) 40 MG tablet; Take 1 tablet (40 mg total) by mouth daily.  Dispense: 90 tablet; Refill: 1  4. Primary osteoarthritis of both knees  She tried weaning self off Meloxicam but unable because increases pain level, she is aware of risks. Seeing Ortho prn and had synvisc injection about 7 months ago and is doing well at this time - meloxicam (MOBIC) 15 MG tablet; Take 1 tablet (15 mg total) by mouth daily.  Dispense: 90 tablet; Refill: 1  5. Morbid obesity, unspecified obesity type ([place])  - metFORMIN (GLUCOPHAGE-XR) 750 MG 24 hr tablet; Take 1 tablet (750 mg total) by mouth daily with breakfast.  Dispense: 90 tablet; Refill: 1  6. Pre-diabetes  - metFORMIN (GLUCOPHAGE-XR) 750 MG 24 hr tablet; Take 1 tablet (750 mg total) by mouth daily with breakfast.  Dispense: 90 tablet; Refill: 1 - POCT glycosylated hemoglobin (Hb A1C)  7. Malignant neoplasm of right female breast, unspecified estrogen receptor status, unspecified site of breast [facility])  She is on Arimidex, sees oncologist twice a year  and surgeon yearly, doing well, last labs were done 12/2016

## 2017-07-24 NOTE — Progress Notes (Deleted)
[facility] day:  07/24/2017  Chief Complaint: Mary Todd is a 71 y.o. female with stage II Hodgkin's disease (2004) and stage IA right breast cancer (2011) who is seen for 6 month assessment.  HPI: The patient was last seen in the medical oncology clinic on 01/24/2017.  At that time, she noted chronic pain in her neck and her knees. She had just returned from 14 day trip to [place]. She had 3 month history of night sweats. CBC and CMP were unremarkable. CA27.29 was normal at 13.4. Patient continued on Arimidex 1 mg daily. She was scheduled to have follow-up mammogram in 05/2017. Additionally, she was scheduled to have follow-up thyroid ultrasound in 03/2017.  She received Prolia on 01/24/2017.  Thyroid ultrasound on 04/13/2007 revealed a 2.3cm solid and cystic nodule within the left thyroid gland. There was also a small nodule within the right thyroid gland that did not meet size criteria for measurement. There was a single benign appearing lymph node within the right neck. FNA was recommended if not previously done with patient's biopsy.  [place] clinic records indicate that FNA in 06/2016 was "indeterminate". FNA was subsequently repeated in 08/2016 and was "benign".  She had follow-up after the thyroid ultrasound by Dr. Gabriel Carina on 04/20/2017.  No repeat biopsy was felt necessary.  Follow-up ultrasound in 1 year was discussed.  Diagnostic mammogram done on 06/21/2017 revealed stable postsurgical changes in the right breast. There was no evidence of malignancy. Recommendations were to repeat a bilateral screening mammogram in 1 year.  Symptomatically,   Past Medical History:  Diagnosis Date  . Breast cancer ([place]) right   2011, T1A N0, radiation  . Esophageal reflux   . Hodgkin's disease ([place]) 2004  . Hypertension 2006  . Personal history of malignant neoplasm of breast 2011   right breast    Past Surgical History:  Procedure  Laterality Date  . ABDOMINAL HYSTERECTOMY    . BACK SURGERY    . BREAST BIOPSY Right 2011   radiation  . BREAST SURGERY Right 2011   right breast lumpectomy,L/SN/R for right breast CA   . COLONOSCOPY  2003  . PORT A CATH REVISION    . WRIST SURGERY  2005    Family History  Problem Relation Age of Onset  . Cancer Father   . Stroke Brother   . Breast cancer Mother 52    Social History:  reports that she has never smoked. She has quit using smokeless tobacco. She reports that she drinks alcohol. She reports that she does not use drugs.  She is retired.  She previously worked for [employer].  She went on a 14-day trip to [place] in March 2018 with a group of 20 acquaintances.  She is alone today.  Allergies:  Allergies  Allergen Reactions  . Augmentin [Amoxicillin-Pot Clavulanate] Nausea Only    Current Medications: Current Outpatient Prescriptions  Medication Sig Dispense Refill  . acetaminophen (TYLENOL) 650 MG CR tablet Take 1,300 mg by mouth every 8 (eight) hours as needed for pain.    Marland Kitchen amLODipine (NORVASC) 5 MG tablet Take 1 tablet (5 mg total) by mouth 1 day or 1 dose. 90 tablet 1  . anastrozole (ARIMIDEX) 1 MG tablet TAKE ONE TABLET BY MOUTH ONCE DAILY 90 tablet 3  . aspirin 81 MG tablet Take 81 mg by mouth daily.    Marland Kitchen CALCIUM-MAGNESIUM PO Take 1 tablet by mouth daily.    Marland Kitchen  cetirizine (ZYRTEC) 10 MG tablet Take 10 mg by mouth daily.    . Cholecalciferol (VITAMIN D-3 PO) Take 1 tablet by mouth daily.    Jerrye Beavers Polysacch (ALCORTIN EX) Apply topically 2 (two) times daily as needed.    . meloxicam (MOBIC) 15 MG tablet Take 1 tablet (15 mg total) by mouth daily. 90 tablet 1  . metFORMIN (GLUCOPHAGE-XR) 750 MG 24 hr tablet Take 1 tablet (750 mg total) by mouth daily with breakfast. 90 tablet 1  . metoprolol succinate (TOPROL-XL) 50 MG 24 hr tablet Take 1 tablet (50 mg total) by mouth daily. 90 tablet 1  . Multiple Vitamin (MULTIVITAMIN) tablet Take  1 tablet by mouth daily.    Marland Kitchen nystatin (MYCOSTATIN/NYSTOP) powder Apply topically 2 (two) times daily.    . pantoprazole (PROTONIX) 40 MG tablet Take 1 tablet (40 mg total) by mouth daily. 90 tablet 1  . phentermine 15 MG capsule Take 15 mg by mouth every morning.    . ranitidine (ZANTAC) 150 MG tablet Take 1 tablet (150 mg total) by mouth 2 (two) times daily. Alternate with pantoprazole 180 tablet 0   No current facility-administered medications for this visit.     Review of Systems:  GENERAL:  Feels well.  Active.  No fevers, sweats or weight loss. PERFORMANCE STATUS (ECOG):  1 HEENT:  No visual changes, runny nose, sore throat, mouth sores or tenderness. Lungs: No shortness of breath or cough.  No hemoptysis. Cardiac:  No chest pain, palpitations, orthopnea, or PND. GI:  No nausea, vomiting, diarrhea, constipation, melena or hematochezia.  Colonoscopy on 06/26/2012. GU:  No urgency, frequency, dysuria, or hematuria. Musculoskeletal:  Ruptured disk x 1 injection.  Arthritis in knee x 3 injections.  No muscle tenderness. Extremities:  No pain or swelling. Skin:  No rashes or skin changes. Neuro:  No headache, numbness or weakness, balance or coordination issues. Endocrine:  Nodules on thyroid (benign).  Night sweats.  No diabetes or hot flashes.  Psych:  No mood changes, depression or anxiety. Pain:  No focal pain. Review of systems:  All other systems reviewed and found to be negative.  Physical Exam: There were no vitals taken for this visit. GENERAL:  Well developed, well nourished, woman sitting comfortably in the exam room in no acute distress. MENTAL STATUS:  Alert and oriented to person, place and time. HEAD:  Short light brown hair.  Normocephalic, atraumatic, face symmetric, no Cushingoid features. EYES:  Glasses.  Brown eyes.  Pupils equal round and reactive to light and accomodation.  No conjunctivitis or scleral icterus. ENT:  Oropharynx clear without lesion.  Tongue  normal. Mucous membranes moist.  RESPIRATORY:  Clear to auscultation without rales, wheezes or rhonchi. CARDIOVASCULAR:  Regular rate and rhythm without murmur, rub or gallop. BREAST:  Large breasts.  Right breast with 3 cm area of scarring at the 10 o'clock position (chronic).  No masses, skin changes or nipple discharge.  Left breast with fibrocystic changes upper quadrants.  No masses, skin changes or nipple discharge.  ABDOMEN:  Soft, non-tender, with active bowel sounds, and no appreciable hepatosplenomegaly.  No masses. SKIN:  Freckles.  No rashes, ulcers or lesions. EXTREMITIES: No edema, no skin discoloration or tenderness.  No palpable cords. LYMPH NODES: No palpable cervical, supraclavicular, axillary or inguinal adenopathy  NEUROLOGICAL: Unremarkable. PSYCH:  Appropriate.   No visits with results within 3 Day(s) from this visit.  Latest known visit with results is:  Office Visit on 07/03/2017  Component Date  Value Ref Range Status  . Hemoglobin A1C 07/03/2017 5.4   Final    Assessment:  Mary Todd is a 71 y.o. female with stage II Hodgkin's disease (2004) and stage IA right breast cancer (2011).  She was diagnosed with stage IIA Hodgkin's disease after presenting with low counts and joint pain.  Abdomen and pelvic CT scan as well as PET scan revealed a pelvic mass and retroperitoneal lymphadenopathy.  Biopsy confirmed Hodgkin's disease.    She received ABVD times 5 through cycle 3-A and then AVD without the Bleomycin times 7 subsequent cycles.  Treatment completed in 12/2003.    CT and PET scans were negative in 05/2006 and in 06/2006.  She underwent right breast lumpectomy and sentinel lymph node biopsy on 07/21/2010.  Pathology revealed a 0.5 cm grade II invasive ductal carcinoma.  Two sentinel lymph nodes were negative.  Tumor was ER + (>90%), PR + (40%), and Her2/neu 1+.  Pathologic stage was [id number].  She received Mammosite radiation.  Oncotype DX testing  revealed a low risk lesion.  BRCA1/2 testing was negative.  She received 5 years of an aromatase inhibitor.  Breast cancer index (BCI) testing revealed a 6.3% (CI: 2.9%-9.6%) risk of late recurrent disease (years 5-10) after 5 years of hormonal therapy and a high likelihood of benefit from continued hormonal therapy (30% reduction).  Decision was made to continue Arimidex for 5 more years.  Bilateral mammogram on 06/20/2016 and 06/21/2017  revealed no evidence of recurrent disease (ordered by Dr. Jamal Collin).  CA27.29 has been followed:  8.4 on 07/19/2016 and 13.4 on 01/24/2017.  Thyroid ultrasound  on 04/13/2007 revealed a 2.3 cm solid and cystic nodule within the left thyroid gland. There was also a small nodule within the right thyroid gland that did not meet size criteria for measurement. There was a single benign appearing lymph node within the right neck.  FNA in 06/2016 was  "indeterminate" and in 08/2016 was "benign".    Bone density study on 09/05/2011 revealed osteopenia with a  T-score of -1.7 in L1-L4.  Bone density study on 11/14/2012 revealed osteopenia with a  T-score of -1.5 in L1-L4.  Bone density study on 12/14/2016 revealed osteoporosis in L2-3 with a T-score of -3.1.  Left femur normal with T-score of -0.8.  She began Prolia on 01/24/2017.  Symptomatically, she is doing well.  She denies any B symptoms.  She denies any breast concerns.    Plan: 1.  Labs today:  CBC with differential, CMP, CA 27.29. 2.  Review mammogram - no evidence of malignancy. 3.  Review thyroid ultrasound -  2.3 cm solid and cystic nodule within the left thyroid gland. Small nodule in the right thyroid gland.  4.  Prolia today.  5.  Continue Arimidex 1 mg daily. 6.  Continue calcium 1200 mg and vitamin D 800 IU daily.    7.  RTC in 6 months for MD assess, labs (CBC with diff, CMP, CA27.29), and Prolia.   Honor Loh, NP 07/25/2017, 4:40 AM   I saw and evaluated the patient, participating in the key  portions of the service and reviewing pertinent diagnostic studies and records.  I reviewed the nurse practitioner's note and agree with the findings and the plan.  The assessment and plan were discussed with the patient.  A few  questions were asked by the patient and answered.   Lequita Asal, MD 07/25/2017,4:40 AM

## 2017-07-25 ENCOUNTER — Inpatient Hospital Stay: Payer: Medicare Other

## 2017-07-25 ENCOUNTER — Other Ambulatory Visit: Payer: Self-pay

## 2017-07-25 ENCOUNTER — Inpatient Hospital Stay: Payer: Medicare Other | Admitting: Hematology and Oncology

## 2017-07-25 DIAGNOSIS — C50911 Malignant neoplasm of unspecified site of right female breast: Secondary | ICD-10-CM

## 2017-07-31 DIAGNOSIS — E042 Nontoxic multinodular goiter: Secondary | ICD-10-CM | POA: Diagnosis not present

## 2017-07-31 DIAGNOSIS — E669 Obesity, unspecified: Secondary | ICD-10-CM | POA: Diagnosis not present

## 2017-07-31 NOTE — Progress Notes (Signed)
[facility] day:  08/01/2017  Chief Complaint: Mary Todd is a 71 y.o. female with stage II Hodgkin's disease (2004) and stage IA right breast cancer (2011) who is seen for 6 month assessment.  HPI: The patient was last seen in the medical oncology clinic on 01/24/2017.  At that time, she noted chronic pain in her neck and her knees. She had just returned from 14 day trip to [place]. She had 3 month history of night sweats. CBC and CMP were unremarkable. CA27.29 was normal at 13.4. Patient continued on Arimidex 1 mg daily. She was scheduled to have follow-up mammogram in 05/2017. Additionally, she was scheduled to have follow-up thyroid ultrasound in 03/2017.  She received Prolia on 01/24/2017.  Thyroid ultrasound on 04/13/2007 revealed a 2.3 cm solid and cystic nodule within the left thyroid gland. There was also a small nodule within the right thyroid gland that did not meet size criteria for measurement. There was a single benign appearing lymph node within the right neck. FNA was recommended if not previously done with patient's biopsy.  [place] clinic records indicate that FNA in 06/2016 was "indeterminate". FNA was subsequently repeated in 08/2016 and was "benign".  She had follow-up after the thyroid ultrasound by Dr. Gabriel Carina on 04/20/2017.  No repeat biopsy was felt necessary.  Follow-up ultrasound in 1 year was discussed.  Diagnostic mammogram done on 06/21/2017 revealed stable postsurgical changes in the right breast. There was no evidence of malignancy. Recommendations were to repeat a bilateral screening mammogram in 1 year.  Symptomatically, patient doing well. She complaints of lower back pain in the mornings for the last 4 - 5 weeks. She denies heavy living or trauma. No urinary symptoms. Pain improved after taking a hot shower. Pain rated 0/10 today in the clinic. She does not have any B symptoms. Patient eating well. She has lost 3  pounds.    Past Medical History:  Diagnosis Date  . Breast cancer ([place]) right   2011, T1A N0, radiation  . Esophageal reflux   . Hodgkin's disease ([place]) 2004  . Hypertension 2006  . Personal history of malignant neoplasm of breast 2011   right breast    Past Surgical History:  Procedure Laterality Date  . ABDOMINAL HYSTERECTOMY    . BACK SURGERY    . BREAST BIOPSY Right 2011   radiation  . BREAST SURGERY Right 2011   right breast lumpectomy,L/SN/R for right breast CA   . COLONOSCOPY  2003  . PORT A CATH REVISION    . WRIST SURGERY  2005    Family History  Problem Relation Age of Onset  . Cancer Father   . Stroke Brother   . Breast cancer Mother 62    Social History:  reports that she has never smoked. She has quit using smokeless tobacco. She reports that she drinks alcohol. She reports that she does not use drugs.  She is retired.  She previously worked for [employer].  She went on a 14-day trip to [place] in March 2018 with a group of 20 acquaintances.  She is alone today.  Allergies:  Allergies  Allergen Reactions  . Augmentin [Amoxicillin-Pot Clavulanate] Nausea Only    Current Medications: Current Outpatient Prescriptions  Medication Sig Dispense Refill  . acetaminophen (TYLENOL) 650 MG CR tablet Take 1,300 mg by mouth every 8 (eight) hours as needed for pain.    Marland Kitchen amLODipine (NORVASC) 5 MG tablet Take 1  tablet (5 mg total) by mouth 1 day or 1 dose. 90 tablet 1  . anastrozole (ARIMIDEX) 1 MG tablet TAKE ONE TABLET BY MOUTH ONCE DAILY 90 tablet 3  . aspirin 81 MG tablet Take 81 mg by mouth daily.    Marland Kitchen CALCIUM-MAGNESIUM PO Take 1 tablet by mouth daily.    . cetirizine (ZYRTEC) 10 MG tablet Take 10 mg by mouth daily.    . Cholecalciferol (VITAMIN D-3 PO) Take 1 tablet by mouth daily.    Jerrye Beavers Polysacch (ALCORTIN EX) Apply topically 2 (two) times daily as needed.    . meloxicam (MOBIC) 15 MG tablet Take 1 tablet (15 mg total) by  mouth daily. 90 tablet 1  . metFORMIN (GLUCOPHAGE-XR) 750 MG 24 hr tablet Take 1 tablet (750 mg total) by mouth daily with breakfast. 90 tablet 1  . metoprolol succinate (TOPROL-XL) 50 MG 24 hr tablet Take 1 tablet (50 mg total) by mouth daily. 90 tablet 1  . Multiple Vitamin (MULTIVITAMIN) tablet Take 1 tablet by mouth daily.    Marland Kitchen nystatin (MYCOSTATIN/NYSTOP) powder Apply topically 2 (two) times daily.    . pantoprazole (PROTONIX) 40 MG tablet Take 1 tablet (40 mg total) by mouth daily. 90 tablet 1  . phentermine 15 MG capsule Take 15 mg by mouth every morning.    . ranitidine (ZANTAC) 150 MG tablet Take 1 tablet (150 mg total) by mouth 2 (two) times daily. Alternate with pantoprazole 180 tablet 0   No current facility-administered medications for this visit.     Review of Systems:  GENERAL:  Feels well.  No fevers or sweats. Weight down 3 pounds. PERFORMANCE STATUS (ECOG):  1 HEENT:  No visual changes, runny nose, sore throat, mouth sores or tenderness. Lungs: No shortness of breath or cough.  No hemoptysis. Cardiac:  No chest pain, palpitations, orthopnea, or PND. GI:  No nausea, vomiting, diarrhea, constipation, melena or hematochezia.  Colonoscopy on 06/26/2012. GU:  No urgency, frequency, dysuria, or hematuria. Musculoskeletal:  Back pain (see4 HPI).  Ruptured disk x 1 injection.  Arthritis in knee x 3 injections.  No muscle tenderness. Extremities:  No pain or swelling. Skin:  No rashes or skin changes. Neuro:  No headache, numbness or weakness, balance or coordination issues. Endocrine:  Nodules on thyroid (benign).  Night sweats.  No diabetes or hot flashes.  Psych:  No mood changes, depression or anxiety. Pain:  Low back pain. Review of systems:  All other systems reviewed and found to be negative.  Physical Exam: Blood pressure 133/90, pulse 90, temperature (!) 96.7 F (35.9 C), temperature source Tympanic, resp. rate 18, weight 212 lb 8.4 oz (96.4 kg). GENERAL:  Well  developed, well nourished, woman sitting comfortably in the exam room in no acute distress. MENTAL STATUS:  Alert and oriented to person, place and time. HEAD:  Short light brown hair.  Normocephalic, atraumatic, face symmetric, no Cushingoid features. EYES:  Glasses.  Brown eyes.  Pupils equal round and reactive to light and accomodation.  No conjunctivitis or scleral icterus. ENT:  Oropharynx clear without lesion.  Tongue normal. Mucous membranes moist.  RESPIRATORY:  Clear to auscultation without rales, wheezes or rhonchi. CARDIOVASCULAR:  Regular rate and rhythm without murmur, rub or gallop. BREAST:  Large breasts.  Right breast with 3 cm area of scarring at the 10 o'clock position (chronic).  No masses, skin changes or nipple discharge.  Left breast with fibrocystic changes upper quadrants.  No masses, skin changes or nipple discharge.  ABDOMEN:  Soft, non-tender, with active bowel sounds, and no appreciable hepatosplenomegaly.  No masses. Back:  Slightly tender to palpation lumbar/sacral area. SKIN:  No rashes, ulcers or lesions. EXTREMITIES: No edema, no skin discoloration or tenderness.  No palpable cords. LYMPH NODES: No palpable cervical, supraclavicular, axillary or inguinal adenopathy  NEUROLOGICAL: Unremarkable. PSYCH:  Appropriate.   Orders Only on 08/01/2017  Component Date Value Ref Range Status  . WBC 08/01/2017 6.8  3.6 - 11.0 K/uL Final  . RBC 08/01/2017 4.43  3.80 - 5.20 MIL/uL Final  . Hemoglobin 08/01/2017 13.7  12.0 - 16.0 g/dL Final  . HCT 08/01/2017 40.1  35.0 - 47.0 % Final  . MCV 08/01/2017 90.5  80.0 - 100.0 fL Final  . MCH 08/01/2017 30.9  26.0 - 34.0 pg Final  . MCHC 08/01/2017 34.2  32.0 - 36.0 g/dL Final  . RDW 08/01/2017 13.5  11.5 - 14.5 % Final  . Platelets 08/01/2017 204  150 - 440 K/uL Final  . Neutrophils Relative % 08/01/2017 53  % Final  . Neutro Abs 08/01/2017 3.7  1.4 - 6.5 K/uL Final  . Lymphocytes Relative 08/01/2017 33  % Final  . Lymphs  Abs 08/01/2017 2.2  1.0 - 3.6 K/uL Final  . Monocytes Relative 08/01/2017 9  % Final  . Monocytes Absolute 08/01/2017 0.6  0.2 - 0.9 K/uL Final  . Eosinophils Relative 08/01/2017 4  % Final  . Eosinophils Absolute 08/01/2017 0.3  0 - 0.7 K/uL Final  . Basophils Relative 08/01/2017 1  % Final  . Basophils Absolute 08/01/2017 0.1  0 - 0.1 K/uL Final  . Sodium 08/01/2017 135  135 - 145 mmol/L Final  . Potassium 08/01/2017 4.1  3.5 - 5.1 mmol/L Final  . Chloride 08/01/2017 100* 101 - 111 mmol/L Final  . CO2 08/01/2017 27  22 - 32 mmol/L Final  . Glucose, Bld 08/01/2017 103* 65 - 99 mg/dL Final  . BUN 08/01/2017 16  6 - 20 mg/dL Final  . Creatinine, Ser 08/01/2017 0.63  0.44 - 1.00 mg/dL Final  . Calcium 08/01/2017 8.9  8.9 - 10.3 mg/dL Final  . Total Protein 08/01/2017 7.8  6.5 - 8.1 g/dL Final  . Albumin 08/01/2017 4.1  3.5 - 5.0 g/dL Final  . AST 08/01/2017 21  15 - 41 U/L Final  . ALT 08/01/2017 22  14 - 54 U/L Final  . Alkaline Phosphatase 08/01/2017 80  38 - 126 U/L Final  . Total Bilirubin 08/01/2017 0.7  0.3 - 1.2 mg/dL Final  . GFR calc non Af Amer 08/01/2017 >60  >60 mL/min Final  . GFR calc Af Amer 08/01/2017 >60  >60 mL/min Final   Comment: (NOTE) The eGFR has been calculated using the CKD EPI equation. This calculation has not been validated in all clinical situations. eGFR's persistently <60 mL/min signify possible Chronic Kidney Disease.   . Anion gap 08/01/2017 8  5 - 15 Final    Assessment:  Mary Todd is a 71 y.o. female with stage II Hodgkin's disease (2004) and stage IA right breast cancer (2011).  She was diagnosed with stage IIA Hodgkin's disease after presenting with low counts and joint pain.  Abdomen and pelvic CT scan as well as PET scan revealed a pelvic mass and retroperitoneal lymphadenopathy.  Biopsy confirmed Hodgkin's disease.    She received ABVD times 5 through cycle 3-A and then AVD without the Bleomycin times 7 subsequent cycles.   Treatment completed in 12/2003.  CT and PET scans were negative in 05/2006 and in 06/2006.  She underwent right breast lumpectomy and sentinel lymph node biopsy on 07/21/2010.  Pathology revealed a 0.5 cm grade II invasive ductal carcinoma.  Two sentinel lymph nodes were negative.  Tumor was ER + (>90%), PR + (40%), and Her2/neu 1+.  Pathologic stage was T1aN0Mx.  She received Mammosite radiation.  Oncotype DX testing revealed a low risk lesion.  BRCA1/2 testing was negative.  She received 5 years of an aromatase inhibitor.  Breast cancer index (BCI) testing revealed a 6.3% (CI: 2.9%-9.6%) risk of late recurrent disease (years 5-10) after 5 years of hormonal therapy and a high likelihood of benefit from continued hormonal therapy (30% reduction).  Decision was made to continue Arimidex for 5 more years.  Bilateral mammogram on 06/20/2016 and 06/21/2017  revealed no evidence of recurrent disease (ordered by Dr. Jamal Collin).  CA27.29 has been followed:  8.4 on 07/19/2016 and 13.4 on 01/24/2017.  Thyroid ultrasound  on 04/13/2007 revealed a 2.3 cm solid and cystic nodule within the left thyroid gland. There was also a small nodule within the right thyroid gland that did not meet size criteria for measurement. There was a single benign appearing lymph node within the right neck.  FNA in 06/2016 was  "indeterminate" and in 08/2016 was "benign".    Bone density study on 09/05/2011 revealed osteopenia with a  T-score of -1.7 in L1-L4.  Bone density study on 11/14/2012 revealed osteopenia with a  T-score of -1.5 in L1-L4.  Bone density study on 12/14/2016 revealed osteoporosis in L2-3 with a T-score of -3.1.  Left femur normal with T-score of -0.8.  She began Prolia on 01/24/2017.  Symptomatically, she has a 4-5 week history of lower back pain.  She denies any B symptoms.  She denies any breast concerns.  Exam reveals fibrocystic and post radiation changes to her RIGHT breast. Fibrocystic changes also noted  contralaterally. Labs are unremarkable.     Plan: 1.  Labs today:  CBC with differential, CMP, CA 27.29. 2.  Review mammogram - no evidence of malignancy. 3.  Review thyroid ultrasound -  2.3 cm solid and cystic nodule within the left thyroid gland. Small nodule in the right thyroid gland.  4.  Prolia injection today.  5.  Continue Arimidex 1 mg daily. 6.  Continue calcium 1200 mg and vitamin D 800 IU daily.    7.  Schedule plain films of the lumbar spine to assess persistent back pain.  8.  RTC in 6 months for MD assess, labs (CBC with diff, CMP, CA27.29), and Prolia.   Honor Loh, NP 08/01/2017, 9:26 AM   I saw and evaluated the patient, participating in the key portions of the service and reviewing pertinent diagnostic studies and records.  I reviewed the nurse practitioner's note and agree with the findings and the plan.  The assessment and plan were discussed with the patient.  A few questions were asked by the patient and answered.   Nolon Stalls, MD 08/01/2017,9:26 AM

## 2017-08-01 ENCOUNTER — Ambulatory Visit
Admission: RE | Admit: 2017-08-01 | Discharge: 2017-08-01 | Disposition: A | Discharge status: Home / Self Care | Payer: Medicare Other | Source: Ambulatory Visit | Attending: *Deleted | Admitting: *Deleted

## 2017-08-01 ENCOUNTER — Inpatient Hospital Stay: Payer: Medicare Other

## 2017-08-01 ENCOUNTER — Ambulatory Visit
Admission: RE | Admit: 2017-08-01 | Discharge: 2017-08-01 | Disposition: A | Discharge status: Home / Self Care | Payer: Medicare Other | Source: Ambulatory Visit | Attending: Urgent Care | Admitting: Urgent Care

## 2017-08-01 ENCOUNTER — Inpatient Hospital Stay: Payer: Medicare Other | Attending: Hematology and Oncology | Admitting: Hematology and Oncology

## 2017-08-01 ENCOUNTER — Other Ambulatory Visit: Payer: Self-pay

## 2017-08-01 ENCOUNTER — Other Ambulatory Visit: Payer: Self-pay | Admitting: *Deleted

## 2017-08-01 ENCOUNTER — Encounter: Payer: Self-pay | Admitting: Hematology and Oncology

## 2017-08-01 VITALS — BP 133/90 | HR 90 | Temp 96.7°F | Resp 18 | Wt 212.5 lb

## 2017-08-01 DIAGNOSIS — Z79811 Long term (current) use of aromatase inhibitors: Secondary | ICD-10-CM

## 2017-08-01 DIAGNOSIS — M545 Low back pain, unspecified: Secondary | ICD-10-CM

## 2017-08-01 DIAGNOSIS — C50911 Malignant neoplasm of unspecified site of right female breast: Secondary | ICD-10-CM | POA: Diagnosis not present

## 2017-08-01 DIAGNOSIS — C8193 Hodgkin lymphoma, unspecified, intra-abdominal lymph nodes: Secondary | ICD-10-CM

## 2017-08-01 DIAGNOSIS — Z7982 Long term (current) use of aspirin: Secondary | ICD-10-CM | POA: Insufficient documentation

## 2017-08-01 DIAGNOSIS — Z853 Personal history of malignant neoplasm of breast: Secondary | ICD-10-CM | POA: Diagnosis not present

## 2017-08-01 DIAGNOSIS — I10 Essential (primary) hypertension: Secondary | ICD-10-CM | POA: Diagnosis not present

## 2017-08-01 DIAGNOSIS — K219 Gastro-esophageal reflux disease without esophagitis: Secondary | ICD-10-CM | POA: Insufficient documentation

## 2017-08-01 DIAGNOSIS — M1288 Other specific arthropathies, not elsewhere classified, other specified site: Secondary | ICD-10-CM | POA: Insufficient documentation

## 2017-08-01 DIAGNOSIS — Z9221 Personal history of antineoplastic chemotherapy: Secondary | ICD-10-CM

## 2017-08-01 DIAGNOSIS — R61 Generalized hyperhidrosis: Secondary | ICD-10-CM | POA: Insufficient documentation

## 2017-08-01 DIAGNOSIS — R29890 Loss of height: Secondary | ICD-10-CM | POA: Diagnosis not present

## 2017-08-01 DIAGNOSIS — Z79899 Other long term (current) drug therapy: Secondary | ICD-10-CM | POA: Diagnosis not present

## 2017-08-01 DIAGNOSIS — M810 Age-related osteoporosis without current pathological fracture: Secondary | ICD-10-CM

## 2017-08-01 DIAGNOSIS — M5135 Other intervertebral disc degeneration, thoracolumbar region: Secondary | ICD-10-CM | POA: Insufficient documentation

## 2017-08-01 DIAGNOSIS — Z8571 Personal history of Hodgkin lymphoma: Secondary | ICD-10-CM | POA: Insufficient documentation

## 2017-08-01 DIAGNOSIS — Z170 Estrogen receptor positive status [ER+]: Secondary | ICD-10-CM | POA: Insufficient documentation

## 2017-08-01 DIAGNOSIS — M5136 Other intervertebral disc degeneration, lumbar region: Secondary | ICD-10-CM | POA: Diagnosis not present

## 2017-08-01 DIAGNOSIS — M816 Localized osteoporosis [Lequesne]: Secondary | ICD-10-CM

## 2017-08-01 DIAGNOSIS — Z803 Family history of malignant neoplasm of breast: Secondary | ICD-10-CM | POA: Diagnosis not present

## 2017-08-01 DIAGNOSIS — Z923 Personal history of irradiation: Secondary | ICD-10-CM | POA: Diagnosis not present

## 2017-08-01 LAB — COMPREHENSIVE METABOLIC PANEL
ALT: 22 U/L (ref 14–54)
AST: 21 U/L (ref 15–41)
Albumin: 4.1 g/dL (ref 3.5–5.0)
Alkaline Phosphatase: 80 U/L (ref 38–126)
Anion gap: 8 (ref 5–15)
BUN: 16 mg/dL (ref 6–20)
CO2: 27 mmol/L (ref 22–32)
Calcium: 8.9 mg/dL (ref 8.9–10.3)
Chloride: 100 mmol/L — ABNORMAL LOW (ref 101–111)
Creatinine, Ser: 0.63 mg/dL (ref 0.44–1.00)
GFR calc Af Amer: >60 mL/min (ref >60)
GFR calc non Af Amer: >60 mL/min (ref >60)
Glucose, Bld: 103 mg/dL — ABNORMAL HIGH (ref 65–99)
Potassium: 4.1 mmol/L (ref 3.5–5.1)
Sodium: 135 mmol/L (ref 135–145)
Total Bilirubin: 0.7 mg/dL (ref 0.3–1.2)
Total Protein: 7.8 g/dL (ref 6.5–8.1)

## 2017-08-01 LAB — CBC WITH DIFFERENTIAL/PLATELET
Basophils Absolute: 0.1 10*3/uL (ref 0–0.1)
Basophils Relative: 1 %
Eosinophils Absolute: 0.3 10*3/uL (ref 0–0.7)
Eosinophils Relative: 4 %
HCT: 40.1 % (ref 35.0–47.0)
Hemoglobin: 13.7 g/dL (ref 12.0–16.0)
Lymphocytes Relative: 33 %
Lymphs Abs: 2.2 10*3/uL (ref 1.0–3.6)
MCH: 30.9 pg (ref 26.0–34.0)
MCHC: 34.2 g/dL (ref 32.0–36.0)
MCV: 90.5 fL (ref 80.0–100.0)
Monocytes Absolute: 0.6 10*3/uL (ref 0.2–0.9)
Monocytes Relative: 9 %
Neutro Abs: 3.7 10*3/uL (ref 1.4–6.5)
Neutrophils Relative %: 53 %
Platelets: 204 10*3/uL (ref 150–440)
RBC: 4.43 MIL/uL (ref 3.80–5.20)
RDW: 13.5 % (ref 11.5–14.5)
WBC: 6.8 10*3/uL (ref 3.6–11.0)

## 2017-08-01 MED ORDER — DENOSUMAB 60 MG/ML ~~LOC~~ SOLN
60.0000 mg | Freq: Once | SUBCUTANEOUS | Status: AC
  Administered 2017-08-01: 60 mg via SUBCUTANEOUS

## 2017-08-01 NOTE — Patient Instructions (Signed)
Cyanocobalamin, Vitamin B12 injection What is this medicine? CYANOCOBALAMIN (sye an oh koe BAL a min) is a man made form of vitamin B12. Vitamin B12 is used in the growth of healthy blood cells, nerve cells, and proteins in the body. It also helps with the metabolism of fats and carbohydrates. This medicine is used to treat people who can not absorb vitamin B12. This medicine may be used for other purposes; ask your health care provider or pharmacist if you have questions. COMMON BRAND NAME(S): B-12 Compliance Kit, B-12 Injection Kit, Cyomin, LA-12, Nutri-Twelve, Physicians EZ Use B-12, Primabalt What should I tell my health care provider before I take this medicine? They need to know if you have any of these conditions: -kidney disease -Leber's disease -megaloblastic anemia -an unusual or allergic reaction to cyanocobalamin, cobalt, other medicines, foods, dyes, or preservatives -pregnant or trying to get pregnant -breast-feeding How should I use this medicine? This medicine is injected into a muscle or deeply under the skin. It is usually given by a health care professional in a clinic or doctor's office. However, your doctor may teach you how to inject yourself. Follow all instructions. Talk to your pediatrician regarding the use of this medicine in children. Special care may be needed. Overdosage: If you think you have taken too much of this medicine contact a poison control center or emergency room at once. NOTE: This medicine is only for you. Do not share this medicine with others. What if I miss a dose? If you are given your dose at a clinic or doctor's office, call to reschedule your appointment. If you give your own injections and you miss a dose, take it as soon as you can. If it is almost time for your next dose, take only that dose. Do not take double or extra doses. What may interact with this medicine? -colchicine -heavy alcohol intake This list may not describe all possible  interactions. Give your health care provider a list of all the medicines, herbs, non-prescription drugs, or dietary supplements you use. Also tell them if you smoke, drink alcohol, or use illegal drugs. Some items may interact with your medicine. What should I watch for while using this medicine? Visit your doctor or health care professional regularly. You may need blood work done while you are taking this medicine. You may need to follow a special diet. Talk to your doctor. Limit your alcohol intake and avoid smoking to get the best benefit. What side effects may I notice from receiving this medicine? Side effects that you should report to your doctor or health care professional as soon as possible: -allergic reactions like skin rash, itching or hives, swelling of the face, lips, or tongue -blue tint to skin -chest tightness, pain -difficulty breathing, wheezing -dizziness -red, swollen painful area on the leg Side effects that usually do not require medical attention (report to your doctor or health care professional if they continue or are bothersome): -diarrhea -headache This list may not describe all possible side effects. Call your doctor for medical advice about side effects. You may report side effects to FDA at 1-800-FDA-1088. Where should I keep my medicine? Keep out of the reach of children. Store at room temperature between 15 and 30 degrees C (59 and 85 degrees F). Protect from light. Throw away any unused medicine after the expiration date. NOTE: This sheet is a summary. It may not cover all possible information. If you have questions about this medicine, talk to your doctor, pharmacist, or   health care provider.  2018 Elsevier/Gold Standard (2008-01-27 22:10:20)  

## 2017-08-01 NOTE — Progress Notes (Signed)
Patient states she is having lower back pain, as well as left jaw discomfort.  States she notices when she opens her mouth when eating she is unable to open her mouth as wide.  Sometimes the jaw feels sore then resolves.

## 2017-08-02 LAB — CANCER ANTIGEN 27.29: CA 27.29: 7.1 U/mL (ref 0.0–38.6)

## 2017-08-06 ENCOUNTER — Ambulatory Visit (INDEPENDENT_AMBULATORY_CARE_PROVIDER_SITE_OTHER): Payer: Medicare Other | Admitting: Family Medicine

## 2017-08-06 ENCOUNTER — Encounter: Payer: Self-pay | Admitting: Family Medicine

## 2017-08-06 VITALS — BP 134/72 | HR 94 | Temp 98.0°F | Resp 16 | Ht 67.0 in | Wt 212.6 lb

## 2017-08-06 DIAGNOSIS — M545 Low back pain, unspecified: Secondary | ICD-10-CM

## 2017-08-06 DIAGNOSIS — M5137 Other intervertebral disc degeneration, lumbosacral region: Secondary | ICD-10-CM | POA: Diagnosis not present

## 2017-08-06 DIAGNOSIS — M51379 Other intervertebral disc degeneration, lumbosacral region without mention of lumbar back pain or lower extremity pain: Secondary | ICD-10-CM | POA: Insufficient documentation

## 2017-08-06 DIAGNOSIS — M4310 Spondylolisthesis, site unspecified: Secondary | ICD-10-CM | POA: Diagnosis not present

## 2017-08-06 NOTE — Patient Instructions (Signed)

## 2017-08-06 NOTE — Progress Notes (Signed)
Name: Mary Todd   MRN: [id number]    DOB: [birth year]   Date:08/06/2017       Progress Note  Subjective  Chief Complaint  Chief Complaint  Patient presents with  . Results    Lumbar X-Ray    HPI  Chronic low back pain; she has a history of lumbar spine surgery 1991, pain has been continues since, however over the past getting progressively worse, no radiation, usually around l5 level, aching like, worse in am, gets better with movement and Tylenol. Pain is usually 8/10 in the mornings, pain is 3-4 throughout the day. No weakness, bowel or bladder incontinence. Spoke to Dr. Mike Gip on her last visit and x-ray lumbar spine was ordered that showed DDD, spondylolisthesis.    Patient Active Problem List   Diagnosis Date Noted  . Anterolisthesis 08/06/2017  . DDD (degenerative disc disease), lumbosacral 08/06/2017  . Chondromalacia patellae 07/03/2017  . Osteoporosis 12/14/2016  . Morbid obesity ([place]) 07/17/2016  . Spinal stenosis in cervical region 05/22/2016  . Thyroid nodule 05/22/2016  . GERD without esophagitis 03/16/2016  . Neck muscle spasm 03/16/2016  . History of back surgery 03/16/2016  . Osteoarthritis of both knees 03/16/2016  . Large breasts 03/16/2016  . Hypertension, benign 03/16/2016  . Perennial allergic rhinitis with seasonal variation 03/16/2016  . History of pneumococcal pneumonia 09/20/2015  . Hodgkin disease ([place]) 07/15/2014  . Breast cancer, right ([place]) 06/26/2013    Past Surgical History:  Procedure Laterality Date  . ABDOMINAL HYSTERECTOMY    . BACK SURGERY    . BREAST BIOPSY Right 2011   radiation  . BREAST SURGERY Right 2011   right breast lumpectomy,L/SN/R for right breast CA   . COLONOSCOPY  2003  . PORT A CATH REVISION    . WRIST SURGERY  2005    Family History  Problem Relation Age of Onset  . Cancer Father   . Stroke Brother   . Breast cancer Mother 25    Social History   Social History  . Marital status: Married   Spouse name: N/A  . Number of children: N/A  . Years of education: N/A   Occupational History  . Not on file.   Social History Main Topics  . Smoking status: Never Smoker  . Smokeless tobacco: Never Used  . Alcohol use 0.0 oz/week     Comment: occassionally  . Drug use: No  . Sexual activity: Not Currently   Other Topics Concern  . Not on file   Social History Narrative  . No narrative on file     Current Outpatient Prescriptions:  .  acetaminophen (TYLENOL) 650 MG CR tablet, Take 1,300 mg by mouth every 8 (eight) hours as needed for pain., Disp: , Rfl:  .  amLODipine (NORVASC) 5 MG tablet, Take 1 tablet (5 mg total) by mouth 1 day or 1 dose., Disp: 90 tablet, Rfl: 1 .  anastrozole (ARIMIDEX) 1 MG tablet, TAKE ONE TABLET BY MOUTH ONCE DAILY, Disp: 90 tablet, Rfl: 3 .  aspirin 81 MG tablet, Take 81 mg by mouth daily., Disp: , Rfl:  .  CALCIUM-MAGNESIUM PO, Take 1 tablet by mouth daily., Disp: , Rfl:  .  cetirizine (ZYRTEC) 10 MG tablet, Take 10 mg by mouth daily., Disp: , Rfl:  .  Cholecalciferol (VITAMIN D-3 PO), Take 1 tablet by mouth daily., Disp: , Rfl:  .  Iodoquinol-HC-Aloe Polysacch (ALCORTIN EX), Apply topically 2 (two) times daily as needed., Disp: , Rfl:  .  meloxicam (MOBIC) 15 MG tablet, Take 1 tablet (15 mg total) by mouth daily., Disp: 90 tablet, Rfl: 1 .  metFORMIN (GLUCOPHAGE-XR) 750 MG 24 hr tablet, Take 1 tablet (750 mg total) by mouth daily with breakfast., Disp: 90 tablet, Rfl: 1 .  metoprolol succinate (TOPROL-XL) 50 MG 24 hr tablet, Take 1 tablet (50 mg total) by mouth daily., Disp: 90 tablet, Rfl: 1 .  Multiple Vitamin (MULTIVITAMIN) tablet, Take 1 tablet by mouth daily., Disp: , Rfl:  .  nystatin (MYCOSTATIN/NYSTOP) powder, Apply topically 2 (two) times daily., Disp: , Rfl:  .  pantoprazole (PROTONIX) 40 MG tablet, Take 1 tablet (40 mg total) by mouth daily., Disp: 90 tablet, Rfl: 1 .  phentermine 15 MG capsule, Take 15 mg by mouth every morning., Disp: ,  Rfl:  .  ranitidine (ZANTAC) 150 MG tablet, Take 1 tablet (150 mg total) by mouth 2 (two) times daily. Alternate with pantoprazole, Disp: 180 tablet, Rfl: 0  Allergies  Allergen Reactions  . Augmentin [Amoxicillin-Pot Clavulanate] Nausea Only     ROS  Ten systems reviewed and is negative except as mentioned in HPI   Objective  Vitals:   08/06/17 1033  BP: 134/72  Pulse: 94  Resp: 16  Temp: 98 F (36.7 C)  TempSrc: Oral  SpO2: 96%  Weight: 212 lb 9.6 oz (96.4 kg)  Height: '5\' 7"'$  (1.702 m)    Body mass index is 33.3 kg/m.  Physical Exam  Constitutional: Patient appears well-developed and well-nourished. Obese No distress.  HEENT: head atraumatic, normocephalic, pupils equal and reactive to light,  neck supple, throat within normal limits Cardiovascular: Normal rate, regular rhythm and normal heart sounds.  No murmur heard. No BLE edema. Pulmonary/Chest: Effort normal and breath sounds normal. No respiratory distress. Abdominal: Soft.  There is no tenderness. Psychiatric: Patient has a normal mood and affect. behavior is normal. Judgment and thought content normal. Muscular Skeletal: pain with palpation of lumbar spine, normal lateral bending and flexion, some pain with extension, negative straight leg raise, crepitus with extension of right knee  Recent Results (from the past 2160 hour(s))  POCT glycosylated hemoglobin (Hb A1C)     Status: None   Collection Time: 07/03/17 11:03 AM  Result Value Ref Range   Hemoglobin A1C 5.4   CBC with Differential/Platelet     Status: None   Collection Time: 08/01/17  8:37 AM  Result Value Ref Range   WBC 6.8 3.6 - 11.0 K/uL   RBC 4.43 3.80 - 5.20 MIL/uL   Hemoglobin 13.7 12.0 - 16.0 g/dL   HCT 40.1 35.0 - 47.0 %   MCV 90.5 80.0 - 100.0 fL   MCH 30.9 26.0 - 34.0 pg   MCHC 34.2 32.0 - 36.0 g/dL   RDW 13.5 11.5 - 14.5 %   Platelets 204 150 - 440 K/uL   Neutrophils Relative % 53 %   Neutro Abs 3.7 1.4 - 6.5 K/uL   Lymphocytes  Relative 33 %   Lymphs Abs 2.2 1.0 - 3.6 K/uL   Monocytes Relative 9 %   Monocytes Absolute 0.6 0.2 - 0.9 K/uL   Eosinophils Relative 4 %   Eosinophils Absolute 0.3 0 - 0.7 K/uL   Basophils Relative 1 %   Basophils Absolute 0.1 0 - 0.1 K/uL  Cancer antigen 27.29     Status: None   Collection Time: 08/01/17  8:37 AM  Result Value Ref Range   CA 27.29 7.1 0.0 - 38.6 U/mL    Comment: (  NOTE) Research scientist (life sciences) Performed At: [facility] [place], [place] [id number] Lindon Romp MD XI:[id number]   Comprehensive metabolic panel     Status: Abnormal   Collection Time: 08/01/17  8:37 AM  Result Value Ref Range   Sodium 135 135 - 145 mmol/L   Potassium 4.1 3.5 - 5.1 mmol/L   Chloride 100 (L) 101 - 111 mmol/L   CO2 27 22 - 32 mmol/L   Glucose, Bld 103 (H) 65 - 99 mg/dL   BUN 16 6 - 20 mg/dL   Creatinine, Ser 0.63 0.44 - 1.00 mg/dL   Calcium 8.9 8.9 - 10.3 mg/dL   Total Protein 7.8 6.5 - 8.1 g/dL   Albumin 4.1 3.5 - 5.0 g/dL   AST 21 15 - 41 U/L   ALT 22 14 - 54 U/L   Alkaline Phosphatase 80 38 - 126 U/L   Total Bilirubin 0.7 0.3 - 1.2 mg/dL   GFR calc non Af Amer >60 >60 mL/min   GFR calc Af Amer >60 >60 mL/min    Comment: (NOTE) The eGFR has been calculated using the CKD EPI equation. This calculation has not been validated in all clinical situations. eGFR's persistently <60 mL/min signify possible Chronic Kidney Disease.    Anion gap 8 5 - 15     PHQ2/9: Depression screen [facility] 2/9 07/03/2017 10/31/2016 07/17/2016 03/16/2016 12/17/2015  Decreased Interest 0 0 0 0 0  Down, Depressed, Hopeless 0 0 0 0 0  PHQ - 2 Score 0 0 0 0 0     Fall Risk: Fall Risk  07/03/2017 10/31/2016 07/17/2016 03/16/2016 12/17/2015  Falls in the past year? No No No Yes No  Number falls in past yr: - - - 2 or more -  Injury with Fall? - - - No -     Assessment & Plan  1. Anterolisthesis  - Ambulatory referral to Physical Therapy Try tylenol at bed time  2. DDD  (degenerative disc disease), lumbosacral  - Ambulatory referral to Physical Therapy  3. Back pain at L4-L5 level  - Ambulatory referral to Physical Therapy

## 2017-08-06 NOTE — Addendum Note (Signed)
Addended by: Chilton Greathouse on: 08/06/2017 11:29 AM   Modules accepted: Orders

## 2017-08-30 ENCOUNTER — Other Ambulatory Visit: Payer: Self-pay | Admitting: Hematology and Oncology

## 2017-08-30 DIAGNOSIS — Z853 Personal history of malignant neoplasm of breast: Secondary | ICD-10-CM

## 2017-08-31 DIAGNOSIS — M545 Low back pain: Secondary | ICD-10-CM | POA: Diagnosis not present

## 2017-09-07 ENCOUNTER — Ambulatory Visit: Payer: Medicare Other

## 2017-10-31 DIAGNOSIS — L304 Erythema intertrigo: Secondary | ICD-10-CM | POA: Diagnosis not present

## 2017-10-31 DIAGNOSIS — L282 Other prurigo: Secondary | ICD-10-CM | POA: Diagnosis not present

## 2017-11-05 DIAGNOSIS — E042 Nontoxic multinodular goiter: Secondary | ICD-10-CM | POA: Diagnosis not present

## 2017-11-05 DIAGNOSIS — E669 Obesity, unspecified: Secondary | ICD-10-CM | POA: Diagnosis not present

## 2017-12-21 ENCOUNTER — Telehealth: Payer: Self-pay

## 2017-12-21 NOTE — Telephone Encounter (Signed)
Called pt to sched AWV w/ [facility]. Pt previously canceled AWV on 09/07/17 but did not reschedule. Pt also has an appt scheduled on 12/31/17 which appears to be a f/u to the AWV from 09/07/17. AWV has now been rescheduled for 12/25/17.

## 2017-12-25 ENCOUNTER — Ambulatory Visit (INDEPENDENT_AMBULATORY_CARE_PROVIDER_SITE_OTHER): Payer: Medicare Other

## 2017-12-25 VITALS — BP 126/78 | HR 82 | Temp 98.0°F | Resp 12 | Ht 67.0 in | Wt 209.0 lb

## 2017-12-25 DIAGNOSIS — Z0000 Encounter for general adult medical examination without abnormal findings: Secondary | ICD-10-CM | POA: Diagnosis not present

## 2017-12-25 NOTE — Progress Notes (Addendum)
Subjective:   Mary Todd is a 72 y.o. female who presents for Medicare Annual (Subsequent) preventive examination.  Review of Systems:  N/A Cardiac Risk Factors include: advanced age (>47men, >10 women);hypertension;sedentary lifestyle;obesity (BMI >30kg/m2)     Objective:     Vitals: BP 126/78 (BP Location: Left Arm, Patient Position: Sitting, Cuff Size: Large)   Pulse 82   Temp 98 F (36.7 C) (Oral)   Resp 12   Ht 5\' 7"  (1.702 m)   Wt 209 lb (94.8 kg)   BMI 32.73 kg/m   Body mass index is 32.73 kg/m.  Advanced Directives 12/25/2017 08/01/2017 07/03/2017 02/28/2017 01/24/2017 10/31/2016 07/19/2016  Does Patient Have a Medical Advance Directive? No No No No No No No  Would patient like information on creating a medical advance directive? Yes (MAU/Ambulatory/Procedural Areas - Information given) - - - - - -    Tobacco Social History   Tobacco Use  Smoking Status Never Smoker  Smokeless Tobacco Never Used  Tobacco Comment   smoking cessation materials not required     Counseling given: No Comment: smoking cessation materials not required   Clinical Intake:  Pre-visit preparation completed: Yes  Pain : No/denies pain   BMI - recorded: 32.73 Nutritional Risks: None Diabetes: Yes(Prediabetic; takes Metformin) CBG done?: No Did pt. bring in CBG monitor from home?: No  How often do you need to have someone help you when you read instructions, pamphlets, or other written materials from your doctor or pharmacy?: 1 - Never  Interpreter Needed?: No  Information entered by :: AEversole, LPN  Past Medical History:  Diagnosis Date  . Breast cancer ([place]) right   2011, T1A N0, radiation  . Esophageal reflux   . Hodgkin's disease ([place]) 2004  . Hypertension 2006  . Personal history of malignant neoplasm of breast 2011   right breast   Past Surgical History:  Procedure Laterality Date  . ABDOMINAL HYSTERECTOMY    . BACK SURGERY    . BREAST BIOPSY Right 2011     radiation  . BREAST SURGERY Right 2011   right breast lumpectomy,L/SN/R for right breast CA   . COLONOSCOPY  2003  . PORT A CATH REVISION    . WRIST SURGERY  2005   Family History  Problem Relation Age of Onset  . Cancer Father   . Stroke Brother   . Breast cancer Mother 37   Social History   Socioeconomic History  . Marital status: Married    Spouse name: [name]  . Number of children: 2  . Years of education: some college  . Highest education level: 12th grade  Social Needs  . Financial resource strain: Not hard at all  . Food insecurity - worry: Never true  . Food insecurity - inability: Never true  . Transportation needs - medical: No  . Transportation needs - non-medical: No  Occupational History  . Occupation: Retired  Tobacco Use  . Smoking status: Never Smoker  . Smokeless tobacco: Never Used  . Tobacco comment: smoking cessation materials not required  Substance and Sexual Activity  . Alcohol use: Yes    Alcohol/week: 0.0 oz    Comment: occassionally  . Drug use: No  . Sexual activity: Not Currently  Other Topics Concern  . None  Social History Narrative  . None    Outpatient Encounter Medications as of 12/25/2017  Medication Sig  . acetaminophen (TYLENOL) 650 MG CR tablet Take 1,300 mg by mouth every 8 (eight) hours  as needed for pain.  Marland Kitchen amLODipine (NORVASC) 5 MG tablet Take 1 tablet (5 mg total) by mouth 1 day or 1 dose.  Marland Kitchen anastrozole (ARIMIDEX) 1 MG tablet TAKE ONE TABLET BY MOUTH ONCE DAILY  . aspirin 81 MG tablet Take 81 mg by mouth daily.  Marland Kitchen CALCIUM-MAGNESIUM PO Take 1 tablet by mouth daily.  . cetirizine (ZYRTEC) 10 MG tablet Take 10 mg by mouth daily.  . Cholecalciferol (VITAMIN D-3 PO) Take 1 tablet by mouth daily.  Jerrye Beavers Polysacch (ALCORTIN EX) Apply topically 2 (two) times daily as needed.  . meloxicam (MOBIC) 15 MG tablet Take 1 tablet (15 mg total) by mouth daily.  . metFORMIN (GLUCOPHAGE-XR) 750 MG 24 hr tablet Take 1  tablet (750 mg total) by mouth daily with breakfast.  . metoprolol succinate (TOPROL-XL) 50 MG 24 hr tablet Take 1 tablet (50 mg total) by mouth daily.  . Multiple Vitamin (MULTIVITAMIN) tablet Take 1 tablet by mouth daily.  Marland Kitchen nystatin (MYCOSTATIN/NYSTOP) powder Apply topically 2 (two) times daily.  . pantoprazole (PROTONIX) 40 MG tablet Take 1 tablet (40 mg total) by mouth daily.  . phentermine 15 MG capsule Take 15 mg by mouth every morning.  . ranitidine (ZANTAC) 150 MG tablet Take 1 tablet (150 mg total) by mouth 2 (two) times daily. Alternate with pantoprazole (Patient not taking: Reported on 12/25/2017)   No facility-administered encounter medications on file as of 12/25/2017.     Activities of Daily Living In your present state of health, do you have any difficulty performing the following activities: 12/25/2017 07/03/2017  Hearing? N N  Comment denies hearing aids -  Vision? Y N  Comment wears eyeglasses, scheduled for eye exam tomorrow -  Difficulty concentrating or making decisions? N N  Walking or climbing stairs? Y N  Comment joint pain -  Dressing or bathing? N N  Doing errands, shopping? N N  Preparing Food and eating ? N -  Comment denies dentures -  Using the Toilet? N -  In the past six months, have you accidently leaked urine? N -  Do you have problems with loss of bowel control? N -  Managing your Medications? N -  Managing your Finances? N -  Housekeeping or managing your Housekeeping? N -  Some recent data might be hidden    Patient Care Team: Steele Sizer, MD as PCP - General (Family Medicine) Christene Lye, MD (General Surgery) Lequita Asal, MD as Referring Physician (Hematology and Oncology) Judi Cong, MD as Consulting Physician (Endocrinology) Thornton Park, MD as Consulting Physician (Orthopedic Surgery)    Assessment:   This is a routine wellness examination for Anaisha.  Exercise Activities and Dietary recommendations Current  Exercise Habits: The patient does not participate in regular exercise at present, Exercise limited by: None identified  Goals    . DIET - INCREASE WATER INTAKE     Recommend to drink at least 6-8 8oz glasses of water per day.       Fall Risk Fall Risk  12/25/2017 07/03/2017 10/31/2016 07/17/2016 03/16/2016  Falls in the past year? No No No No Yes  Number falls in past yr: - - - - 2 or more  Injury with Fall? - - - - No   Is the patient's home free of loose throw rugs in walkways, pet beds, electrical cords, etc?   Yes Does the patient have any grab bars in the bathroom? No  Does the patient use a shower chair  when bathing? No Does the patient have any stairs in or around the home? Yes If so, are there any handrails?  Yes Does the patient have adequate lighting?  Yes Does the patient use a cane, walker or w/c? No Does the patient use of an elevated toilet seat? No  Timed Get Up and Go Performed: Yes. Pt ambulated 10 feet within 8 sec. Gait stead-fast and without the use of an assistive device. No intervention required at this time. Fall risk prevention has been discussed.  Pt declined my offer to send Community Resource Referral to Care Guide for installation of grab bars in the shower, shower chair or an elevated toilet seat.  Depression Screen PHQ 2/9 Scores 12/25/2017 12/25/2017 07/03/2017 10/31/2016  PHQ - 2 Score 0 2 0 0  PHQ- 9 Score - 2 - -     Cognitive Function     6CIT Screen 12/25/2017  What Year? 0 points  What month? 0 points  What time? 0 points  Count back from 20 0 points  Months in reverse 0 points  Repeat phrase 0 points  Total Score 0    Immunization History  Administered Date(s) Administered  . Influenza, High Dose Seasonal PF 09/20/2015, 07/17/2016, 07/03/2017  . Influenza-Unspecified 07/30/2013  . Pneumococcal Conjugate-13 05/04/2016  . Pneumococcal Polysaccharide-23 08/27/2007, 06/17/2013  . Tdap 09/16/2010  . Zoster 02/28/2011    Qualifies for Shingles  Vaccine? Yes. Zostavax completed 02/28/11. Due for Shingrix vaccine. Education has been provided regarding the importance of this vaccine. Pt has been advised to call her insurance company to determine her out of pocket expense. Advised she may also receive this vaccine at her local pharmacy or Health Dept. Verbalized acceptance and understanding.  Screening Tests Health Maintenance  Topic Date Due  . MAMMOGRAM  06/21/2018  . TETANUS/TDAP  09/16/2020  . COLONOSCOPY  10/30/2021  . INFLUENZA VACCINE  Completed  . DEXA SCAN  Completed  . Hepatitis C Screening  Completed  . PNA vac Low Risk Adult  Completed    Cancer Screenings: Lung: Low Dose CT Chest recommended if Age 39-80 years, 30 pack-year currently smoking OR have quit w/in 15years. Patient does not qualify. NON-SMOKER Breast:  Up to date on Mammogram? Yes. Completed 06/21/17. Repeat every year.  Up to date of Bone Density/Dexa? Yes. Completed 12/14/16. Osteoporotic screening no longer required. Colorectal: Completed colonoscopy 10/31/11. Repeat every 10 years.  Additional Screenings: Hepatitis B/HIV/Syphillis: Does not qualify Hepatitis C Screening: Completed 03/16/16     Plan:  I have personally reviewed and addressed the Medicare Annual Wellness questionnaire and have noted the following in the patient's chart:  A. Medical and social history B. Use of alcohol, tobacco or illicit drugs  C. Current medications and supplements D. Functional ability and status E.  Nutritional status F.  Physical activity G. Advance directives and Code Status: H. List of other physicians I.  Hospitalizations, surgeries, and ER visits in previous 12 months J.  [place] such as hearing and vision if needed, cognitive and depression L. Referrals and appointments - none  In addition, I have reviewed and discussed with patient certain preventive protocols, quality metrics, and best practice recommendations. A written personalized care plan  for preventive services as well as general preventive health recommendations were provided to patient.  See attached scanned questionnaire for additional information.   Signed,  Aleatha Borer, LPN Nurse Health Advisor  I have reviewed this encounter including the documentation in this note and/or discussed this patient  with the provider, Henrine Hayter, LPN. I am certifying that I agree with the content of this note as supervising physician.  Steele Sizer, MD [facility] 12/25/2017, 1:05 PM

## 2017-12-25 NOTE — Patient Instructions (Signed)
[name] , Thank you for taking time to come for your Medicare Wellness Visit. I appreciate your ongoing commitment to your health goals. Please review the following plan we discussed and let me know if I can assist you in the future.   Screening recommendations/referrals: Colorectal Screening: Up to date Mammogram: Up to date Bone Density: Screening no longer required Lung Cancer Screening: You do not qualify for this screening Hepatitis C Screening: Screening no longer required HIV/Syphilis/Hepatitis B Screening: You do not qualify for this screening   Vision/Dental Exams: Recommended yearly ophthalmology/optometry visit for glaucoma screening and checkup Recommended yearly dental visit for hygiene and checkup  Vaccinations: Influenza vaccine: Up to date Pneumococcal vaccine: Completed series Tdap vaccine: Up to date Shingles vaccine: Please call your insurance company to determine your out of pocket expense for the Shingrix vaccine. You may also receive this vaccine at your local pharmacy or Health Dept.  Advanced directives: Advance directive discussed with you today. I have provided a copy for you to complete at home and have notarized. Once this is complete please bring a copy in to our office so we can scan it into your chart.  Conditions/risks identified: Recommend to drink at least 6-8 8oz glasses of water per day.  Next appointment: You are scheduled to see Dr. Ancil Boozer on 12/31/17 @ 8:40am.   Please schedule your Annual Wellness Visit with your Nurse Health Advisor in one year.  Preventive Care 48 Years and Older, Female Preventive care refers to lifestyle choices and visits with your health care provider that can promote health and wellness. What does preventive care include?  A yearly physical exam. This is also called an annual well check.  Dental exams once or twice a year.  Routine eye exams. Ask your health care provider how often you should have your eyes  checked.  Personal lifestyle choices, including:  Daily care of your teeth and gums.  Regular physical activity.  Eating a healthy diet.  Avoiding tobacco and drug use.  Limiting alcohol use.  Practicing safe sex.  Taking low-dose aspirin every day.  Taking vitamin and mineral supplements as recommended by your health care provider. What happens during an annual well check? The services and screenings done by your health care provider during your annual well check will depend on your age, overall health, lifestyle risk factors, and family history of disease. Counseling  Your health care provider may ask you questions about your:  Alcohol use.  Tobacco use.  Drug use.  Emotional well-being.  Home and relationship well-being.  Sexual activity.  Eating habits.  History of falls.  Memory and ability to understand (cognition).  Work and work Statistician.  Reproductive health. Screening  You may have the following tests or measurements:  Height, weight, and BMI.  Blood pressure.  Lipid and cholesterol levels. These may be checked every 5 years, or more frequently if you are over 10 years old.  Skin check.  Lung cancer screening. You may have this screening every year starting at age 30 if you have a 30-pack-year history of smoking and currently smoke or have quit within the past 15 years.  Fecal occult blood test (FOBT) of the stool. You may have this test every year starting at age 34.  Flexible sigmoidoscopy or colonoscopy. You may have a sigmoidoscopy every 5 years or a colonoscopy every 10 years starting at age 31.  Hepatitis C blood test.  Hepatitis B blood test.  Sexually transmitted disease (STD) testing.  Diabetes  screening. This is done by checking your blood sugar (glucose) after you have not eaten for a while (fasting). You may have this done every 1-3 years.  Bone density scan. This is done to screen for osteoporosis. You may have this done  starting at age 74.  Mammogram. This may be done every 1-2 years. Talk to your health care provider about how often you should have regular mammograms. Talk with your health care provider about your test results, treatment options, and if necessary, the need for more tests. Vaccines  Your health care provider may recommend certain vaccines, such as:  Influenza vaccine. This is recommended every year.  Tetanus, diphtheria, and acellular pertussis (Tdap, Td) vaccine. You may need a Td booster every 10 years.  Zoster vaccine. You may need this after age 27.  Pneumococcal 13-valent conjugate (PCV13) vaccine. One dose is recommended after age 30.  Pneumococcal polysaccharide (PPSV23) vaccine. One dose is recommended after age 83. Talk to your health care provider about which screenings and vaccines you need and how often you need them. This information is not intended to replace advice given to you by your health care provider. Make sure you discuss any questions you have with your health care provider. Document Released: 11/12/2015 Document Revised: 07/05/2016 Document Reviewed: 08/17/2015 Elsevier Interactive Patient Education  2017 [employer] Prevention in the Home Falls can cause injuries. They can happen to people of all ages. There are many things you can do to make your home safe and to help prevent falls. What can I do on the outside of my home?  Regularly fix the edges of walkways and driveways and fix any cracks.  Remove anything that might make you trip as you walk through a door, such as a raised step or threshold.  Trim any bushes or trees on the path to your home.  Use bright outdoor lighting.  Clear any walking paths of anything that might make someone trip, such as rocks or tools.  Regularly check to see if handrails are loose or broken. Make sure that both sides of any steps have handrails.  Any raised decks and porches should have guardrails on the  edges.  Have any leaves, snow, or ice cleared regularly.  Use sand or salt on walking paths during winter.  Clean up any spills in your garage right away. This includes oil or grease spills. What can I do in the bathroom?  Use night lights.  Install grab bars by the toilet and in the tub and shower. Do not use towel bars as grab bars.  Use non-skid mats or decals in the tub or shower.  If you need to sit down in the shower, use a plastic, non-slip stool.  Keep the floor dry. Clean up any water that spills on the floor as soon as it happens.  Remove soap buildup in the tub or shower regularly.  Attach bath mats securely with double-sided non-slip rug tape.  Do not have throw rugs and other things on the floor that can make you trip. What can I do in the bedroom?  Use night lights.  Make sure that you have a light by your bed that is easy to reach.  Do not use any sheets or blankets that are too big for your bed. They should not hang down onto the floor.  Have a firm chair that has side arms. You can use this for support while you get dressed.  Do not have throw rugs  and other things on the floor that can make you trip. What can I do in the kitchen?  Clean up any spills right away.  Avoid walking on wet floors.  Keep items that you use a lot in easy-to-reach places.  If you need to reach something above you, use a strong step stool that has a grab bar.  Keep electrical cords out of the way.  Do not use floor polish or wax that makes floors slippery. If you must use wax, use non-skid floor wax.  Do not have throw rugs and other things on the floor that can make you trip. What can I do with my stairs?  Do not leave any items on the stairs.  Make sure that there are handrails on both sides of the stairs and use them. Fix handrails that are broken or loose. Make sure that handrails are as long as the stairways.  Check any carpeting to make sure that it is firmly  attached to the stairs. Fix any carpet that is loose or worn.  Avoid having throw rugs at the top or bottom of the stairs. If you do have throw rugs, attach them to the floor with carpet tape.  Make sure that you have a light switch at the top of the stairs and the bottom of the stairs. If you do not have them, ask someone to add them for you. What else can I do to help prevent falls?  Wear shoes that:  Do not have high heels.  Have rubber bottoms.  Are comfortable and fit you well.  Are closed at the toe. Do not wear sandals.  If you use a stepladder:  Make sure that it is fully opened. Do not climb a closed stepladder.  Make sure that both sides of the stepladder are locked into place.  Ask someone to hold it for you, if possible.  Clearly mark and make sure that you can see:  Any grab bars or handrails.  First and last steps.  Where the edge of each step is.  Use tools that help you move around (mobility aids) if they are needed. These include:  Canes.  Walkers.  Scooters.  Crutches.  Turn on the lights when you go into a dark area. Replace any light bulbs as soon as they burn out.  Set up your furniture so you have a clear path. Avoid moving your furniture around.  If any of your floors are uneven, fix them.  If there are any pets around you, be aware of where they are.  Review your medicines with your doctor. Some medicines can make you feel dizzy. This can increase your chance of falling. Ask your doctor what other things that you can do to help prevent falls. This information is not intended to replace advice given to you by your health care provider. Make sure you discuss any questions you have with your health care provider. Document Released: 08/12/2009 Document Revised: 03/23/2016 Document Reviewed: 11/20/2014 Elsevier Interactive Patient Education  2017 [employer].

## 2017-12-31 ENCOUNTER — Ambulatory Visit (INDEPENDENT_AMBULATORY_CARE_PROVIDER_SITE_OTHER): Payer: Medicare Other | Admitting: Family Medicine

## 2017-12-31 ENCOUNTER — Encounter: Payer: Self-pay | Admitting: Family Medicine

## 2017-12-31 VITALS — BP 118/78 | HR 93 | Temp 97.5°F | Resp 16 | Ht 67.0 in | Wt 208.0 lb

## 2017-12-31 DIAGNOSIS — I10 Essential (primary) hypertension: Secondary | ICD-10-CM

## 2017-12-31 DIAGNOSIS — M545 Low back pain, unspecified: Secondary | ICD-10-CM

## 2017-12-31 DIAGNOSIS — Z23 Encounter for immunization: Secondary | ICD-10-CM

## 2017-12-31 DIAGNOSIS — C50911 Malignant neoplasm of unspecified site of right female breast: Secondary | ICD-10-CM

## 2017-12-31 DIAGNOSIS — F439 Reaction to severe stress, unspecified: Secondary | ICD-10-CM | POA: Diagnosis not present

## 2017-12-31 DIAGNOSIS — Z8579 Personal history of other malignant neoplasms of lymphoid, hematopoietic and related tissues: Secondary | ICD-10-CM | POA: Diagnosis not present

## 2017-12-31 DIAGNOSIS — K219 Gastro-esophageal reflux disease without esophagitis: Secondary | ICD-10-CM

## 2017-12-31 DIAGNOSIS — E041 Nontoxic single thyroid nodule: Secondary | ICD-10-CM | POA: Diagnosis not present

## 2017-12-31 DIAGNOSIS — M170 Bilateral primary osteoarthritis of knee: Secondary | ICD-10-CM

## 2017-12-31 DIAGNOSIS — M810 Age-related osteoporosis without current pathological fracture: Secondary | ICD-10-CM

## 2017-12-31 DIAGNOSIS — R7303 Prediabetes: Secondary | ICD-10-CM

## 2017-12-31 DIAGNOSIS — Z8572 Personal history of non-Hodgkin lymphomas: Secondary | ICD-10-CM

## 2017-12-31 MED ORDER — METFORMIN HCL ER 750 MG PO TB24: 750.0000 mg | ORAL_TABLET | Freq: Every day | ORAL | 1 refills | Status: DC

## 2017-12-31 MED ORDER — MELOXICAM 15 MG PO TABS: 15.0000 mg | ORAL_TABLET | Freq: Every day | ORAL | 1 refills | Status: DC

## 2017-12-31 MED ORDER — PANTOPRAZOLE SODIUM 40 MG PO TBEC: 40.0000 mg | DELAYED_RELEASE_TABLET | Freq: Every day | ORAL | 1 refills | Status: DC

## 2017-12-31 MED ORDER — ZOSTER VAC RECOMB ADJUVANTED 50 MCG/0.5ML IM SUSR: 0.5000 mL | Freq: Once | INTRAMUSCULAR | 1 refills | Status: AC

## 2017-12-31 MED ORDER — METOPROLOL SUCCINATE ER 50 MG PO TB24: 50.0000 mg | ORAL_TABLET | Freq: Every day | ORAL | 1 refills | Status: DC

## 2017-12-31 MED ORDER — AMLODIPINE BESYLATE 5 MG PO TABS: 5.0000 mg | ORAL_TABLET | ORAL | 1 refills | Status: DC

## 2017-12-31 NOTE — Progress Notes (Signed)
Name: Mary Todd   MRN: [id number]    DOB: [birth year]   Date:12/31/2017       Progress Note  Subjective  Chief Complaint  Chief Complaint  Patient presents with  . Medicare Wellness    Follow up from [place] visit on 12/25/17    HPI  Pre-diabetes: she denies polyphagia, polydipsia or polyuria, she is on Metformin, denies side effects of medication. She is also seeing Dr. Mike Gip and because of being obese she is taking Adipex, she denies side effects of medication  HTN: taking medication as prescribed, no side effects. No chest pain, SOB , dizziness or palpitation. BP is at goal at home, 130's/80's, a little low today   GERD: taking she tried stopping Pantoprazole but as soon as she went off medication symptoms became severe, with heartburn, and regurgitation, and is back on medication, and having to take Ranitidine prn now  OA: taking Meloxicam daily, explained that it can make GERD worse, she tried stopping Meloxicam but pain was too intense, she states her whole body ached, so she went back to once daily. She had synvix and steroid injection back in Feb 2018 , pain on her knees is zero/10 . Sees Dr. Mack Guise  Breast Cancer: sees Dr. Jamal Collin and currently under the care of Dr. Mike Gip for her Hodgkins and Breast cancer, still on Arimidex. Had to start Prolia for osteoporosis, found on bone density of spine, she is tolerating medications well, last mammogram was normal - Fall 2018  Thyroid nodules: incidental finding on CT neck , had biopsy by Dr. Gabriel Carina Dec 2017 and biopsy was negative, still has regular follow ups with her. No dysphagia, constipation.   Obesity: she has long history of obesity, she is on Metformin, she is doing well, Dr. Gabriel Carina gave her phentermine to curb her appetite, she has noticed she is eating less, weight is stable since last visit.    Patient Active Problem List   Diagnosis Date Noted  . Anterolisthesis 08/06/2017  . DDD (degenerative disc  disease), lumbosacral 08/06/2017  . Chondromalacia patellae 07/03/2017  . Osteoporosis 12/14/2016  . Morbid obesity ([place]) 07/17/2016  . Spinal stenosis in cervical region 05/22/2016  . Thyroid nodule 05/22/2016  . GERD without esophagitis 03/16/2016  . Neck muscle spasm 03/16/2016  . History of back surgery 03/16/2016  . Osteoarthritis of both knees 03/16/2016  . Large breasts 03/16/2016  . Hypertension, benign 03/16/2016  . Perennial allergic rhinitis with seasonal variation 03/16/2016  . History of pneumococcal pneumonia 09/20/2015  . Hodgkin disease ([place]) 07/15/2014  . Breast cancer, right ([place]) 06/26/2013    Past Surgical History:  Procedure Laterality Date  . ABDOMINAL HYSTERECTOMY    . BACK SURGERY    . BREAST BIOPSY Right 2011   radiation  . BREAST SURGERY Right 2011   right breast lumpectomy,L/SN/R for right breast CA   . COLONOSCOPY  2003  . PORT A CATH REVISION    . WRIST SURGERY  2005    Family History  Problem Relation Age of Onset  . Cancer Father   . Stroke Brother   . Breast cancer Mother 13    Social History   Socioeconomic History  . Marital status: Married    Spouse name: [name]  . Number of children: 2  . Years of education: some college  . Highest education level: 12th grade  Social Needs  . Financial resource strain: Not hard at all  . Food insecurity - worry: Never true  . Food  insecurity - inability: Never true  . Transportation needs - medical: No  . Transportation needs - non-medical: No  Occupational History  . Occupation: Retired  Tobacco Use  . Smoking status: Never Smoker  . Smokeless tobacco: Never Used  . Tobacco comment: smoking cessation materials not required  Substance and Sexual Activity  . Alcohol use: Yes    Alcohol/week: 0.0 oz    Comment: occassionally  . Drug use: No  . Sexual activity: Not Currently  Other Topics Concern  . Not on file  Social History Narrative  . Not on file     Current Outpatient  Medications:  .  acetaminophen (TYLENOL) 650 MG CR tablet, Take 1,300 mg by mouth every 8 (eight) hours as needed for pain., Disp: , Rfl:  .  amLODipine (NORVASC) 5 MG tablet, Take 1 tablet (5 mg total) by mouth 1 day or 1 dose., Disp: 90 tablet, Rfl: 1 .  anastrozole (ARIMIDEX) 1 MG tablet, TAKE ONE TABLET BY MOUTH ONCE DAILY, Disp: 90 tablet, Rfl: 3 .  aspirin 81 MG tablet, Take 81 mg by mouth daily., Disp: , Rfl:  .  CALCIUM-MAGNESIUM PO, Take 1 tablet by mouth daily., Disp: , Rfl:  .  cetirizine (ZYRTEC) 10 MG tablet, Take 10 mg by mouth daily., Disp: , Rfl:  .  Cholecalciferol (VITAMIN D-3 PO), Take 1 tablet by mouth daily., Disp: , Rfl:  .  Iodoquinol-HC-Aloe Polysacch (ALCORTIN EX), Apply topically 2 (two) times daily as needed., Disp: , Rfl:  .  meloxicam (MOBIC) 15 MG tablet, Take 1 tablet (15 mg total) by mouth daily., Disp: 90 tablet, Rfl: 1 .  metFORMIN (GLUCOPHAGE-XR) 750 MG 24 hr tablet, Take 1 tablet (750 mg total) by mouth daily with breakfast., Disp: 90 tablet, Rfl: 1 .  metoprolol succinate (TOPROL-XL) 50 MG 24 hr tablet, Take 1 tablet (50 mg total) by mouth daily., Disp: 90 tablet, Rfl: 1 .  Multiple Vitamin (MULTIVITAMIN) tablet, Take 1 tablet by mouth daily., Disp: , Rfl:  .  nystatin (MYCOSTATIN/NYSTOP) powder, Apply topically 2 (two) times daily., Disp: , Rfl:  .  pantoprazole (PROTONIX) 40 MG tablet, Take 1 tablet (40 mg total) by mouth daily., Disp: 90 tablet, Rfl: 1 .  phentermine 15 MG capsule, Take 15 mg by mouth every morning., Disp: , Rfl:   Allergies  Allergen Reactions  . Augmentin [Amoxicillin-Pot Clavulanate] Nausea Only     ROS  Constitutional: Negative for fever or weight change.  Respiratory: Negative for cough and shortness of breath.   Cardiovascular: Negative for chest pain or palpitations.  Gastrointestinal: Negative for abdominal pain, no bowel changes.  Musculoskeletal: Negative for gait problem or joint swelling.  Skin: Negative for rash.   Neurological: Negative for dizziness or headache.  No other specific complaints in a complete review of systems (except as listed in HPI above).  Objective  Vitals:   12/31/17 0852  BP: 118/78  Pulse: 93  Resp: 16  Temp: (!) 97.5 F (36.4 C)  TempSrc: Oral  SpO2: 99%  Weight: 208 lb (94.3 kg)  Height: 5\' 7"  (1.702 m)    Body mass index is 32.58 kg/m.  Physical Exam  Constitutional: Patient appears well-developed and well-nourished. Obese No distress.  HEENT: head atraumatic, normocephalic, pupils equal and reactive to light, neck supple, throat within normal limits Cardiovascular: Normal rate, regular rhythm and normal heart sounds.  No murmur heard. No BLE edema. Pulmonary/Chest: Effort normal and breath sounds normal. No respiratory distress. Abdominal: Soft.  There  is no tenderness. Psychiatric: Patient has a normal mood and affect. behavior is normal. Judgment and thought content normal.  PHQ2/9: Depression screen [facility] 2/9 12/25/2017 12/25/2017 07/03/2017 10/31/2016 07/17/2016  Decreased Interest 0 1 0 0 0  Down, Depressed, Hopeless 0 1 0 0 0  PHQ - 2 Score 0 2 0 0 0  Altered sleeping - 0 - - -  Tired, decreased energy - 0 - - -  Change in appetite - 0 - - -  Feeling bad or failure about yourself  - 0 - - -  Trouble concentrating - 0 - - -  Moving slowly or fidgety/restless - 0 - - -  Suicidal thoughts - 0 - - -  PHQ-9 Score - 2 - - -  Difficult doing work/chores - Not difficult at all - - -     Fall Risk: Fall Risk  12/25/2017 07/03/2017 10/31/2016 07/17/2016 03/16/2016  Falls in the past year? No No No No Yes  Number falls in past yr: - - - - 2 or more  Injury with Fall? - - - - No     Assessment & Plan  1. Hypertension, benign  - amLODipine (NORVASC) 5 MG tablet; Take 1 tablet (5 mg total) by mouth 1 day or 1 dose.  Dispense: 90 tablet; Refill: 1 - metoprolol succinate (TOPROL-XL) 50 MG 24 hr tablet; Take 1 tablet (50 mg total) by mouth daily.  Dispense: 90  tablet; Refill: 1  2. Pre-diabetes  - metFORMIN (GLUCOPHAGE-XR) 750 MG 24 hr tablet; Take 1 tablet (750 mg total) by mouth daily with breakfast.  Dispense: 90 tablet; Refill: 1  3. Morbid obesity, unspecified obesity type ([place])  - metFORMIN (GLUCOPHAGE-XR) 750 MG 24 hr tablet; Take 1 tablet (750 mg total) by mouth daily with breakfast.  Dispense: 90 tablet; Refill: 1  4. GERD without esophagitis  - pantoprazole (PROTONIX) 40 MG tablet; Take 1 tablet (40 mg total) by mouth daily.  Dispense: 90 tablet; Refill: 1  5. Back pain at L4-L5 level   6. Malignant neoplasm of right female breast, unspecified estrogen receptor status, unspecified site of breast ([place])  Under the care of Dr. Mike Gip   7. History of lymphoma  Sees Dr. Mike Gip   8. Age-related osteoporosis without current pathological fracture  On Prolia every 6 months   9. Thyroid nodule  Seeing Dr. Gabriel Carina   10. Stress  She is under stress, carrying for her frail father, that still lives at home.   11. Need for shingles vaccine  - Zoster Vaccine Adjuvanted [facility]) injection; Inject 0.5 mLs into the muscle once for 1 dose.  Dispense: 0.5 mL; Refill: 1

## 2018-01-09 NOTE — Addendum Note (Signed)
Addended by: Chilton Greathouse on: 01/09/2018 09:13 AM   Modules accepted: Orders

## 2018-01-16 DIAGNOSIS — H2513 Age-related nuclear cataract, bilateral: Secondary | ICD-10-CM | POA: Diagnosis not present

## 2018-01-29 NOTE — Progress Notes (Signed)
[facility]  01/30/2018  Chief Complaint: Mary Todd is a 72 y.o. female with stage II Hodgkin's disease (2004) and stage IA right breast cancer (2011) who is seen for 6 month assessment.  HPI: The patient was last seen in the medical oncology clinic on 08/01/2017.  At that time, patient was doing well.  She complained of lower back pain times 1 month.  Exam revealed fibrocystic and postradiation changes in her breasts.  Patient continued on Arimidex with no perceived side effects.  She received Prolia injection.  Plain films of the lumbar spine were done on 08/01/2017 revealing advanced multilevel disc degeneration (T12-L1).  There was disc space narrowing facet arthropathy noted at the level of L3-4.  There was also advanced facet arthropathy with anterolisthesis noted at level L4-5.  Results from plain films forwarded to patient's PCP for review and further treatment.  Patient was seen by her primary care physician on 08/06/2017 for her continued back pain.  She was referred to physical therapy and advised to take Tylenol arthritis at bedtime.  Patient is being followed by Dr. Gabriel Carina for a thyroid nodule. Biopsy was negative. Dr. Gabriel Carina has elected to monitor. Patient is scheduled to follow up with endocrinologist next month.   Symptomatically, patient is doing well. Her previously reported back pain and knee pain has improved. Patient denies breast concerns. She denies any new areas of palpable adenopathy. She is not experiencing any B symptoms. She has had no issues with infections.   Patient is eating well. Her weight is stable. Patient denies  Pain in the clinic today.   Patient performs monthly self breast examinations as recommended.    Past Medical History:  Diagnosis Date  . Breast cancer ([place]) right   2011, T1A N0, radiation  . Esophageal reflux   . Hodgkin's disease ([place]) 2004  . Hypertension 2006  . Personal history of  malignant neoplasm of breast 2011   right breast    Past Surgical History:  Procedure Laterality Date  . ABDOMINAL HYSTERECTOMY    . BACK SURGERY    . BREAST BIOPSY Right 2011   radiation  . BREAST SURGERY Right 2011   right breast lumpectomy,L/SN/R for right breast CA   . COLONOSCOPY  2003  . PORT A CATH REVISION    . WRIST SURGERY  2005    Family History  Problem Relation Age of Onset  . Cancer Father   . Stroke Brother   . Breast cancer Mother 43    Social History:  reports that she has never smoked. She has never used smokeless tobacco. She reports that she drinks alcohol. She reports that she does not use drugs.  She is retired.  She previously worked for [employer].  She went on a 14-day trip to [place] in March 2018 with a group of 20 acquaintances.  She is alone today.  Allergies:  Allergies  Allergen Reactions  . Augmentin [Amoxicillin-Pot Clavulanate] Nausea Only    Current Medications: Current Outpatient Medications  Medication Sig Dispense Refill  . acetaminophen (TYLENOL) 650 MG CR tablet Take 1,300 mg by mouth every 8 (eight) hours as needed for pain.    Marland Kitchen amLODipine (NORVASC) 5 MG tablet Take 1 tablet (5 mg total) by mouth 1 day or 1 dose. 90 tablet 1  . anastrozole (ARIMIDEX) 1 MG tablet TAKE ONE TABLET BY MOUTH ONCE DAILY 90 tablet 3  . aspirin 81 MG tablet Take  81 mg by mouth daily.    Marland Kitchen CALCIUM-MAGNESIUM PO Take 1 tablet by mouth daily.    . cetirizine (ZYRTEC) 10 MG tablet Take 10 mg by mouth daily.    . Cholecalciferol (VITAMIN D-3 PO) Take 1 tablet by mouth daily.    Jerrye Beavers Polysacch (ALCORTIN EX) Apply topically 2 (two) times daily as needed.    . meloxicam (MOBIC) 15 MG tablet Take 1 tablet (15 mg total) by mouth daily. 90 tablet 1  . metFORMIN (GLUCOPHAGE-XR) 750 MG 24 hr tablet Take 1 tablet (750 mg total) by mouth daily with breakfast. 90 tablet 1  . metoprolol succinate (TOPROL-XL) 50 MG 24 hr tablet Take 1 tablet  (50 mg total) by mouth daily. 90 tablet 1  . Multiple Vitamin (MULTIVITAMIN) tablet Take 1 tablet by mouth daily.    Marland Kitchen nystatin (MYCOSTATIN/NYSTOP) powder Apply topically 2 (two) times daily.    . pantoprazole (PROTONIX) 40 MG tablet Take 1 tablet (40 mg total) by mouth daily. 90 tablet 1  . phentermine 15 MG capsule Take 15 mg by mouth every morning.     No current facility-administered medications for this visit.     Review of Systems:  GENERAL:  Feels well.  No fevers or sweats. Weight stable. PERFORMANCE STATUS (ECOG):  1 HEENT:  Mouth dry. No visual changes, runny nose, sore throat, mouth sores or tenderness. Lungs: No shortness of breath or cough.  No hemoptysis. Cardiac:  No chest pain, palpitations, orthopnea, or PND. GI:  No nausea, vomiting, diarrhea, constipation, melena or hematochezia.  Colonoscopy on 06/26/2012. GU:  No urgency, frequency, dysuria, or hematuria. Musculoskeletal:  Back and knee pain pain, improved.  Ruptured disk x 1 injection.  Arthritis in knee x 3 injections.  No muscle tenderness. Extremities:  No pain or swelling. Skin:  No rashes or skin changes. Neuro:  No headache, numbness or weakness, balance or coordination issues. Endocrine:  Nodules on thyroid (benign); sees endocrinology.  Night sweats.  No diabetes or hot flashes.  Psych:  No mood changes, depression or anxiety. Pain:  No focal pain.  Review of systems:  All other systems reviewed and found to be negative.  Physical Exam: Blood pressure (!) 143/92, pulse 91, temperature (!) 96.3 F (35.7 C), temperature source Tympanic, resp. rate 18, weight 208 lb 1.8 oz (94.4 kg). GENERAL:  Well developed, well nourished, woman sitting comfortably in the exam room in no acute distress. MENTAL STATUS:  Alert and oriented to person, place and time. HEAD:  Short light brown hair.  Normocephalic, atraumatic, face symmetric, no Cushingoid features. EYES:  Glasses.  Brown eyes.  Pupils equal round and reactive  to light and accomodation.  No conjunctivitis or scleral icterus. ENT:  Oropharynx clear without lesion.  Tongue normal. Mucous membranes moist.  RESPIRATORY:  Clear to auscultation without rales, wheezes or rhonchi. CARDIOVASCULAR:  Regular rate and rhythm without murmur, rub or gallop. BREAST:  Large breasts.  Right breast with 3 cm area of scarring at the 2 o'clock position (chronic).  No masses, skin changes or nipple discharge.  Left breast with fibrocystic changes upper quadrants.  No masses, skin changes or nipple discharge.  ABDOMEN:  Soft, non-tender, with active bowel sounds, and no appreciable hepatosplenomegaly.  No masses. Back:  Slightly tender to palpation lumbar/sacral area. SKIN:  No rashes, ulcers or lesions. EXTREMITIES: No edema, no skin discoloration or tenderness.  No palpable cords. LYMPH NODES: No palpable cervical, supraclavicular, axillary or inguinal adenopathy  NEUROLOGICAL: Unremarkable. PSYCH:  Appropriate.   Appointment on 01/30/2018  Component Date Value Ref Range Status  . CA 27.29 01/30/2018 12.1  0.0 - 38.6 U/mL Final   Comment: (NOTE) Siemens Centaur Immunochemiluminometric Methodology [facility]) Values obtained with different assay methods or kits cannot be used interchangeably. Results cannot be interpreted as absolute evidence of the presence or absence of malignant disease. Performed At: [facility] [place], [place] [id number] Rush Farmer MD TD:[id number] Performed at [facility], [address] [place], [place] [address]   . Sodium 01/30/2018 136  135 - 145 mmol/L Final  . Potassium 01/30/2018 4.2  3.5 - 5.1 mmol/L Final  . Chloride 01/30/2018 102  101 - 111 mmol/L Final  . CO2 01/30/2018 27  22 - 32 mmol/L Final  . Glucose, Bld 01/30/2018 105* 65 - 99 mg/dL Final  . BUN 01/30/2018 16  6 - 20 mg/dL Final  . Creatinine, Ser 01/30/2018 0.61  0.44 - 1.00 mg/dL Final  . Calcium 01/30/2018 9.1  8.9 - 10.3  mg/dL Final  . Total Protein 01/30/2018 8.0  6.5 - 8.1 g/dL Final  . Albumin 01/30/2018 4.1  3.5 - 5.0 g/dL Final  . AST 01/30/2018 22  15 - 41 U/L Final  . ALT 01/30/2018 25  14 - 54 U/L Final  . Alkaline Phosphatase 01/30/2018 80  38 - 126 U/L Final  . Total Bilirubin 01/30/2018 0.9  0.3 - 1.2 mg/dL Final  . GFR calc non Af Amer 01/30/2018 >60  >60 mL/min Final  . GFR calc Af Amer 01/30/2018 >60  >60 mL/min Final   Comment: (NOTE) The eGFR has been calculated using the CKD EPI equation. This calculation has not been validated in all clinical situations. eGFR's persistently <60 mL/min signify possible Chronic Kidney Disease.   Georgiann Hahn gap 01/30/2018 7  5 - 15 Final   Performed at [facility], [address] [place], [place] [address]  . WBC 01/30/2018 6.1  3.6 - 11.0 K/uL Final  . RBC 01/30/2018 4.31  3.80 - 5.20 MIL/uL Final  . Hemoglobin 01/30/2018 13.4  12.0 - 16.0 g/dL Final  . HCT 01/30/2018 39.3  35.0 - 47.0 % Final  . MCV 01/30/2018 91.3  80.0 - 100.0 fL Final  . MCH 01/30/2018 31.2  26.0 - 34.0 pg Final  . MCHC 01/30/2018 34.1  32.0 - 36.0 g/dL Final  . RDW 01/30/2018 13.8  11.5 - 14.5 % Final  . Platelets 01/30/2018 230  150 - 440 K/uL Final  . Neutrophils Relative % 01/30/2018 53  % Final  . Neutro Abs 01/30/2018 3.3  1.4 - 6.5 K/uL Final  . Lymphocytes Relative 01/30/2018 33  % Final  . Lymphs Abs 01/30/2018 2.0  1.0 - 3.6 K/uL Final  . Monocytes Relative 01/30/2018 9  % Final  . Monocytes Absolute 01/30/2018 0.6  0.2 - 0.9 K/uL Final  . Eosinophils Relative 01/30/2018 4  % Final  . Eosinophils Absolute 01/30/2018 0.2  0 - 0.7 K/uL Final  . Basophils Relative 01/30/2018 1  % Final  . Basophils Absolute 01/30/2018 0.0  0 - 0.1 K/uL Final   Performed at [facility], [address], [place], [place] [address]    Assessment:  Mary Todd is a 72 y.o. female with stage II Hodgkin's disease (2004) and stage IA right breast cancer  (2011).  She was diagnosed with stage IIA Hodgkin's disease after presenting with low counts and joint pain.  Abdomen  and pelvic CT scan as well as PET scan revealed a pelvic mass and retroperitoneal lymphadenopathy.  Biopsy confirmed Hodgkin's disease.    She received ABVD times 5 through cycle 3-A and then AVD without the Bleomycin times 7 subsequent cycles.  Treatment completed in 12/2003.    CT and PET scans were negative in 05/2006 and in 06/2006.  She underwent right breast lumpectomy and sentinel lymph node biopsy on 07/21/2010.  Pathology revealed a 0.5 cm grade II invasive ductal carcinoma.  Two sentinel lymph nodes were negative.  Tumor was ER + (>90%), PR + (40%), and Her2/neu 1+.  Pathologic stage was T1aN0Mx.  She received Mammosite radiation (completed 08/22/2010).  Oncotype DX testing revealed a low risk lesion.  BRCA1/2 testing was negative.  She received 5 years of an aromatase inhibitor.  Breast cancer index (BCI) testing revealed a 6.3% (CI: 2.9%-9.6%) risk of late recurrent disease (years 5-10) after 5 years of hormonal therapy and a high likelihood of benefit from continued hormonal therapy (30% reduction).  Decision was made to continue Arimidex for 5 more years.  Bilateral mammogram on 06/20/2016 and 06/21/2017  revealed no evidence of recurrent disease (ordered by Dr. Jamal Collin).  CA27.29 has been followed:  8.4 on 07/19/2016, 13.4 on 01/24/2017, 7.1 on 08/01/2017, and 12.1 on 01/30/2018.  Thyroid ultrasound  on 04/13/2007 revealed a 2.3 cm solid and cystic nodule within the left thyroid gland. There was also a small nodule within the right thyroid gland that did not meet size criteria for measurement. There was a single benign appearing lymph node within the right neck.  FNA in 06/2016 was  "indeterminate" and in 08/2016 was "benign".    Bone density study on 09/05/2011 revealed osteopenia with a  T-score of -1.7 in L1-L4.  Bone density study on 11/14/2012 revealed  osteopenia with a  T-score of -1.5 in L1-L4.  Bone density study on 12/14/2016 revealed osteoporosis in L2-3 with a T-score of -3.1.  Left femur normal with T-score of -0.8.  She began Prolia on 01/24/2017 (last 08/01/2017).  Symptomatically, patient is doing well. Her back and knee pain has improved some.  She denies any breast concerns.  Exam reveals fibrocystic and post radiation changes to her RIGHT breast. Fibrocystic changes are noted contralaterally. Labs are unremarkable.     Plan: 1.  Labs today:  CBC with differential, CMP, CA 27.29. 2.  Prolia injection today. 3.  Continue Arimidex 1 mg daily. 4.  Continue calcium 1200 mg and vitamin D 800 IU daily. 5.  Continue to follow-up with surgery as already scheduled.  Patient to see Dr. Bary Castilla as Dr. Jamal Collin has retired.  Bilateral mammogram due in 05/2018. 6.  Continue to follow up with endocrinology ([facility]) for surveillance of thyroid nodule.   7.  Determine XRT completion date with Dr. Olena Leatherwood office. Spoke with Wilhemena Durie, Therapist, sports. Mammosite radiation completed on 08/22/2010. 8.  RTC in 6 months for MD assessment, labs (CBC with diff, CMP, CA27.29), and Prolia.   Honor Loh, NP 01/30/2018, 4:35 PM   I saw and evaluated the patient, participating in the key portions of the service and reviewing pertinent diagnostic studies and records.  I reviewed the nurse practitioner's note and agree with the findings and the plan.  The assessment and plan were discussed with the patient.  A few questions were asked by the patient and answered.   Nolon Stalls, MD 01/30/2018, 4:35 PM

## 2018-01-30 ENCOUNTER — Encounter: Payer: Self-pay | Admitting: Hematology and Oncology

## 2018-01-30 ENCOUNTER — Other Ambulatory Visit: Payer: Self-pay | Admitting: Hematology and Oncology

## 2018-01-30 ENCOUNTER — Inpatient Hospital Stay: Payer: Medicare Other

## 2018-01-30 ENCOUNTER — Inpatient Hospital Stay: Payer: Medicare Other | Attending: Hematology and Oncology | Admitting: Hematology and Oncology

## 2018-01-30 VITALS — BP 143/92 | HR 91 | Temp 96.3°F | Resp 18 | Wt 208.1 lb

## 2018-01-30 DIAGNOSIS — Z170 Estrogen receptor positive status [ER+]: Secondary | ICD-10-CM | POA: Diagnosis not present

## 2018-01-30 DIAGNOSIS — M25569 Pain in unspecified knee: Secondary | ICD-10-CM | POA: Diagnosis not present

## 2018-01-30 DIAGNOSIS — Z853 Personal history of malignant neoplasm of breast: Secondary | ICD-10-CM

## 2018-01-30 DIAGNOSIS — C50911 Malignant neoplasm of unspecified site of right female breast: Secondary | ICD-10-CM | POA: Diagnosis not present

## 2018-01-30 DIAGNOSIS — M810 Age-related osteoporosis without current pathological fracture: Secondary | ICD-10-CM

## 2018-01-30 DIAGNOSIS — M549 Dorsalgia, unspecified: Secondary | ICD-10-CM | POA: Diagnosis not present

## 2018-01-30 DIAGNOSIS — Z8571 Personal history of Hodgkin lymphoma: Secondary | ICD-10-CM | POA: Diagnosis not present

## 2018-01-30 DIAGNOSIS — I10 Essential (primary) hypertension: Secondary | ICD-10-CM | POA: Diagnosis not present

## 2018-01-30 LAB — COMPREHENSIVE METABOLIC PANEL
ALT: 25 U/L (ref 14–54)
AST: 22 U/L (ref 15–41)
Albumin: 4.1 g/dL (ref 3.5–5.0)
Alkaline Phosphatase: 80 U/L (ref 38–126)
Anion gap: 7 (ref 5–15)
BUN: 16 mg/dL (ref 6–20)
CO2: 27 mmol/L (ref 22–32)
Calcium: 9.1 mg/dL (ref 8.9–10.3)
Chloride: 102 mmol/L (ref 101–111)
Creatinine, Ser: 0.61 mg/dL (ref 0.44–1.00)
GFR calc Af Amer: >60 mL/min (ref >60)
GFR calc non Af Amer: >60 mL/min (ref >60)
Glucose, Bld: 105 mg/dL — ABNORMAL HIGH (ref 65–99)
Potassium: 4.2 mmol/L (ref 3.5–5.1)
Sodium: 136 mmol/L (ref 135–145)
Total Bilirubin: 0.9 mg/dL (ref 0.3–1.2)
Total Protein: 8 g/dL (ref 6.5–8.1)

## 2018-01-30 LAB — CBC WITH DIFFERENTIAL/PLATELET
Basophils Absolute: 0 10*3/uL (ref 0–0.1)
Basophils Relative: 1 %
Eosinophils Absolute: 0.2 10*3/uL (ref 0–0.7)
Eosinophils Relative: 4 %
HCT: 39.3 % (ref 35.0–47.0)
Hemoglobin: 13.4 g/dL (ref 12.0–16.0)
Lymphocytes Relative: 33 %
Lymphs Abs: 2 10*3/uL (ref 1.0–3.6)
MCH: 31.2 pg (ref 26.0–34.0)
MCHC: 34.1 g/dL (ref 32.0–36.0)
MCV: 91.3 fL (ref 80.0–100.0)
Monocytes Absolute: 0.6 10*3/uL (ref 0.2–0.9)
Monocytes Relative: 9 %
Neutro Abs: 3.3 10*3/uL (ref 1.4–6.5)
Neutrophils Relative %: 53 %
Platelets: 230 10*3/uL (ref 150–440)
RBC: 4.31 MIL/uL (ref 3.80–5.20)
RDW: 13.8 % (ref 11.5–14.5)
WBC: 6.1 10*3/uL (ref 3.6–11.0)

## 2018-01-30 MED ORDER — DENOSUMAB 60 MG/ML ~~LOC~~ SOLN
60.0000 mg | Freq: Once | SUBCUTANEOUS | Status: AC
Start: 2018-01-30 — End: 2018-01-30
  Administered 2018-01-30: 60 mg via SUBCUTANEOUS

## 2018-01-30 NOTE — Progress Notes (Signed)
Patient offers no complaints today. 

## 2018-01-31 LAB — CA 27.29 (SERIAL MONITOR): CA 27.29: 12.1 U/mL (ref 0.0–38.6)

## 2018-03-01 DIAGNOSIS — H31002 Unspecified chorioretinal scars, left eye: Secondary | ICD-10-CM | POA: Diagnosis not present

## 2018-03-12 DIAGNOSIS — H31002 Unspecified chorioretinal scars, left eye: Secondary | ICD-10-CM | POA: Diagnosis not present

## 2018-03-12 DIAGNOSIS — H318 Other specified disorders of choroid: Secondary | ICD-10-CM | POA: Diagnosis not present

## 2018-04-16 DIAGNOSIS — H318 Other specified disorders of choroid: Secondary | ICD-10-CM | POA: Diagnosis not present

## 2018-04-17 DIAGNOSIS — E042 Nontoxic multinodular goiter: Secondary | ICD-10-CM | POA: Diagnosis not present

## 2018-04-23 DIAGNOSIS — E042 Nontoxic multinodular goiter: Secondary | ICD-10-CM | POA: Diagnosis not present

## 2018-04-25 ENCOUNTER — Other Ambulatory Visit: Payer: Self-pay

## 2018-04-25 DIAGNOSIS — Z1231 Encounter for screening mammogram for malignant neoplasm of breast: Secondary | ICD-10-CM

## 2018-05-28 DIAGNOSIS — H2512 Age-related nuclear cataract, left eye: Secondary | ICD-10-CM | POA: Diagnosis not present

## 2018-05-30 ENCOUNTER — Other Ambulatory Visit: Payer: Self-pay

## 2018-05-30 ENCOUNTER — Encounter: Payer: Self-pay | Admitting: *Deleted

## 2018-06-03 DIAGNOSIS — H318 Other specified disorders of choroid: Secondary | ICD-10-CM | POA: Diagnosis not present

## 2018-06-06 NOTE — Discharge Instructions (Signed)

## 2018-06-10 ENCOUNTER — Encounter
Admission: RE | Disposition: A | Discharge status: Home / Self Care | Payer: Self-pay | Source: Ambulatory Visit | Attending: Ophthalmology

## 2018-06-10 ENCOUNTER — Ambulatory Visit: Payer: Medicare Other | Admitting: Anesthesiology

## 2018-06-10 ENCOUNTER — Ambulatory Visit
Admission: RE | Admit: 2018-06-10 | Discharge: 2018-06-10 | Disposition: A | Discharge status: Home / Self Care | Payer: Medicare Other | Source: Ambulatory Visit | Attending: Ophthalmology | Admitting: Ophthalmology

## 2018-06-10 DIAGNOSIS — Z8571 Personal history of Hodgkin lymphoma: Secondary | ICD-10-CM | POA: Insufficient documentation

## 2018-06-10 DIAGNOSIS — I10 Essential (primary) hypertension: Secondary | ICD-10-CM | POA: Insufficient documentation

## 2018-06-10 DIAGNOSIS — M1990 Unspecified osteoarthritis, unspecified site: Secondary | ICD-10-CM | POA: Insufficient documentation

## 2018-06-10 DIAGNOSIS — H2512 Age-related nuclear cataract, left eye: Secondary | ICD-10-CM | POA: Diagnosis not present

## 2018-06-10 DIAGNOSIS — Z90710 Acquired absence of both cervix and uterus: Secondary | ICD-10-CM | POA: Diagnosis not present

## 2018-06-10 DIAGNOSIS — Z853 Personal history of malignant neoplasm of breast: Secondary | ICD-10-CM | POA: Insufficient documentation

## 2018-06-10 DIAGNOSIS — H25812 Combined forms of age-related cataract, left eye: Secondary | ICD-10-CM | POA: Diagnosis not present

## 2018-06-10 DIAGNOSIS — M810 Age-related osteoporosis without current pathological fracture: Secondary | ICD-10-CM | POA: Diagnosis not present

## 2018-06-10 HISTORY — DX: Unspecified osteoarthritis, unspecified site: M19.90

## 2018-06-10 HISTORY — PX: CATARACT EXTRACTION W/PHACO: SHX586

## 2018-06-10 LAB — GLUCOSE, CAPILLARY: Glucose-Capillary: 93 mg/dL (ref 70–99)

## 2018-06-10 SURGERY — PHACOEMULSIFICATION, CATARACT, WITH IOL INSERTION
Anesthesia: Monitor Anesthesia Care | Site: Eye | Laterality: Left | Wound class: "Clean "

## 2018-06-10 MED ORDER — SODIUM HYALURONATE 10 MG/ML IO SOLN
INTRAOCULAR | Status: DC | PRN
  Administered 2018-06-10: 0.55 mL via INTRAOCULAR

## 2018-06-10 MED ORDER — LACTATED RINGERS IV SOLN: INTRAVENOUS | Status: DC

## 2018-06-10 MED ORDER — ARMC OPHTHALMIC DILATING DROPS
1.0000 "application " | OPHTHALMIC | Status: DC | PRN
  Administered 2018-06-10 (×2): 1 via OPHTHALMIC

## 2018-06-10 MED ORDER — MIDAZOLAM HCL 2 MG/2ML IJ SOLN
INTRAMUSCULAR | Status: DC | PRN
  Administered 2018-06-10 (×2): 1 mg via INTRAVENOUS

## 2018-06-10 MED ORDER — ACETAMINOPHEN 325 MG PO TABS: 650.0000 mg | ORAL_TABLET | Freq: Once | ORAL | Status: DC | PRN

## 2018-06-10 MED ORDER — EPINEPHRINE PF 1 MG/ML IJ SOLN
INTRAOCULAR | Status: DC | PRN
  Administered 2018-06-10: 84 mL via OPHTHALMIC

## 2018-06-10 MED ORDER — ONDANSETRON HCL 4 MG/2ML IJ SOLN: 4.0000 mg | Freq: Once | INTRAMUSCULAR | Status: DC | PRN

## 2018-06-10 MED ORDER — ACETAMINOPHEN 160 MG/5ML PO SOLN: 325.0000 mg | ORAL | Status: DC | PRN

## 2018-06-10 MED ORDER — FENTANYL CITRATE (PF) 100 MCG/2ML IJ SOLN
INTRAMUSCULAR | Status: DC | PRN
  Administered 2018-06-10 (×2): 50 ug via INTRAVENOUS

## 2018-06-10 MED ORDER — LIDOCAINE HCL (PF) 2 % IJ SOLN
INTRAOCULAR | Status: DC | PRN
  Administered 2018-06-10: 1 mL via INTRAOCULAR

## 2018-06-10 MED ORDER — MOXIFLOXACIN HCL 0.5 % OP SOLN
OPHTHALMIC | Status: DC | PRN
  Administered 2018-06-10: 0.2 mL via OPHTHALMIC

## 2018-06-10 MED ORDER — SODIUM HYALURONATE 23 MG/ML IO SOLN
INTRAOCULAR | Status: DC | PRN
  Administered 2018-06-10: 0.6 mL via INTRAOCULAR

## 2018-06-10 SURGICAL SUPPLY — 17 items
CANNULA ANT/CHMB 27G (MISCELLANEOUS) ×1 IMPLANT
CANNULA ANT/CHMB 27GA (MISCELLANEOUS) ×3 IMPLANT
DISSECTOR HYDRO NUCLEUS 50X22 (MISCELLANEOUS) ×3 IMPLANT
GLOVE BIO SURGEON STRL SZ8 (GLOVE) ×3 IMPLANT
GLOVE SURG LX 7.5 STRW (GLOVE) ×2
GLOVE SURG LX STRL 7.5 STRW (GLOVE) ×1 IMPLANT
GOWN STRL REUS W/ TWL LRG LVL3 (GOWN DISPOSABLE) ×2 IMPLANT
GOWN STRL REUS W/TWL LRG LVL3 (GOWN DISPOSABLE) ×4
LENS IOL TECNIS ITEC 22.0 (Intraocular Lens) ×2 IMPLANT
MARKER SKIN DUAL TIP RULER LAB (MISCELLANEOUS) ×3 IMPLANT
PACK CATARACT (MISCELLANEOUS) ×3 IMPLANT
PACK DR. KING ARMS (PACKS) ×3 IMPLANT
PACK EYE AFTER SURG (MISCELLANEOUS) ×3 IMPLANT
SYR 3ML LL SCALE MARK (SYRINGE) ×3 IMPLANT
SYR TB 1ML LUER SLIP (SYRINGE) ×3 IMPLANT
WATER STERILE IRR 500ML POUR (IV SOLUTION) ×3 IMPLANT
WIPE NON LINTING 3.25X3.25 (MISCELLANEOUS) ×3 IMPLANT

## 2018-06-10 NOTE — Anesthesia Procedure Notes (Signed)
Procedure Name: MAC Date/Time: 06/10/2018 7:35 AM Performed by: Lind Guest, CRNA Pre-anesthesia Checklist: Patient identified, Emergency Drugs available, Suction available, Patient being monitored and Timeout performed Patient Re-evaluated:Patient Re-evaluated prior to induction Oxygen Delivery Method: Nasal cannula

## 2018-06-10 NOTE — Anesthesia Postprocedure Evaluation (Signed)
Anesthesia Post Note  Patient: Mary Todd  Procedure(s) Performed: CATARACT EXTRACTION PHACO AND INTRAOCULAR LENS PLACEMENT ([place]) LEFT (Left Eye)  Patient location during evaluation: PACU Anesthesia Type: MAC Level of consciousness: awake and alert, oriented and patient cooperative Pain management: pain level controlled Vital Signs Assessment: post-procedure vital signs reviewed and stable Respiratory status: spontaneous breathing, nonlabored ventilation and respiratory function stable Cardiovascular status: blood pressure returned to baseline and stable Postop Assessment: adequate PO intake Anesthetic complications: no    Darrin Nipper

## 2018-06-10 NOTE — Op Note (Signed)
OPERATIVE NOTE  Mary Todd [id number] 06/10/2018   PREOPERATIVE DIAGNOSIS:  Nuclear sclerotic cataract left eye.  H25.12   POSTOPERATIVE DIAGNOSIS:    Nuclear sclerotic cataract left eye.     PROCEDURE:  Phacoemusification with posterior chamber intraocular lens placement of the left eye   LENS:   Implant Name Type Inv. Item Serial No. Manufacturer Lot No. LRB No. Used  LENS IOL DIOP 22.0 - [id number] Intraocular Lens LENS IOL DIOP 22.0 [id number] AMO  Left 1       PCB00 +22.0   ULTRASOUND TIME: 0 minutes 23 seconds.  CDE 1.82   SURGEON:  Benay Pillow, MD, MPH   ANESTHESIA:  Topical with tetracaine drops augmented with 1% preservative-free intracameral lidocaine.  ESTIMATED BLOOD LOSS: <1 mL   COMPLICATIONS:  None.   DESCRIPTION OF PROCEDURE:  The patient was identified in the holding room and transported to the operating room and placed in the supine position under the operating microscope.  The left eye was identified as the operative eye and it was prepped and draped in the usual sterile ophthalmic fashion.   A 1.0 millimeter clear-corneal paracentesis was made at the 5:00 position. 0.5 ml of preservative-free 1% lidocaine with epinephrine was injected into the anterior chamber.  The anterior chamber was filled with Healon 5 viscoelastic.  A 2.4 millimeter keratome was used to make a near-clear corneal incision at the 2:00 position.  A curvilinear capsulorrhexis was made with a cystotome and capsulorrhexis forceps.  Balanced salt solution was used to hydrodissect and hydrodelineate the nucleus.   Phacoemulsification was then used in stop and chop fashion to remove the lens nucleus and epinucleus.  The remaining cortex was then removed using the irrigation and aspiration handpiece. Healon was then placed into the capsular bag to distend it for lens placement.  A lens was then injected into the capsular bag.  The remaining viscoelastic was aspirated.   Wounds were  hydrated with balanced salt solution.  The anterior chamber was inflated to a physiologic pressure with balanced salt solution.  Intracameral vigamox 0.1 mL undiltued was injected into the eye and a drop placed onto the ocular surface.  No wound leaks were noted.  The patient was taken to the recovery room in stable condition without complications of anesthesia or surgery  Benay Pillow 06/10/2018, 8:00 AM

## 2018-06-10 NOTE — H&P (Signed)
The History and Physical notes are on paper, have been signed, and are to be scanned.   I have examined the patient and there are no changes to the H&P.   Benay Pillow 06/10/2018 7:23 AM

## 2018-06-10 NOTE — Anesthesia Preprocedure Evaluation (Signed)
Anesthesia Evaluation  Patient identified by MRN, date of birth, ID band Patient awake    Reviewed: Allergy & Precautions, NPO status , Patient's Chart, lab work & pertinent test results  History of Anesthesia Complications Negative for: history of anesthetic complications  Airway Mallampati: II  TM Distance: >3 FB Neck ROM: Full    Dental  (+)    Pulmonary neg pulmonary ROS,    Pulmonary exam normal breath sounds clear to auscultation       Cardiovascular Exercise Tolerance: Good hypertension, Normal cardiovascular exam Rhythm:Regular Rate:Normal     Neuro/Psych negative neurological ROS     GI/Hepatic GERD  Medicated and Controlled,  Endo/Other  Pre-diabetes  Renal/GU negative Renal ROS     Musculoskeletal  (+) Arthritis , Osteoarthritis,    Abdominal   Peds  Hematology Hodgkin lymphoma (treated over 10 years ago, no recurrence); breast CA   Anesthesia Other Findings   Reproductive/Obstetrics                             Anesthesia Physical  Anesthesia Plan  ASA: II  Anesthesia Plan: MAC   Post-op Pain Management:    Induction: Intravenous  PONV Risk Score and Plan: 2 and TIVA and Midazolam  Airway Management Planned: Natural Airway  Additional Equipment:   Intra-op Plan:   Post-operative Plan:   Informed Consent: I have reviewed the patients History and Physical, chart, labs and discussed the procedure including the risks, benefits and alternatives for the proposed anesthesia with the patient or authorized representative who has indicated his/her understanding and acceptance.     Plan Discussed with: CRNA  Anesthesia Plan Comments:         Anesthesia Quick Evaluation  

## 2018-06-10 NOTE — Transfer of Care (Signed)
Immediate Anesthesia Transfer of Care Note  Patient: Mary Todd  Procedure(s) Performed: CATARACT EXTRACTION PHACO AND INTRAOCULAR LENS PLACEMENT ([facility]) LEFT (Left Eye)  Patient Location: PACU  Anesthesia Type: MAC  Level of Consciousness: awake, alert  and patient cooperative  Airway and Oxygen Therapy: Patient Spontanous Breathing and Patient connected to supplemental oxygen  Post-op Assessment: Post-op Vital signs reviewed, Patient's Cardiovascular Status Stable, Respiratory Function Stable, Patent Airway and No signs of Nausea or vomiting  Post-op Vital Signs: Reviewed and stable  Complications: No apparent anesthesia complications

## 2018-06-11 ENCOUNTER — Encounter: Payer: Self-pay | Admitting: Ophthalmology

## 2018-06-24 ENCOUNTER — Ambulatory Visit
Admission: RE | Admit: 2018-06-24 | Discharge: 2018-06-24 | Disposition: A | Discharge status: Home / Self Care | Payer: Medicare Other | Source: Ambulatory Visit | Attending: General Surgery | Admitting: General Surgery

## 2018-06-24 DIAGNOSIS — Z1231 Encounter for screening mammogram for malignant neoplasm of breast: Secondary | ICD-10-CM | POA: Diagnosis not present

## 2018-06-24 HISTORY — DX: Personal history of irradiation: Z92.3

## 2018-07-02 ENCOUNTER — Encounter: Payer: Self-pay | Admitting: General Surgery

## 2018-07-02 ENCOUNTER — Ambulatory Visit (INDEPENDENT_AMBULATORY_CARE_PROVIDER_SITE_OTHER): Payer: Medicare Other | Admitting: General Surgery

## 2018-07-02 VITALS — BP 130/72 | HR 72 | Resp 12 | Ht 66.0 in | Wt 206.0 lb

## 2018-07-02 DIAGNOSIS — Z170 Estrogen receptor positive status [ER+]: Secondary | ICD-10-CM | POA: Diagnosis not present

## 2018-07-02 DIAGNOSIS — C50211 Malignant neoplasm of upper-inner quadrant of right female breast: Secondary | ICD-10-CM

## 2018-07-02 DIAGNOSIS — Z1231 Encounter for screening mammogram for malignant neoplasm of breast: Secondary | ICD-10-CM

## 2018-07-02 DIAGNOSIS — Z1211 Encounter for screening for malignant neoplasm of colon: Secondary | ICD-10-CM | POA: Insufficient documentation

## 2018-07-02 NOTE — Patient Instructions (Addendum)
Patient to call and report about using the deodorant  Patient will be asked to return to the office in one year with a bilateral screening mammogram.The patient is aware to call back for any questions or concerns.

## 2018-07-02 NOTE — Progress Notes (Signed)
Patient ID: Mary Todd, female   DOB: 1946/07/12, 72 y.o.   MRN: [id number]  Chief Complaint  Patient presents with  . Follow-up    HPI Mary Todd is a 72 y.o. female who presents for a breast evaluation. The most recent mammogram was done on  06/24/2018. Marland Kitchen Patient has been using power under bra. She states the power burns when she applies it.  Patient does perform regular self breast checks and gets regular mammograms done.    HPI  Past Medical History:  Diagnosis Date  . Arthritis    knees, lower back  . Breast cancer (HCC) right   2011, T1A N0, radiation  . Esophageal reflux   . Hodgkin's disease (HCC) 2004  . Hypertension 2006  . Personal history of malignant neoplasm of breast 2011   right breast  . Personal history of radiation therapy 2011   RIGHT lumpectomy w/ radiation    Past Surgical History:  Procedure Laterality Date  . ABDOMINAL HYSTERECTOMY    . BACK SURGERY    . BREAST BIOPSY Right 2011   radiation  . BREAST LUMPECTOMY Right 2011   w/ radiation  . BREAST SURGERY Right 2011   right breast lumpectomy,L/SN/R for right breast CA   . CATARACT EXTRACTION W/PHACO Left 06/10/2018   Procedure: CATARACT EXTRACTION PHACO AND INTRAOCULAR LENS PLACEMENT ([facility]) LEFT;  Surgeon: Nevada Crane, MD;  Location: [facility];  Service: Ophthalmology;  Laterality: Left;  Diabetic - oral meds  . COLONOSCOPY  2003  . PORT A CATH REVISION    . WRIST SURGERY  2005    Family History  Problem Relation Age of Onset  . Cancer Father   . Stroke Brother   . Breast cancer Mother 62    Social History Social History   Tobacco Use  . Smoking status: Never Smoker  . Smokeless tobacco: Never Used  . Tobacco comment: smoking cessation materials not required  Substance Use Topics  . Alcohol use: Yes    Alcohol/week: 2.0 standard drinks    Types: 2 Glasses of wine per week    Comment: occassionally  . Drug use: No    Allergies  Allergen  Reactions  . Shrimp [Shellfish Allergy] Hives  . Augmentin [Amoxicillin-Pot Clavulanate] Nausea Only    Current Outpatient Medications  Medication Sig Dispense Refill  . acetaminophen (TYLENOL) 650 MG CR tablet Take 1,300 mg by mouth every 8 (eight) hours as needed for pain.    Marland Kitchen alendronate (FOSAMAX) 70 MG tablet Take 70 mg by mouth once a week. Take with a full glass of water on an empty stomach.    Marland Kitchen amLODipine (NORVASC) 5 MG tablet Take 1 tablet (5 mg total) by mouth 1 day or 1 dose. 90 tablet 1  . anastrozole (ARIMIDEX) 1 MG tablet TAKE ONE TABLET BY MOUTH ONCE DAILY 90 tablet 3  . aspirin 81 MG tablet Take 81 mg by mouth daily.    Marland Kitchen CALCIUM-MAGNESIUM PO Take 1 tablet by mouth daily.    . cetirizine (ZYRTEC) 10 MG tablet Take 10 mg by mouth daily.    . Cholecalciferol (VITAMIN D-3 PO) Take 1 tablet by mouth daily.    . meloxicam (MOBIC) 15 MG tablet Take 1 tablet (15 mg total) by mouth daily. 90 tablet 1  . metFORMIN (GLUCOPHAGE-XR) 750 MG 24 hr tablet Take 1 tablet (750 mg total) by mouth daily with breakfast. 90 tablet 1  . metoprolol succinate (TOPROL-XL) 50 MG 24 hr tablet  Take 1 tablet (50 mg total) by mouth daily. 90 tablet 1  . Multiple Vitamin (MULTIVITAMIN) tablet Take 1 tablet by mouth daily.    Marland Kitchen nystatin (MYCOSTATIN/NYSTOP) powder Apply topically 2 (two) times daily.    . pantoprazole (PROTONIX) 40 MG tablet Take 1 tablet (40 mg total) by mouth daily. 90 tablet 1   No current facility-administered medications for this visit.     Review of Systems Review of Systems  Constitutional: Negative.   Respiratory: Negative.   Cardiovascular: Negative.     Blood pressure 130/72, pulse 72, resp. rate 12, height 5\' 6"  (1.676 m), weight 206 lb (93.4 kg).  Physical Exam Physical Exam  Constitutional: She is oriented to person, place, and time. She appears well-developed and well-nourished.  Eyes: Conjunctivae are normal. No scleral icterus.  Neck: Neck supple.   Cardiovascular: Normal rate, regular rhythm and normal heart sounds.  Pulmonary/Chest: Effort normal and breath sounds normal. Right breast exhibits no inverted nipple, no mass, no nipple discharge, no skin change and no tenderness. Left breast exhibits no inverted nipple, no mass, no nipple discharge, no skin change and no tenderness.    Lymphadenopathy:    She has no cervical adenopathy.    She has no axillary adenopathy.  Neurological: She is alert and oriented to person, place, and time.  Skin: Skin is warm and dry.    Data Reviewed June 24, 2018 bilateral screening mammograms were reviewed.  Near fatty replaced breast.  Postsurgical changes in the upper inner quadrant on the right.  BI-RADS-1.  Assessment    No evidence of recurrent cancer.    Plan The patient has reported rash underneath her breasts, and she has size "G" breast and I suspect a lot of this is trapped moisture in the inframammary fold, minimal on today's exam.  A trial of a dry deodorant in this area to see if this helps relieve the moisture and subsequent yeast infection has been recommended.  She can do one side at a time and see with this control if this resolves her troubles. Patient to call and report about deodorant Patient will be asked to return to the office in one year with a bilateral screening mammogram.The patient is aware to call back for any questions or concerns.    HPI, Physical Exam, Assessment and Plan have been scribed under the direction and in the presence of Donnalee Curry, MD.  Mary Todd, CMA  I have completed the exam and reviewed the above documentation for accuracy and completeness.  I agree with the above.  Museum/gallery conservator has been used and any errors in dictation or transcription are unintentional.  Donnalee Curry, M.D., F.A.C.S.  Mary Todd 07/02/2018, 8:57 PM

## 2018-07-03 ENCOUNTER — Encounter: Payer: Self-pay | Admitting: Family Medicine

## 2018-07-03 ENCOUNTER — Ambulatory Visit (INDEPENDENT_AMBULATORY_CARE_PROVIDER_SITE_OTHER): Payer: Medicare Other | Admitting: Family Medicine

## 2018-07-03 VITALS — BP 136/78 | HR 90 | Temp 98.4°F | Resp 16 | Ht 67.0 in | Wt 206.9 lb

## 2018-07-03 DIAGNOSIS — R739 Hyperglycemia, unspecified: Secondary | ICD-10-CM | POA: Diagnosis not present

## 2018-07-03 DIAGNOSIS — C8193 Hodgkin lymphoma, unspecified, intra-abdominal lymph nodes: Secondary | ICD-10-CM | POA: Diagnosis not present

## 2018-07-03 DIAGNOSIS — R7303 Prediabetes: Secondary | ICD-10-CM | POA: Diagnosis not present

## 2018-07-03 DIAGNOSIS — M5137 Other intervertebral disc degeneration, lumbosacral region: Secondary | ICD-10-CM

## 2018-07-03 DIAGNOSIS — Z23 Encounter for immunization: Secondary | ICD-10-CM

## 2018-07-03 DIAGNOSIS — C50911 Malignant neoplasm of unspecified site of right female breast: Secondary | ICD-10-CM

## 2018-07-03 DIAGNOSIS — I10 Essential (primary) hypertension: Secondary | ICD-10-CM

## 2018-07-03 DIAGNOSIS — K219 Gastro-esophageal reflux disease without esophagitis: Secondary | ICD-10-CM | POA: Diagnosis not present

## 2018-07-03 DIAGNOSIS — M810 Age-related osteoporosis without current pathological fracture: Secondary | ICD-10-CM | POA: Diagnosis not present

## 2018-07-03 DIAGNOSIS — M170 Bilateral primary osteoarthritis of knee: Secondary | ICD-10-CM

## 2018-07-03 MED ORDER — METFORMIN HCL ER 750 MG PO TB24: 750.0000 mg | ORAL_TABLET | Freq: Every day | ORAL | 1 refills | Status: DC

## 2018-07-03 MED ORDER — MELOXICAM 15 MG PO TABS: 15.0000 mg | ORAL_TABLET | Freq: Every day | ORAL | 1 refills | Status: DC

## 2018-07-03 MED ORDER — METOPROLOL SUCCINATE ER 50 MG PO TB24: 50.0000 mg | ORAL_TABLET | Freq: Every day | ORAL | 1 refills | Status: DC

## 2018-07-03 MED ORDER — PANTOPRAZOLE SODIUM 40 MG PO TBEC: 40.0000 mg | DELAYED_RELEASE_TABLET | Freq: Every day | ORAL | 1 refills | Status: DC

## 2018-07-03 MED ORDER — AMLODIPINE BESYLATE 5 MG PO TABS: 5.0000 mg | ORAL_TABLET | ORAL | 1 refills | Status: DC

## 2018-07-03 NOTE — Progress Notes (Signed)
Name: Mary Todd   MRN: [id number]    DOB: 06/22/1946   Date:07/03/2018       Progress Note  Subjective  Chief Complaint  Chief Complaint  Patient presents with  . Prediabetes  . Hypertension    Denies any symptoms-140-135/76  . Gastroesophageal Reflux  . OA  . Breast Cancer  . Thyroid Nodule  . Obesity  . Medication Refill    HPI  Pre-diabetes: she denies polyphagia, polydipsia or polyuria, she tolerates metformin well.  Urge incontinence: seldom has episodes, she states usually when sitting and has an urge to go, when she stands up can have an accident. Discussed PT. She is going to think about it and let me know about it.   HTN: taking medication as prescribed, no side effects. No chest pain, SOB , dizziness or palpitation. BP is at goal at home, 140-135/76. Today bp also at goal, continue medication.   GERD: Elsie Ra tried stopping Pantoprazole but as soon as she went off medication symptoms became severe, with heartburn, and regurgitation, and has been taking it daily again. She is aware of long risk of PPI use   OA: taking Meloxicam daily, explained that it can make GERD worse, she tried stopping Meloxicam but pain was too intense, she states her whole body ached, so she went back to once daily. She has seen Dr. Phineas Real and 3 shots of synvisc in the past   Right Breast Cancer: under the care of Dr. Mike Gip and now Dr. Fleet Contras. Also diagnosed with hodgkin's lymphoma in 2004, breast cancer diagnosed in 2011   still on Arimidex until 2021 . Had to start Prolia for osteoporosis, found on bone density of spine, she is tolerating medications well, last mammogram was normal- Fall 2019   Thyroid nodules: incidental finding on CT neck , had biopsy by Dr. Gabriel Carina Dec 2017 and biopsy was negative, still has yearly follow ups with her. .No dysphagia, constipation, denies dry skin.   Morbid obesity : she has long history of obesity, she ison Metformin, she is  doing well, Dr. Gabriel Carina gave her phentermine to curb her appetite but is off medication at this time. She is avoiding snaking. Discussed healthy snacks.   Patient Active Problem List   Diagnosis Date Noted  . Encounter for screening mammogram for breast cancer 07/02/2018  . Anterolisthesis 08/06/2017  . DDD (degenerative disc disease), lumbosacral 08/06/2017  . Chondromalacia patellae 07/03/2017  . Osteoporosis 12/14/2016  . Morbid obesity ([place]) 07/17/2016  . Spinal stenosis in cervical region 05/22/2016  . Thyroid nodule 05/22/2016  . GERD without esophagitis 03/16/2016  . Neck muscle spasm 03/16/2016  . History of back surgery 03/16/2016  . Osteoarthritis of both knees 03/16/2016  . Large breasts 03/16/2016  . Hypertension, benign 03/16/2016  . Perennial allergic rhinitis with seasonal variation 03/16/2016  . History of pneumococcal pneumonia 09/20/2015  . Hodgkin disease ([place]) 07/15/2014  . Breast cancer, right ([place]) 06/26/2013    Past Surgical History:  Procedure Laterality Date  . ABDOMINAL HYSTERECTOMY    . BACK SURGERY    . BREAST BIOPSY Right 2011   radiation  . BREAST LUMPECTOMY Right 2011   w/ radiation  . BREAST SURGERY Right 2011   right breast lumpectomy,L/SN/R for right breast CA   . CATARACT EXTRACTION W/PHACO Left 06/10/2018   Procedure: CATARACT EXTRACTION PHACO AND INTRAOCULAR LENS PLACEMENT ([place]) LEFT;  Surgeon: Eulogio Bear, MD;  Location: [facility];  Service: Ophthalmology;  Laterality: Left;  Diabetic -  oral meds  . COLONOSCOPY  2003  . PORT A CATH REVISION    . WRIST SURGERY  2005    Family History  Problem Relation Age of Onset  . Cancer Father   . Bone cancer Father   . Breast cancer Mother 50  . Stroke Mother   . Colon cancer Maternal Grandmother     Social History   Socioeconomic History  . Marital status: Married    Spouse name: [name]  . Number of children: 2  . Years of education: some college  . Highest education  level: 12th grade  Occupational History  . Occupation: Retired  Scientific laboratory technician  . Financial resource strain: Not hard at all  . Food insecurity:    Worry: Never true    Inability: Never true  . Transportation needs:    Medical: No    Non-medical: No  Tobacco Use  . Smoking status: Never Smoker  . Smokeless tobacco: Never Used  . Tobacco comment: smoking cessation materials not required  Substance and Sexual Activity  . Alcohol use: Yes    Alcohol/week: 2.0 standard drinks    Types: 2 Glasses of wine per week    Comment: occassionally  . Drug use: No  . Sexual activity: Not Currently  Lifestyle  . Physical activity:    Days per week: 0 days    Minutes per session: 0 min  . Stress: Not at all  Relationships  . Social connections:    Talks on phone: Patient refused    Gets together: Patient refused    Attends religious service: Patient refused    Active member of club or organization: Patient refused    Attends meetings of clubs or organizations: Patient refused    Relationship status: Married  . Intimate partner violence:    Fear of current or ex partner: No    Emotionally abused: No    Physically abused: No    Forced sexual activity: No  Other Topics Concern  . Not on file  Social History Narrative  . Not on file     Current Outpatient Medications:  .  acetaminophen (TYLENOL) 650 MG CR tablet, Take 1,300 mg by mouth every 8 (eight) hours as needed for pain., Disp: , Rfl:  .  amLODipine (NORVASC) 5 MG tablet, Take 1 tablet (5 mg total) by mouth 1 day or 1 dose., Disp: 90 tablet, Rfl: 1 .  anastrozole (ARIMIDEX) 1 MG tablet, TAKE ONE TABLET BY MOUTH ONCE DAILY, Disp: 90 tablet, Rfl: 3 .  aspirin 81 MG tablet, Take 81 mg by mouth daily., Disp: , Rfl:  .  CALCIUM-MAGNESIUM PO, Take 1 tablet by mouth daily., Disp: , Rfl:  .  cetirizine (ZYRTEC) 10 MG tablet, Take 10 mg by mouth daily., Disp: , Rfl:  .  Cholecalciferol (VITAMIN D-3 PO), Take 1 tablet by mouth daily.,  Disp: , Rfl:  .  denosumab (PROLIA) 60 MG/ML SOSY injection, Inject 60 mg into the skin every 6 (six) months., Disp: , Rfl:  .  meloxicam (MOBIC) 15 MG tablet, Take 1 tablet (15 mg total) by mouth daily., Disp: 90 tablet, Rfl: 1 .  metFORMIN (GLUCOPHAGE-XR) 750 MG 24 hr tablet, Take 1 tablet (750 mg total) by mouth daily with breakfast., Disp: 90 tablet, Rfl: 1 .  metoprolol succinate (TOPROL-XL) 50 MG 24 hr tablet, Take 1 tablet (50 mg total) by mouth daily., Disp: 90 tablet, Rfl: 1 .  Multiple Vitamin (MULTIVITAMIN) tablet, Take 1 tablet by mouth daily.,  Disp: , Rfl:  .  nystatin (MYCOSTATIN/NYSTOP) powder, Apply topically 2 (two) times daily., Disp: , Rfl:  .  pantoprazole (PROTONIX) 40 MG tablet, Take 1 tablet (40 mg total) by mouth daily., Disp: 90 tablet, Rfl: 1 .  alendronate (FOSAMAX) 70 MG tablet, Take 70 mg by mouth once a week. Take with a full glass of water on an empty stomach., Disp: , Rfl:   Allergies  Allergen Reactions  . Shrimp [Shellfish Allergy] Hives  . Augmentin [Amoxicillin-Pot Clavulanate] Nausea Only     ROS  Constitutional: Negative for fever or weight change.  Respiratory: Negative for cough and shortness of breath.   Cardiovascular: Negative for chest pain or palpitations.  Gastrointestinal: Negative for abdominal pain, no bowel changes.  Musculoskeletal: Positive  For intermittent  gait problem but no  joint swelling.  Skin: Negative for rash.  Neurological: Negative for dizziness or headache.  No other specific complaints in a complete review of systems (except as listed in HPI above).  Objective  Vitals:   07/03/18 0951  BP: 136/78  Pulse: 90  Resp: 16  Temp: 98.4 F (36.9 C)  TempSrc: Oral  SpO2: 99%  Weight: 206 lb 14.4 oz (93.8 kg)    Body mass index is 33.39 kg/m.  Physical Exam  Constitutional: Patient appears well-developed and well-nourished. Obese No distress.  HEENT: head atraumatic, normocephalic, pupils equal and reactive to  light, neck supple, throat within normal limits Cardiovascular: Normal rate, regular rhythm and normal heart sounds.  No murmur heard. No BLE edema. Pulmonary/Chest: Effort normal and breath sounds normal. No respiratory distress. Abdominal: Soft.  There is no tenderness. Psychiatric: Patient has a normal mood and affect. behavior is normal. Judgment and thought content normal.  Recent Results (from the past 2160 hour(s))  Glucose, capillary     Status: None   Collection Time: 06/10/18  6:49 AM  Result Value Ref Range   Glucose-Capillary 93 70 - 99 mg/dL     PHQ2/9: Depression screen [facility] 2/9 07/03/2018 12/25/2017 12/25/2017 07/03/2017 10/31/2016  Decreased Interest 0 0 1 0 0  Down, Depressed, Hopeless 0 0 1 0 0  PHQ - 2 Score 0 0 2 0 0  Altered sleeping 0 - 0 - -  Tired, decreased energy 0 - 0 - -  Change in appetite 0 - 0 - -  Feeling bad or failure about yourself  0 - 0 - -  Trouble concentrating 0 - 0 - -  Moving slowly or fidgety/restless 0 - 0 - -  Suicidal thoughts 0 - 0 - -  PHQ-9 Score 0 - 2 - -  Difficult doing work/chores Not difficult at all - Not difficult at all - -     Fall Risk: Fall Risk  07/03/2018 12/25/2017 07/03/2017 10/31/2016 07/17/2016  Falls in the past year? No No No No No  Number falls in past yr: - - - - -  Injury with Fall? - - - - -     Functional Status Survey: Is the patient deaf or have difficulty hearing?: No Does the patient have difficulty seeing, even when wearing glasses/contacts?: No Does the patient have difficulty concentrating, remembering, or making decisions?: No Does the patient have difficulty walking or climbing stairs?: No Does the patient have difficulty dressing or bathing?: No Does the patient have difficulty doing errands alone such as visiting a doctor's office or shopping?: No    Assessment & Plan  1. Hypertension, benign  - Lipid panel - amLODipine (NORVASC)  5 MG tablet; Take 1 tablet (5 mg total) by mouth 1 day or 1 dose.   Dispense: 90 tablet; Refill: 1 - metoprolol succinate (TOPROL-XL) 50 MG 24 hr tablet; Take 1 tablet (50 mg total) by mouth daily.  Dispense: 90 tablet; Refill: 1  2. Need for immunization against influenza  - Flu vaccine HIGH DOSE PF (Fluzone High dose)  3. Hodgkin lymphoma of intra-abdominal lymph nodes, unspecified Hodgkin lymphoma type ([place])  Under the care of Dr. Mike Gip and will have CBC done at her office next month   4. Pre-diabetes  - metFORMIN (GLUCOPHAGE-XR) 750 MG 24 hr tablet; Take 1 tablet (750 mg total) by mouth daily with breakfast.  Dispense: 90 tablet; Refill: 1  5. Morbid obesity, unspecified obesity type ([place])  - metFORMIN (GLUCOPHAGE-XR) 750 MG 24 hr tablet; Take 1 tablet (750 mg total) by mouth daily with breakfast.  Dispense: 90 tablet; Refill: 1  6. Malignant neoplasm of right female breast, unspecified estrogen receptor status, unspecified site of breast ([place])  Still on Amiridex   7. Age-related osteoporosis without current pathological fracture  - VITAMIN D 25 Hydroxy (Vit-D Deficiency, Fractures)  8. Hyperglycemia  - Hemoglobin A1c  9. DDD (degenerative disc disease), lumbosacral  Doing better with tylenol at night and meloxicam during the day   10. GERD without esophagitis  Unable to wean self off, she is also unable to stop meloxicam  - pantoprazole (PROTONIX) 40 MG tablet; Take 1 tablet (40 mg total) by mouth daily.  Dispense: 90 tablet; Refill: 1  11. Primary osteoarthritis of both knees  - meloxicam (MOBIC) 15 MG tablet; Take 1 tablet (15 mg total) by mouth daily.  Dispense: 90 tablet; Refill: 1

## 2018-07-04 LAB — HEMOGLOBIN A1C
HEMOGLOBIN A1C: 5.6 %{Hb} (ref <5.7)
Mean Plasma Glucose: 114 (calc)
eAG (mmol/L): 6.3 (calc)

## 2018-07-04 LAB — LIPID PANEL
Cholesterol: 187 mg/dL (ref <200)
HDL: 79 mg/dL (ref >50)
LDL Cholesterol (Calc): 93 mg/dL (calc)
NON-HDL CHOLESTEROL (CALC): 108 mg/dL (ref <130)
Total CHOL/HDL Ratio: 2.4 (calc) (ref <5.0)
Triglycerides: 67 mg/dL (ref <150)

## 2018-07-04 LAB — VITAMIN D 25 HYDROXY (VIT D DEFICIENCY, FRACTURES): Vit D, 25-Hydroxy: 50 ng/mL (ref 30–100)

## 2018-07-08 ENCOUNTER — Other Ambulatory Visit: Payer: Self-pay

## 2018-07-09 DIAGNOSIS — H2511 Age-related nuclear cataract, right eye: Secondary | ICD-10-CM | POA: Diagnosis not present

## 2018-07-11 NOTE — Discharge Instructions (Signed)

## 2018-07-12 NOTE — Anesthesia Preprocedure Evaluation (Addendum)
Anesthesia Evaluation  Patient identified by MRN, date of birth, ID band Patient awake    Reviewed: Allergy & Precautions, NPO status , Patient's Chart, lab work & pertinent test results  History of Anesthesia Complications Negative for: history of anesthetic complications  Airway Mallampati: II  TM Distance: >3 FB Neck ROM: Full    Dental  (+)    Pulmonary neg pulmonary ROS,    Pulmonary exam normal breath sounds clear to auscultation       Cardiovascular Exercise Tolerance: Good hypertension, Normal cardiovascular exam Rhythm:Regular Rate:Normal     Neuro/Psych negative neurological ROS     GI/Hepatic GERD  Medicated and Controlled,  Endo/Other  Pre-diabetes  Renal/GU negative Renal ROS     Musculoskeletal  (+) Arthritis , Osteoarthritis,    Abdominal   Peds  Hematology Hodgkin lymphoma (treated over 10 years ago, no recurrence); breast CA   Anesthesia Other Findings   Reproductive/Obstetrics                             Anesthesia Physical  Anesthesia Plan  ASA: II  Anesthesia Plan: MAC   Post-op Pain Management:    Induction: Intravenous  PONV Risk Score and Plan: 2 and TIVA and Midazolam  Airway Management Planned: Natural Airway  Additional Equipment:   Intra-op Plan:   Post-operative Plan:   Informed Consent: I have reviewed the patients History and Physical, chart, labs and discussed the procedure including the risks, benefits and alternatives for the proposed anesthesia with the patient or authorized representative who has indicated his/her understanding and acceptance.     Plan Discussed with: CRNA  Anesthesia Plan Comments:         Anesthesia Quick Evaluation

## 2018-07-15 ENCOUNTER — Ambulatory Visit: Payer: Medicare Other | Admitting: Anesthesiology

## 2018-07-15 ENCOUNTER — Encounter
Admission: RE | Disposition: A | Discharge status: Home / Self Care | Payer: Self-pay | Source: Ambulatory Visit | Attending: Ophthalmology

## 2018-07-15 ENCOUNTER — Ambulatory Visit
Admission: RE | Admit: 2018-07-15 | Discharge: 2018-07-15 | Disposition: A | Discharge status: Home / Self Care | Payer: Medicare Other | Source: Ambulatory Visit | Attending: Ophthalmology | Admitting: Ophthalmology

## 2018-07-15 DIAGNOSIS — M1990 Unspecified osteoarthritis, unspecified site: Secondary | ICD-10-CM | POA: Insufficient documentation

## 2018-07-15 DIAGNOSIS — Z90710 Acquired absence of both cervix and uterus: Secondary | ICD-10-CM | POA: Insufficient documentation

## 2018-07-15 DIAGNOSIS — C8190 Hodgkin lymphoma, unspecified, unspecified site: Secondary | ICD-10-CM | POA: Insufficient documentation

## 2018-07-15 DIAGNOSIS — H2511 Age-related nuclear cataract, right eye: Secondary | ICD-10-CM | POA: Insufficient documentation

## 2018-07-15 DIAGNOSIS — Z9842 Cataract extraction status, left eye: Secondary | ICD-10-CM | POA: Diagnosis not present

## 2018-07-15 DIAGNOSIS — I10 Essential (primary) hypertension: Secondary | ICD-10-CM | POA: Diagnosis not present

## 2018-07-15 DIAGNOSIS — R7303 Prediabetes: Secondary | ICD-10-CM | POA: Insufficient documentation

## 2018-07-15 DIAGNOSIS — K219 Gastro-esophageal reflux disease without esophagitis: Secondary | ICD-10-CM | POA: Diagnosis not present

## 2018-07-15 DIAGNOSIS — Z853 Personal history of malignant neoplasm of breast: Secondary | ICD-10-CM | POA: Insufficient documentation

## 2018-07-15 DIAGNOSIS — M810 Age-related osteoporosis without current pathological fracture: Secondary | ICD-10-CM | POA: Insufficient documentation

## 2018-07-15 DIAGNOSIS — H25811 Combined forms of age-related cataract, right eye: Secondary | ICD-10-CM | POA: Diagnosis not present

## 2018-07-15 HISTORY — PX: CATARACT EXTRACTION W/PHACO: SHX586

## 2018-07-15 LAB — GLUCOSE, CAPILLARY: GLUCOSE-CAPILLARY: 101 mg/dL — AB (ref 70–99)

## 2018-07-15 SURGERY — PHACOEMULSIFICATION, CATARACT, WITH IOL INSERTION
Anesthesia: Monitor Anesthesia Care | Site: Eye | Laterality: Right

## 2018-07-15 MED ORDER — ARMC OPHTHALMIC DILATING DROPS
1.0000 "application " | OPHTHALMIC | Status: DC | PRN
  Administered 2018-07-15 (×3): 1 via OPHTHALMIC

## 2018-07-15 MED ORDER — SODIUM HYALURONATE 23 MG/ML IO SOLN
INTRAOCULAR | Status: DC | PRN
  Administered 2018-07-15: 0.6 mL via INTRAOCULAR

## 2018-07-15 MED ORDER — MOXIFLOXACIN HCL 0.5 % OP SOLN
OPHTHALMIC | Status: DC | PRN
  Administered 2018-07-15: 0.2 mL via OPHTHALMIC

## 2018-07-15 MED ORDER — EPINEPHRINE PF 1 MG/ML IJ SOLN
INTRAOCULAR | Status: DC | PRN
  Administered 2018-07-15: 77 mL via OPHTHALMIC

## 2018-07-15 MED ORDER — MIDAZOLAM HCL 2 MG/2ML IJ SOLN
INTRAMUSCULAR | Status: DC | PRN
  Administered 2018-07-15 (×2): 1 mg via INTRAVENOUS

## 2018-07-15 MED ORDER — ACETAMINOPHEN 160 MG/5ML PO SOLN: 325.0000 mg | ORAL | Status: DC | PRN

## 2018-07-15 MED ORDER — ONDANSETRON HCL 4 MG/2ML IJ SOLN: 4.0000 mg | Freq: Once | INTRAMUSCULAR | Status: DC | PRN

## 2018-07-15 MED ORDER — LIDOCAINE HCL (PF) 2 % IJ SOLN
INTRAOCULAR | Status: DC | PRN
  Administered 2018-07-15: 1 mL via INTRAOCULAR

## 2018-07-15 MED ORDER — LACTATED RINGERS IV SOLN: INTRAVENOUS | Status: DC

## 2018-07-15 MED ORDER — FENTANYL CITRATE (PF) 100 MCG/2ML IJ SOLN
INTRAMUSCULAR | Status: DC | PRN
  Administered 2018-07-15: 50 ug via INTRAVENOUS

## 2018-07-15 MED ORDER — SODIUM HYALURONATE 10 MG/ML IO SOLN
INTRAOCULAR | Status: DC | PRN
  Administered 2018-07-15: 0.55 mL via INTRAOCULAR

## 2018-07-15 MED ORDER — ACETAMINOPHEN 325 MG PO TABS: 650.0000 mg | ORAL_TABLET | Freq: Once | ORAL | Status: DC | PRN

## 2018-07-15 MED ORDER — ONDANSETRON HCL 4 MG/2ML IJ SOLN
INTRAMUSCULAR | Status: DC | PRN
  Administered 2018-07-15: 4 mg via INTRAVENOUS

## 2018-07-15 SURGICAL SUPPLY — 16 items
CANNULA ANT/CHMB 27GA (MISCELLANEOUS) ×3 IMPLANT
DISSECTOR HYDRO NUCLEUS 50X22 (MISCELLANEOUS) ×3 IMPLANT
GLOVE BIO SURGEON STRL SZ8 (GLOVE) ×3 IMPLANT
GLOVE SURG LX 7.5 STRW (GLOVE) ×2
GLOVE SURG LX STRL 7.5 STRW (GLOVE) ×1 IMPLANT
GOWN STRL REUS W/ TWL LRG LVL3 (GOWN DISPOSABLE) ×2 IMPLANT
GOWN STRL REUS W/TWL LRG LVL3 (GOWN DISPOSABLE) ×4
LENS IOL TECNIS ITEC 21.5 (Intraocular Lens) ×3 IMPLANT
MARKER SKIN DUAL TIP RULER LAB (MISCELLANEOUS) ×3 IMPLANT
PACK DR. KING ARMS (PACKS) ×3 IMPLANT
PACK EYE AFTER SURG (MISCELLANEOUS) ×3 IMPLANT
PACK OPTHALMIC (MISCELLANEOUS) ×3 IMPLANT
SYR 3ML LL SCALE MARK (SYRINGE) ×3 IMPLANT
SYR TB 1ML LUER SLIP (SYRINGE) ×3 IMPLANT
WATER STERILE IRR 500ML POUR (IV SOLUTION) ×3 IMPLANT
WIPE NON LINTING 3.25X3.25 (MISCELLANEOUS) ×3 IMPLANT

## 2018-07-15 NOTE — Op Note (Signed)
OPERATIVE NOTE  Mary Todd [id number] 07/15/2018   PREOPERATIVE DIAGNOSIS:  Nuclear sclerotic cataract right eye.  H25.11   POSTOPERATIVE DIAGNOSIS:    Nuclear sclerotic cataract right eye.     PROCEDURE:  Phacoemusification with posterior chamber intraocular lens placement of the right eye   LENS:   Implant Name Type Inv. Item Serial No. Manufacturer Lot No. LRB No. Used  LENS IOL DIOP 21.5 - [id number] Intraocular Lens LENS IOL DIOP 21.5 [id number] AMO  Right 1       PCB00 +21.5   ULTRASOUND TIME: 0 minutes 23 seconds.  CDE 2.44   SURGEON:  Benay Pillow, MD, MPH  ANESTHESIOLOGIST: Anesthesiologist: Darrin Nipper, MD CRNA: Georga Bora, CRNA   ANESTHESIA:  Topical with tetracaine drops augmented with 1% preservative-free intracameral lidocaine.  ESTIMATED BLOOD LOSS: less than 1 mL.   COMPLICATIONS:  None.   DESCRIPTION OF PROCEDURE:  The patient was identified in the holding room and transported to the operating room and placed in the supine position under the operating microscope.  The right eye was identified as the operative eye and it was prepped and draped in the usual sterile ophthalmic fashion.   A 1.0 millimeter clear-corneal paracentesis was made at the 10:30 position. 0.5 ml of preservative-free 1% lidocaine with epinephrine was injected into the anterior chamber.  The anterior chamber was filled with Healon 5 viscoelastic.  A 2.4 millimeter keratome was used to make a near-clear corneal incision at the 8:00 position.  A curvilinear capsulorrhexis was made with a cystotome and capsulorrhexis forceps.  Balanced salt solution was used to hydrodissect and hydrodelineate the nucleus.   Phacoemulsification was then used in stop and chop fashion to remove the lens nucleus and epinucleus.  The remaining cortex was then removed using the irrigation and aspiration handpiece. Healon was then placed into the capsular bag to distend it for lens placement.  A lens  was then injected into the capsular bag.  The remaining viscoelastic was aspirated.   Wounds were hydrated with balanced salt solution.  The anterior chamber was inflated to a physiologic pressure with balanced salt solution.   Intracameral vigamox 0.1 mL undiluted was injected into the eye and a drop placed onto the ocular surface.  No wound leaks were noted.  The patient was taken to the recovery room in stable condition without complications of anesthesia or surgery  Benay Pillow 07/15/2018, 9:31 AM

## 2018-07-15 NOTE — H&P (Signed)
The History and Physical notes are on paper, have been signed, and are to be scanned.   I have examined the patient and there are no changes to the H&P.   Mary Todd 07/15/2018 8:56 AM

## 2018-07-15 NOTE — Anesthesia Postprocedure Evaluation (Signed)
Anesthesia Post Note  Patient: Mary Todd  Procedure(s) Performed: CATARACT EXTRACTION PHACO AND INTRAOCULAR LENS PLACEMENT ([place]) RIGHT (Right Eye)  Patient location during evaluation: PACU Anesthesia Type: MAC Level of consciousness: awake and alert, oriented and patient cooperative Pain management: pain level controlled Vital Signs Assessment: post-procedure vital signs reviewed and stable Respiratory status: spontaneous breathing, nonlabored ventilation and respiratory function stable Cardiovascular status: blood pressure returned to baseline and stable Postop Assessment: adequate PO intake Anesthetic complications: no    Darrin Nipper

## 2018-07-15 NOTE — Transfer of Care (Signed)
Immediate Anesthesia Transfer of Care Note  Patient: Mary Todd  Procedure(s) Performed: CATARACT EXTRACTION PHACO AND INTRAOCULAR LENS PLACEMENT (I[facility]) RIGHT (Right Eye)  Patient Location: PACU  Anesthesia Type: MAC  Level of Consciousness: awake, alert  and patient cooperative  Airway and Oxygen Therapy: Patient Spontanous Breathing and Patient connected to supplemental oxygen  Post-op Assessment: Post-op Vital signs reviewed, Patient's Cardiovascular Status Stable, Respiratory Function Stable, Patent Airway and No signs of Nausea or vomiting  Post-op Vital Signs: Reviewed and stable  Complications: No apparent anesthesia complications

## 2018-07-16 ENCOUNTER — Encounter: Payer: Self-pay | Admitting: Ophthalmology

## 2018-07-16 ENCOUNTER — Other Ambulatory Visit: Payer: Self-pay | Admitting: Family Medicine

## 2018-07-16 DIAGNOSIS — I10 Essential (primary) hypertension: Secondary | ICD-10-CM

## 2018-07-16 MED ORDER — AMLODIPINE BESYLATE 5 MG PO TABS: 5.0000 mg | ORAL_TABLET | ORAL | 1 refills | Status: DC

## 2018-07-16 NOTE — Telephone Encounter (Signed)
Copied from [place] [id number]. Topic: Quick Communication - Rx Refill/Question >> Jul 16, 2018 10:26 AM Oliver Pila B wrote: Medication: amLODipine (NORVASC) 5 MG tablet [[id number]]   [facility] called to clarify the usage directions for the medication; contact pharmacy to advise  Agent: Please be advised that RX refills may take up to 3 business days. We ask that you follow-up with your pharmacy.

## 2018-07-17 ENCOUNTER — Telehealth: Payer: Self-pay | Admitting: *Deleted

## 2018-07-17 NOTE — Telephone Encounter (Signed)
She does not have DM, just pre-diabetes. It would be much easier if insurance companies allowed [place] to take care of our patients.  Thank you

## 2018-07-17 NOTE — Telephone Encounter (Signed)
Copied from [place] [phone]. Topic: General - Other >> Jul 17, 2018 10:35 AM Yvette Rack wrote: Reason for CRM: Judson Roch from [facility] calling wanting provider to know pt has a DX of DM but no Statin if provider can look at that at next [place]

## 2018-07-17 NOTE — Telephone Encounter (Signed)
Left message on Mary Todd voicemail at [facility] notifying her that the patient is NOT Diabetic just Pre-Diabetes.

## 2018-07-23 DIAGNOSIS — H318 Other specified disorders of choroid: Secondary | ICD-10-CM | POA: Diagnosis not present

## 2018-07-31 ENCOUNTER — Inpatient Hospital Stay: Payer: Medicare Other | Attending: Hematology and Oncology | Admitting: Hematology and Oncology

## 2018-07-31 ENCOUNTER — Encounter: Payer: Self-pay | Admitting: Hematology and Oncology

## 2018-07-31 ENCOUNTER — Inpatient Hospital Stay: Payer: Medicare Other

## 2018-07-31 VITALS — BP 148/95 | HR 77 | Temp 95.5°F | Resp 18 | Wt 207.5 lb

## 2018-07-31 VITALS — BP 162/109 | HR 82 | Resp 18

## 2018-07-31 DIAGNOSIS — M810 Age-related osteoporosis without current pathological fracture: Secondary | ICD-10-CM

## 2018-07-31 DIAGNOSIS — E041 Nontoxic single thyroid nodule: Secondary | ICD-10-CM | POA: Diagnosis not present

## 2018-07-31 DIAGNOSIS — C50911 Malignant neoplasm of unspecified site of right female breast: Secondary | ICD-10-CM | POA: Diagnosis not present

## 2018-07-31 DIAGNOSIS — Z8571 Personal history of Hodgkin lymphoma: Secondary | ICD-10-CM

## 2018-07-31 DIAGNOSIS — Z79811 Long term (current) use of aromatase inhibitors: Secondary | ICD-10-CM | POA: Diagnosis not present

## 2018-07-31 DIAGNOSIS — C8193 Hodgkin lymphoma, unspecified, intra-abdominal lymph nodes: Secondary | ICD-10-CM

## 2018-07-31 LAB — COMPREHENSIVE METABOLIC PANEL
ALT: 24 U/L (ref 0–44)
AST: 22 U/L (ref 15–41)
Albumin: 4 g/dL (ref 3.5–5.0)
Alkaline Phosphatase: 69 U/L (ref 38–126)
Anion gap: 7 (ref 5–15)
BUN: 18 mg/dL (ref 8–23)
CO2: 25 mmol/L (ref 22–32)
Calcium: 8.8 mg/dL — ABNORMAL LOW (ref 8.9–10.3)
Chloride: 104 mmol/L (ref 98–111)
Creatinine, Ser: 0.53 mg/dL (ref 0.44–1.00)
GFR calc Af Amer: >60 mL/min (ref >60)
GFR calc non Af Amer: >60 mL/min (ref >60)
Glucose, Bld: 106 mg/dL — ABNORMAL HIGH (ref 70–99)
Potassium: 3.7 mmol/L (ref 3.5–5.1)
Sodium: 136 mmol/L (ref 135–145)
Total Bilirubin: 0.8 mg/dL (ref 0.3–1.2)
Total Protein: 7.6 g/dL (ref 6.5–8.1)

## 2018-07-31 LAB — CBC WITH DIFFERENTIAL/PLATELET
Basophils Absolute: 0 10*3/uL (ref 0–0.1)
Basophils Relative: 1 %
Eosinophils Absolute: 0.4 10*3/uL (ref 0–0.7)
Eosinophils Relative: 6 %
HCT: 37 % (ref 35.0–47.0)
Hemoglobin: 12.5 g/dL (ref 12.0–16.0)
Lymphocytes Relative: 34 %
Lymphs Abs: 2.1 10*3/uL (ref 1.0–3.6)
MCH: 30.9 pg (ref 26.0–34.0)
MCHC: 33.9 g/dL (ref 32.0–36.0)
MCV: 91.2 fL (ref 80.0–100.0)
Monocytes Absolute: 0.8 10*3/uL (ref 0.2–0.9)
Monocytes Relative: 12 %
Neutro Abs: 2.9 10*3/uL (ref 1.4–6.5)
Neutrophils Relative %: 47 %
Platelets: 203 10*3/uL (ref 150–440)
RBC: 4.06 MIL/uL (ref 3.80–5.20)
RDW: 14.1 % (ref 11.5–14.5)
WBC: 6.2 10*3/uL (ref 3.6–11.0)

## 2018-07-31 MED ORDER — DENOSUMAB 60 MG/ML ~~LOC~~ SOSY
60.0000 mg | PREFILLED_SYRINGE | Freq: Once | SUBCUTANEOUS | Status: AC
  Administered 2018-07-31: 60 mg via SUBCUTANEOUS

## 2018-07-31 NOTE — Patient Instructions (Signed)

## 2018-07-31 NOTE — Progress Notes (Signed)
Patient here today for 6 month follow up regarding breast cancer.

## 2018-07-31 NOTE — Progress Notes (Signed)
[facility] day:  07/31/2018   Chief Complaint: Mary Todd is a 72 y.o. female with stage II Hodgkin's disease (2004) and stage IA right breast cancer (2011) who is seen for 6 month assessment.  HPI: The patient was last seen in the medical oncology clinic on 01/30/2018.  At that time, she was doing well. Her back and knee pain had improved some.  She denied any breast concerns.  Exam revealed fibrocystic and post radiation changes to her RIGHT breast. Fibrocystic changes were noted contralaterally. Labs were unremarkable.   She received Prolia.  Bilateral mammogram on 06/24/2018 revealed no evidence of malignancy.  She saw Dr. Bary Castilla on 07/02/2018.  Exam revealed no recurrent cancer.  She had a rash underneath her breasts due to trapped moisture.  During the interim, patient has been doing well. She continues to have pain in her back and knees. Patient states, "its all just old age I think". She continues to have a intertriginous rash under her breasts, under her abdominal pannus, and in her groin. Patient is using Nystatin topical powder several times a day.   Patient denies that she has experienced any B symptoms. She denies any interval infections.  Patient does not verbalize any concerns with regards to her breasts today. Patient performs monthly self breast examinations as recommended. Patient continues on her prescribed hormonal therapy with no perceived side effects.   Of note, patient has had interval cataract surgery on both of her eyes. For her procedures, she was asked to stop her daily calcium supplementation. Post-operatively, patient has been released to restart her daily supplements at this point.  Patient continues to receive "injections" to her RIGHT eye. He indicates that her last injection was about 2 weeks ago. Presents today with a minor RIGHT subconjunctival hemorrhage resulting from her previous injection.   Patient  advises that she maintains an adequate appetite. She is eating well. Weight today is 207 lb 7.3 oz (94.1 kg), which compared to her last visit to the clinic, represents a 1 pound decrease.    Patient denies pain in the clinic today.   Past Medical History:  Diagnosis Date  . Arthritis    knees, lower back  . Breast cancer ([place]) right   2011, T1A N0, radiation  . Esophageal reflux   . Hodgkin's disease ([place]) 2004  . Hypertension 2006  . Personal history of malignant neoplasm of breast 2011   right breast  . Personal history of radiation therapy 2011   RIGHT lumpectomy w/ radiation    Past Surgical History:  Procedure Laterality Date  . ABDOMINAL HYSTERECTOMY    . BACK SURGERY    . BREAST BIOPSY Right 2011   radiation  . BREAST LUMPECTOMY Right 2011   w/ radiation  . BREAST SURGERY Right 2011   right breast lumpectomy,L/SN/R for right breast CA   . CATARACT EXTRACTION W/PHACO Left 06/10/2018   Procedure: CATARACT EXTRACTION PHACO AND INTRAOCULAR LENS PLACEMENT ([place]) LEFT;  Surgeon: Eulogio Bear, MD;  Location: [facility];  Service: Ophthalmology;  Laterality: Left;  Diabetic - oral meds  . CATARACT EXTRACTION W/PHACO Right 07/15/2018   Procedure: CATARACT EXTRACTION PHACO AND INTRAOCULAR LENS PLACEMENT ([place]) RIGHT;  Surgeon: Eulogio Bear, MD;  Location: [facility];  Service: Ophthalmology;  Laterality: Right;  . COLONOSCOPY  2003  . PORT A CATH REVISION    . WRIST SURGERY  2005    Family History  Problem Relation  Age of Onset  . Cancer Father   . Bone cancer Father   . Breast cancer Mother 66  . Stroke Mother   . Colon cancer Maternal Grandmother     Social History:  reports that she has never smoked. She has never used smokeless tobacco. She reports that she drinks about 2.0 standard drinks of alcohol per week. She reports that she does not use drugs.  She is retired.  She previously worked for [employer].  She went on a  14-day trip to [place] in March 2018 with a group of 20 acquaintances.  She is alone today.  Allergies:  Allergies  Allergen Reactions  . Shrimp [Shellfish Allergy] Hives  . Augmentin [Amoxicillin-Pot Clavulanate] Nausea Only    Current Medications: Current Outpatient Medications  Medication Sig Dispense Refill  . acetaminophen (TYLENOL) 650 MG CR tablet Take 1,300 mg by mouth every 8 (eight) hours as needed for pain.    Marland Kitchen amLODipine (NORVASC) 5 MG tablet Take 1 tablet (5 mg total) by mouth 1 day or 1 dose. 90 tablet 1  . anastrozole (ARIMIDEX) 1 MG tablet TAKE ONE TABLET BY MOUTH ONCE DAILY 90 tablet 3  . aspirin 81 MG tablet Take 81 mg by mouth daily.    Marland Kitchen CALCIUM-MAGNESIUM PO Take 1 tablet by mouth daily.    . cetirizine (ZYRTEC) 10 MG tablet Take 10 mg by mouth daily.    . Cholecalciferol (VITAMIN D-3 PO) Take 1 tablet by mouth daily.    Marland Kitchen denosumab (PROLIA) 60 MG/ML SOSY injection Inject 60 mg into the skin every 6 (six) months.    . meloxicam (MOBIC) 15 MG tablet Take 1 tablet (15 mg total) by mouth daily. 90 tablet 1  . metFORMIN (GLUCOPHAGE-XR) 750 MG 24 hr tablet Take 1 tablet (750 mg total) by mouth daily with breakfast. 90 tablet 1  . metoprolol succinate (TOPROL-XL) 50 MG 24 hr tablet Take 1 tablet (50 mg total) by mouth daily. 90 tablet 1  . Multiple Vitamin (MULTIVITAMIN) tablet Take 1 tablet by mouth daily.    Marland Kitchen nystatin (MYCOSTATIN/NYSTOP) powder Apply topically 2 (two) times daily.    . pantoprazole (PROTONIX) 40 MG tablet Take 1 tablet (40 mg total) by mouth daily. 90 tablet 1   No current facility-administered medications for this visit.     Review of Systems:  GENERAL:  Feels well.  No fevers, sweats.  Weight loss of 1 pound. PERFORMANCE STATUS (ECOG):  1 HEENT:  s/p cataract removal.  Vision is good (needs reading glasses).  No runny nose, sore throat, mouth sores or tenderness. Lungs: No shortness of breath or cough.  No hemoptysis. Cardiac:  No chest pain,  palpitations, orthopnea, or PND. GI:  No nausea, vomiting, diarrhea, constipation, melena or hematochezia. GU:  No urgency, frequency, dysuria, or hematuria. Musculoskeletal:  Back pain and knee pain.  No muscle tenderness. Extremities:  No pain or swelling. Skin:  Candidal rash under breast using topical Nystatin.  No skin changes. Neuro:  No headache, numbness or weakness, balance or coordination issues. Endocrine:  Diabetes on Metformin.  Thyroid nodules (benign; sees endocrinology).  No hot flashes or night sweats. Psych:  No mood changes, depression or anxiety. Pain:  No focal pain. Review of systems:  All other systems reviewed and found to be negative.   Physical Exam: Blood pressure (!) 148/95, pulse 77, temperature (!) 95.5 F (35.3 C), temperature source Tympanic, resp. rate 18, weight 207 lb 7.3 oz (94.1 kg). GENERAL:  Well developed, well nourished, woman sitting comfortably in the exam room in no acute distress. MENTAL STATUS:  Alert and oriented to person, place and time. HEAD:  Short red hair.  Normocephalic, atraumatic, face symmetric, no Cushingoid features. EYES:  Brown eyes.  Pupils equal round and reactive to light and accomodation.  No conjunctivitis or scleral icterus. ENT:  Oropharynx clear without lesion.  Tongue normal. Mucous membranes moist.  RESPIRATORY:  Clear to auscultation without rales, wheezes or rhonchi. CARDIOVASCULAR:  Regular rate and rhythm without murmur, rub or gallop. BREAST:  Right breast with stable area of scarring at the 2 o'clock position.  No masses, skin changes or nipple discharge.  Left breast with fibrocystic changes upper quadrants.  No masses, skin changes or nipple discharge.  No Candidal rash under breasts. ABDOMEN:  Soft, non-tender, with active bowel sounds, and no hepatosplenomegaly.  No masses. SKIN:  No rashes, ulcers or lesions. EXTREMITIES: No edema, no skin discoloration or tenderness.  No palpable cords. LYMPH NODES: No  palpable cervical, supraclavicular, axillary or inguinal adenopathy  NEUROLOGICAL: Unremarkable. PSYCH:  Appropriate.     Appointment on 07/31/2018  Component Date Value Ref Range Status  . Sodium 07/31/2018 136  135 - 145 mmol/L Final  . Potassium 07/31/2018 3.7  3.5 - 5.1 mmol/L Final  . Chloride 07/31/2018 104  98 - 111 mmol/L Final  . CO2 07/31/2018 25  22 - 32 mmol/L Final  . Glucose, Bld 07/31/2018 106* 70 - 99 mg/dL Final  . BUN 07/31/2018 18  8 - 23 mg/dL Final  . Creatinine, Ser 07/31/2018 0.53  0.44 - 1.00 mg/dL Final  . Calcium 07/31/2018 8.8* 8.9 - 10.3 mg/dL Final  . Total Protein 07/31/2018 7.6  6.5 - 8.1 g/dL Final  . Albumin 07/31/2018 4.0  3.5 - 5.0 g/dL Final  . AST 07/31/2018 22  15 - 41 U/L Final  . ALT 07/31/2018 24  0 - 44 U/L Final  . Alkaline Phosphatase 07/31/2018 69  38 - 126 U/L Final  . Total Bilirubin 07/31/2018 0.8  0.3 - 1.2 mg/dL Final  . GFR calc non Af Amer 07/31/2018 >60  >60 mL/min Final  . GFR calc Af Amer 07/31/2018 >60  >60 mL/min Final   Comment: (NOTE) The eGFR has been calculated using the CKD EPI equation. This calculation has not been validated in all clinical situations. eGFR's persistently <60 mL/min signify possible Chronic Kidney Disease.   Georgiann Hahn gap 07/31/2018 7  5 - 15 Final   Performed at [facility], [address], [place], [place] [address]  . WBC 07/31/2018 6.2  3.6 - 11.0 K/uL Final  . RBC 07/31/2018 4.06  3.80 - 5.20 MIL/uL Final  . Hemoglobin 07/31/2018 12.5  12.0 - 16.0 g/dL Final  . HCT 07/31/2018 37.0  35.0 - 47.0 % Final  . MCV 07/31/2018 91.2  80.0 - 100.0 fL Final  . MCH 07/31/2018 30.9  26.0 - 34.0 pg Final  . MCHC 07/31/2018 33.9  32.0 - 36.0 g/dL Final  . RDW 07/31/2018 14.1  11.5 - 14.5 % Final  . Platelets 07/31/2018 203  150 - 440 K/uL Final  . Neutrophils Relative % 07/31/2018 47  % Final  . Neutro Abs 07/31/2018 2.9  1.4 - 6.5 K/uL Final  . Lymphocytes Relative 07/31/2018 34  % Final  .  Lymphs Abs 07/31/2018 2.1  1.0 - 3.6 K/uL Final  . Monocytes Relative 07/31/2018 12  % Final  . Monocytes Absolute 07/31/2018  0.8  0.2 - 0.9 K/uL Final  . Eosinophils Relative 07/31/2018 6  % Final  . Eosinophils Absolute 07/31/2018 0.4  0 - 0.7 K/uL Final  . Basophils Relative 07/31/2018 1  % Final  . Basophils Absolute 07/31/2018 0.0  0 - 0.1 K/uL Final   Performed at [facility], [address] [place], [place] [address]    Assessment:  Roselani Grajeda is a 72 y.o. female with stage II Hodgkin's disease (2004) and stage IA right breast cancer (2011).  She was diagnosed with stage IIA Hodgkin's disease after presenting with low counts and joint pain.  Abdomen and pelvic CT scan as well as PET scan revealed a pelvic mass and retroperitoneal lymphadenopathy.  Biopsy confirmed Hodgkin's disease.    She received ABVD times 5 through cycle 3-A and then AVD without the Bleomycin times 7 subsequent cycles.  Treatment completed in 12/2003.    CT and PET scans were negative in 05/2006 and in 06/2006.  She underwent right breast lumpectomy and sentinel lymph node biopsy on 07/21/2010.  Pathology revealed a 0.5 cm grade II invasive ductal carcinoma.  Two sentinel lymph nodes were negative.  Tumor was ER + (>90%), PR + (40%), and Her2/neu 1+.  Pathologic stage was T1aN0Mx.  She received Mammosite radiation (completed 08/22/2010).  Oncotype DX testing revealed a low risk lesion.  BRCA1/2 testing was negative.  She received 5 years of an aromatase inhibitor.  Breast cancer index (BCI) testing revealed a 6.3% (CI: 2.9%-9.6%) risk of late recurrent disease (years 5-10) after 5 years of hormonal therapy and a high likelihood of benefit from continued hormonal therapy (30% reduction).  Decision was made to continue Arimidex for 5 more years.  Bilateral mammogram on 06/24/2018  revealed no evidence of recurrent disease (ordered by Dr. Bary Castilla).  CA27.29 has been followed:  8.4 on  07/19/2016, 13.4 on 01/24/2017, 7.1 on 08/01/2017, and 12.1 on 01/30/2018.  Thyroid ultrasound  on 04/13/2007 revealed a 2.3 cm solid and cystic nodule within the left thyroid gland. There was also a small nodule within the right thyroid gland that did not meet size criteria for measurement. There was a single benign appearing lymph node within the right neck.  FNA in 06/2016 was  "indeterminate" and in 08/2016 was "benign".    Bone density study on 09/05/2011 revealed osteopenia with a  T-score of -1.7 in L1-L4.  Bone density study on 11/14/2012 revealed osteopenia with a  T-score of -1.5 in L1-L4.  Bone density study on 12/14/2016 revealed osteoporosis in L2-3 with a T-score of -3.1.  Left femur normal with T-score of -0.8.  She began Prolia on 01/24/2017 (last 01/30/2018).  Symptomatically, she is doing well.  She continues to have back and knee discomfort.  Exam is stable.  Plan: 1.  Labs today:  CBC with diff, CMP, CA27.29. 2.  Stage IA right breast cancer:  Continue Arimidex 1 mg/day.  Mammogram on 06/25/2019 (ordered by Dr. Bary Castilla). 3.  Stage II Hodgkin's disease:  Clinically no evidence of recurrent disease.  Imaging as clinically indicated. 4.  Osteoporosis:  Continue calcium and vitamin D.  Prolia today.  Next bone density 12/16/2018. 5.  Thyroid nodule:  Followed by endocrinology. 6.  RTC in 6 months for MD assessment, labs (CBC with diff, CMP, CA27.29), and Prolia.   Honor Loh, NP 07/31/2018, 11:13 AM   I saw and evaluated the patient, participating in the key portions of the service and reviewing pertinent diagnostic studies and records.  I reviewed the nurse practitioner's note and agree with the findings and the plan.  The assessment and plan were discussed with the patient.  Multiple questions were asked by the patient and answered.   Nolon Stalls, MD 07/31/2018, 11:13 AM

## 2018-08-01 LAB — CANCER ANTIGEN 27.29: CA 27.29: 5.7 U/mL (ref 0.0–38.6)

## 2018-09-11 ENCOUNTER — Other Ambulatory Visit: Payer: Self-pay | Admitting: Hematology and Oncology

## 2018-09-11 DIAGNOSIS — Z853 Personal history of malignant neoplasm of breast: Secondary | ICD-10-CM

## 2018-09-24 DIAGNOSIS — H318 Other specified disorders of choroid: Secondary | ICD-10-CM | POA: Diagnosis not present

## 2018-09-24 DIAGNOSIS — H35051 Retinal neovascularization, unspecified, right eye: Secondary | ICD-10-CM | POA: Diagnosis not present

## 2018-10-10 DIAGNOSIS — L0109 Other impetigo: Secondary | ICD-10-CM | POA: Diagnosis not present

## 2018-10-15 DIAGNOSIS — M1711 Unilateral primary osteoarthritis, right knee: Secondary | ICD-10-CM | POA: Diagnosis not present

## 2018-10-15 DIAGNOSIS — M1712 Unilateral primary osteoarthritis, left knee: Secondary | ICD-10-CM | POA: Diagnosis not present

## 2018-11-26 DIAGNOSIS — M1712 Unilateral primary osteoarthritis, left knee: Secondary | ICD-10-CM | POA: Diagnosis not present

## 2018-11-26 DIAGNOSIS — M1711 Unilateral primary osteoarthritis, right knee: Secondary | ICD-10-CM | POA: Diagnosis not present

## 2018-12-04 ENCOUNTER — Emergency Department: Payer: Medicare Other

## 2018-12-04 ENCOUNTER — Emergency Department
Admission: EM | Admit: 2018-12-04 | Discharge: 2018-12-04 | Disposition: A | Discharge status: Home / Self Care | Payer: Medicare Other | Attending: Emergency Medicine | Admitting: Emergency Medicine

## 2018-12-04 ENCOUNTER — Other Ambulatory Visit: Payer: Self-pay

## 2018-12-04 ENCOUNTER — Telehealth: Payer: Self-pay

## 2018-12-04 DIAGNOSIS — E860 Dehydration: Secondary | ICD-10-CM | POA: Diagnosis not present

## 2018-12-04 DIAGNOSIS — Z853 Personal history of malignant neoplasm of breast: Secondary | ICD-10-CM | POA: Insufficient documentation

## 2018-12-04 DIAGNOSIS — Z79899 Other long term (current) drug therapy: Secondary | ICD-10-CM | POA: Insufficient documentation

## 2018-12-04 DIAGNOSIS — R55 Syncope and collapse: Secondary | ICD-10-CM | POA: Diagnosis not present

## 2018-12-04 DIAGNOSIS — I951 Orthostatic hypotension: Secondary | ICD-10-CM | POA: Diagnosis not present

## 2018-12-04 DIAGNOSIS — I10 Essential (primary) hypertension: Secondary | ICD-10-CM | POA: Insufficient documentation

## 2018-12-04 DIAGNOSIS — J984 Other disorders of lung: Secondary | ICD-10-CM | POA: Diagnosis not present

## 2018-12-04 LAB — CBC WITH DIFFERENTIAL/PLATELET
Abs Immature Granulocytes: 0.03 10*3/uL (ref 0.00–0.07)
Basophils Absolute: 0 10*3/uL (ref 0.0–0.1)
Basophils Relative: 0 %
Eosinophils Absolute: 0.2 10*3/uL (ref 0.0–0.5)
Eosinophils Relative: 2 %
HCT: 33 % — ABNORMAL LOW (ref 36.0–46.0)
Hemoglobin: 10.8 g/dL — ABNORMAL LOW (ref 12.0–15.0)
Immature Granulocytes: 0 %
Lymphocytes Relative: 28 %
Lymphs Abs: 2.8 10*3/uL (ref 0.7–4.0)
MCH: 30.4 pg (ref 26.0–34.0)
MCHC: 32.7 g/dL (ref 30.0–36.0)
MCV: 93 fL (ref 80.0–100.0)
Monocytes Absolute: 1.1 10*3/uL — ABNORMAL HIGH (ref 0.1–1.0)
Monocytes Relative: 11 %
NEUTROS PCT: 59 %
Neutro Abs: 5.8 10*3/uL (ref 1.7–7.7)
Platelets: 169 10*3/uL (ref 150–400)
RBC: 3.55 MIL/uL — AB (ref 3.87–5.11)
RDW: 14.2 % (ref 11.5–15.5)
WBC: 9.8 10*3/uL (ref 4.0–10.5)
nRBC: 0 % (ref 0.0–0.2)

## 2018-12-04 LAB — URINALYSIS, COMPLETE (UACMP) WITH MICROSCOPIC
BACTERIA UA: NONE SEEN
Bilirubin Urine: NEGATIVE
Glucose, UA: NEGATIVE mg/dL
Hgb urine dipstick: NEGATIVE
Ketones, ur: NEGATIVE mg/dL
Nitrite: NEGATIVE
Protein, ur: NEGATIVE mg/dL
Specific Gravity, Urine: 1.009 (ref 1.005–1.030)
pH: 6 (ref 5.0–8.0)

## 2018-12-04 LAB — COMPREHENSIVE METABOLIC PANEL
ALT: 17 U/L (ref 0–44)
AST: 17 U/L (ref 15–41)
Albumin: 3.3 g/dL — ABNORMAL LOW (ref 3.5–5.0)
Alkaline Phosphatase: 47 U/L (ref 38–126)
Anion gap: 4 — ABNORMAL LOW (ref 5–15)
BUN: 14 mg/dL (ref 8–23)
CHLORIDE: 109 mmol/L (ref 98–111)
CO2: 25 mmol/L (ref 22–32)
Calcium: 7.7 mg/dL — ABNORMAL LOW (ref 8.9–10.3)
Creatinine, Ser: 0.67 mg/dL (ref 0.44–1.00)
GFR calc Af Amer: >60 mL/min (ref >60)
GFR calc non Af Amer: >60 mL/min (ref >60)
Glucose, Bld: 114 mg/dL — ABNORMAL HIGH (ref 70–99)
Potassium: 3.9 mmol/L (ref 3.5–5.1)
SODIUM: 138 mmol/L (ref 135–145)
Total Bilirubin: 0.7 mg/dL (ref 0.3–1.2)
Total Protein: 6.3 g/dL — ABNORMAL LOW (ref 6.5–8.1)

## 2018-12-04 LAB — TROPONIN I
Troponin I: <0.03 ng/mL (ref <0.03)
Troponin I: <0.03 ng/mL (ref <0.03)

## 2018-12-04 LAB — FIBRIN DERIVATIVES D-DIMER (ARMC ONLY): FIBRIN DERIVATIVES D-DIMER (ARMC): 618.4 ng{FEU}/mL — AB (ref 0.00–499.00)

## 2018-12-04 MED ORDER — SODIUM CHLORIDE 0.9 % IV BOLUS
1000.0000 mL | Freq: Once | INTRAVENOUS | Status: AC
  Administered 2018-12-04: 1000 mL via INTRAVENOUS

## 2018-12-04 MED ORDER — SODIUM CHLORIDE 0.9 % IV SOLN
1.0000 g | Freq: Once | INTRAVENOUS | Status: AC
  Administered 2018-12-04: 1 g via INTRAVENOUS
  Filled 2018-12-04 (×2): qty 10

## 2018-12-04 NOTE — ED Notes (Signed)
Awaiting medication from pharmacy.

## 2018-12-04 NOTE — ED Provider Notes (Signed)
[facility] Emergency Department Provider Note  ____________________________________________  Time seen: Approximately 9:18 AM  I have reviewed the triage vital signs and the nursing notes.   HISTORY  Chief Complaint Loss of Consciousness   HPI Mary Todd is a 73 y.o. female with h/o remote Hodgkin's lymphoma, breast cancer on anastrozole, arthritis and hypertension who presents for evaluation of syncope.  Patient reports that she woke up in her usual state of health this morning.  Took her antihypertensive medications.  Had her normal breakfast.  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  She was standing in the classroom when she had a near syncopal episode.  She fell onto a bookshelf.  She never collapsed to the ground.  She denies having any dizziness preceding this spell.  She has had prior history of syncope in the past.  Patient denies any recent illness, she denies headache, facial droop, slurred speech, unilateral weakness or numbness, changes in vision, fever or chills, URI symptoms, chest pain or shortness of breath, abdominal pain, vomiting or diarrhea, dysuria or hematuria.  When EMS arrived patient was hypotensive with systolics in the 11B.  She received 2 L of fluid in route with improvement of her blood pressure.BG normal.   Past Medical History:  Diagnosis Date  . Arthritis    knees, lower back  . Breast cancer ([place]) right   2011, T1A N0, radiation  . Esophageal reflux   . Hodgkin's disease ([place]) 2004  . Hypertension 2006  . Personal history of malignant neoplasm of breast 2011   right breast  . Personal history of radiation therapy 2011   RIGHT lumpectomy w/ radiation    Patient Active Problem List   Diagnosis Date Noted  . Encounter for screening mammogram for breast cancer 07/02/2018  . Anterolisthesis 08/06/2017  . DDD (degenerative disc disease), lumbosacral 08/06/2017  . Chondromalacia patellae 07/03/2017  .  Osteoporosis 12/14/2016  . Morbid obesity ([place]) 07/17/2016  . Spinal stenosis in cervical region 05/22/2016  . Thyroid nodule 05/22/2016  . GERD without esophagitis 03/16/2016  . Neck muscle spasm 03/16/2016  . History of back surgery 03/16/2016  . Osteoarthritis of both knees 03/16/2016  . Large breasts 03/16/2016  . Hypertension, benign 03/16/2016  . Perennial allergic rhinitis with seasonal variation 03/16/2016  . History of pneumococcal pneumonia 09/20/2015  . Hodgkin disease ([place]) 07/15/2014  . Breast cancer, right ([place]) 06/26/2013    Past Surgical History:  Procedure Laterality Date  . ABDOMINAL HYSTERECTOMY    . BACK SURGERY    . BREAST BIOPSY Right 2011   radiation  . BREAST LUMPECTOMY Right 2011   w/ radiation  . BREAST SURGERY Right 2011   right breast lumpectomy,L/SN/R for right breast CA   . CATARACT EXTRACTION W/PHACO Left 06/10/2018   Procedure: CATARACT EXTRACTION PHACO AND INTRAOCULAR LENS PLACEMENT ([place]) LEFT;  Surgeon: Eulogio Bear, MD;  Location: [facility];  Service: Ophthalmology;  Laterality: Left;  Diabetic - oral meds  . CATARACT EXTRACTION W/PHACO Right 07/15/2018   Procedure: CATARACT EXTRACTION PHACO AND INTRAOCULAR LENS PLACEMENT ([place]) RIGHT;  Surgeon: Eulogio Bear, MD;  Location: [facility];  Service: Ophthalmology;  Laterality: Right;  . COLONOSCOPY  2003  . PORT A CATH REVISION    . WRIST SURGERY  2005    Prior to Admission medications   Medication Sig Start Date End Date Taking? Authorizing Provider  acetaminophen (TYLENOL) 650 MG CR tablet Take 1,300 mg by mouth every 8 (  eight) hours as needed for pain.   Yes [provider]  amLODipine (NORVASC) 5 MG tablet Take 1 tablet (5 mg total) by mouth 1 day or 1 dose. Patient taking differently: Take 5 mg by mouth daily.  07/16/18  Yes Sowles, Drue Stager, MD  anastrozole (ARIMIDEX) 1 MG tablet TAKE 1 TABLET BY MOUTH ONCE DAILY 09/11/18  Yes Lequita Asal, MD    aspirin 81 MG tablet Take 81 mg by mouth daily.   Yes [provider]  CALCIUM-MAGNESIUM PO Take 1 tablet by mouth daily.   Yes [provider]  cetirizine (ZYRTEC) 10 MG tablet Take 10 mg by mouth daily.   Yes [provider]  Cholecalciferol (VITAMIN D-3 PO) Take 1 tablet by mouth daily.   Yes [provider]  denosumab (PROLIA) 60 MG/ML SOSY injection Inject 60 mg into the skin every 6 (six) months.   Yes Lequita Asal, MD  meloxicam (MOBIC) 15 MG tablet Take 1 tablet (15 mg total) by mouth daily. 07/03/18  Yes Sowles, Drue Stager, MD  metFORMIN (GLUCOPHAGE-XR) 750 MG 24 hr tablet Take 1 tablet (750 mg total) by mouth daily with breakfast. 07/03/18  Yes Sowles, Drue Stager, MD  metoprolol succinate (TOPROL-XL) 50 MG 24 hr tablet Take 1 tablet (50 mg total) by mouth daily. 07/03/18  Yes Sowles, Drue Stager, MD  Multiple Vitamin (MULTIVITAMIN) tablet Take 1 tablet by mouth daily.   Yes [provider]  nystatin (MYCOSTATIN/NYSTOP) powder Apply topically 2 (two) times daily.   Yes [provider]  pantoprazole (PROTONIX) 40 MG tablet Take 1 tablet (40 mg total) by mouth daily. 07/03/18  Yes Sowles, Drue Stager, MD    Allergies Shrimp [shellfish allergy] and Augmentin [amoxicillin-pot clavulanate]  Family History  Problem Relation Age of Onset  . Cancer Father   . Bone cancer Father   . Breast cancer Mother 32  . Stroke Mother   . Colon cancer Maternal Grandmother     Social History Social History   Tobacco Use  . Smoking status: Never Smoker  . Smokeless tobacco: Never Used  . Tobacco comment: smoking cessation materials not required  Substance Use Topics  . Alcohol use: Yes    Alcohol/week: 2.0 standard drinks    Types: 2 Glasses of wine per week    Comment: occassionally  . Drug use: No    Review of Systems  Constitutional: Negative for fever. + near syncope Eyes: Negative for visual changes. ENT: Negative for sore throat. Neck: No  neck pain  Cardiovascular: Negative for chest pain. Respiratory: Negative for shortness of breath. Gastrointestinal: Negative for abdominal pain, vomiting or diarrhea. Genitourinary: Negative for dysuria. Musculoskeletal: Negative for back pain. Skin: Negative for rash. Neurological: Negative for headaches, weakness or numbness. Psych: No SI or HI  ____________________________________________   PHYSICAL EXAM:  VITAL SIGNS: ED Triage Vitals  Enc Vitals Group     BP 12/04/18 0903 (!) 85/40     Pulse Rate 12/04/18 0903 71     Resp 12/04/18 0903 18     Temp 12/04/18 0903 97.6 F (36.4 C)     Temp Source 12/04/18 0903 Oral     SpO2 12/04/18 0903 98 %     Weight 12/04/18 0904 218 lb (98.9 kg)     Height 12/04/18 0904 5\' 5"  (1.651 m)     Head Circumference --      Peak Flow --      Pain Score 12/04/18 0904 0     Pain Loc --  Pain Edu? --      Excl. in [place]? --     Constitutional: Alert and oriented, clammy.  HEENT:      Head: Normocephalic and atraumatic.         Eyes: Conjunctivae are normal. Sclera is non-icteric.       Mouth/Throat: Mucous membranes are moist.       Neck: Supple with no signs of meningismus. Cardiovascular: Regular rate and rhythm. No murmurs, gallops, or rubs. 2+ symmetrical distal pulses are present in all extremities. No JVD. Respiratory: Normal respiratory effort. Lungs are clear to auscultation bilaterally. No wheezes, crackles, or rhonchi.  Gastrointestinal: Soft, non tender, and non distended with positive bowel sounds. No rebound or guarding. Musculoskeletal: Nontender with normal range of motion in all extremities. No edema, cyanosis, or erythema of extremities. Neurologic: Normal speech and language. EOMI, PERRL, face is symmetric, normal strength and sensation x4, no pronator drift or dysmetria Skin: Skin is warm, dry and intact. No rash noted. Psychiatric: Mood and affect are normal. Speech and behavior are  normal.  ____________________________________________   LABS (all labs ordered are listed, but only abnormal results are displayed)  Labs Reviewed  CBC WITH DIFFERENTIAL/PLATELET - Abnormal; Notable for the following components:      Result Value   RBC 3.55 (*)    Hemoglobin 10.8 (*)    HCT 33.0 (*)    Monocytes Absolute 1.1 (*)    All other components within normal limits  COMPREHENSIVE METABOLIC PANEL - Abnormal; Notable for the following components:   Glucose, Bld 114 (*)    Calcium 7.7 (*)    Total Protein 6.3 (*)    Albumin 3.3 (*)    Anion gap 4 (*)    All other components within normal limits  URINALYSIS, COMPLETE (UACMP) WITH MICROSCOPIC - Abnormal; Notable for the following components:   Color, Urine YELLOW (*)    APPearance CLEAR (*)    Leukocytes, UA TRACE (*)    All other components within normal limits  FIBRIN DERIVATIVES D-DIMER ([facility] ONLY) - Abnormal; Notable for the following components:   Fibrin derivatives D-dimer ([facility]) 618.40 (*)    All other components within normal limits  TROPONIN I  TROPONIN I   ____________________________________________  EKG  ED ECG REPORT I, Rudene Re, the attending physician, personally viewed and interpreted this ECG.  Normal sinus rhythm, rate of 71, normal intervals, normal axis, no ST elevations or depressions.  Normal EKG. Unchanged from prior ____________________________________________  RADIOLOGY  I have personally reviewed the images performed during this visit and I agree with the Radiologist's read.   Interpretation by Radiologist:  Dg Chest 2 View  Result Date: 12/04/2018 CLINICAL DATA:  Syncope EXAM: CHEST - 2 VIEW COMPARISON:  10/19/2015 FINDINGS: Linear scarring at the bases, stable in the lateral projection. There is chronic asymmetric elevation of the right diaphragm. Normal heart size and mediastinal contours. IMPRESSION: Stable from prior.  No acute finding. Electronically Signed   By: Monte Fantasia M.D.   On: 12/04/2018 10:21   Ct Head Wo Contrast  Result Date: 12/04/2018 CLINICAL DATA:  Syncope. EXAM: CT HEAD WITHOUT CONTRAST TECHNIQUE: Contiguous axial images were obtained from the base of the skull through the vertex without intravenous contrast. COMPARISON:  03/18/2012 FINDINGS: Brain: There is no evidence of acute infarct, intracranial hemorrhage, mass, midline shift, or extra-axial fluid collection. Patchy hypodensities in the cerebral white matter bilaterally have progressed from the prior study and are nonspecific but compatible with mild-to-moderate chronic  small vessel ischemic disease. Mild cerebral atrophy has also mildly progressed. Vascular: No hyperdense vessel. Skull: No fracture or suspicious osseous lesion. Unchanged sessile right frontal skull osteoma. Sinuses/Orbits: Visualized paranasal sinuses and mastoid air cells are clear. Visualized orbits are unremarkable. Other: None. IMPRESSION: 1. No evidence of acute intracranial abnormality. 2. Progressive chronic small vessel ischemic disease and cerebral atrophy. Electronically Signed   By: Logan Bores M.D.   On: 12/04/2018 13:10      ____________________________________________   PROCEDURES  Procedure(s) performed: None Procedures Critical Care performed:  None ____________________________________________   INITIAL IMPRESSION / ASSESSMENT AND PLAN / ED COURSE   73 y.o. female with h/o remote Hodgkin's lymphoma, breast cancer on anastrozole, arthritis and hypertension who presents for evaluation of syncope.  Patient arrives hypotensive even after 2 L of fluid, she is not tachycardic, no hypoxia or tachypnea, no chest pain or shortness of breath.  EKG with no evidence of dysrhythmias or ischemia.  Patient is otherwise neurologically intact.  Differential diagnoses including dehydration versus anemia versus arrhythmia versus AKI versus electrolyte abnormalities versus UTI.  No signs of stroke at this time.  Clinical  Course as of Dec 04 1345  Wed Dec 04, 2018  1345 Labs showing mild hypocalcemia which was treated IV.  No other acute abnormalities.  Age-adjusted d-dimer negative.  Patient remains with no chest pain, shortness of breath, tachycardia, hypoxia, or tachypnea.  UA negative for UTI.  Troponin x2-.  After 2 L of IV fluid patient able to ambulate with no further episodes of dizziness.  She remains neurologically intact.  Recommended holding her amlodipine and metoprolol for the next 48 hours and close follow-up with her doctor in 2 days.  Discussed standard return precautions.   [CV]    Clinical Course User Index [CV] Alfred Levins [place], MD     As part of my medical decision making, I reviewed the following data within the [place] notes reviewed and incorporated, Labs reviewed , EKG interpreted , Old EKG reviewed, Old chart reviewed, Radiograph reviewed , Notes from prior ED visits and [place] Controlled Substance Database    Pertinent labs & imaging results that were available during my care of the patient were reviewed by me and considered in my medical decision making (see chart for details).    ____________________________________________   FINAL CLINICAL IMPRESSION(S) / ED DIAGNOSES  Final diagnoses:  Near syncope  Dehydration  Orthostasis      NEW MEDICATIONS STARTED DURING THIS VISIT:  ED Discharge Orders    None       Note:  This document was prepared using Dragon voice recognition software and may include unintentional dictation errors.    Alfred Levins, [place], MD 12/04/18 [phone]

## 2018-12-04 NOTE — ED Notes (Addendum)
Report to [place], RN

## 2018-12-04 NOTE — ED Triage Notes (Signed)
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 PIV X 2 started by EMS and 1800 NS infused. Pt denies dizziness at this time but still appear diaphoretic with generalized weakness. Pt denies recent sickness. Hx of hypertension. No change in medication regimen.

## 2018-12-04 NOTE — ED Notes (Signed)
ED Provider at bedside. 

## 2018-12-04 NOTE — ED Notes (Signed)
Patient transported to CT 

## 2018-12-04 NOTE — Telephone Encounter (Signed)
Copied from [phone]. Topic: Appointment Scheduling - Scheduling Inquiry for Clinic >> Dec 04, 2018  2:57 PM Margot Ables wrote: Reason for CRM: Pt called to schedule ER follow up. She notes her BP dropped and she passed out. Pt was advised not to take BP medication for a few days until she has been seen/evaluated by PCP. No appt available until March with Dr. Ancil Boozer. Please advise.

## 2018-12-04 NOTE — Discharge Instructions (Addendum)
Hold your amlodipine and metoprolol for the next 2 days.  Drink plenty of fluids.  Follow-up with your primary care doctor in 2 days.  Return to the emergency room if you have any further episodes of dizziness, headache, chest pain, shortness of breath, fever, abdominal pain, slurred speech, facial droop, or unilateral weakness or numbness.

## 2018-12-06 ENCOUNTER — Encounter: Payer: Self-pay | Admitting: Family Medicine

## 2018-12-06 ENCOUNTER — Ambulatory Visit (INDEPENDENT_AMBULATORY_CARE_PROVIDER_SITE_OTHER): Payer: Medicare Other | Admitting: Family Medicine

## 2018-12-06 VITALS — BP 138/78 | HR 100 | Temp 98.6°F | Resp 14 | Ht 67.0 in | Wt 208.4 lb

## 2018-12-06 DIAGNOSIS — I10 Essential (primary) hypertension: Secondary | ICD-10-CM

## 2018-12-06 DIAGNOSIS — D649 Anemia, unspecified: Secondary | ICD-10-CM | POA: Diagnosis not present

## 2018-12-06 DIAGNOSIS — R55 Syncope and collapse: Secondary | ICD-10-CM

## 2018-12-06 DIAGNOSIS — R52 Pain, unspecified: Secondary | ICD-10-CM | POA: Diagnosis not present

## 2018-12-06 DIAGNOSIS — J069 Acute upper respiratory infection, unspecified: Secondary | ICD-10-CM

## 2018-12-06 LAB — POCT INFLUENZA A/B
INFLUENZA B, POC: NEGATIVE
Influenza A, POC: NEGATIVE

## 2018-12-06 LAB — CBC WITH DIFFERENTIAL/PLATELET
Absolute Monocytes: 808 cells/uL (ref 200–950)
Basophils Absolute: 52 cells/uL (ref 0–200)
Basophils Relative: 0.6 %
EOS PCT: 4.6 %
Eosinophils Absolute: 396 cells/uL (ref 15–500)
HCT: 38.1 % (ref 35.0–45.0)
Hemoglobin: 13.3 g/dL (ref 11.7–15.5)
Lymphs Abs: 2528 cells/uL (ref 850–3900)
MCH: 31.3 pg (ref 27.0–33.0)
MCHC: 34.9 g/dL (ref 32.0–36.0)
MCV: 89.6 fL (ref 80.0–100.0)
MONOS PCT: 9.4 %
MPV: 11.3 fL (ref 7.5–12.5)
Neutro Abs: 4816 cells/uL (ref 1500–7800)
Neutrophils Relative %: 56 %
Platelets: 227 10*3/uL (ref 140–400)
RBC: 4.25 10*6/uL (ref 3.80–5.10)
RDW: 13.1 % (ref 11.0–15.0)
Total Lymphocyte: 29.4 %
WBC: 8.6 10*3/uL (ref 3.8–10.8)

## 2018-12-06 LAB — COMPLETE METABOLIC PANEL WITH GFR
AG Ratio: 1.3 (calc) (ref 1.0–2.5)
ALT: 18 U/L (ref 6–29)
AST: 16 U/L (ref 10–35)
Albumin: 4.1 g/dL (ref 3.6–5.1)
Alkaline phosphatase (APISO): 76 U/L (ref 37–153)
BUN: 11 mg/dL (ref 7–25)
CO2: 27 mmol/L (ref 20–32)
Calcium: 9.2 mg/dL (ref 8.6–10.4)
Chloride: 103 mmol/L (ref 98–110)
Creat: 0.68 mg/dL (ref 0.60–0.93)
GFR, Est African American: 102 mL/min/{1.73_m2} (ref >=60)
GFR, Est Non African American: 88 mL/min/{1.73_m2} (ref >=60)
Globulin: 3.1 g/dL (calc) (ref 1.9–3.7)
Glucose, Bld: 83 mg/dL (ref 65–99)
Potassium: 4.5 mmol/L (ref 3.5–5.3)
Sodium: 139 mmol/L (ref 135–146)
Total Bilirubin: 0.5 mg/dL (ref 0.2–1.2)
Total Protein: 7.2 g/dL (ref 6.1–8.1)

## 2018-12-06 MED ORDER — METOPROLOL SUCCINATE ER 25 MG PO TB24: 25.0000 mg | ORAL_TABLET | Freq: Every day | ORAL | 3 refills | Status: DC

## 2018-12-06 MED ORDER — FLUTICASONE PROPIONATE 50 MCG/ACT NA SUSP: 2.0000 | Freq: Every day | NASAL | 0 refills | Status: DC

## 2018-12-06 MED ORDER — GUAIFENESIN ER 600 MG PO TB12: 600.0000 mg | ORAL_TABLET | Freq: Two times a day (BID) | ORAL | 0 refills | Status: DC | PRN

## 2018-12-06 MED ORDER — PROMETHAZINE-DM 6.25-15 MG/5ML PO SYRP: 5.0000 mL | ORAL_SOLUTION | Freq: Four times a day (QID) | ORAL | 0 refills | Status: DC | PRN

## 2018-12-06 NOTE — Progress Notes (Signed)
Name: Mary Todd   MRN: [id number]    DOB: [birth year]   Date:12/06/2018       Progress Note  Subjective  Chief Complaint  Chief Complaint  Patient presents with  . Follow-up    ER blood pressure low, passed out at work    HPI  Pt presents for ER follow up - was seen for syncopal episode on 12/04/2018.  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  She had laboratory testing - D-dimer was age adjusted and considered normal, Troponins were normal, CBC showed mild anemia, CMP showed some hypocalcemia which was corrected with IV medication, UA negative for UTI.  CT Head and CXR were non-contributory.  She was given 2L NS IV which seemed to help. She was told to hold her metoprolol and amlodipine x48 hours and follow up in our office.  She has not restarted her BP medications at this time.  She has remote history of hodgkin's lymphoma, still taking arimidex and seeing Dr. Mike Gip.  No DM, no hx stroke of MI.  She does have history of synnope in the past - but attributed this episode to dehydration then as well.  She denies chest pain, shortness of breath, dizziness or lightheadedness, confusion, slurred speech, weakness, since ER discharge.  Does have some fatigue and has some muscular pain in her lower back from the fall.   URI: She notes new onset URI 2 days ago (Evening after going to ER).  She denies chest pain or shortness of breath. Endorses non-productive cough, low-grade fevers, nasal congestion, and sore throat.  No body aches, but is having intermittent mild headache.  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 We will check today.  Patient Active Problem List   Diagnosis Date Noted  . Encounter for screening mammogram for breast cancer 07/02/2018  . Anterolisthesis 08/06/2017  . DDD (degenerative disc disease), lumbosacral 08/06/2017  . Chondromalacia patellae 07/03/2017  . Osteoporosis 12/14/2016  . Morbid obesity ([place]) 07/17/2016  .  Spinal stenosis in cervical region 05/22/2016  . Thyroid nodule 05/22/2016  . GERD without esophagitis 03/16/2016  . Neck muscle spasm 03/16/2016  . History of back surgery 03/16/2016  . Osteoarthritis of both knees 03/16/2016  . Large breasts 03/16/2016  . Hypertension, benign 03/16/2016  . Perennial allergic rhinitis with seasonal variation 03/16/2016  . History of pneumococcal pneumonia 09/20/2015  . Hodgkin disease ([place]) 07/15/2014  . Breast cancer, right ([place]) 06/26/2013    Past Surgical History:  Procedure Laterality Date  . ABDOMINAL HYSTERECTOMY    . BACK SURGERY    . BREAST BIOPSY Right 2011   radiation  . BREAST LUMPECTOMY Right 2011   w/ radiation  . BREAST SURGERY Right 2011   right breast lumpectomy,L/SN/R for right breast CA   . CATARACT EXTRACTION W/PHACO Left 06/10/2018   Procedure: CATARACT EXTRACTION PHACO AND INTRAOCULAR LENS PLACEMENT ([place]) LEFT;  Surgeon: Eulogio Bear, MD;  Location: [facility];  Service: Ophthalmology;  Laterality: Left;  Diabetic - oral meds  . CATARACT EXTRACTION W/PHACO Right 07/15/2018   Procedure: CATARACT EXTRACTION PHACO AND INTRAOCULAR LENS PLACEMENT ([place]) RIGHT;  Surgeon: Eulogio Bear, MD;  Location: [facility];  Service: Ophthalmology;  Laterality: Right;  . COLONOSCOPY  2003  . PORT A CATH REVISION    . WRIST SURGERY  2005    Family History  Problem Relation Age of Onset  . Cancer Father   . Bone cancer Father   .  Breast cancer Mother 11  . Stroke Mother   . Colon cancer Maternal Grandmother     Social History   Socioeconomic History  . Marital status: Married    Spouse name: [name]  . Number of children: 2  . Years of education: some college  . Highest education level: 12th grade  Occupational History  . Occupation: Retired  Scientific laboratory technician  . Financial resource strain: Not hard at all  . Food insecurity:    Worry: Never true    Inability: Never true  . Transportation needs:     Medical: No    Non-medical: No  Tobacco Use  . Smoking status: Never Smoker  . Smokeless tobacco: Never Used  . Tobacco comment: smoking cessation materials not required  Substance and Sexual Activity  . Alcohol use: Yes    Alcohol/week: 2.0 standard drinks    Types: 2 Glasses of wine per week    Comment: occassionally  . Drug use: No  . Sexual activity: Not Currently  Lifestyle  . Physical activity:    Days per week: 0 days    Minutes per session: 0 min  . Stress: Not at all  Relationships  . Social connections:    Talks on phone: Patient refused    Gets together: Patient refused    Attends religious service: Patient refused    Active member of club or organization: Patient refused    Attends meetings of clubs or organizations: Patient refused    Relationship status: Married  . Intimate partner violence:    Fear of current or ex partner: No    Emotionally abused: No    Physically abused: No    Forced sexual activity: No  Other Topics Concern  . Not on file  Social History Narrative  . Not on file     Current Outpatient Medications:  .  acetaminophen (TYLENOL) 650 MG CR tablet, Take 1,300 mg by mouth every 8 (eight) hours as needed for pain., Disp: , Rfl:  .  amLODipine (NORVASC) 5 MG tablet, Take 1 tablet (5 mg total) by mouth 1 day or 1 dose. (Patient taking differently: Take 5 mg by mouth daily. ), Disp: 90 tablet, Rfl: 1 .  anastrozole (ARIMIDEX) 1 MG tablet, TAKE 1 TABLET BY MOUTH ONCE DAILY, Disp: 90 tablet, Rfl: 3 .  aspirin 81 MG tablet, Take 81 mg by mouth daily., Disp: , Rfl:  .  CALCIUM-MAGNESIUM PO, Take 1 tablet by mouth daily., Disp: , Rfl:  .  cetirizine (ZYRTEC) 10 MG tablet, Take 10 mg by mouth daily., Disp: , Rfl:  .  Cholecalciferol (VITAMIN D-3 PO), Take 1 tablet by mouth daily., Disp: , Rfl:  .  denosumab (PROLIA) 60 MG/ML SOSY injection, Inject 60 mg into the skin every 6 (six) months., Disp: , Rfl:  .  meloxicam (MOBIC) 15 MG tablet, Take 1 tablet  (15 mg total) by mouth daily., Disp: 90 tablet, Rfl: 1 .  metFORMIN (GLUCOPHAGE-XR) 750 MG 24 hr tablet, Take 1 tablet (750 mg total) by mouth daily with breakfast., Disp: 90 tablet, Rfl: 1 .  metoprolol succinate (TOPROL-XL) 50 MG 24 hr tablet, Take 1 tablet (50 mg total) by mouth daily., Disp: 90 tablet, Rfl: 1 .  Multiple Vitamin (MULTIVITAMIN) tablet, Take 1 tablet by mouth daily., Disp: , Rfl:  .  nystatin (MYCOSTATIN/NYSTOP) powder, Apply topically 2 (two) times daily., Disp: , Rfl:  .  pantoprazole (PROTONIX) 40 MG tablet, Take 1 tablet (40 mg total) by mouth daily.,  Disp: 90 tablet, Rfl: 1  Allergies  Allergen Reactions  . Shrimp [Shellfish Allergy] Hives  . Augmentin [Amoxicillin-Pot Clavulanate] Nausea Only    I personally reviewed active problem list, medication list, allergies, notes from last encounter, lab results with the patient/caregiver today.   ROS  Ten systems reviewed and is negative except as mentioned in HPI.  Objective  Vitals:   12/06/18 1322  BP: 138/78  Pulse: 100  Resp: 14  Temp: 98.6 F (37 C)  TempSrc: Oral  SpO2: 95%  Weight: 208 lb 6.4 oz (94.5 kg)  Height: 5\' 7"  (1.702 m)   Body mass index is 32.64 kg/m.  Physical Exam Vitals signs and nursing note reviewed.  Constitutional:      Appearance: She is well-developed.  HENT:     Head: Normocephalic and atraumatic.     Right Ear: Tympanic membrane, ear canal and external ear normal.     Left Ear: Tympanic membrane, ear canal and external ear normal.     Nose: Nose normal.     Mouth/Throat:     Mouth: Mucous membranes are moist.     Pharynx: Oropharynx is clear. No oropharyngeal exudate or posterior oropharyngeal erythema.     Tonsils: No tonsillar exudate.  Eyes:     General: No scleral icterus.    Conjunctiva/sclera: Conjunctivae normal.     Pupils: Pupils are equal, round, and reactive to light.  Neck:     Musculoskeletal: Normal range of motion and neck supple.  Cardiovascular:      Rate and Rhythm: Normal rate and regular rhythm.     Heart sounds: Normal heart sounds. No murmur.  Pulmonary:     Effort: Pulmonary effort is normal.     Breath sounds: Normal breath sounds.  Musculoskeletal: Normal range of motion.  Skin:    General: Skin is warm and dry.     Findings: No erythema or rash.  Neurological:     General: No focal deficit present.     Mental Status: She is alert and oriented to person, place, and time.     Cranial Nerves: No cranial nerve deficit.  Psychiatric:        Behavior: Behavior normal.        Thought Content: Thought content normal.        Judgment: Judgment normal.    Results for orders placed or performed during the hospital encounter of 12/04/18 (from the past 72 hour(s))  CBC with Differential/Platelet     Status: Abnormal   Collection Time: 12/04/18  9:06 AM  Result Value Ref Range   WBC 9.8 4.0 - 10.5 K/uL   RBC 3.55 (L) 3.87 - 5.11 MIL/uL   Hemoglobin 10.8 (L) 12.0 - 15.0 g/dL   HCT 33.0 (L) 36.0 - 46.0 %   MCV 93.0 80.0 - 100.0 fL   MCH 30.4 26.0 - 34.0 pg   MCHC 32.7 30.0 - 36.0 g/dL   RDW 14.2 11.5 - 15.5 %   Platelets 169 150 - 400 K/uL   nRBC 0.0 0.0 - 0.2 %   Neutrophils Relative % 59 %   Neutro Abs 5.8 1.7 - 7.7 K/uL   Lymphocytes Relative 28 %   Lymphs Abs 2.8 0.7 - 4.0 K/uL   Monocytes Relative 11 %   Monocytes Absolute 1.1 (H) 0.1 - 1.0 K/uL   Eosinophils Relative 2 %   Eosinophils Absolute 0.2 0.0 - 0.5 K/uL   Basophils Relative 0 %   Basophils Absolute 0.0 0.0 -  0.1 K/uL   Immature Granulocytes 0 %   Abs Immature Granulocytes 0.03 0.00 - 0.07 K/uL    Comment: Performed at [facility], [address] [place], [place] [address]  Comprehensive metabolic panel     Status: Abnormal   Collection Time: 12/04/18  9:06 AM  Result Value Ref Range   Sodium 138 135 - 145 mmol/L   Potassium 3.9 3.5 - 5.1 mmol/L   Chloride 109 98 - 111 mmol/L   CO2 25 22 - 32 mmol/L   Glucose, Bld 114 (H) 70 - 99 mg/dL    BUN 14 8 - 23 mg/dL   Creatinine, Ser 0.67 0.44 - 1.00 mg/dL   Calcium 7.7 (L) 8.9 - 10.3 mg/dL   Total Protein 6.3 (L) 6.5 - 8.1 g/dL   Albumin 3.3 (L) 3.5 - 5.0 g/dL   AST 17 15 - 41 U/L   ALT 17 0 - 44 U/L   Alkaline Phosphatase 47 38 - 126 U/L   Total Bilirubin 0.7 0.3 - 1.2 mg/dL   GFR calc non Af Amer >60 >60 mL/min   GFR calc Af Amer >60 >60 mL/min   Anion gap 4 (L) 5 - 15    Comment: Performed at [facility], [address] [place], [place] [address]  Troponin I - ONCE - STAT     Status: None   Collection Time: 12/04/18  9:06 AM  Result Value Ref Range   Troponin I <0.03 <0.03 ng/mL    Comment: Performed at [facility], [address] [place], [place] [address]  Urinalysis, Complete w Microscopic     Status: Abnormal   Collection Time: 12/04/18  9:06 AM  Result Value Ref Range   Color, Urine YELLOW (A) YELLOW   APPearance CLEAR (A) CLEAR   Specific Gravity, Urine 1.009 1.005 - 1.030   pH 6.0 5.0 - 8.0   Glucose, UA NEGATIVE NEGATIVE mg/dL   Hgb urine dipstick NEGATIVE NEGATIVE   Bilirubin Urine NEGATIVE NEGATIVE   Ketones, ur NEGATIVE NEGATIVE mg/dL   Protein, ur NEGATIVE NEGATIVE mg/dL   Nitrite NEGATIVE NEGATIVE   Leukocytes, UA TRACE (A) NEGATIVE   WBC, UA 0-5 0 - 5 WBC/hpf   Bacteria, UA NONE SEEN NONE SEEN   Squamous Epithelial / LPF 0-5 0 - 5   Hyaline Casts, UA PRESENT     Comment: Performed at [facility], [address] [place], [place] [address]  Fibrin derivatives D-[facility] only)     Status: Abnormal   Collection Time: 12/04/18  9:06 AM  Result Value Ref Range   Fibrin derivatives D-dimer ([facility]) 618.40 (H) 0.00 - 499.00 ng/mL (FEU)    Comment: (NOTE) <> Exclusion of Venous Thromboembolism (VTE) - OUTPATIENT ONLY   (Emergency Department or [place])   0-499 ng/ml (FEU): With a low to intermediate pretest probability                      for VTE this test result excludes the diagnosis                      of VTE.    >499 ng/ml (FEU) : VTE not excluded; additional work up for VTE is                      required. <> Testing on Inpatients and Evaluation of Disseminated Intravascular   Coagulation (DIC) Reference Range:   0-499 ng/ml (  FEU) Performed at [facility], [address] [place], [place] [address]   Troponin I - Once-Timed     Status: None   Collection Time: 12/04/18 12:04 PM  Result Value Ref Range   Troponin I <0.03 <0.03 ng/mL    Comment: Performed at [facility], [address] [place], [place] [address]   PHQ2/9: Depression screen [facility] 2/9 12/06/2018 07/03/2018 12/25/2017 12/25/2017 07/03/2017  Decreased Interest 0 0 0 1 0  Down, Depressed, Hopeless 0 0 0 1 0  PHQ - 2 Score 0 0 0 2 0  Altered sleeping 0 0 - 0 -  Tired, decreased energy 0 0 - 0 -  Change in appetite 0 0 - 0 -  Feeling bad or failure about yourself  0 0 - 0 -  Trouble concentrating 0 0 - 0 -  Moving slowly or fidgety/restless 0 0 - 0 -  Suicidal thoughts 0 0 - 0 -  PHQ-9 Score 0 0 - 2 -  Difficult doing work/chores Not difficult at all Not difficult at all - Not difficult at all -    Fall Risk: Fall Risk  12/06/2018 07/03/2018 12/25/2017 07/03/2017 10/31/2016  Falls in the past year? 0 No No No No  Number falls in past yr: 0 - - - -  Injury with Fall? 0 - - - -  Follow up Falls evaluation completed - - - -   Assessment & Plan  1. Syncope, unspecified syncope type - COMPLETE METABOLIC PANEL WITH GFR - CBC w/Diff/Platelet  2. Hypocalcemia - COMPLETE METABOLIC PANEL WITH GFR  3. Low hemoglobin - CBC w/Diff/Platelet  4. Essential hypertension - metoprolol succinate (TOPROL-XL) 25 MG 24 hr tablet; Take 1 tablet (25 mg total) by mouth daily.  Dispense: 90 tablet; Refill: 3 - STOP Amlodine, decrease metoprolol to 25mg  daily.  5. Generalized body aches - POCT Influenza A/B  6. Viral upper respiratory tract infection - promethazine-dextromethorphan (PROMETHAZINE-DM) 6.25-15 MG/5ML syrup; Take 5 mLs  by mouth 4 (four) times daily as needed for cough.  Dispense: 118 mL; Refill: 0 - guaiFENesin (MUCINEX) 600 MG 12 hr tablet; Take 1 tablet (600 mg total) by mouth 2 (two) times daily as needed.  Dispense: 20 tablet; Refill: 0 - fluticasone (FLONASE) 50 MCG/ACT nasal spray; Place 2 sprays into both nostrils daily.  Dispense: 16 g; Refill: 0

## 2018-12-06 NOTE — Telephone Encounter (Signed)
Pt has been scheduled today with Emily at 1:20 pm

## 2018-12-06 NOTE — Telephone Encounter (Signed)
LVM for pt to call back to get scheduled with Raelyn Ensign NP for ER FU

## 2018-12-06 NOTE — Telephone Encounter (Signed)
Pt calling back to check status. Would like to know if she can be worked in today

## 2018-12-09 ENCOUNTER — Encounter: Payer: Self-pay | Admitting: Family Medicine

## 2018-12-16 DIAGNOSIS — H35051 Retinal neovascularization, unspecified, right eye: Secondary | ICD-10-CM | POA: Diagnosis not present

## 2018-12-20 ENCOUNTER — Ambulatory Visit (INDEPENDENT_AMBULATORY_CARE_PROVIDER_SITE_OTHER): Payer: Medicare Other

## 2018-12-20 VITALS — BP 140/90 | HR 90

## 2018-12-20 DIAGNOSIS — Z0130 Encounter for examination of blood pressure without abnormal findings: Secondary | ICD-10-CM

## 2018-12-20 NOTE — Progress Notes (Addendum)
Patient is here for a blood pressure check. Patient denies chest pain, palpitations, shortness of breath or visual disturbances. At previous visit blood pressure was 138/78 with a heart rate of 100. Today during nurse visit first check blood pressure was 150/100 and heart rate was 90. After resting for 10 minutes it was 140/90. She was advised to stop taking current blood medications. She reports that she has taken blood pressure meds about three times because her blood pressure was elevated.  Please ask patient to return for a follow up with me. Thank you

## 2018-12-24 ENCOUNTER — Ambulatory Visit (INDEPENDENT_AMBULATORY_CARE_PROVIDER_SITE_OTHER): Payer: Medicare Other | Admitting: Family Medicine

## 2018-12-24 ENCOUNTER — Encounter: Payer: Self-pay | Admitting: Family Medicine

## 2018-12-24 VITALS — BP 130/90 | HR 100 | Temp 97.5°F | Resp 16 | Ht 67.0 in | Wt 204.4 lb

## 2018-12-24 DIAGNOSIS — R7303 Prediabetes: Secondary | ICD-10-CM

## 2018-12-24 DIAGNOSIS — K219 Gastro-esophageal reflux disease without esophagitis: Secondary | ICD-10-CM | POA: Diagnosis not present

## 2018-12-24 DIAGNOSIS — Z87898 Personal history of other specified conditions: Secondary | ICD-10-CM | POA: Diagnosis not present

## 2018-12-24 DIAGNOSIS — R05 Cough: Secondary | ICD-10-CM | POA: Diagnosis not present

## 2018-12-24 DIAGNOSIS — E66811 Obesity, class 1: Secondary | ICD-10-CM

## 2018-12-24 DIAGNOSIS — C8193 Hodgkin lymphoma, unspecified, intra-abdominal lymph nodes: Secondary | ICD-10-CM

## 2018-12-24 DIAGNOSIS — E669 Obesity, unspecified: Secondary | ICD-10-CM | POA: Diagnosis not present

## 2018-12-24 DIAGNOSIS — R058 Other specified cough: Secondary | ICD-10-CM

## 2018-12-24 DIAGNOSIS — I10 Essential (primary) hypertension: Secondary | ICD-10-CM | POA: Diagnosis not present

## 2018-12-24 MED ORDER — AMLODIPINE BESYLATE 5 MG PO TABS: 5.0000 mg | ORAL_TABLET | Freq: Every day | ORAL | 0 refills | Status: DC

## 2018-12-24 MED ORDER — METFORMIN HCL ER 750 MG PO TB24: 750.0000 mg | ORAL_TABLET | Freq: Every day | ORAL | 1 refills | Status: DC

## 2018-12-24 MED ORDER — PROMETHAZINE-DM 6.25-15 MG/5ML PO SYRP: 5.0000 mL | ORAL_SOLUTION | Freq: Four times a day (QID) | ORAL | 0 refills | Status: DC | PRN

## 2018-12-24 MED ORDER — AZITHROMYCIN 250 MG PO TABS: ORAL_TABLET | ORAL | 0 refills | Status: DC

## 2018-12-24 MED ORDER — PANTOPRAZOLE SODIUM 40 MG PO TBEC: 40.0000 mg | DELAYED_RELEASE_TABLET | Freq: Every day | ORAL | 1 refills | Status: DC

## 2018-12-24 MED ORDER — METOPROLOL SUCCINATE ER 50 MG PO TB24: 50.0000 mg | ORAL_TABLET | Freq: Every day | ORAL | 0 refills | Status: DC

## 2018-12-24 NOTE — Progress Notes (Signed)
Name: Mary Todd   MRN: [id number]    DOB: [birth year]   Date:12/24/2018       Progress Note  Subjective  Chief Complaint  Chief Complaint  Patient presents with  . Hypertension    follow up from nurse visit last week.    HPI  Pre-diabetes: she denies polyphagia, polydipsia or polyuria, she tolerates metformin well, denies diarrhea   Urge incontinence: seldom has episodes, she states usually when sitting and has an urge to go, when she stands up can have an accident. Discussed PT. She states she is trying to wait too long to use bathroom, not interested in having PT at this time   HTN: taking medication as prescribed, she had been off Norvasc because of episode of syncope ( likely from dehydration) but is back taking Norvasc 5 mg and on lower dose of toprol xl 25 mg, we will go back to 50 mg today since bp at home has been 140's/80's and elevated today, heart rate is 100  GERD:she tried stopping Pantoprazole but as soon as she went off medication symptoms became severe, with heartburn, and regurgitation, and has been taking it daily again. She is aware of long risk of PPI.   OA: taking Meloxicam daily, explained that it can make GERD worse, she tried stopping Meloxicam but pain was too intense, she states her whole body ached, so she went back to once daily. She is also taking Tylenol, she sees Ortho and is thinking about having knee replacement surgery. She states she is her father's primary caregiver and may have to get it done in the Summer   Right Breast Cancer: under the care of Dr. Mike Gip and now Dr. Fleet Contras. Also diagnosed with hodgkin's lymphoma in 2004, breast cancer diagnosed in 2011   still on Arimidex until 2021 . Had to start Prolia for osteoporosis, found on bone density of spine, she is tolerating medications well, last mammogram was normal- Fall 2019 . No changes   Thyroid nodules: incidental finding on CT neck , had biopsy by Dr. Gabriel Carina Dec 2017 and  biopsy was negative, still has yearly follow ups with her. .No dysphagia, constipation, denies dry skin.Unchanged , still sees her yearly   Obesity : she has long history of obesity, she ison Metformin, she is doing well, Dr. Gabriel Carina gave her phentermine to curb her appetite but is off medication at this time. Decreasing portion size.   Patient Active Problem List   Diagnosis Date Noted  . Encounter for screening mammogram for breast cancer 07/02/2018  . Anterolisthesis 08/06/2017  . DDD (degenerative disc disease), lumbosacral 08/06/2017  . Chondromalacia patellae 07/03/2017  . Osteoporosis 12/14/2016  . Morbid obesity ([place]) 07/17/2016  . Spinal stenosis in cervical region 05/22/2016  . Thyroid nodule 05/22/2016  . GERD without esophagitis 03/16/2016  . Neck muscle spasm 03/16/2016  . History of back surgery 03/16/2016  . Osteoarthritis of both knees 03/16/2016  . Large breasts 03/16/2016  . Hypertension, benign 03/16/2016  . Perennial allergic rhinitis with seasonal variation 03/16/2016  . History of pneumococcal pneumonia 09/20/2015  . Hodgkin disease ([place]) 07/15/2014  . Breast cancer, right ([place]) 06/26/2013    Past Surgical History:  Procedure Laterality Date  . ABDOMINAL HYSTERECTOMY    . BACK SURGERY    . BREAST BIOPSY Right 2011   radiation  . BREAST LUMPECTOMY Right 2011   w/ radiation  . BREAST SURGERY Right 2011   right breast lumpectomy,L/SN/R for right breast CA   .  CATARACT EXTRACTION W/PHACO Left 06/10/2018   Procedure: CATARACT EXTRACTION PHACO AND INTRAOCULAR LENS PLACEMENT ([place]) LEFT;  Surgeon: Eulogio Bear, MD;  Location: [facility];  Service: Ophthalmology;  Laterality: Left;  Diabetic - oral meds  . CATARACT EXTRACTION W/PHACO Right 07/15/2018   Procedure: CATARACT EXTRACTION PHACO AND INTRAOCULAR LENS PLACEMENT ([place]) RIGHT;  Surgeon: Eulogio Bear, MD;  Location: [facility];  Service: Ophthalmology;  Laterality: Right;  .  COLONOSCOPY  2003  . PORT A CATH REVISION    . WRIST SURGERY  2005    Family History  Problem Relation Age of Onset  . Cancer Father   . Bone cancer Father   . Breast cancer Mother 21  . Stroke Mother   . Colon cancer Maternal Grandmother     Social History   Socioeconomic History  . Marital status: Married    Spouse name: [name]  . Number of children: 2  . Years of education: some college  . Highest education level: 12th grade  Occupational History  . Occupation: Retired  Scientific laboratory technician  . Financial resource strain: Not hard at all  . Food insecurity:    Worry: Never true    Inability: Never true  . Transportation needs:    Medical: No    Non-medical: No  Tobacco Use  . Smoking status: Never Smoker  . Smokeless tobacco: Never Used  . Tobacco comment: smoking cessation materials not required  Substance and Sexual Activity  . Alcohol use: Yes    Alcohol/week: 2.0 standard drinks    Types: 2 Glasses of wine per week    Comment: occassionally  . Drug use: No  . Sexual activity: Not Currently  Lifestyle  . Physical activity:    Days per week: 0 days    Minutes per session: 0 min  . Stress: Not at all  Relationships  . Social connections:    Talks on phone: Patient refused    Gets together: Patient refused    Attends religious service: Patient refused    Active member of club or organization: Patient refused    Attends meetings of clubs or organizations: Patient refused    Relationship status: Married  . Intimate partner violence:    Fear of current or ex partner: No    Emotionally abused: No    Physically abused: No    Forced sexual activity: No  Other Topics Concern  . Not on file  Social History Narrative  . Not on file     Current Outpatient Medications:  .  acetaminophen (TYLENOL) 650 MG CR tablet, Take 1,300 mg by mouth every 8 (eight) hours as needed for pain., Disp: , Rfl:  .  anastrozole (ARIMIDEX) 1 MG tablet, TAKE 1 TABLET BY MOUTH ONCE  DAILY, Disp: 90 tablet, Rfl: 3 .  aspirin 81 MG tablet, Take 81 mg by mouth daily., Disp: , Rfl:  .  CALCIUM-MAGNESIUM PO, Take 1 tablet by mouth daily., Disp: , Rfl:  .  cetirizine (ZYRTEC) 10 MG tablet, Take 10 mg by mouth daily., Disp: , Rfl:  .  Cholecalciferol (VITAMIN D-3 PO), Take 1 tablet by mouth daily., Disp: , Rfl:  .  denosumab (PROLIA) 60 MG/ML SOSY injection, Inject 60 mg into the skin every 6 (six) months., Disp: , Rfl:  .  fluticasone (FLONASE) 50 MCG/ACT nasal spray, Place 2 sprays into both nostrils daily., Disp: 16 g, Rfl: 0 .  meloxicam (MOBIC) 15 MG tablet, Take 1 tablet (15 mg total) by  mouth daily., Disp: 90 tablet, Rfl: 1 .  metFORMIN (GLUCOPHAGE-XR) 750 MG 24 hr tablet, Take 1 tablet (750 mg total) by mouth daily with breakfast., Disp: 90 tablet, Rfl: 1 .  metoprolol succinate (TOPROL-XL) 50 MG 24 hr tablet, Take 1 tablet (50 mg total) by mouth daily., Disp: 90 tablet, Rfl: 0 .  Multiple Vitamin (MULTIVITAMIN) tablet, Take 1 tablet by mouth daily., Disp: , Rfl:  .  nystatin (MYCOSTATIN/NYSTOP) powder, Apply topically 2 (two) times daily., Disp: , Rfl:  .  pantoprazole (PROTONIX) 40 MG tablet, Take 1 tablet (40 mg total) by mouth daily., Disp: 90 tablet, Rfl: 1 .  amLODipine (NORVASC) 5 MG tablet, Take 1 tablet (5 mg total) by mouth daily., Disp: 90 tablet, Rfl: 0 .  azithromycin (ZITHROMAX) 250 MG tablet, Take as directed, Disp: 6 tablet, Rfl: 0 .  promethazine-dextromethorphan (PROMETHAZINE-DM) 6.25-15 MG/5ML syrup, Take 5 mLs by mouth 4 (four) times daily as needed for cough., Disp: 118 mL, Rfl: 0  Allergies  Allergen Reactions  . Shrimp [Shellfish Allergy] Hives  . Augmentin [Amoxicillin-Pot Clavulanate] Nausea Only    I personally reviewed active problem list, medication list, allergies, family history, social history with the patient/caregiver today.   ROS  Constitutional: Negative for fever or weight change.  Respiratory: positive  for cough and shortness  of breath while coughing .   Cardiovascular: Negative for chest pain or palpitations.  Gastrointestinal: Negative for abdominal pain, no bowel changes.  Musculoskeletal: Negative for gait problem or joint swelling.  Skin: Negative for rash.  Neurological: Negative for dizziness or headache.  No other specific complaints in a complete review of systems (except as listed in HPI above).  Objective  Vitals:   12/24/18 0948  BP: (!) 150/90  Pulse: 100  Resp: 16  Temp: (!) 97.5 F (36.4 C)  TempSrc: Oral  SpO2: 97%  Weight: 204 lb 6.4 oz (92.7 kg)  Height: 5\' 7"  (1.702 m)    Body mass index is 32.01 kg/m.  Physical Exam  Constitutional: Patient appears well-developed and well-nourished. Obese  No distress.  HEENT: head atraumatic, normocephalic, pupils equal and reactive to light, neck supple, throat within normal limits Cardiovascular: Normal rate, regular rhythm and normal heart sounds.  No murmur heard. No BLE edema. Pulmonary/Chest: Effort normal and breath sounds normal. No respiratory distress. Abdominal: Soft.  There is no tenderness. Psychiatric: Patient has a normal mood and affect. behavior is normal. Judgment and thought content normal.  Recent Results (from the past 2160 hour(s))  CBC with Differential/Platelet     Status: Abnormal   Collection Time: 12/04/18  9:06 AM  Result Value Ref Range   WBC 9.8 4.0 - 10.5 K/uL   RBC 3.55 (L) 3.87 - 5.11 MIL/uL   Hemoglobin 10.8 (L) 12.0 - 15.0 g/dL   HCT 33.0 (L) 36.0 - 46.0 %   MCV 93.0 80.0 - 100.0 fL   MCH 30.4 26.0 - 34.0 pg   MCHC 32.7 30.0 - 36.0 g/dL   RDW 14.2 11.5 - 15.5 %   Platelets 169 150 - 400 K/uL   nRBC 0.0 0.0 - 0.2 %   Neutrophils Relative % 59 %   Neutro Abs 5.8 1.7 - 7.7 K/uL   Lymphocytes Relative 28 %   Lymphs Abs 2.8 0.7 - 4.0 K/uL   Monocytes Relative 11 %   Monocytes Absolute 1.1 (H) 0.1 - 1.0 K/uL   Eosinophils Relative 2 %   Eosinophils Absolute 0.2 0.0 - 0.5 K/uL  Basophils Relative 0  %   Basophils Absolute 0.0 0.0 - 0.1 K/uL   Immature Granulocytes 0 %   Abs Immature Granulocytes 0.03 0.00 - 0.07 K/uL    Comment: Performed at [facility], [address] [place], [place] [address]  Comprehensive metabolic panel     Status: Abnormal   Collection Time: 12/04/18  9:06 AM  Result Value Ref Range   Sodium 138 135 - 145 mmol/L   Potassium 3.9 3.5 - 5.1 mmol/L   Chloride 109 98 - 111 mmol/L   CO2 25 22 - 32 mmol/L   Glucose, Bld 114 (H) 70 - 99 mg/dL   BUN 14 8 - 23 mg/dL   Creatinine, Ser 0.67 0.44 - 1.00 mg/dL   Calcium 7.7 (L) 8.9 - 10.3 mg/dL   Total Protein 6.3 (L) 6.5 - 8.1 g/dL   Albumin 3.3 (L) 3.5 - 5.0 g/dL   AST 17 15 - 41 U/L   ALT 17 0 - 44 U/L   Alkaline Phosphatase 47 38 - 126 U/L   Total Bilirubin 0.7 0.3 - 1.2 mg/dL   GFR calc non Af Amer >60 >60 mL/min   GFR calc Af Amer >60 >60 mL/min   Anion gap 4 (L) 5 - 15    Comment: Performed at [facility], [address] [place], [place] [address]  Troponin I - ONCE - STAT     Status: None   Collection Time: 12/04/18  9:06 AM  Result Value Ref Range   Troponin I <0.03 <0.03 ng/mL    Comment: Performed at [facility], [address] [place], [place] [address]  Urinalysis, Complete w Microscopic     Status: Abnormal   Collection Time: 12/04/18  9:06 AM  Result Value Ref Range   Color, Urine YELLOW (A) YELLOW   APPearance CLEAR (A) CLEAR   Specific Gravity, Urine 1.009 1.005 - 1.030   pH 6.0 5.0 - 8.0   Glucose, UA NEGATIVE NEGATIVE mg/dL   Hgb urine dipstick NEGATIVE NEGATIVE   Bilirubin Urine NEGATIVE NEGATIVE   Ketones, ur NEGATIVE NEGATIVE mg/dL   Protein, ur NEGATIVE NEGATIVE mg/dL   Nitrite NEGATIVE NEGATIVE   Leukocytes, UA TRACE (A) NEGATIVE   WBC, UA 0-5 0 - 5 WBC/hpf   Bacteria, UA NONE SEEN NONE SEEN   Squamous Epithelial / LPF 0-5 0 - 5   Hyaline Casts, UA PRESENT     Comment: Performed at [facility], [address], [place], [place]  [address]  Fibrin derivatives D-Dimer [facility] only)     Status: Abnormal   Collection Time: 12/04/18  9:06 AM  Result Value Ref Range   Fibrin derivatives D-dimer ([facility]) 618.40 (H) 0.00 - 499.00 ng/mL (FEU)    Comment: (NOTE) <> Exclusion of Venous Thromboembolism (VTE) - OUTPATIENT ONLY   (Emergency Department or [place])   0-499 ng/ml (FEU): With a low to intermediate pretest probability                      for VTE this test result excludes the diagnosis                      of VTE.   >499 ng/ml (FEU) : VTE not excluded; additional work up for VTE is                      required. <> Testing on Inpatients and Evaluation of Disseminated  Intravascular   Coagulation (DIC) Reference Range:   0-499 ng/ml (FEU) Performed at [facility], [address] [place], [place] [address]   Troponin I - Once-Timed     Status: None   Collection Time: 12/04/18 12:04 PM  Result Value Ref Range   Troponin I <0.03 <0.03 ng/mL    Comment: Performed at [facility], [address] [place], [place] [address]  POCT Influenza A/B     Status: None   Collection Time: 12/06/18  2:13 PM  Result Value Ref Range   Influenza A, POC Negative Negative   Influenza B, POC Negative Negative  COMPLETE METABOLIC PANEL WITH GFR     Status: None   Collection Time: 12/06/18  2:17 PM  Result Value Ref Range   Glucose, Bld 83 65 - 99 mg/dL    Comment: .            Fasting reference interval .    BUN 11 7 - 25 mg/dL   Creat 0.68 0.60 - 0.93 mg/dL    Comment: For patients >43 years of age, the reference limit for Creatinine is approximately 13% higher for people identified as African-American. .    GFR, Est Non African American 88 > OR = 60 mL/min/1.60m2   GFR, Est African American 102 > OR = 60 mL/min/1.55m2   BUN/Creatinine Ratio NOT APPLICABLE 6 - 22 (calc)   Sodium 139 135 - 146 mmol/L   Potassium 4.5 3.5 - 5.3 mmol/L   Chloride 103 98 - 110 mmol/L   CO2 27 20 - 32 mmol/L   Calcium 9.2 8.6 -  10.4 mg/dL   Total Protein 7.2 6.1 - 8.1 g/dL   Albumin 4.1 3.6 - 5.1 g/dL   Globulin 3.1 1.9 - 3.7 g/dL (calc)   AG Ratio 1.3 1.0 - 2.5 (calc)   Total Bilirubin 0.5 0.2 - 1.2 mg/dL   Alkaline phosphatase (APISO) 76 37 - 153 U/L   AST 16 10 - 35 U/L   ALT 18 6 - 29 U/L  CBC w/Diff/Platelet     Status: None   Collection Time: 12/06/18  2:17 PM  Result Value Ref Range   WBC 8.6 3.8 - 10.8 Thousand/uL   RBC 4.25 3.80 - 5.10 Million/uL   Hemoglobin 13.3 11.7 - 15.5 g/dL   HCT 38.1 35.0 - 45.0 %   MCV 89.6 80.0 - 100.0 fL   MCH 31.3 27.0 - 33.0 pg   MCHC 34.9 32.0 - 36.0 g/dL   RDW 13.1 11.0 - 15.0 %   Platelets 227 140 - 400 Thousand/uL   MPV 11.3 7.5 - 12.5 fL   Neutro Abs 4,816 1,500 - 7,800 cells/uL   Lymphs Abs 2,528 850 - 3,900 cells/uL   Absolute Monocytes 808 200 - 950 cells/uL   Eosinophils Absolute 396 15 - 500 cells/uL   Basophils Absolute 52 0 - 200 cells/uL   Neutrophils Relative % 56 %   Total Lymphocyte 29.4 %   Monocytes Relative 9.4 %   Eosinophils Relative 4.6 %   Basophils Relative 0.6 %      PHQ2/9: Depression screen [facility] 2/9 12/24/2018 12/06/2018 07/03/2018 12/25/2017 12/25/2017  Decreased Interest 0 0 0 0 1  Down, Depressed, Hopeless 0 0 0 0 1  PHQ - 2 Score 0 0 0 0 2  Altered sleeping - 0 0 - 0  Tired, decreased energy - 0 0 - 0  Change in appetite - 0 0 - 0  Feeling bad  or failure about yourself  - 0 0 - 0  Trouble concentrating - 0 0 - 0  Moving slowly or fidgety/restless - 0 0 - 0  Suicidal thoughts - 0 0 - 0  PHQ-9 Score - 0 0 - 2  Difficult doing work/chores - Not difficult at all Not difficult at all - Not difficult at all     Fall Risk: Fall Risk  12/24/2018 12/06/2018 07/03/2018 12/25/2017 07/03/2017  Falls in the past year? 0 0 No No No  Number falls in past yr: 0 0 - - -  Injury with Fall? 0 0 - - -  Follow up - Falls evaluation completed - - -      Assessment & Plan  1. Essential hypertension  bp still elevated  - amLODipine (NORVASC) 5  MG tablet; Take 1 tablet (5 mg total) by mouth daily.  Dispense: 90 tablet; Refill: 0 - metoprolol succinate (TOPROL-XL) 50 MG 24 hr tablet; Take 1 tablet (50 mg total) by mouth daily.  Dispense: 90 tablet; Refill: 0  2. Cough productive of yellow sputum  - promethazine-dextromethorphan (PROMETHAZINE-DM) 6.25-15 MG/5ML syrup; Take 5 mLs by mouth 4 (four) times daily as needed for cough.  Dispense: 118 mL; Refill: 0 - azithromycin (ZITHROMAX) 250 MG tablet; Take as directed  Dispense: 6 tablet; Refill: 0  3. Pre-diabetes  - metFORMIN (GLUCOPHAGE-XR) 750 MG 24 hr tablet; Take 1 tablet (750 mg total) by mouth daily with breakfast.  Dispense: 90 tablet; Refill: 1  4. History of syncope  Likely from dehydration   5. Hodgkin lymphoma of intra-abdominal lymph nodes, unspecified Hodgkin lymphoma type ([place])  Keep follow up with hematologist every 6 months   6. GERD without esophagitis  - pantoprazole (PROTONIX) 40 MG tablet; Take 1 tablet (40 mg total) by mouth daily.  Dispense: 90 tablet; Refill: 1  7. Obesity (BMI 30.0-34.9)  - metFORMIN (GLUCOPHAGE-XR) 750 MG 24 hr tablet; Take 1 tablet (750 mg total) by mouth daily with breakfast.  Dispense: 90 tablet; Refill: 1

## 2018-12-27 ENCOUNTER — Ambulatory Visit (INDEPENDENT_AMBULATORY_CARE_PROVIDER_SITE_OTHER): Payer: Medicare Other

## 2018-12-27 VITALS — BP 132/82 | HR 94 | Temp 98.0°F | Resp 16 | Ht 67.0 in | Wt 210.5 lb

## 2018-12-27 DIAGNOSIS — Z0000 Encounter for general adult medical examination without abnormal findings: Secondary | ICD-10-CM | POA: Diagnosis not present

## 2018-12-27 NOTE — Progress Notes (Signed)
Subjective:   Mary Todd is a 73 y.o. female who presents for Medicare Annual (Subsequent) preventive examination.  Review of Systems:   Cardiac Risk Factors include: obesity (BMI >30kg/m2);advanced age (>21men, >72 women);hypertension(prediabetes)     Objective:     Vitals: BP 132/82 (BP Location: Right Arm, Patient Position: Sitting, Cuff Size: Normal)   Pulse 94   Temp 98 F (36.7 C) (Oral)   Resp 16   Ht 5\' 7"  (1.702 m)   Wt 210 lb 8 oz (95.5 kg)   SpO2 98%   BMI 32.97 kg/m   Body mass index is 32.97 kg/m.  Advanced Directives 12/27/2018 12/04/2018 07/31/2018 07/15/2018 06/10/2018 01/30/2018 12/25/2017  Does Patient Have a Medical Advance Directive? Yes No No No;Yes No No No  Type of Advance Directive Living will;Healthcare Power of [place];Living will - - -  Does patient want to make changes to medical advance directive? - - - No - Patient declined - - -  Copy of [place] in Chart? No - copy requested - - No - copy requested - - -  Would patient like information on creating a medical advance directive? - - - No - Patient declined No - Patient declined - Yes (MAU/Ambulatory/Procedural Areas - Information given)    Tobacco Social History   Tobacco Use  Smoking Status Never Smoker  Smokeless Tobacco Never Used  Tobacco Comment   smoking cessation materials not required     Counseling given: Not Answered Comment: smoking cessation materials not required   Clinical Intake:  Pre-visit preparation completed: Yes  Pain : No/denies pain     BMI - recorded: 32.97 Nutritional Status: BMI > 30  Obese Nutritional Risks: None Diabetes: No  How often do you need to have someone help you when you read instructions, pamphlets, or other written materials from your doctor or pharmacy?: 1 - Never What is the last grade level you completed in school?: 12th grade  Interpreter Needed?: No  Information entered by  :: Clemetine Marker LPN  Past Medical History:  Diagnosis Date  . Allergy   . Arthritis    knees, lower back  . Breast cancer ([place]) right   2011, T1A N0, radiation  . Esophageal reflux   . Hodgkin's disease ([place]) 2004  . Hypertension 2006  . Osteoporosis   . Personal history of malignant neoplasm of breast 2011   right breast  . Personal history of radiation therapy 2011   RIGHT lumpectomy w/ radiation   Past Surgical History:  Procedure Laterality Date  . ABDOMINAL HYSTERECTOMY    . BACK SURGERY    . BREAST BIOPSY Right 2011   radiation  . BREAST LUMPECTOMY Right 2011   w/ radiation  . BREAST SURGERY Right 2011   right breast lumpectomy,L/SN/R for right breast CA   . CATARACT EXTRACTION W/PHACO Left 06/10/2018   Procedure: CATARACT EXTRACTION PHACO AND INTRAOCULAR LENS PLACEMENT ([place]) LEFT;  Surgeon: Eulogio Bear, MD;  Location: [facility];  Service: Ophthalmology;  Laterality: Left;  Diabetic - oral meds  . CATARACT EXTRACTION W/PHACO Right 07/15/2018   Procedure: CATARACT EXTRACTION PHACO AND INTRAOCULAR LENS PLACEMENT ([place]) RIGHT;  Surgeon: Eulogio Bear, MD;  Location: [facility];  Service: Ophthalmology;  Laterality: Right;  . COLONOSCOPY  2003  . PORT A CATH REVISION    . WRIST SURGERY  2005   Family History  Problem Relation Age of Onset  . Cancer  Father   . Bone cancer Father   . Breast cancer Mother 26  . Stroke Mother   . Colon cancer Maternal Grandmother   . Breast cancer Cousin    Social History   Socioeconomic History  . Marital status: Married    Spouse name: [name]  . Number of children: 2  . Years of education: some college  . Highest education level: 12th grade  Occupational History  . Occupation: Retired  Scientific laboratory technician  . Financial resource strain: Not hard at all  . Food insecurity:    Worry: Never true    Inability: Never true  . Transportation needs:    Medical: No    Non-medical: No  Tobacco Use  . Smoking  status: Never Smoker  . Smokeless tobacco: Never Used  . Tobacco comment: smoking cessation materials not required  Substance and Sexual Activity  . Alcohol use: Yes    Alcohol/week: 2.0 standard drinks    Types: 2 Glasses of wine per week    Comment: occassionally  . Drug use: No  . Sexual activity: Not Currently  Lifestyle  . Physical activity:    Days per week: 0 days    Minutes per session: 0 min  . Stress: Not at all  Relationships  . Social connections:    Talks on phone: More than three times a week    Gets together: More than three times a week    Attends religious service: More than 4 times per year    Active member of club or organization: No    Attends meetings of clubs or organizations: Never    Relationship status: Married  Other Topics Concern  . Not on file  Social History Narrative  . Not on file    Outpatient Encounter Medications as of 12/27/2018  Medication Sig  . acetaminophen (TYLENOL) 650 MG CR tablet Take 1,300 mg by mouth every 8 (eight) hours as needed for pain.  Marland Kitchen amLODipine (NORVASC) 5 MG tablet Take 1 tablet (5 mg total) by mouth daily.  Marland Kitchen anastrozole (ARIMIDEX) 1 MG tablet TAKE 1 TABLET BY MOUTH ONCE DAILY  . aspirin 81 MG tablet Take 81 mg by mouth daily.  Marland Kitchen azithromycin (ZITHROMAX) 250 MG tablet Take as directed  . CALCIUM-MAGNESIUM PO Take 1 tablet by mouth daily.  . cetirizine (ZYRTEC) 10 MG tablet Take 10 mg by mouth daily.  . Cholecalciferol (VITAMIN D-3 PO) Take 1 tablet by mouth daily.  Marland Kitchen denosumab (PROLIA) 60 MG/ML SOSY injection Inject 60 mg into the skin every 6 (six) months.  . fluticasone (FLONASE) 50 MCG/ACT nasal spray Place 2 sprays into both nostrils daily.  . meloxicam (MOBIC) 15 MG tablet Take 1 tablet (15 mg total) by mouth daily.  . metFORMIN (GLUCOPHAGE-XR) 750 MG 24 hr tablet Take 1 tablet (750 mg total) by mouth daily with breakfast.  . metoprolol succinate (TOPROL-XL) 50 MG 24 hr tablet Take 1 tablet (50 mg total) by  mouth daily.  . Multiple Vitamin (MULTIVITAMIN) tablet Take 1 tablet by mouth daily.  Marland Kitchen nystatin (MYCOSTATIN/NYSTOP) powder Apply topically 2 (two) times daily.  . pantoprazole (PROTONIX) 40 MG tablet Take 1 tablet (40 mg total) by mouth daily.  . promethazine-dextromethorphan (PROMETHAZINE-DM) 6.25-15 MG/5ML syrup Take 5 mLs by mouth 4 (four) times daily as needed for cough.   No facility-administered encounter medications on file as of 12/27/2018.     Activities of Daily Living In your present state of health, do you have any difficulty performing  the following activities: 12/27/2018 07/15/2018  Hearing? N N  Comment declines hearing aids -  Vision? N N  Comment wears glasses -  Difficulty concentrating or making decisions? N N  Walking or climbing stairs? N N  Dressing or bathing? N N  Doing errands, shopping? N -  Preparing Food and eating ? N -  Using the Toilet? N -  In the past six months, have you accidently leaked urine? N -  Do you have problems with loss of bowel control? N -  Managing your Medications? N -  Managing your Finances? N -  Housekeeping or managing your Housekeeping? N -  Some recent data might be hidden    Patient Care Team: Steele Sizer, MD as PCP - General (Family Medicine) Christene Lye, MD (General Surgery) Lequita Asal, MD as Referring Physician (Hematology and Oncology) Judi Cong, MD as Consulting Physician (Endocrinology) Thornton Park, MD as Consulting Physician (Orthopedic Surgery)    Assessment:   This is a routine wellness examination for Abir.  Exercise Activities and Dietary recommendations Current Exercise Habits: The patient does not participate in regular exercise at present, Exercise limited by: None identified  Goals    . DIET - INCREASE WATER INTAKE     Recommend to drink at least 6-8 8oz glasses of water per day.    . Increase physical activity     Pt would like to start water aerobics again twice  weekly and increase physical activity overall        Fall Risk Fall Risk  12/27/2018 12/24/2018 12/06/2018 07/03/2018 12/25/2017  Falls in the past year? 0 0 0 No No  Number falls in past yr: 0 0 0 - -  Injury with Fall? 0 0 0 - -  Follow up Falls prevention discussed - Falls evaluation completed - -   FALL RISK PREVENTION PERTAINING TO THE HOME:  Any stairs in or around the home? Yes  If so, do they handrails? Yes   Home free of loose throw rugs in walkways, pet beds, electrical cords, etc? No  Adequate lighting in your home to reduce risk of falls? Yes   ASSISTIVE DEVICES UTILIZED TO PREVENT FALLS:  Life alert? No  Use of a cane, walker or w/c? No  Grab bars in the bathroom? No  Shower chair or bench in shower? No  Elevated toilet seat or a handicapped toilet? No   DME ORDERS:  DME order needed?  No   TIMED UP AND GO:  Was the test performed? Yes .  Length of time to ambulate 10 feet: 5 sec.   GAIT:  Appearance of gait: Gait stead-fast and without the use of an assistive device.  Education: Fall risk prevention has been discussed.  Intervention(s) required? No   Depression Screen PHQ 2/9 Scores 12/27/2018 12/24/2018 12/06/2018 07/03/2018  PHQ - 2 Score 0 0 0 0  PHQ- 9 Score - - 0 0     Cognitive Function     6CIT Screen 12/27/2018 12/25/2017  What Year? 0 points 0 points  What month? 0 points 0 points  What time? 0 points 0 points  Count back from 20 0 points 0 points  Months in reverse 2 points 0 points  Repeat phrase 0 points 0 points  Total Score 2 0    Immunization History  Administered Date(s) Administered  . Influenza, High Dose Seasonal PF 09/20/2015, 07/17/2016, 07/03/2017, 07/03/2018  . Influenza-Unspecified 07/30/2013  . Pneumococcal Conjugate-13 05/04/2016  . Pneumococcal  Polysaccharide-23 08/27/2007, 06/17/2013  . Tdap 09/16/2010  . Zoster 02/28/2011  . Zoster Recombinat (Shingrix) 05/30/2018, 07/29/2018    Qualifies for Shingles Vaccine? Yes   Up to date  Tdap: Up to date  Flu Vaccine: Up to date  Pneumococcal Vaccine: Up to date   Screening Tests Health Maintenance  Topic Date Due  . MAMMOGRAM  06/25/2019  . TETANUS/TDAP  09/16/2020  . COLONOSCOPY  10/30/2021  . INFLUENZA VACCINE  Completed  . DEXA SCAN  Completed  . Hepatitis C Screening  Completed  . PNA vac Low Risk Adult  Completed    Cancer Screenings:  Colorectal Screening: Completed 06/26/12. Repeat every 10 years.  Mammogram: Completed 06/24/18. Repeat every year.  Bone Density: Completed 12/14/16. Results reflect OSTEOPOROSIS. Repeat every 2 years. Ordered 07/31/18. Pt provided with contact information and advised to call to schedule appt.   Lung Cancer Screening: (Low Dose CT Chest recommended if Age 78-80 years, 30 pack-year currently smoking OR have quit w/in 15years.) does not qualify.   Additional Screening:  Hepatitis C Screening: does qualify; Completed 03/17/16  Vision Screening: Recommended annual ophthalmology exams for early detection of glaucoma and other disorders of the eye. Is the patient up to date with their annual eye exam?  Yes  Who is the provider or what is the name of the office in which the pt attends annual eye exams? [facility] Screening: Recommended annual dental exams for proper oral hygiene  Community Resource Referral:  CRR required this visit?  No        Plan:     I have personally reviewed and addressed the Medicare Annual Wellness questionnaire and have noted the following in the patient's chart:  A. Medical and social history B. Use of alcohol, tobacco or illicit drugs  C. Current medications and supplements D. Functional ability and status E.  Nutritional status F.  Physical activity G. Advance directives H. List of other physicians I.  Hospitalizations, surgeries, and ER visits in previous 12 months J.  [place] such as hearing and vision if needed, cognitive and  depression L. Referrals and appointments   In addition, I have reviewed and discussed with patient certain preventive protocols, quality metrics, and best practice recommendations. A written personalized care plan for preventive services as well as general preventive health recommendations were provided to patient.   Signed,  Clemetine Marker, LPN Nurse Health Advisor   Nurse Notes: patient feeling much better since visit earlier this week and appreciative of visit today.

## 2018-12-27 NOTE — Patient Instructions (Signed)
Mary Todd , Thank you for taking time to come for your Medicare Wellness Visit. I appreciate your ongoing commitment to your health goals. Please review the following plan we discussed and let me know if I can assist you in the future.   Screening recommendations/referrals: Colonoscopy: 06/26/12. Repeat in 2023. Mammogram: done 06/24/18 Bone Density: done 12/14/16. Please call [phone] to schedule your bone density screening.  Recommended yearly ophthalmology/optometry visit for glaucoma screening and checkup Recommended yearly dental visit for hygiene and checkup  Vaccinations: Influenza vaccine: done 07/03/18 Pneumococcal vaccine: done 05/04/16 Tdap vaccine: done 09/16/2010 Shingles vaccine: done 07/29/18    Advanced directives: Please bring a copy of your health care power of attorney and living will to the office at your convenience.  Conditions/risks identified: Recommend increasing physical activity to at least 90-150 minutes per week.   Next appointment: 03/25/19 11:00 Dr. Ancil Boozer   Preventive Care 72 Years and Older, Female Preventive care refers to lifestyle choices and visits with your health care provider that can promote health and wellness. What does preventive care include?  A yearly physical exam. This is also called an annual well check.  Dental exams once or twice a year.  Routine eye exams. Ask your health care provider how often you should have your eyes checked.  Personal lifestyle choices, including:  Daily care of your teeth and gums.  Regular physical activity.  Eating a healthy diet.  Avoiding tobacco and drug use.  Limiting alcohol use.  Practicing safe sex.  Taking low-dose aspirin every day.  Taking vitamin and mineral supplements as recommended by your health care provider. What happens during an annual well check? The services and screenings done by your health care provider during your annual well check will depend on your age, overall  health, lifestyle risk factors, and family history of disease. Counseling  Your health care provider may ask you questions about your:  Alcohol use.  Tobacco use.  Drug use.  Emotional well-being.  Home and relationship well-being.  Sexual activity.  Eating habits.  History of falls.  Memory and ability to understand (cognition).  Work and work Statistician.  Reproductive health. Screening  You may have the following tests or measurements:  Height, weight, and BMI.  Blood pressure.  Lipid and cholesterol levels. These may be checked every 5 years, or more frequently if you are over 71 years old.  Skin check.  Lung cancer screening. You may have this screening every year starting at age 72 if you have a 30-pack-year history of smoking and currently smoke or have quit within the past 15 years.  Fecal occult blood test (FOBT) of the stool. You may have this test every year starting at age 72.  Flexible sigmoidoscopy or colonoscopy. You may have a sigmoidoscopy every 5 years or a colonoscopy every 10 years starting at age 72.  Hepatitis C blood test.  Hepatitis B blood test.  Sexually transmitted disease (STD) testing.  Diabetes screening. This is done by checking your blood sugar (glucose) after you have not eaten for a while (fasting). You may have this done every 1-3 years.  Bone density scan. This is done to screen for osteoporosis. You may have this done starting at age 72.  Mammogram. This may be done every 1-2 years. Talk to your health care provider about how often you should have regular mammograms. Talk with your health care provider about your test results, treatment options, and if necessary, the need for more tests. Vaccines  Your  health care provider may recommend certain vaccines, such as:  Influenza vaccine. This is recommended every year.  Tetanus, diphtheria, and acellular pertussis (Tdap, Td) vaccine. You may need a Td booster every 10  years.  Zoster vaccine. You may need this after age 72.  Pneumococcal 13-valent conjugate (PCV13) vaccine. One dose is recommended after age 53.  Pneumococcal polysaccharide (PPSV23) vaccine. One dose is recommended after age 72. Talk to your health care provider about which screenings and vaccines you need and how often you need them. This information is not intended to replace advice given to you by your health care provider. Make sure you discuss any questions you have with your health care provider. Document Released: 11/12/2015 Document Revised: 07/05/2016 Document Reviewed: 08/17/2015 Elsevier Interactive Patient Education  2017 [place] Prevention in the Home Falls can cause injuries. They can happen to people of all ages. There are many things you can do to make your home safe and to help prevent falls. What can I do on the outside of my home?  Regularly fix the edges of walkways and driveways and fix any cracks.  Remove anything that might make you trip as you walk through a door, such as a raised step or threshold.  Trim any bushes or trees on the path to your home.  Use bright outdoor lighting.  Clear any walking paths of anything that might make someone trip, such as rocks or tools.  Regularly check to see if handrails are loose or broken. Make sure that both sides of any steps have handrails.  Any raised decks and porches should have guardrails on the edges.  Have any leaves, snow, or ice cleared regularly.  Use sand or salt on walking paths during winter.  Clean up any spills in your garage right away. This includes oil or grease spills. What can I do in the bathroom?  Use night lights.  Install grab bars by the toilet and in the tub and shower. Do not use towel bars as grab bars.  Use non-skid mats or decals in the tub or shower.  If you need to sit down in the shower, use a plastic, non-slip stool.  Keep the floor dry. Clean up any water that  spills on the floor as soon as it happens.  Remove soap buildup in the tub or shower regularly.  Attach bath mats securely with double-sided non-slip rug tape.  Do not have throw rugs and other things on the floor that can make you trip. What can I do in the bedroom?  Use night lights.  Make sure that you have a light by your bed that is easy to reach.  Do not use any sheets or blankets that are too big for your bed. They should not hang down onto the floor.  Have a firm chair that has side arms. You can use this for support while you get dressed.  Do not have throw rugs and other things on the floor that can make you trip. What can I do in the kitchen?  Clean up any spills right away.  Avoid walking on wet floors.  Keep items that you use a lot in easy-to-reach places.  If you need to reach something above you, use a strong step stool that has a grab bar.  Keep electrical cords out of the way.  Do not use floor polish or wax that makes floors slippery. If you must use wax, use non-skid floor wax.  Do not  have throw rugs and other things on the floor that can make you trip. What can I do with my stairs?  Do not leave any items on the stairs.  Make sure that there are handrails on both sides of the stairs and use them. Fix handrails that are broken or loose. Make sure that handrails are as long as the stairways.  Check any carpeting to make sure that it is firmly attached to the stairs. Fix any carpet that is loose or worn.  Avoid having throw rugs at the top or bottom of the stairs. If you do have throw rugs, attach them to the floor with carpet tape.  Make sure that you have a light switch at the top of the stairs and the bottom of the stairs. If you do not have them, ask someone to add them for you. What else can I do to help prevent falls?  Wear shoes that:  Do not have high heels.  Have rubber bottoms.  Are comfortable and fit you well.  Are closed at the  toe. Do not wear sandals.  If you use a stepladder:  Make sure that it is fully opened. Do not climb a closed stepladder.  Make sure that both sides of the stepladder are locked into place.  Ask someone to hold it for you, if possible.  Clearly mark and make sure that you can see:  Any grab bars or handrails.  First and last steps.  Where the edge of each step is.  Use tools that help you move around (mobility aids) if they are needed. These include:  Canes.  Walkers.  Scooters.  Crutches.  Turn on the lights when you go into a dark area. Replace any light bulbs as soon as they burn out.  Set up your furniture so you have a clear path. Avoid moving your furniture around.  If any of your floors are uneven, fix them.  If there are any pets around you, be aware of where they are.  Review your medicines with your doctor. Some medicines can make you feel dizzy. This can increase your chance of falling. Ask your doctor what other things that you can do to help prevent falls. This information is not intended to replace advice given to you by your health care provider. Make sure you discuss any questions you have with your health care provider. Document Released: 08/12/2009 Document Revised: 03/23/2016 Document Reviewed: 11/20/2014 Elsevier Interactive Patient Education  2017 [employer].

## 2019-01-01 ENCOUNTER — Ambulatory Visit: Payer: Medicare Other | Admitting: Family Medicine

## 2019-01-29 ENCOUNTER — Other Ambulatory Visit: Payer: Medicare Other

## 2019-01-29 ENCOUNTER — Ambulatory Visit: Payer: Medicare Other

## 2019-01-29 ENCOUNTER — Ambulatory Visit: Payer: Medicare Other | Admitting: Hematology and Oncology

## 2019-02-11 DIAGNOSIS — M170 Bilateral primary osteoarthritis of knee: Secondary | ICD-10-CM | POA: Diagnosis not present

## 2019-02-12 ENCOUNTER — Other Ambulatory Visit: Payer: Self-pay | Admitting: Family Medicine

## 2019-02-12 DIAGNOSIS — I10 Essential (primary) hypertension: Secondary | ICD-10-CM

## 2019-02-12 NOTE — Telephone Encounter (Signed)
Hypertension medication request: Metoprolol to   Last office visit pertaining to hypertension: 12/27/2018   BP Readings from Last 3 Encounters:  12/27/18 132/82  12/24/18 130/90  12/20/18 140/90    Lab Results  Component Value Date   CREATININE 0.68 12/06/2018   BUN 11 12/06/2018   NA 139 12/06/2018   K 4.5 12/06/2018   CL 103 12/06/2018   CO2 27 12/06/2018     Follow up on 03/25/2019

## 2019-02-25 ENCOUNTER — Other Ambulatory Visit: Payer: Self-pay

## 2019-02-25 ENCOUNTER — Inpatient Hospital Stay: Payer: Medicare Other

## 2019-02-25 ENCOUNTER — Inpatient Hospital Stay: Payer: Medicare Other | Attending: Hematology and Oncology

## 2019-02-25 VITALS — BP 160/61 | HR 87 | Temp 97.4°F | Resp 18

## 2019-02-25 DIAGNOSIS — Z170 Estrogen receptor positive status [ER+]: Secondary | ICD-10-CM | POA: Diagnosis not present

## 2019-02-25 DIAGNOSIS — C50911 Malignant neoplasm of unspecified site of right female breast: Secondary | ICD-10-CM | POA: Diagnosis not present

## 2019-02-25 DIAGNOSIS — M810 Age-related osteoporosis without current pathological fracture: Secondary | ICD-10-CM

## 2019-02-25 LAB — CBC WITH DIFFERENTIAL/PLATELET
Abs Immature Granulocytes: 0.03 10*3/uL (ref 0.00–0.07)
Basophils Absolute: 0 10*3/uL (ref 0.0–0.1)
Basophils Relative: 0 %
Eosinophils Absolute: 0.2 10*3/uL (ref 0.0–0.5)
Eosinophils Relative: 2 %
HCT: 37.7 % (ref 36.0–46.0)
Hemoglobin: 12.7 g/dL (ref 12.0–15.0)
Immature Granulocytes: 0 %
Lymphocytes Relative: 30 %
Lymphs Abs: 2.8 10*3/uL (ref 0.7–4.0)
MCH: 31.1 pg (ref 26.0–34.0)
MCHC: 33.7 g/dL (ref 30.0–36.0)
MCV: 92.4 fL (ref 80.0–100.0)
Monocytes Absolute: 0.7 10*3/uL (ref 0.1–1.0)
Monocytes Relative: 8 %
Neutro Abs: 5.6 10*3/uL (ref 1.7–7.7)
Neutrophils Relative %: 60 %
Platelets: 219 10*3/uL (ref 150–400)
RBC: 4.08 MIL/uL (ref 3.87–5.11)
RDW: 14.7 % (ref 11.5–15.5)
WBC: 9.3 10*3/uL (ref 4.0–10.5)
nRBC: 0 % (ref 0.0–0.2)

## 2019-02-25 LAB — COMPREHENSIVE METABOLIC PANEL
ALT: 26 U/L (ref 0–44)
AST: 18 U/L (ref 15–41)
Albumin: 4 g/dL (ref 3.5–5.0)
Alkaline Phosphatase: 83 U/L (ref 38–126)
Anion gap: 7 (ref 5–15)
BUN: 19 mg/dL (ref 8–23)
CO2: 25 mmol/L (ref 22–32)
Calcium: 8.9 mg/dL (ref 8.9–10.3)
Chloride: 103 mmol/L (ref 98–111)
Creatinine, Ser: 0.55 mg/dL (ref 0.44–1.00)
GFR calc Af Amer: >60 mL/min (ref >60)
GFR calc non Af Amer: >60 mL/min (ref >60)
Glucose, Bld: 101 mg/dL — ABNORMAL HIGH (ref 70–99)
Potassium: 4.1 mmol/L (ref 3.5–5.1)
Sodium: 135 mmol/L (ref 135–145)
Total Bilirubin: 0.8 mg/dL (ref 0.3–1.2)
Total Protein: 7.5 g/dL (ref 6.5–8.1)

## 2019-02-25 MED ORDER — DENOSUMAB 60 MG/ML ~~LOC~~ SOSY
60.0000 mg | PREFILLED_SYRINGE | Freq: Once | SUBCUTANEOUS | Status: AC
  Administered 2019-02-25: 60 mg via SUBCUTANEOUS

## 2019-02-25 NOTE — Patient Instructions (Signed)

## 2019-02-26 ENCOUNTER — Ambulatory Visit: Payer: Medicare Other | Admitting: Hematology and Oncology

## 2019-02-26 ENCOUNTER — Ambulatory Visit: Payer: Medicare Other

## 2019-02-26 ENCOUNTER — Other Ambulatory Visit: Payer: Medicare Other

## 2019-02-26 LAB — CANCER ANTIGEN 27.29: CA 27.29: 12.1 U/mL (ref 0.0–38.6)

## 2019-03-04 ENCOUNTER — Other Ambulatory Visit: Payer: Self-pay

## 2019-03-04 NOTE — Progress Notes (Signed)
[facility]  [address], [address] [place], [place] [address] Phone: [phone]  Fax: [phone]   Telemedicine Office Visit:  03/05/2019  Referring physician: Steele Sizer, MD  I connected with Leana Gamer Brooke on 03/05/2019 at 1:11 PM by videoconferencing and verified that I was speaking with the correct person using 2 identifiers.  The patient was at home.  I discussed the limitations, risk, security and privacy concerns of performing an evaluation and management service by videoconferencing and the availability of in person appointments.  I also discussed with the patient that there may be a patient responsible charge related to this service.  The patient expressed understanding and agreed to proceed.   Chief Complaint: Mary Todd is a 73 y.o. female with stage II Hodgkin's disease (2004) and stage IA right breast cancer (2011) who is seen for 6 month assessment.  HPI: The patient was last seen in the oncology clinic on 07/31/2018. At that time, she was doing well.  She continued to have back and knee discomfort.  Exam was stable.  Hemoglobin was 10.8.  CA27.29 was 5.7.  She presented to the [facility] ED on 12/04/2018 for loss of consciousness.  On arrival, she was hypotensive.  EKG revealed no evidence of dysrhythmias or ischemia. CXR was stable and showed no acute findings.  Head CT revealed no evidence of acute intracranial abnormality. There was progressive chronic small vessel ischemic disease and cerebral atrophy. She was given 2L of normal saline and discharged with instructions to hold her amlodipine and metoprolol.   On 12/06/2018 her Raelyn Ensign, FNP discontinued amlodipine and decreased metoprolol to 19m daily.  KSteele Sizer MD, changed her metformin to 1x daily and amlodipine to 1x daily on 12/24/2018.   She received Prolia injection on 02/25/2019. CBC showed: hemoglobin 12.7, hematocrit 37.7, calcium 8.9. CA 27.29 was  12.1.  During the interim, the patient is "good." She denies any additional syncopal episodes. She uses Nystatin for persistent Candidal rash under her breast. She denies any breast concerns. She continues on Arimidex.   She is managing her diabetes. She takes calcium and vitamin D supplements. She recently had an "injection" in both knees and is feeling well. She is followed by Dr. SGabriel Carinain endocrinology.    Past Medical History:  Diagnosis Date  . Allergy   . Arthritis    knees, lower back  . [place] ([place]   2011, T1A N0, radiation  . Esophageal reflux   . [place]  . Hypertension 2006  . Osteoporosis   . Personal history of malignant neoplasm of breast 2011   right breast  . Personal history of radiation therapy 2011   RIGHT lumpectomy w/ radiation    Past Surgical History:  Procedure Laterality Date  . ABDOMINAL HYSTERECTOMY    . BACK SURGERY    . BREAST BIOPSY Right 2011   radiation  . BREAST LUMPECTOMY Right 2011   w/ radiation  . BREAST SURGERY Right 2011   right breast lumpectomy,L/SN/R for right breast CA   . CATARACT EXTRACTION W/PHACO Left 06/10/2018   Procedure: CATARACT EXTRACTION PHACO AND INTRAOCULAR LENS PLACEMENT ([place];  Surgeon: KEulogio Bear MD;  [facility]  Service: Ophthalmology;  Laterality: Left;  Diabetic - oral meds  . CATARACT EXTRACTION W/PHACO Right 07/15/2018   Procedure: CATARACT EXTRACTION PHACO AND INTRAOCULAR LENS PLACEMENT ([place] RIGHT;  Surgeon: KEulogio Bear MD;  [facility]  Service: Ophthalmology;  Laterality: Right;  . COLONOSCOPY  2003  . PORT A CATH REVISION    . WRIST SURGERY  2005    Family History  Problem Relation Age of Onset  . Cancer Father   . Bone cancer Father   . Breast cancer Mother 8  . Stroke Mother   . Colon cancer Maternal Grandmother   . Breast cancer Cousin     Social History:  reports that she has never smoked. She has  never used smokeless tobacco. She reports current alcohol use of about 2.0 standard drinks of alcohol per week. She reports that she does not use drugs. She is retired.  She previously worked for [employer].  She went on a 14-day trip to [place] in March 2018 with a group of 20 acquaintances.  Participants in the patient's visit and their role in the encounter included the patient and Vito Berger, CMA, today.  The intake visit was provided by Vito Berger, CMA.  Allergies:  Allergies  Allergen Reactions  . Shrimp [Shellfish Allergy] Hives  . Augmentin [Amoxicillin-Pot Clavulanate] Nausea Only    Current Medications: Current Outpatient Medications  Medication Sig Dispense Refill  . acetaminophen (TYLENOL) 650 MG CR tablet Take 1,300 mg by mouth every 8 (eight) hours as needed for pain.    Marland Kitchen amLODipine (NORVASC) 5 MG tablet Take 1 tablet (5 mg total) by mouth daily. 90 tablet 0  . anastrozole (ARIMIDEX) 1 MG tablet TAKE 1 TABLET BY MOUTH ONCE DAILY 90 tablet 3  . aspirin 81 MG tablet Take 81 mg by mouth daily.    Marland Kitchen CALCIUM-MAGNESIUM PO Take 1 tablet by mouth daily.    . cetirizine (ZYRTEC) 10 MG tablet Take 10 mg by mouth daily.    . Cholecalciferol (VITAMIN D-3 PO) Take 1 tablet by mouth daily.    Marland Kitchen denosumab (PROLIA) 60 MG/ML SOSY injection Inject 60 mg into the skin every 6 (six) months.    . meloxicam (MOBIC) 15 MG tablet Take 1 tablet (15 mg total) by mouth daily. 90 tablet 1  . metFORMIN (GLUCOPHAGE-XR) 750 MG 24 hr tablet Take 1 tablet (750 mg total) by mouth daily with breakfast. 90 tablet 1  . metoprolol succinate (TOPROL-XL) 50 MG 24 hr tablet Take 1 tablet by mouth once daily 90 tablet 0  . Multiple Vitamin (MULTIVITAMIN) tablet Take 1 tablet by mouth daily.    . pantoprazole (PROTONIX) 40 MG tablet Take 1 tablet (40 mg total) by mouth daily. 90 tablet 1  . azithromycin (ZITHROMAX) 250 MG tablet Take as directed (Patient not taking: Reported on 03/05/2019)  6 tablet 0  . fluticasone (FLONASE) 50 MCG/ACT nasal spray Place 2 sprays into both nostrils daily. 16 g 0  . nystatin (MYCOSTATIN/NYSTOP) powder Apply topically 2 (two) times daily.    . promethazine-dextromethorphan (PROMETHAZINE-DM) 6.25-15 MG/5ML syrup Take 5 mLs by mouth 4 (four) times daily as needed for cough. 118 mL 0   No current facility-administered medications for this visit.     Review of Systems  Constitutional: Negative for chills, fever, malaise/fatigue and weight loss (stable).       Feels "good".  HENT: Negative.  Negative for congestion, ear pain, hearing loss, nosebleeds, sinus pain and sore throat.   Eyes: Negative for blurred vision, double vision, photophobia and pain.       S/p cataract removal. Vision is good, uses reading glasses.  Respiratory: Negative for cough, hemoptysis, sputum production, shortness of breath and wheezing.   Cardiovascular: Negative.  Negative for chest pain, palpitations, orthopnea, leg swelling and PND.  Gastrointestinal: Negative.  Negative for abdominal pain, blood in stool, constipation, diarrhea, heartburn, melena, nausea and vomiting.  Genitourinary: Negative.  Negative for dysuria, frequency and urgency.  Musculoskeletal: Negative.  Negative for back pain, joint pain and myalgias.  Skin: Positive for rash. Negative for itching.       Candidal rash under breast using topical Nystatin.  Neurological: Negative.  Negative for dizziness, sensory change, speech change, focal weakness, weakness and headaches.  Endo/Heme/Allergies: Does not bruise/bleed easily.       Diabetes on Metformin.  Thyroid nodules (benign; sees endocrinology).   Psychiatric/Behavioral: Negative.  Negative for depression and memory loss. The patient is not nervous/anxious and does not have insomnia.    Performance status (ECOG): 1  Physical Exam  Constitutional: She is oriented to person, place, and time. She appears well-developed and well-nourished. No distress.   HENT:  Short hair.  Eyes: Conjunctivae are normal. No scleral icterus.  Brown eyes.  Neurological: She is alert and oriented to person, place, and time.  Skin: She is not diaphoretic. No pallor.  Psychiatric: She has a normal mood and affect. Her behavior is normal. Judgment and thought content normal.  Nursing note reviewed.    No visits with results within 3 Day(s) from this visit.  Latest known visit with results is:  Appointment on 02/25/2019  Component Date Value Ref Range Status  . CA 27.29 02/25/2019 12.1  0.0 - 38.6 U/mL Final   Comment: (NOTE) Siemens Centaur Immunochemiluminometric Methodology [facility]) Values obtained with different assay methods or kits cannot be used interchangeably. Results cannot be interpreted as absolute evidence of the presence or absence of malignant disease. Performed At: [facility] [place], [place] [id number] Rush Farmer MD ME:[id number]   . Sodium 02/25/2019 135  135 - 145 mmol/L Final  . Potassium 02/25/2019 4.1  3.5 - 5.1 mmol/L Final  . Chloride 02/25/2019 103  98 - 111 mmol/L Final  . CO2 02/25/2019 25  22 - 32 mmol/L Final  . Glucose, Bld 02/25/2019 101* 70 - 99 mg/dL Final  . BUN 02/25/2019 19  8 - 23 mg/dL Final  . Creatinine, Ser 02/25/2019 0.55  0.44 - 1.00 mg/dL Final  . Calcium 02/25/2019 8.9  8.9 - 10.3 mg/dL Final  . Total Protein 02/25/2019 7.5  6.5 - 8.1 g/dL Final  . Albumin 02/25/2019 4.0  3.5 - 5.0 g/dL Final  . AST 02/25/2019 18  15 - 41 U/L Final  . ALT 02/25/2019 26  0 - 44 U/L Final  . Alkaline Phosphatase 02/25/2019 83  38 - 126 U/L Final  . Total Bilirubin 02/25/2019 0.8  0.3 - 1.2 mg/dL Final  . GFR calc non Af Amer 02/25/2019 >60  >60 mL/min Final  . GFR calc Af Amer 02/25/2019 >60  >60 mL/min Final  . Anion gap 02/25/2019 7  5 - 15 Final   Performed at [facility], [address], [place], [place] [address]  . WBC 02/25/2019 9.3  4.0 - 10.5 K/uL Final  . RBC 02/25/2019  4.08  3.87 - 5.11 MIL/uL Final  . Hemoglobin 02/25/2019 12.7  12.0 - 15.0 g/dL Final  . HCT 02/25/2019 37.7  36.0 - 46.0 % Final  . MCV 02/25/2019 92.4  80.0 - 100.0 fL Final  . MCH 02/25/2019 31.1  26.0 - 34.0 pg Final  . MCHC 02/25/2019 33.7  30.0 - 36.0 g/dL Final  . RDW  02/25/2019 14.7  11.5 - 15.5 % Final  . Platelets 02/25/2019 219  150 - 400 K/uL Final  . nRBC 02/25/2019 0.0  0.0 - 0.2 % Final  . Neutrophils Relative % 02/25/2019 60  % Final  . Neutro Abs 02/25/2019 5.6  1.7 - 7.7 K/uL Final  . Lymphocytes Relative 02/25/2019 30  % Final  . Lymphs Abs 02/25/2019 2.8  0.7 - 4.0 K/uL Final  . Monocytes Relative 02/25/2019 8  % Final  . Monocytes Absolute 02/25/2019 0.7  0.1 - 1.0 K/uL Final  . Eosinophils Relative 02/25/2019 2  % Final  . Eosinophils Absolute 02/25/2019 0.2  0.0 - 0.5 K/uL Final  . Basophils Relative 02/25/2019 0  % Final  . Basophils Absolute 02/25/2019 0.0  0.0 - 0.1 K/uL Final  . Immature Granulocytes 02/25/2019 0  % Final  . Abs Immature Granulocytes 02/25/2019 0.03  0.00 - 0.07 K/uL Final   Performed at [facility], [address] [place], [place] [address]    Assessment:  Nykira Reddix is a 73 y.o. female with stage II Hodgkin's disease (2004) and stage IA right breast cancer (2011).  She was diagnosed with stage IIA Hodgkin's disease after presenting with low counts and joint pain.  Abdomen and pelvic CT scan as well as PET scan revealed a pelvic mass and retroperitoneal lymphadenopathy.  Biopsy confirmed Hodgkin's disease.    She received ABVD times 5 through cycle 3-A and then AVD without the Bleomycin times 7 subsequent cycles.  Treatment completed in 12/2003.   CT and PET scans were negative in 08/2007and in 06/2006.  She underwent right breast lumpectomy and sentinel lymph node biopsy on 07/21/2010.  Pathology revealed a 0.5 cm grade II invasive ductal carcinoma.  Two sentinel lymph nodes were negative.  Tumor was ER +  (>90%), PR + (40%), and Her2/neu 1+.  Pathologic stage was T1aN0Mx.  She received Mammosite radiation (completed 08/22/2010).  Oncotype DX testing revealed a low risk lesion.  BRCA1/2 testing was negative.  She received 5 years of an aromatase inhibitor.  Breast cancer index (BCI) testing revealed a 6.3% (CI: 2.9%-9.6%) risk of late recurrent disease (years 5-10) after 5 years of hormonal therapy and a high likelihood of benefit from continued hormonal therapy (30% reduction).  Decision was made to continue Arimidex for 5 more years.  Bilateral mammogram on 06/24/2018  revealed no evidence of recurrent disease (ordered by Dr. Bary Castilla).  CA27.29 has been followed:  8.4 on 07/19/2016, 13.4 on 01/24/2017, 7.1 on 08/01/2017, 12.1 on 01/30/2018, 5.7 on 07/31/2018, and 12.1 on 02/25/2019.  Thyroid ultrasound  on 04/13/2007 revealed a 2.3 cm solid and cystic nodule within the left thyroid gland. There was also a small nodule within the right thyroid gland that did not meet size criteria for measurement. There was a single benign appearing lymph node within the right neck.  FNA in 06/2016 was  "indeterminate" and in 08/2016 was "benign".    Bone density study on 09/05/2011 revealed osteopenia with a  T-score of -1.7 in L1-L4.  Bone density study on 11/14/2012 revealed osteopenia with a  T-score of -1.5 in L1-L4.  Bone density study on 12/14/2016 revealed osteoporosis in L2-3 with a T-score of -3.1.  Left femur normal with T-score of -0.8.  She began Prolia on 01/24/2017 (last 02/25/2019).  Symptomatically, she is doing well.  She denies any concerns.  CA27.29 was normal.  Plan: 1.   Review labs from 02/25/2019. 2.  Stage IA right  breast cancer             Clinically doing well.  Continue Arimidex.             Next mammogram on 04/25/2019 (ordered by Dr. Bary Castilla). 3.  Stage II Hodgkin's disease             Clinically doing well without B symptoms or adenopathy.  Labs are unremakable.              Continue surveillance.  Imaging only if indicated. 4.  Osteoporosis             Continue cacium and vitamin D..             Patient received Prolia on 02/25/2019.             Schedule bone density (late). 5.  Thyroid nodule             Followed by Dr Gabriel Carina, endocrinology. 6.  RTC in 6 months for MD assessment and labs (CBC with diff, CMP, CA27.29), and Prolia.   I discussed the assessment and treatment plan with the patient.  The patient was provided an opportunity to ask questions and all were answered.  The patient agreed with the plan and demonstrated an understanding of the instructions.  The patient was advised to call back or seek an in person evaluation if the symptoms worsen or if the condition fails to improve as anticipated.  I provided 11 minutes (1:11 PM - 1:22PM) of face-to-face video visit time during this this encounter and > 50% was spent counseling as documented under my assessment and plan.  I provided these services from the [facility] office.   Nolon Stalls, MD, PhD  03/05/2019, 1:11 PM  I, Molly Dorshimer, am acting as Education administrator for [employer]. Mike Gip, MD, PhD.  I, Melissa C. Mike Gip, MD, have reviewed the above documentation for accuracy and completeness, and I agree with the above.

## 2019-03-05 ENCOUNTER — Inpatient Hospital Stay: Payer: Medicare Other | Attending: Hematology and Oncology | Admitting: Hematology and Oncology

## 2019-03-05 ENCOUNTER — Encounter: Payer: Self-pay | Admitting: Hematology and Oncology

## 2019-03-05 DIAGNOSIS — C50911 Malignant neoplasm of unspecified site of right female breast: Secondary | ICD-10-CM

## 2019-03-05 DIAGNOSIS — M810 Age-related osteoporosis without current pathological fracture: Secondary | ICD-10-CM

## 2019-03-05 DIAGNOSIS — C8193 Hodgkin lymphoma, unspecified, intra-abdominal lymph nodes: Secondary | ICD-10-CM

## 2019-03-05 NOTE — Progress Notes (Signed)
No new changes noted today. The patient Name, DOB and address has been verified by phone today. 

## 2019-03-14 ENCOUNTER — Other Ambulatory Visit: Payer: Self-pay | Admitting: Family Medicine

## 2019-03-17 DIAGNOSIS — H318 Other specified disorders of choroid: Secondary | ICD-10-CM | POA: Diagnosis not present

## 2019-03-25 ENCOUNTER — Encounter: Payer: Self-pay | Admitting: Family Medicine

## 2019-03-25 ENCOUNTER — Ambulatory Visit (INDEPENDENT_AMBULATORY_CARE_PROVIDER_SITE_OTHER): Payer: Medicare Other | Admitting: Family Medicine

## 2019-03-25 ENCOUNTER — Other Ambulatory Visit: Payer: Self-pay

## 2019-03-25 VITALS — BP 138/86 | HR 85 | Resp 14 | Ht 67.0 in | Wt 211.1 lb

## 2019-03-25 DIAGNOSIS — M810 Age-related osteoporosis without current pathological fracture: Secondary | ICD-10-CM | POA: Diagnosis not present

## 2019-03-25 DIAGNOSIS — R7303 Prediabetes: Secondary | ICD-10-CM | POA: Diagnosis not present

## 2019-03-25 DIAGNOSIS — M170 Bilateral primary osteoarthritis of knee: Secondary | ICD-10-CM | POA: Diagnosis not present

## 2019-03-25 DIAGNOSIS — K219 Gastro-esophageal reflux disease without esophagitis: Secondary | ICD-10-CM

## 2019-03-25 DIAGNOSIS — R32 Unspecified urinary incontinence: Secondary | ICD-10-CM

## 2019-03-25 DIAGNOSIS — I10 Essential (primary) hypertension: Secondary | ICD-10-CM | POA: Diagnosis not present

## 2019-03-25 DIAGNOSIS — C8193 Hodgkin lymphoma, unspecified, intra-abdominal lymph nodes: Secondary | ICD-10-CM

## 2019-03-25 NOTE — Progress Notes (Addendum)
Name: Mary Todd   MRN: [id number]    DOB: 1945-11-11   Date:03/25/2019       Progress Note  Subjective  Chief Complaint  Chief Complaint  Patient presents with  . Hypertension  . Gastroesophageal Reflux  . Rash    onset 2 days bilateral underarm, red, bumps with itching    HPI  Pre-diabetes: she denies polyphagia, polydipsia or polyuria, shetolerates metformin well, no side effects. Last A1C was at goal    Urge incontinence: seldom has episodes, she states usually when sitting and has an urge to go, when she stands up can have an accident, she has nocturia about 4 times per night. Discussed PT. She states she is trying to wait too long to use bathroom, not interested in having PT at this time . She agrees on seeing Urologist at this time  HTN: taking medication as prescribed, she had been off Norvasc because of episode of syncope ( likely from dehydration) but is back taking Norvasc 5 mg and back on metoprolol XL 50 mg and bp is at goal today   GERD:she tried stopping Pantoprazole but as soon as she went off medication symptoms became severe, with heartburn, and regurgitation, andhas been taking it daily again. Unchanged   OA: taking Meloxicam daily, explained that it can make GERD worse, she tried stopping Meloxicam but pain was too intense, she states her whole body ached, so she went back to once daily.She is also taking Tylenol, she sees Ortho and had steroid injections and it has helped with pain. She is going to hold off on replacement until no longer controlled with medication - she is the primary caregiver for her father   RightBreast Cancer: under the care of Dr. Mike Gip and now Dr. Fleet Contras. Also diagnosed with hodgkin's lymphoma in 2004, breast cancer diagnosed in 2011still on Arimidex until 2021. Had to start Prolia for osteoporosis, found on bone density of spine, she is tolerating medications well, last mammogram was normal- Fall 2019. Last Prolia  infusion was 01/2019  Thyroid nodules: incidental finding on CT neck , had biopsy by Dr. Gabriel Carina Dec 2017 and biopsy was negative, still has yearly follow ups with her.No dysphagia, constipation, denies dry skin.Unchanged   Obesity: she has long history of obesity, she ison Metformin, weight is stable since last visit. Decreasing portion size. She has not been as active since COVID-19   Rash: under both arms, not very itchy, but she noticed when she touched the area, skin was not as smooth, after I asked about change in deodorant, she states it started within two days of a change, she will go back to previous deodorant and let us know if resolves  Patient Active Problem List   Diagnosis Date Noted  . Encounter for screening mammogram for breast cancer 07/02/2018  . Anterolisthesis 08/06/2017  . DDD (degenerative disc disease), lumbosacral 08/06/2017  . Chondromalacia patellae 07/03/2017  . Osteoporosis 12/14/2016  . Morbid obesity ([place]) 07/17/2016  . Spinal stenosis in cervical region 05/22/2016  . Thyroid nodule 05/22/2016  . GERD without esophagitis 03/16/2016  . Neck muscle spasm 03/16/2016  . History of back surgery 03/16/2016  . Osteoarthritis of both knees 03/16/2016  . Large breasts 03/16/2016  . Hypertension, benign 03/16/2016  . Perennial allergic rhinitis with seasonal variation 03/16/2016  . History of pneumococcal pneumonia 09/20/2015  . Hodgkin disease ([place]) 07/15/2014  . Breast cancer, right ([place]) 06/26/2013    Past Surgical History:  Procedure Laterality Date  . ABDOMINAL  HYSTERECTOMY    . BACK SURGERY    . BREAST BIOPSY Right 2011   radiation  . BREAST LUMPECTOMY Right 2011   w/ radiation  . BREAST SURGERY Right 2011   right breast lumpectomy,L/SN/R for right breast CA   . CATARACT EXTRACTION W/PHACO Left 06/10/2018   Procedure: CATARACT EXTRACTION PHACO AND INTRAOCULAR LENS PLACEMENT ([place]) LEFT;  Surgeon: Eulogio Bear, MD;  Location: [facility];  Service: Ophthalmology;  Laterality: Left;  Diabetic - oral meds  . CATARACT EXTRACTION W/PHACO Right 07/15/2018   Procedure: CATARACT EXTRACTION PHACO AND INTRAOCULAR LENS PLACEMENT ([place]) RIGHT;  Surgeon: Eulogio Bear, MD;  Location: [facility];  Service: Ophthalmology;  Laterality: Right;  . COLONOSCOPY  2003  . PORT A CATH REVISION    . WRIST SURGERY  2005    Family History  Problem Relation Age of Onset  . Cancer Father   . Bone cancer Father   . Breast cancer Mother 22  . Stroke Mother   . Colon cancer Maternal Grandmother   . Breast cancer Cousin     Social History   Socioeconomic History  . Marital status: Married    Spouse name: [name]  . Number of children: 2  . Years of education: some college  . Highest education level: 12th grade  Occupational History  . Occupation: Retired  Scientific laboratory technician  . Financial resource strain: Not hard at all  . Food insecurity:    Worry: Never true    Inability: Never true  . Transportation needs:    Medical: No    Non-medical: No  Tobacco Use  . Smoking status: Never Smoker  . Smokeless tobacco: Never Used  . Tobacco comment: smoking cessation materials not required  Substance and Sexual Activity  . Alcohol use: Yes    Alcohol/week: 2.0 standard drinks    Types: 2 Glasses of wine per week    Comment: occassionally  . Drug use: No  . Sexual activity: Not Currently  Lifestyle  . Physical activity:    Days per week: 0 days    Minutes per session: 0 min  . Stress: Not at all  Relationships  . Social connections:    Talks on phone: More than three times a week    Gets together: More than three times a week    Attends religious service: More than 4 times per year    Active member of club or organization: No    Attends meetings of clubs or organizations: Never    Relationship status: Married  . Intimate partner violence:    Fear of current or ex partner: No    Emotionally abused: No     Physically abused: No    Forced sexual activity: No  Other Topics Concern  . Not on file  Social History Narrative  . Not on file     Current Outpatient Medications:  .  acetaminophen (TYLENOL) 650 MG CR tablet, Take 1,300 mg by mouth every 8 (eight) hours as needed for pain., Disp: , Rfl:  .  amLODipine (NORVASC) 5 MG tablet, Take 1 tablet (5 mg total) by mouth daily., Disp: 90 tablet, Rfl: 0 .  anastrozole (ARIMIDEX) 1 MG tablet, TAKE 1 TABLET BY MOUTH ONCE DAILY, Disp: 90 tablet, Rfl: 3 .  aspirin 81 MG tablet, Take 81 mg by mouth daily., Disp: , Rfl:  .  CALCIUM-MAGNESIUM PO, Take 1 tablet by mouth daily., Disp: , Rfl:  .  cetirizine (ZYRTEC) 10 MG  tablet, Take 10 mg by mouth daily., Disp: , Rfl:  .  Cholecalciferol (VITAMIN D-3 PO), Take 1 tablet by mouth daily., Disp: , Rfl:  .  denosumab (PROLIA) 60 MG/ML SOSY injection, Inject 60 mg into the skin every 6 (six) months., Disp: , Rfl:  .  meloxicam (MOBIC) 15 MG tablet, Take 1 tablet (15 mg total) by mouth daily., Disp: 90 tablet, Rfl: 1 .  metFORMIN (GLUCOPHAGE-XR) 750 MG 24 hr tablet, Take 1 tablet (750 mg total) by mouth daily with breakfast., Disp: 90 tablet, Rfl: 1 .  metoprolol succinate (TOPROL-XL) 50 MG 24 hr tablet, Take 1 tablet by mouth once daily, Disp: 90 tablet, Rfl: 0 .  Multiple Vitamin (MULTIVITAMIN) tablet, Take 1 tablet by mouth daily., Disp: , Rfl:  .  nystatin (MYCOSTATIN/NYSTOP) powder, Apply topically 2 (two) times daily., Disp: , Rfl:  .  pantoprazole (PROTONIX) 40 MG tablet, Take 1 tablet (40 mg total) by mouth daily., Disp: 90 tablet, Rfl: 1 .  fluticasone (FLONASE) 50 MCG/ACT nasal spray, Place 2 sprays into both nostrils daily. (Patient not taking: Reported on 03/25/2019), Disp: 16 g, Rfl: 0  Allergies  Allergen Reactions  . Shrimp [Shellfish Allergy] Hives  . Augmentin [Amoxicillin-Pot Clavulanate] Nausea Only    I personally reviewed active problem list, medication list, allergies, family history,  social history with the patient/caregiver today.   ROS  Constitutional: Negative for fever or weight change.  Respiratory: Negative for cough and shortness of breath.   Cardiovascular: Negative for chest pain or palpitations.  Gastrointestinal: Negative for abdominal pain, no bowel changes.  Musculoskeletal: Negative for gait problem or joint swelling.  Skin: Positive for rash.  Neurological: Negative for dizziness or headache.  No other specific complaints in a complete review of systems (except as listed in HPI above).  Objective  Vitals:   03/25/19 1103  BP: 138/86  Pulse: 85  Resp: 14  SpO2: 95%  Weight: 211 lb 1.6 oz (95.8 kg)  Height: 5\' 7"  (1.702 m)    Body mass index is 33.06 kg/m.  Physical Exam  Constitutional: Patient appears well-developed and well-nourished. Obese No distress.  HEENT: head atraumatic, normocephalic, pupils equal and reactive to light,  neck supple Cardiovascular: Normal rate, regular rhythm and normal heart sounds.  No murmur heard. No BLE edema. Pulmonary/Chest: Effort normal and breath sounds normal. No respiratory distress. Abdominal: Soft.  There is no tenderness. Psychiatric: Patient has a normal mood and affect. behavior is normal. Judgment and thought content normal. Skin: erythematous rash on axilla, no oozing, some pilling noticed bilaterally  Muscular skeletal: crepitus with extension of both knees.   Recent Results (from the past 2160 hour(s))  Cancer antigen 27.29     Status: None   Collection Time: 02/25/19  1:40 PM  Result Value Ref Range   CA 27.29 12.1 0.0 - 38.6 U/mL    Comment: (NOTE) Siemens Centaur Immunochemiluminometric Methodology (ICMA) Values obtained with different assay methods or kits cannot be used interchangeably. Results cannot be interpreted as absolute evidence of the presence or absence of malignant disease. Performed At: [facility], [place] [id number] Rush Farmer MD  PX:[id number]   Comprehensive metabolic panel     Status: Abnormal   Collection Time: 02/25/19  1:40 PM  Result Value Ref Range   Sodium 135 135 - 145 mmol/L   Potassium 4.1 3.5 - 5.1 mmol/L   Chloride 103 98 - 111 mmol/L   CO2 25 22 - 32 mmol/L  Glucose, Bld 101 (H) 70 - 99 mg/dL   BUN 19 8 - 23 mg/dL   Creatinine, Ser 0.55 0.44 - 1.00 mg/dL   Calcium 8.9 8.9 - 10.3 mg/dL   Total Protein 7.5 6.5 - 8.1 g/dL   Albumin 4.0 3.5 - 5.0 g/dL   AST 18 15 - 41 U/L   ALT 26 0 - 44 U/L   Alkaline Phosphatase 83 38 - 126 U/L   Total Bilirubin 0.8 0.3 - 1.2 mg/dL   GFR calc non Af Amer >60 >60 mL/min   GFR calc Af Amer >60 >60 mL/min   Anion gap 7 5 - 15    Comment: Performed at [facility], [address], [place], [place] [address]  CBC with Differential     Status: None   Collection Time: 02/25/19  1:40 PM  Result Value Ref Range   WBC 9.3 4.0 - 10.5 K/uL   RBC 4.08 3.87 - 5.11 MIL/uL   Hemoglobin 12.7 12.0 - 15.0 g/dL   HCT 37.7 36.0 - 46.0 %   MCV 92.4 80.0 - 100.0 fL   MCH 31.1 26.0 - 34.0 pg   MCHC 33.7 30.0 - 36.0 g/dL   RDW 14.7 11.5 - 15.5 %   Platelets 219 150 - 400 K/uL   nRBC 0.0 0.0 - 0.2 %   Neutrophils Relative % 60 %   Neutro Abs 5.6 1.7 - 7.7 K/uL   Lymphocytes Relative 30 %   Lymphs Abs 2.8 0.7 - 4.0 K/uL   Monocytes Relative 8 %   Monocytes Absolute 0.7 0.1 - 1.0 K/uL   Eosinophils Relative 2 %   Eosinophils Absolute 0.2 0.0 - 0.5 K/uL   Basophils Relative 0 %   Basophils Absolute 0.0 0.0 - 0.1 K/uL   Immature Granulocytes 0 %   Abs Immature Granulocytes 0.03 0.00 - 0.07 K/uL    Comment: Performed at [facility], [address] [place], [place] [address]     PHQ2/9: Depression screen [facility] 2/9 03/25/2019 12/27/2018 12/24/2018 12/06/2018 07/03/2018  Decreased Interest 0 0 0 0 0  Down, Depressed, Hopeless 1 0 0 0 0  PHQ - 2 Score 1 0 0 0 0  Altered sleeping 0 - - 0 0  Tired, decreased energy 0 - - 0 0  Change in appetite 0 - - 0 0   Feeling bad or failure about yourself  0 - - 0 0  Trouble concentrating 0 - - 0 0  Moving slowly or fidgety/restless 0 - - 0 0  Suicidal thoughts 0 - - 0 0  PHQ-9 Score 1 - - 0 0  Difficult doing work/chores Not difficult at all - - Not difficult at all Not difficult at all    phq 9 is negative - worried about COVID-19    Fall Risk: Fall Risk  03/25/2019 12/27/2018 12/24/2018 12/06/2018 07/03/2018  Falls in the past year? 0 0 0 0 No  Number falls in past yr: 0 0 0 0 -  Injury with Fall? 0 0 0 0 -  Follow up - Falls prevention discussed - Falls evaluation completed -     Functional Status Survey: Is the patient deaf or have difficulty hearing?: No Does the patient have difficulty seeing, even when wearing glasses/contacts?: No Does the patient have difficulty concentrating, remembering, or making decisions?: No Does the patient have difficulty walking or climbing stairs?: No Does the patient have difficulty dressing or bathing?: No Does the patient  have difficulty doing errands alone such as visiting a doctor's office or shopping?: No    Assessment & Plan  1. Essential hypertension  At goal   2. Pre-diabetes  Recheck yearly  3. GERD without esophagitis  Controlled   4. Age-related osteoporosis without current pathological fracture  Getting Prolia   5. Primary osteoarthritis of both knees  Seeing Ortho  6. Incontinence in female  - Ambulatory referral to Urology

## 2019-04-29 DIAGNOSIS — E042 Nontoxic multinodular goiter: Secondary | ICD-10-CM | POA: Diagnosis not present

## 2019-05-05 ENCOUNTER — Other Ambulatory Visit: Payer: Self-pay

## 2019-05-05 ENCOUNTER — Encounter: Payer: Self-pay | Admitting: Urology

## 2019-05-05 ENCOUNTER — Ambulatory Visit (INDEPENDENT_AMBULATORY_CARE_PROVIDER_SITE_OTHER): Payer: Medicare Other | Admitting: Urology

## 2019-05-05 ENCOUNTER — Ambulatory Visit
Admission: RE | Admit: 2019-05-05 | Discharge: 2019-05-05 | Disposition: A | Discharge status: Home / Self Care | Payer: Medicare Other | Source: Ambulatory Visit | Attending: Urgent Care | Admitting: Urgent Care

## 2019-05-05 VITALS — BP 130/87 | HR 87 | Ht 67.0 in | Wt 213.0 lb

## 2019-05-05 DIAGNOSIS — Z79811 Long term (current) use of aromatase inhibitors: Secondary | ICD-10-CM

## 2019-05-05 DIAGNOSIS — N3946 Mixed incontinence: Secondary | ICD-10-CM | POA: Diagnosis not present

## 2019-05-05 DIAGNOSIS — R32 Unspecified urinary incontinence: Secondary | ICD-10-CM

## 2019-05-05 DIAGNOSIS — M810 Age-related osteoporosis without current pathological fracture: Secondary | ICD-10-CM

## 2019-05-05 DIAGNOSIS — Z780 Asymptomatic menopausal state: Secondary | ICD-10-CM | POA: Diagnosis not present

## 2019-05-05 DIAGNOSIS — M8588 Other specified disorders of bone density and structure, other site: Secondary | ICD-10-CM | POA: Diagnosis not present

## 2019-05-05 LAB — URINALYSIS, COMPLETE
Bilirubin, UA: NEGATIVE
Glucose, UA: NEGATIVE
Nitrite, UA: NEGATIVE
RBC, UA: NEGATIVE
Specific Gravity, UA: 1.025 (ref 1.005–1.030)
Urobilinogen, Ur: 1 mg/dL (ref 0.2–1.0)
pH, UA: 5 (ref 5.0–7.5)

## 2019-05-05 LAB — MICROSCOPIC EXAMINATION: RBC: NONE SEEN /hpf (ref 0–2)

## 2019-05-05 MED ORDER — MIRABEGRON ER 50 MG PO TB24: 50.0000 mg | ORAL_TABLET | Freq: Every day | ORAL | 11 refills | Status: DC

## 2019-05-05 NOTE — Progress Notes (Signed)
05/05/2019 3:39 PM   Bucks 06/25/1946 [id number]  Referring provider: Steele Sizer, MD [address] [place],  [place] [address]  Chief Complaint  Patient presents with  . Urinary Incontinence    HPI: Insulted by the above provider to assist the patient's urge incontinence worsening over many months.  She has urge incontinence wearing 1 pad a day damp.  She denies stress incontinence bedwetting.  She voids every 2-3 hours and gets up 3-4 times a night   She has no ankle edema.  She does not take a diuretic.  She denies history of kidney stones previous GU surgery and bladder infections.  She has had low back surgery many years ago.  She has had a hysterectomy.  Bowel movements normal.  She did not want physical therapy had a primary care level  Modifying factors: There are no other modifying factors  Associated signs and symptoms: There are no other associated signs and symptoms Aggravating and relieving factors: There are no other aggravating or relieving factors Severity: Moderate Duration: Persistent   PMH: Past Medical History:  Diagnosis Date  . Allergy   . Arthritis    knees, lower back  . Breast cancer ([place]) right   2011, T1A N0, radiation  . Esophageal reflux   . Hodgkin's disease ([place]) 2004  . Hypertension 2006  . Osteoporosis   . Personal history of malignant neoplasm of breast 2011   right breast  . Personal history of radiation therapy 2011   RIGHT lumpectomy w/ radiation    Surgical History: Past Surgical History:  Procedure Laterality Date  . ABDOMINAL HYSTERECTOMY    . BACK SURGERY    . BREAST BIOPSY Right 2011   radiation  . BREAST LUMPECTOMY Right 2011   w/ radiation  . BREAST SURGERY Right 2011   right breast lumpectomy,L/SN/R for right breast CA   . CATARACT EXTRACTION W/PHACO Left 06/10/2018   Procedure: CATARACT EXTRACTION PHACO AND INTRAOCULAR LENS PLACEMENT ([place]) LEFT;  Surgeon: Eulogio Bear, MD;   Location: [facility];  Service: Ophthalmology;  Laterality: Left;  Diabetic - oral meds  . CATARACT EXTRACTION W/PHACO Right 07/15/2018   Procedure: CATARACT EXTRACTION PHACO AND INTRAOCULAR LENS PLACEMENT ([place]) RIGHT;  Surgeon: Eulogio Bear, MD;  Location: [facility];  Service: Ophthalmology;  Laterality: Right;  . COLONOSCOPY  2003  . PORT A CATH REVISION    . WRIST SURGERY  2005    Home Medications:  Allergies as of 05/05/2019      Reactions   Shrimp [shellfish Allergy] Hives   Augmentin [amoxicillin-pot Clavulanate] Nausea Only      Medication List       Accurate as of May 05, 2019  3:39 PM. If you have any questions, ask your nurse or doctor.        STOP taking these medications   fluticasone 50 MCG/ACT nasal spray Commonly known as: FLONASE Stopped by: [name], MD     TAKE these medications   acetaminophen 650 MG CR tablet Commonly known as: TYLENOL Take 1,300 mg by mouth every 8 (eight) hours as needed for pain.   amLODipine 5 MG tablet Commonly known as: NORVASC Take 1 tablet (5 mg total) by mouth daily.   anastrozole 1 MG tablet Commonly known as: ARIMIDEX TAKE 1 TABLET BY MOUTH ONCE DAILY   aspirin 81 MG tablet Take 81 mg by mouth daily.   CALCIUM-MAGNESIUM PO Take 1 tablet by mouth daily.  cetirizine 10 MG tablet Commonly known as: ZYRTEC Take 10 mg by mouth daily.   denosumab 60 MG/ML Sosy injection Commonly known as: PROLIA Inject 60 mg into the skin every 6 (six) months.   meloxicam 15 MG tablet Commonly known as: MOBIC Take 1 tablet (15 mg total) by mouth daily.   metFORMIN 750 MG 24 hr tablet Commonly known as: GLUCOPHAGE-XR Take 1 tablet (750 mg total) by mouth daily with breakfast.   metoprolol succinate 50 MG 24 hr tablet Commonly known as: TOPROL-XL Take 1 tablet by mouth once daily   mirabegron ER 50 MG Tb24 tablet Commonly known as: MYRBETRIQ Take 1 tablet (50 mg total) by mouth daily. Started  by: [name], MD   multivitamin tablet Take 1 tablet by mouth daily.   nystatin powder Commonly known as: MYCOSTATIN/NYSTOP Apply topically 2 (two) times daily.   pantoprazole 40 MG tablet Commonly known as: PROTONIX Take 1 tablet (40 mg total) by mouth daily.   VITAMIN D-3 PO Take 1 tablet by mouth daily.       Allergies:  Allergies  Allergen Reactions  . Shrimp [Shellfish Allergy] Hives  . Augmentin [Amoxicillin-Pot Clavulanate] Nausea Only    Family History: Family History  Problem Relation Age of Onset  . Cancer Father   . Bone cancer Father   . Breast cancer Mother 82  . Stroke Mother   . Colon cancer Maternal Grandmother   . Breast cancer Cousin     Social History:  reports that she has never smoked. She has never used smokeless tobacco. She reports current alcohol use of about 2.0 standard drinks of alcohol per week. She reports that she does not use drugs.  ROS: UROLOGY Frequent Urination?: Yes Hard to postpone urination?: No Burning/pain with urination?: No Get up at night to urinate?: Yes Leakage of urine?: Yes Urine stream starts and stops?: No Trouble starting stream?: No Do you have to strain to urinate?: No Blood in urine?: No Urinary tract infection?: No Sexually transmitted disease?: No Injury to kidneys or bladder?: No Painful intercourse?: No Weak stream?: No Currently pregnant?: No Vaginal bleeding?: No Last menstrual period?: n  Gastrointestinal Nausea?: No Vomiting?: No Indigestion/heartburn?: No Diarrhea?: No Constipation?: No  Constitutional Fever: No Night sweats?: No Weight loss?: No Fatigue?: No  Skin Skin rash/lesions?: No Itching?: No  Eyes Blurred vision?: No Double vision?: No  Ears/Nose/Throat Sore throat?: No Sinus problems?: No  Hematologic/Lymphatic Swollen glands?: No Easy bruising?: No  Cardiovascular Leg swelling?: No  Respiratory Cough?: No Shortness of breath?: No  Endocrine  Excessive thirst?: No  Musculoskeletal Back pain?: No Joint pain?: No  Neurological Headaches?: No Dizziness?: No  Psychologic Depression?: No Anxiety?: No  Physical Exam: BP 130/87   Pulse 87   Ht 5\' 7"  (1.702 m)   Wt 213 lb (96.6 kg)   BMI 33.36 kg/m   Constitutional:  Alert and oriented, No acute distress. HEENT: [place] AT, moist mucus membranes.  Trachea midline, no masses. Cardiovascular: No clubbing, cyanosis, or edema. Respiratory: Normal respiratory effort, no increased work of breathing. GI: Abdomen is soft, nontender, nondistended, no abdominal masses GU: The patient had mild descensus at rest with grade 2 hypermobility the bladder neck and minimal movement.  No stress incontinence.  Small high grade 1 cystocele.  She had a suburethral swelling nontender and she likely does not have a diverticulum Skin: No rashes, bruises or suspicious lesions. Lymph: No cervical or inguinal adenopathy. Neurologic: Grossly intact, no focal deficits, moving  all 4 extremities. Psychiatric: Normal mood and affect.  Laboratory Data: Lab Results  Component Value Date   WBC 9.3 02/25/2019   HGB 12.7 02/25/2019   HCT 37.7 02/25/2019   MCV 92.4 02/25/2019   PLT 219 02/25/2019    Lab Results  Component Value Date   CREATININE 0.55 02/25/2019    No results found for: PSA  No results found for: TESTOSTERONE  Lab Results  Component Value Date   HGBA1C 5.6 07/03/2018    Urinalysis    Component Value Date/Time   COLORURINE YELLOW (A) 12/04/2018 0906   APPEARANCEUR CLEAR (A) 12/04/2018 0906   APPEARANCEUR Hazy 03/19/2012 2152   LABSPEC 1.009 12/04/2018 0906   LABSPEC 1.026 03/19/2012 2152   PHURINE 6.0 12/04/2018 0906   GLUCOSEU NEGATIVE 12/04/2018 0906   GLUCOSEU Negative 03/19/2012 2152   HGBUR NEGATIVE 12/04/2018 0906   BILIRUBINUR NEGATIVE 12/04/2018 0906   BILIRUBINUR Negative 03/19/2012 2152   KETONESUR NEGATIVE 12/04/2018 0906   PROTEINUR NEGATIVE 12/04/2018 0906    NITRITE NEGATIVE 12/04/2018 0906   LEUKOCYTESUR TRACE (A) 12/04/2018 0906   LEUKOCYTESUR Trace 03/19/2012 2152    Pertinent Imaging:   Assessment & Plan: Patient has mild urge incontinence and moderate nighttime frequency.  Pathophysiology described.  I will reassess the patient on Myrbetriq.  Desmopressin is a future option.  Assessed on Myrbetriq in about 6 weeks.  She said her primary most bothersome symptom is the nighttime frequency.  Percutaneous tibial nerve stimulation is another option  There are no diagnoses linked to this encounter.  No follow-ups on file.  [name], MD  [facility] [address], [place], [place] [address] [phone]

## 2019-05-08 DIAGNOSIS — E042 Nontoxic multinodular goiter: Secondary | ICD-10-CM | POA: Diagnosis not present

## 2019-05-13 ENCOUNTER — Other Ambulatory Visit: Payer: Self-pay | Admitting: Family Medicine

## 2019-05-13 DIAGNOSIS — M170 Bilateral primary osteoarthritis of knee: Secondary | ICD-10-CM

## 2019-05-15 DIAGNOSIS — M170 Bilateral primary osteoarthritis of knee: Secondary | ICD-10-CM | POA: Diagnosis not present

## 2019-05-20 ENCOUNTER — Encounter: Payer: Self-pay | Admitting: General Surgery

## 2019-05-27 ENCOUNTER — Other Ambulatory Visit: Payer: Self-pay | Admitting: *Deleted

## 2019-05-27 DIAGNOSIS — C50211 Malignant neoplasm of upper-inner quadrant of right female breast: Secondary | ICD-10-CM

## 2019-05-27 DIAGNOSIS — Z170 Estrogen receptor positive status [ER+]: Secondary | ICD-10-CM

## 2019-05-27 DIAGNOSIS — Z1231 Encounter for screening mammogram for malignant neoplasm of breast: Secondary | ICD-10-CM

## 2019-06-30 ENCOUNTER — Ambulatory Visit (INDEPENDENT_AMBULATORY_CARE_PROVIDER_SITE_OTHER): Payer: Medicare Other | Admitting: Urology

## 2019-06-30 ENCOUNTER — Other Ambulatory Visit: Payer: Self-pay

## 2019-06-30 ENCOUNTER — Encounter: Payer: Self-pay | Admitting: Urology

## 2019-06-30 VITALS — BP 164/93 | HR 87 | Ht 67.0 in | Wt 216.0 lb

## 2019-06-30 DIAGNOSIS — R351 Nocturia: Secondary | ICD-10-CM | POA: Diagnosis not present

## 2019-06-30 MED ORDER — OXYBUTYNIN CHLORIDE ER 10 MG PO TB24: 10.0000 mg | ORAL_TABLET | Freq: Every day | ORAL | 11 refills | Status: DC

## 2019-06-30 MED ORDER — TOLTERODINE TARTRATE ER 4 MG PO CP24: 4.0000 mg | ORAL_CAPSULE | Freq: Every day | ORAL | 11 refills | Status: DC

## 2019-06-30 NOTE — Progress Notes (Signed)
06/30/2019 10:39 AM   Mary Todd [id number]  Referring provider: Steele Sizer, MD [address] [place],  [place] [address]  Chief Complaint  Patient presents with  . Follow-up    HPI: I was consulted by the above provider to assist the patient's urge incontinence worsening over many months.  She has urge incontinence wearing 1 pad a day damp.  She denies stress incontinence bedwetting.  She voids every 2-3 hours and gets up 3-4 times a night    The patient had mild descensus at rest with grade 2 hypermobility the bladder neck and minimal movement.  No stress incontinence.  Small high grade 1 cystocele.  She had a suburethral swelling nontender and she likely does not have a diverticulum   Patient has mild urge incontinence and moderate nighttime frequency.  Pathophysiology described.  I will reassess the patient on Myrbetriq.  Desmopressin is a future option.  Assessed on Myrbetriq in about 6 weeks.  She said her primary most bothersome symptom is the nighttime frequency.  Percutaneous tibial nerve stimulation is another option  TOday Frequency and urgency improved during the day but still gets up at night similar amount.  Clinically not infected   PMH: Past Medical History:  Diagnosis Date  . Allergy   . Arthritis    knees, lower back  . Breast cancer ([place]) right   2011, T1A N0, radiation  . Esophageal reflux   . Hodgkin's disease ([place]) 2004  . Hypertension 2006  . Osteoporosis   . Personal history of malignant neoplasm of breast 2011   right breast  . Personal history of radiation therapy 2011   RIGHT lumpectomy w/ radiation    Surgical History: Past Surgical History:  Procedure Laterality Date  . ABDOMINAL HYSTERECTOMY    . BACK SURGERY    . BREAST BIOPSY Right 2011   radiation  . BREAST LUMPECTOMY Right 2011   w/ radiation  . BREAST SURGERY Right 2011   right breast lumpectomy,L/SN/R for right breast CA   . CATARACT  EXTRACTION W/PHACO Left 06/10/2018   Procedure: CATARACT EXTRACTION PHACO AND INTRAOCULAR LENS PLACEMENT ([facility]) LEFT;  Surgeon: Eulogio Bear, MD;  Location: [facility];  Service: Ophthalmology;  Laterality: Left;  Diabetic - oral meds  . CATARACT EXTRACTION W/PHACO Right 07/15/2018   Procedure: CATARACT EXTRACTION PHACO AND INTRAOCULAR LENS PLACEMENT ([facility]) RIGHT;  Surgeon: Eulogio Bear, MD;  Location: [facility];  Service: Ophthalmology;  Laterality: Right;  . COLONOSCOPY  2003  . PORT A CATH REVISION    . WRIST SURGERY  2005    Home Medications:  Allergies as of 06/30/2019      Reactions   Shrimp [shellfish Allergy] Hives   Augmentin [amoxicillin-pot Clavulanate] Nausea Only      Medication List       Accurate as of June 30, 2019 10:39 AM. If you have any questions, ask your nurse or doctor.        STOP taking these medications   metFORMIN 750 MG 24 hr tablet Commonly known as: GLUCOPHAGE-XR Stopped by: [name], MD     TAKE these medications   acetaminophen 650 MG CR tablet Commonly known as: TYLENOL Take 1,300 mg by mouth every 8 (eight) hours as needed for pain.   amLODipine 5 MG tablet Commonly known as: NORVASC Take 1 tablet (5 mg total) by mouth daily.   anastrozole 1 MG tablet Commonly known as: ARIMIDEX TAKE 1 TABLET BY MOUTH  ONCE DAILY   aspirin 81 MG tablet Take 81 mg by mouth daily.   CALCIUM-MAGNESIUM PO Take 1 tablet by mouth daily.   cetirizine 10 MG tablet Commonly known as: ZYRTEC Take 10 mg by mouth daily.   denosumab 60 MG/ML Sosy injection Commonly known as: PROLIA Inject 60 mg into the skin every 6 (six) months.   fluconazole 200 MG tablet Commonly known as: DIFLUCAN Take 200 mg by mouth once a week.   meloxicam 15 MG tablet Commonly known as: MOBIC Take 1 tablet by mouth once daily   metoprolol succinate 50 MG 24 hr tablet Commonly known as: TOPROL-XL Take 1 tablet by mouth once daily    mirabegron ER 50 MG Tb24 tablet Commonly known as: MYRBETRIQ Take 1 tablet (50 mg total) by mouth daily.   multivitamin tablet Take 1 tablet by mouth daily.   nystatin powder Commonly known as: MYCOSTATIN/NYSTOP Apply topically 2 (two) times daily.   pantoprazole 40 MG tablet Commonly known as: PROTONIX Take 1 tablet (40 mg total) by mouth daily.   VITAMIN D-3 PO Take 1 tablet by mouth daily.       Allergies:  Allergies  Allergen Reactions  . Shrimp [Shellfish Allergy] Hives  . Augmentin [Amoxicillin-Pot Clavulanate] Nausea Only    Family History: Family History  Problem Relation Age of Onset  . Cancer Father   . Bone cancer Father   . Breast cancer Mother 71  . Stroke Mother   . Colon cancer Maternal Grandmother   . Breast cancer Cousin     Social History:  reports that she has never smoked. She has never used smokeless tobacco. She reports current alcohol use of about 2.0 standard drinks of alcohol per week. She reports that she does not use drugs.  ROS: UROLOGY Frequent Urination?: No Hard to postpone urination?: No Burning/pain with urination?: No Get up at night to urinate?: Yes Leakage of urine?: No Urine stream starts and stops?: No Trouble starting stream?: No Do you have to strain to urinate?: No Blood in urine?: No Urinary tract infection?: No Sexually transmitted disease?: No Injury to kidneys or bladder?: No Painful intercourse?: No Weak stream?: No Currently pregnant?: No Vaginal bleeding?: No Last menstrual period?: n  Gastrointestinal Nausea?: No Vomiting?: No Indigestion/heartburn?: No Diarrhea?: No Constipation?: No  Constitutional Fever: No Night sweats?: No Weight loss?: No Fatigue?: No  Skin Skin rash/lesions?: No Itching?: No  Eyes Blurred vision?: No Double vision?: No  Ears/Nose/Throat Sore throat?: No Sinus problems?: No  Hematologic/Lymphatic Swollen glands?: No Easy bruising?: No  Cardiovascular Leg  swelling?: No Chest pain?: No  Respiratory Cough?: No Shortness of breath?: No  Endocrine Excessive thirst?: No  Musculoskeletal Back pain?: No Joint pain?: No  Neurological Headaches?: No Dizziness?: No  Psychologic Depression?: No Anxiety?: No  Physical Exam: BP (!) 164/93   Pulse 87   Ht 5\' 7"  (1.702 m)   Wt 216 lb (98 kg)   BMI 33.83 kg/m   Constitutional:  Alert and oriented, No acute distress.   Laboratory Data: Lab Results  Component Value Date   WBC 9.3 02/25/2019   HGB 12.7 02/25/2019   HCT 37.7 02/25/2019   MCV 92.4 02/25/2019   PLT 219 02/25/2019    Lab Results  Component Value Date   CREATININE 0.55 02/25/2019    No results found for: PSA  No results found for: TESTOSTERONE  Lab Results  Component Value Date   HGBA1C 5.6 07/03/2018    Urinalysis  Component Value Date/Time   COLORURINE YELLOW (A) 12/04/2018 0906   APPEARANCEUR Cloudy (A) 05/05/2019 1515   LABSPEC 1.009 12/04/2018 0906   LABSPEC 1.026 03/19/2012 2152   PHURINE 6.0 12/04/2018 0906   GLUCOSEU Negative 05/05/2019 1515   GLUCOSEU Negative 03/19/2012 2152   HGBUR NEGATIVE 12/04/2018 0906   BILIRUBINUR Negative 05/05/2019 1515   BILIRUBINUR Negative 03/19/2012 2152   KETONESUR NEGATIVE 12/04/2018 0906   PROTEINUR 1+ (A) 05/05/2019 1515   PROTEINUR NEGATIVE 12/04/2018 0906   NITRITE Negative 05/05/2019 1515   NITRITE NEGATIVE 12/04/2018 0906   LEUKOCYTESUR 1+ (A) 05/05/2019 1515   LEUKOCYTESUR Trace 03/19/2012 2152    Pertinent Imaging:   Assessment & Plan: The patient was given oxybutynin ER 10 mg and Detrol LA 4 mg and reassess in 8 weeks.  If she does not reach her goal I will discussed desmopressin recognizing potential cost issues and percutaneous tibial nerve stimulation for milder OAB symptoms and nighttime frequency  There are no diagnoses linked to this encounter.  No follow-ups on file.  [name], MD  [facility]  [address] [place], [place] [address] [phone]

## 2019-07-08 DIAGNOSIS — H318 Other specified disorders of choroid: Secondary | ICD-10-CM | POA: Diagnosis not present

## 2019-07-08 DIAGNOSIS — H35051 Retinal neovascularization, unspecified, right eye: Secondary | ICD-10-CM | POA: Diagnosis not present

## 2019-07-09 ENCOUNTER — Ambulatory Visit
Admission: RE | Admit: 2019-07-09 | Discharge: 2019-07-09 | Disposition: A | Discharge status: Home / Self Care | Payer: Medicare Other | Source: Ambulatory Visit | Attending: Surgery | Admitting: Surgery

## 2019-07-09 DIAGNOSIS — Z1231 Encounter for screening mammogram for malignant neoplasm of breast: Secondary | ICD-10-CM | POA: Diagnosis not present

## 2019-07-18 ENCOUNTER — Ambulatory Visit: Payer: Medicare Other | Admitting: Surgery

## 2019-07-21 ENCOUNTER — Other Ambulatory Visit: Payer: Self-pay | Admitting: Family Medicine

## 2019-07-21 DIAGNOSIS — I10 Essential (primary) hypertension: Secondary | ICD-10-CM

## 2019-07-22 ENCOUNTER — Ambulatory Visit (INDEPENDENT_AMBULATORY_CARE_PROVIDER_SITE_OTHER): Payer: Medicare Other | Admitting: Surgery

## 2019-07-22 ENCOUNTER — Encounter: Payer: Self-pay | Admitting: Surgery

## 2019-07-22 ENCOUNTER — Other Ambulatory Visit: Payer: Self-pay

## 2019-07-22 VITALS — BP 152/94 | HR 73 | Temp 97.5°F | Resp 16 | Ht 64.0 in | Wt 217.0 lb

## 2019-07-22 DIAGNOSIS — Z170 Estrogen receptor positive status [ER+]: Secondary | ICD-10-CM

## 2019-07-22 DIAGNOSIS — C50211 Malignant neoplasm of upper-inner quadrant of right female breast: Secondary | ICD-10-CM | POA: Diagnosis not present

## 2019-07-22 NOTE — Progress Notes (Signed)
07/22/2019  History of Present Illness: Mary Todd is a 73 y.o. female s/p right breast lumpectomy and SLNBx on 06/2010 with Dr. Jamal Collin.  She's also s/p radiation therapy to the right breast.  She presents today for follow up.  She denies any new masses, skin changes, or nipple changes.  Reports doing well otherwise.  She's been followed by Dr. Mike Gip and is currently on Arimidex.  Her most recent mammogram was on 07/09/19.  She does self exams.  Past Medical History: Past Medical History:  Diagnosis Date  . Allergy   . Arthritis    knees, lower back  . Breast cancer ([facility]) right   2011, T1A N0, radiation  . Esophageal reflux   . Hodgkin's disease ([place]) 2004  . Hypertension 2006  . Osteoporosis   . Personal history of malignant neoplasm of breast 2011   right breast  . Personal history of radiation therapy 2011   RIGHT lumpectomy w/ radiation     Past Surgical History: Past Surgical History:  Procedure Laterality Date  . ABDOMINAL HYSTERECTOMY    . BACK SURGERY    . BREAST BIOPSY Right 2011   radiation  . BREAST LUMPECTOMY Right 2011   w/ radiation  . BREAST SURGERY Right 2011   right breast lumpectomy,L/SN/R for right breast CA   . CATARACT EXTRACTION W/PHACO Left 06/10/2018   Procedure: CATARACT EXTRACTION PHACO AND INTRAOCULAR LENS PLACEMENT ([facility]) LEFT;  Surgeon: Eulogio Bear, MD;  Location: [facility];  Service: Ophthalmology;  Laterality: Left;  Diabetic - oral meds  . CATARACT EXTRACTION W/PHACO Right 07/15/2018   Procedure: CATARACT EXTRACTION PHACO AND INTRAOCULAR LENS PLACEMENT ([facility]) RIGHT;  Surgeon: Eulogio Bear, MD;  Location: [facility];  Service: Ophthalmology;  Laterality: Right;  . COLONOSCOPY  2003  . PORT A CATH REVISION    . WRIST SURGERY  2005    Home Medications: Prior to Admission medications   Medication Sig Start Date End Date Taking? Authorizing Provider  acetaminophen (TYLENOL) 650 MG CR tablet Take 1,300 mg  by mouth every 8 (eight) hours as needed for pain.   Yes [provider]  amLODipine (NORVASC) 5 MG tablet Take 1 tablet by mouth once daily 07/21/19  Yes Sowles, Drue Stager, MD  anastrozole (ARIMIDEX) 1 MG tablet TAKE 1 TABLET BY MOUTH ONCE DAILY 09/11/18  Yes Lequita Asal, MD  aspirin 81 MG tablet Take 81 mg by mouth daily.   Yes [provider]  CALCIUM-MAGNESIUM PO Take 1 tablet by mouth daily.   Yes [provider]  cetirizine (ZYRTEC) 10 MG tablet Take 10 mg by mouth daily.   Yes [provider]  Cholecalciferol (VITAMIN D-3 PO) Take 1 tablet by mouth daily.   Yes [provider]  denosumab (PROLIA) 60 MG/ML SOSY injection Inject 60 mg into the skin every 6 (six) months.   Yes Lequita Asal, MD  fluconazole (DIFLUCAN) 200 MG tablet Take 200 mg by mouth once a week. 06/19/19  Yes [provider]  meloxicam (MOBIC) 15 MG tablet Take 1 tablet by mouth once daily 05/13/19  Yes Sowles, Drue Stager, MD  metoprolol succinate (TOPROL-XL) 50 MG 24 hr tablet Take 1 tablet by mouth once daily 02/12/19  Yes Sowles, Drue Stager, MD  Multiple Vitamin (MULTIVITAMIN) tablet Take 1 tablet by mouth daily.   Yes [provider]  nystatin (MYCOSTATIN/NYSTOP) powder Apply topically 2 (two) times daily.   Yes [provider]  oxybutynin (DITROPAN-XL) 10 MG 24 hr  tablet Take 1 tablet (10 mg total) by mouth at bedtime. 06/30/19  Yes MacDiarmid, Nicki Reaper, MD  pantoprazole (PROTONIX) 40 MG tablet Take 1 tablet (40 mg total) by mouth daily. 12/24/18  Yes Sowles, Drue Stager, MD  mirabegron ER (MYRBETRIQ) 50 MG TB24 tablet Take 1 tablet (50 mg total) by mouth daily. 05/05/19   Bjorn Loser, MD  tolterodine (DETROL LA) 4 MG 24 hr capsule Take 1 capsule (4 mg total) by mouth daily. Patient not taking: Reported on 07/22/2019 06/30/19   Bjorn Loser, MD    Allergies: Allergies  Allergen Reactions  . Shrimp [Shellfish Allergy] Hives  . Augmentin  [Amoxicillin-Pot Clavulanate] Nausea Only    Review of Systems: Review of Systems  Constitutional: Negative for chills and fever.  Respiratory: Negative for shortness of breath.   Cardiovascular: Negative for chest pain.  Gastrointestinal: Negative for nausea and vomiting.  Skin: Negative for rash.    Physical Exam BP (!) 152/94   Pulse 73   Temp (!) 97.5 F (36.4 C) (Temporal)   Resp 16   Ht 5\' 4"  (1.626 m)   Wt 217 lb (98.4 kg)   SpO2 98%   BMI 37.25 kg/m  CONSTITUTIONAL: No acute distress HEENT:  Normocephalic, atraumatic, extraocular motion intact. RESPIRATORY:  Lungs are clear, and breath sounds are equal bilaterally. Normal respiratory effort without pathologic use of accessory muscles. CARDIOVASCULAR: Heart is regular without murmurs, gallops, or rubs. BREAST:  Right breast s/p lumpectomy in upper inner quadrant with scar well healed, with with an area of dermal thickening and telangiectasia.  No other palpable masses, skin changes, or nipple changes.  No right axillary or supraclavicular lymphadenopathy.  Left breast exam negative for any masses, skin changes, or nipple changes. No left axillary or supraclavicular lymphadenopathy. NEUROLOGIC:  Motor and sensation is grossly normal.  Cranial nerves are grossly intact. PSYCH:  Alert and oriented to person, place and time. Affect is normal.  Labs/Imaging: Mammogram 07/09/19: FINDINGS: There are no findings suspicious for malignancy. Images were processed with CAD.  IMPRESSION: No mammographic evidence of malignancy. A result letter of this screening mammogram will be mailed directly to the patient.  RECOMMENDATION: Screening mammogram in one year. (Code:SM-B-01Y)  BI-RADS CATEGORY  1: Negative.  Assessment and Plan: This is a 73 y.o. female s/p right breast lumpectomy and SLNBx in 2011  Discussed with patient that she's doing very well without any new findings and with negative mammogram.  Discussed with her  that at this point, we no longer needs to follow up with [place] and we can defer future mammograms and exams to Dr. Mike Gip or her PCP Dr. Ancil Boozer.  However, if there are any new findings or anything suspicious that would warrant biopsy or excision, we are happy to see her in the future.  She understands this plan and is in agreement.  Face-to-face time spent with the patient and care providers was 15 minutes, with more than 50% of the time spent counseling, educating, and coordinating care of the patient.     Melvyn Neth, [facility]

## 2019-07-22 NOTE — Patient Instructions (Signed)
Please follow up with your primary care physician for future mammograms and breast exams.   Please call if you have questions or concerns.    Breast Self-Awareness Breast self-awareness is knowing how your breasts look and feel. Doing breast self-awareness is important. It allows you to catch a breast problem early while it is still small and can be treated. All women should do breast self-awareness, including women who have had breast implants. Tell your doctor if you notice a change in your breasts. What you need:  A mirror.  A well-lit room. How to do a breast self-exam A breast self-exam is one way to learn what is normal for your breasts and to check for changes. To do a breast self-exam: Look for changes  1. Take off all the clothes above your waist. 2. Stand in front of a mirror in a room with good lighting. 3. Put your hands on your hips. 4. Push your hands down. 5. Look at your breasts and nipples in the mirror to see if one breast or nipple looks different from the other. Check to see if: ? The shape of one breast is different. ? The size of one breast is different. ? There are wrinkles, dips, and bumps in one breast and not the other. 6. Look at each breast for changes in the skin, such as: ? Redness. ? Scaly areas. 7. Look for changes in your nipples, such as: ? Liquid around the nipples. ? Bleeding. ? Dimpling. ? Redness. ? A change in where the nipples are. Feel for changes  1. Lie on your back on the floor. 2. Feel each breast. To do this, follow these steps: ? Pick a breast to feel. ? Put the arm closest to that breast above your head. ? Use your other arm to feel the nipple area of your breast. Feel the area with the pads of your three middle fingers by making small circles with your fingers. For the first circle, press lightly. For the second circle, press harder. For the third circle, press even harder. ? Keep making circles with your fingers at the  different pressures as you move down your breast. Stop when you feel your ribs. ? Move your fingers a little toward the center of your body. ? Start making circles with your fingers again, this time going up until you reach your collarbone. ? Keep making up-and-down circles until you reach your armpit. Remember to keep using the three pressures. ? Feel the other breast in the same way. 3. Sit or stand in the tub or shower. 4. With soapy water on your skin, feel each breast the same way you did in step 2 when you were lying on the floor. Write down what you find Writing down what you find can help you remember what to tell your doctor. Write down:  What is normal for each breast.  Any changes you find in each breast, including: ? The kind of changes you find. ? Whether you have pain. ? Size and location of any lumps.  When you last had your menstrual period. General tips  Check your breasts every month.  If you are breastfeeding, the best time to check your breasts is after you feed your baby or after you use a breast pump.  If you get menstrual periods, the best time to check your breasts is 5-7 days after your menstrual period is over.  With time, you will become comfortable with the self-exam, and you will begin  to know if there are changes in your breasts. Contact a doctor if you:  See a change in the shape or size of your breasts or nipples.  See a change in the skin of your breast or nipples, such as red or scaly skin.  Have fluid coming from your nipples that is not normal.  Find a lump or thick area that was not there before.  Have pain in your breasts.  Have any concerns about your breast health. Summary  Breast self-awareness includes looking for changes in your breasts, as well as feeling for changes within your breasts.  Breast self-awareness should be done in front of a mirror in a well-lit room.  You should check your breasts every month. If you get  menstrual periods, the best time to check your breasts is 5-7 days after your menstrual period is over.  Let your doctor know of any changes you see in your breasts, including changes in size, changes on the skin, pain or tenderness, or fluid from your nipples that is not normal. This information is not intended to replace advice given to you by your health care provider. Make sure you discuss any questions you have with your health care provider. Document Released: 04/03/2008 Document Revised: 06/04/2018 Document Reviewed: 06/04/2018 Elsevier Patient Education  2020 [employer].

## 2019-07-28 ENCOUNTER — Ambulatory Visit (INDEPENDENT_AMBULATORY_CARE_PROVIDER_SITE_OTHER): Payer: Medicare Other | Admitting: Family Medicine

## 2019-07-28 ENCOUNTER — Encounter: Payer: Self-pay | Admitting: Family Medicine

## 2019-07-28 ENCOUNTER — Other Ambulatory Visit: Payer: Self-pay

## 2019-07-28 DIAGNOSIS — M170 Bilateral primary osteoarthritis of knee: Secondary | ICD-10-CM

## 2019-07-28 DIAGNOSIS — E041 Nontoxic single thyroid nodule: Secondary | ICD-10-CM

## 2019-07-28 DIAGNOSIS — M810 Age-related osteoporosis without current pathological fracture: Secondary | ICD-10-CM | POA: Diagnosis not present

## 2019-07-28 DIAGNOSIS — R7303 Prediabetes: Secondary | ICD-10-CM

## 2019-07-28 DIAGNOSIS — I10 Essential (primary) hypertension: Secondary | ICD-10-CM

## 2019-07-28 DIAGNOSIS — R32 Unspecified urinary incontinence: Secondary | ICD-10-CM | POA: Diagnosis not present

## 2019-07-28 DIAGNOSIS — K219 Gastro-esophageal reflux disease without esophagitis: Secondary | ICD-10-CM | POA: Diagnosis not present

## 2019-07-28 MED ORDER — MELOXICAM 15 MG PO TABS: 15.0000 mg | ORAL_TABLET | Freq: Every day | ORAL | 1 refills | Status: DC

## 2019-07-28 MED ORDER — METOPROLOL SUCCINATE ER 50 MG PO TB24: 50.0000 mg | ORAL_TABLET | Freq: Every day | ORAL | 1 refills | Status: DC

## 2019-07-28 MED ORDER — AMLODIPINE BESYLATE-VALSARTAN 5-160 MG PO TABS: 1.0000 | ORAL_TABLET | Freq: Every day | ORAL | 0 refills | Status: DC

## 2019-07-28 MED ORDER — PANTOPRAZOLE SODIUM 40 MG PO TBEC: 40.0000 mg | DELAYED_RELEASE_TABLET | Freq: Every day | ORAL | 1 refills | Status: DC

## 2019-07-28 NOTE — Progress Notes (Signed)
Name: Mary Todd   MRN: [id number]    DOB: 12-Apr-1946   Date:07/28/2019       Progress Note  Subjective  Chief Complaint  Chief Complaint  Patient presents with  . Hypertension  . Prediabetes  . Gastroesophageal Reflux    I connected with  Leana Gamer Bittel  on 07/28/19 at  9:00 AM EDT by a video enabled telemedicine application and verified that I am speaking with the correct person using two identifiers.  I discussed the limitations of evaluation and management by telemedicine and the availability of in person appointments. The patient expressed understanding and agreed to proceed. Staff also discussed with the patient that there may be a patient responsible charge related to this service. Patient Location: at home  Provider Location: [facility]   HPI  Pre-diabetes: she denies polyphagia, polydipsia or polyuria,she stopped taking Metformin because of the recall, last A1C was normal   Urge incontinence:. She saw Urologist this past Summer and was given Myrbetriq , but it did not decrease nighttime symptoms, switched to Ditropan  and states she is doing better, getting up at most 2 times per night, in the past 4-5 times per night.   HTN: taking medication as prescribed,she had been off Norvasc because of episode of syncope ( likely from dehydration) but is back taking Norvasc 5 mg and back on metoprolol XL 50 mg and bp at recent visits at urologist and surgeon was elevated, also elevated at home lately, but not able to check it today. She states she has gained some weight. We will add diovan  GERD:she tried stopping Pantoprazole but as soon as she went off medication symptoms became severe, with heartburn, and regurgitation, andhas been taking it daily again.  Unchanged  OA: taking Meloxicam daily, explained that it can make GERD worse, she tried stopping Meloxicam but pain was too intense, she states her whole body ached, so she went back to once  daily.She is also taking Tylenol, she was seen by Ortho recently and had both knees injection with steroid about 2 months ago  RightBreast Cancer: under the care of Dr. Mike Gip and now Dr. Fleet Contras. Also diagnosed with hodgkin's lymphoma in 2004, breast cancer diagnosed in 2011still on Arimidex until 2021. Had to start Prolia for osteoporosis, found on bone density of spine, she is tolerating medications well, last mammogram was normal- Fall 2020 . Last Prolia infusion was 01/2019. Going back in [place]  Thyroid nodules: incidental finding on CT neck , had biopsy by Dr. Gabriel Carina Dec 2017 and biopsy was negative, still has yearly follow ups with her. She usually goes in July   Obesity: she has long history of obesity, she isoff  Metformin, weight has gone up since March. Decreasing portion size.She has not been as active since COVID-19 , also staying at home and snacking more often    Patient Active Problem List   Diagnosis Date Noted  . Encounter for screening mammogram for breast cancer 07/02/2018  . Anterolisthesis 08/06/2017  . DDD (degenerative disc disease), lumbosacral 08/06/2017  . Chondromalacia patellae 07/03/2017  . Osteoporosis 12/14/2016  . Morbid obesity ([place]) 07/17/2016  . Spinal stenosis in cervical region 05/22/2016  . Thyroid nodule 05/22/2016  . GERD without esophagitis 03/16/2016  . Neck muscle spasm 03/16/2016  . History of back surgery 03/16/2016  . Osteoarthritis of both knees 03/16/2016  . Large breasts 03/16/2016  . Hypertension, benign 03/16/2016  . Perennial allergic rhinitis with seasonal variation 03/16/2016  .  History of pneumococcal pneumonia 09/20/2015  . Hodgkin disease ([place]) 07/15/2014  . Breast cancer, right ([place]) 06/26/2013    Past Surgical History:  Procedure Laterality Date  . ABDOMINAL HYSTERECTOMY    . BACK SURGERY    . BREAST BIOPSY Right 2011   radiation  . BREAST LUMPECTOMY Right 2011   w/ radiation  . BREAST SURGERY Right  2011   right breast lumpectomy,L/SN/R for right breast CA   . CATARACT EXTRACTION W/PHACO Left 06/10/2018   Procedure: CATARACT EXTRACTION PHACO AND INTRAOCULAR LENS PLACEMENT ([place]) LEFT;  Surgeon: Eulogio Bear, MD;  Location: [place];  Service: Ophthalmology;  Laterality: Left;  Diabetic - oral meds  . CATARACT EXTRACTION W/PHACO Right 07/15/2018   Procedure: CATARACT EXTRACTION PHACO AND INTRAOCULAR LENS PLACEMENT ([place]) RIGHT;  Surgeon: Eulogio Bear, MD;  Location: [place];  Service: Ophthalmology;  Laterality: Right;  . COLONOSCOPY  2003  . PORT A CATH REVISION    . WRIST SURGERY  2005    Family History  Problem Relation Age of Onset  . Cancer Father   . Bone cancer Father   . Breast cancer Mother 72  . Stroke Mother   . Colon cancer Maternal Grandmother   . Breast cancer Cousin     Social History   Socioeconomic History  . Marital status: Married    Spouse name: [name]  . Number of children: 2  . Years of education: some college  . Highest education level: 12th grade  Occupational History  . Occupation: Retired  Scientific laboratory technician  . Financial resource strain: Not hard at all  . Food insecurity    Worry: Never true    Inability: Never true  . Transportation needs    Medical: No    Non-medical: No  Tobacco Use  . Smoking status: Never Smoker  . Smokeless tobacco: Never Used  . Tobacco comment: smoking cessation materials not required  Substance and Sexual Activity  . Alcohol use: Yes    Alcohol/week: 2.0 standard drinks    Types: 2 Glasses of wine per week    Comment: occassionally  . Drug use: No  . Sexual activity: Not Currently  Lifestyle  . Physical activity    Days per week: 0 days    Minutes per session: 0 min  . Stress: Not at all  Relationships  . Social connections    Talks on phone: More than three times a week    Gets together: More than three times a week    Attends religious service: More than 4 times per year     Active member of club or organization: No    Attends meetings of clubs or organizations: Never    Relationship status: Married  . Intimate partner violence    Fear of current or ex partner: No    Emotionally abused: No    Physically abused: No    Forced sexual activity: No  Other Topics Concern  . Not on file  Social History Narrative  . Not on file     Current Outpatient Medications:  .  acetaminophen (TYLENOL) 650 MG CR tablet, Take 1,300 mg by mouth every 8 (eight) hours as needed for pain., Disp: , Rfl:  .  amLODipine (NORVASC) 5 MG tablet, Take 1 tablet by mouth once daily, Disp: 90 tablet, Rfl: 0 .  anastrozole (ARIMIDEX) 1 MG tablet, TAKE 1 TABLET BY MOUTH ONCE DAILY, Disp: 90 tablet, Rfl: 3 .  aspirin 81 MG tablet, Take  81 mg by mouth daily., Disp: , Rfl:  .  CALCIUM-MAGNESIUM PO, Take 1 tablet by mouth daily., Disp: , Rfl:  .  cetirizine (ZYRTEC) 10 MG tablet, Take 10 mg by mouth daily., Disp: , Rfl:  .  Cholecalciferol (VITAMIN D-3 PO), Take 1 tablet by mouth daily., Disp: , Rfl:  .  denosumab (PROLIA) 60 MG/ML SOSY injection, Inject 60 mg into the skin every 6 (six) months., Disp: , Rfl:  .  fluconazole (DIFLUCAN) 200 MG tablet, Take 200 mg by mouth once a week., Disp: , Rfl:  .  meloxicam (MOBIC) 15 MG tablet, Take 1 tablet by mouth once daily, Disp: 90 tablet, Rfl: 0 .  metoprolol succinate (TOPROL-XL) 50 MG 24 hr tablet, Take 1 tablet by mouth once daily, Disp: 90 tablet, Rfl: 0 .  Multiple Vitamin (MULTIVITAMIN) tablet, Take 1 tablet by mouth daily., Disp: , Rfl:  .  nystatin (MYCOSTATIN/NYSTOP) powder, Apply topically 2 (two) times daily., Disp: , Rfl:  .  oxybutynin (DITROPAN-XL) 10 MG 24 hr tablet, Take 1 tablet (10 mg total) by mouth at bedtime., Disp: 30 tablet, Rfl: 11 .  pantoprazole (PROTONIX) 40 MG tablet, Take 1 tablet (40 mg total) by mouth daily., Disp: 90 tablet, Rfl: 1 .  tolterodine (DETROL LA) 4 MG 24 hr capsule, Take 1 capsule (4 mg total) by mouth  daily. (Patient not taking: Reported on 07/28/2019), Disp: 30 capsule, Rfl: 11  Allergies  Allergen Reactions  . Shrimp [Shellfish Allergy] Hives  . Augmentin [Amoxicillin-Pot Clavulanate] Nausea Only    I personally reviewed active problem list, medication list, allergies, family history, social history with the patient/caregiver today.   ROS  Ten systems reviewed and is negative except as mentioned in HPI   Objective  Virtual encounter, vitals not obtained.  There is no height or weight on file to calculate BMI.  Physical Exam  Awake, alert and oriented , no distress  PHQ2/9: Depression screen [facility] 2/9 07/28/2019 03/25/2019 12/27/2018 12/24/2018 12/06/2018  Decreased Interest 0 0 0 0 0  Down, Depressed, Hopeless 0 1 0 0 0  PHQ - 2 Score 0 1 0 0 0  Altered sleeping 0 0 - - 0  Tired, decreased energy 0 0 - - 0  Change in appetite 0 0 - - 0  Feeling bad or failure about yourself  0 0 - - 0  Trouble concentrating 0 0 - - 0  Moving slowly or fidgety/restless 0 0 - - 0  Suicidal thoughts 0 0 - - 0  PHQ-9 Score 0 1 - - 0  Difficult doing work/chores - Not difficult at all - - Not difficult at all   PHQ-2/9 Result is negative.    Fall Risk: Fall Risk  07/28/2019 07/22/2019 03/25/2019 12/27/2018 12/24/2018  Todd in the past year? 0 0 0 0 0  Number Todd in past yr: 0 - 0 0 0  Injury with Fall? 0 - 0 0 0  Follow up - - - Todd prevention discussed -     Assessment & Plan  1. Essential hypertension  - metoprolol succinate (TOPROL-XL) 50 MG 24 hr tablet; Take 1 tablet (50 mg total) by mouth daily. Take with or immediately following a meal.  Dispense: 90 tablet; Refill: 1 - amLODipine-valsartan (EXFORGE) 5-160 MG tablet; Take 1 tablet by mouth daily.  Dispense: 90 tablet; Refill: 0 - COMPLETE METABOLIC PANEL WITH GFR - Lipid panel  2. Pre-diabetes  - Hemoglobin A1c  3. GERD without esophagitis  -  pantoprazole (PROTONIX) 40 MG tablet; Take 1 tablet (40 mg total) by mouth  daily.  Dispense: 90 tablet; Refill: 1  4. Primary osteoarthritis of both knees  - meloxicam (MOBIC) 15 MG tablet; Take 1 tablet (15 mg total) by mouth daily.  Dispense: 90 tablet; Refill: 1  5. Age-related osteoporosis without current pathological fracture   6. Morbid obesity, unspecified obesity type [facility])  Discussed with the patient the risk posed by an increased BMI. Discussed importance of portion control, calorie counting and at least 150 minutes of physical activity weekly. Avoid sweet beverages and drink more water. Eat at least 6 servings of fruit and vegetables daily   7. Incontinence in female  improved  8. Thyroid nodule  - TSH  I discussed the assessment and treatment plan with the patient. The patient was provided an opportunity to ask questions and all were answered. The patient agreed with the plan and demonstrated an understanding of the instructions.  The patient was advised to call back or seek an in-person evaluation if the symptoms worsen or if the condition fails to improve as anticipated.  I provided 25 minutes of non-face-to-face time during this encounter.

## 2019-08-11 ENCOUNTER — Other Ambulatory Visit: Payer: Self-pay

## 2019-08-11 ENCOUNTER — Ambulatory Visit (INDEPENDENT_AMBULATORY_CARE_PROVIDER_SITE_OTHER): Payer: Medicare Other

## 2019-08-11 DIAGNOSIS — E041 Nontoxic single thyroid nodule: Secondary | ICD-10-CM | POA: Diagnosis not present

## 2019-08-11 DIAGNOSIS — I10 Essential (primary) hypertension: Secondary | ICD-10-CM | POA: Diagnosis not present

## 2019-08-11 DIAGNOSIS — R7303 Prediabetes: Secondary | ICD-10-CM | POA: Diagnosis not present

## 2019-08-11 DIAGNOSIS — Z23 Encounter for immunization: Secondary | ICD-10-CM

## 2019-08-11 NOTE — Progress Notes (Signed)
Patient is here for a blood pressure check. Patient denies chest pain, palpitations, shortness of breath or visual disturbances. At previous visit blood pressure was 138/86. Today during nurse visit first check blood pressure was 134/88. . She does take any blood pressure medications.

## 2019-08-12 LAB — COMPLETE METABOLIC PANEL WITH GFR
AG Ratio: 1.4 (calc) (ref 1.0–2.5)
ALT: 24 U/L (ref 6–29)
AST: 20 U/L (ref 10–35)
Albumin: 4.1 g/dL (ref 3.6–5.1)
Alkaline phosphatase (APISO): 74 U/L (ref 37–153)
BUN: 19 mg/dL (ref 7–25)
CO2: 29 mmol/L (ref 20–32)
Calcium: 9.6 mg/dL (ref 8.6–10.4)
Chloride: 103 mmol/L (ref 98–110)
Creat: 0.65 mg/dL (ref 0.60–0.93)
GFR, Est African American: 103 mL/min/{1.73_m2} (ref >=60)
GFR, Est Non African American: 89 mL/min/{1.73_m2} (ref >=60)
Globulin: 2.9 g/dL (calc) (ref 1.9–3.7)
Glucose, Bld: 85 mg/dL (ref 65–99)
Potassium: 4.1 mmol/L (ref 3.5–5.3)
Sodium: 138 mmol/L (ref 135–146)
Total Bilirubin: 0.6 mg/dL (ref 0.2–1.2)
Total Protein: 7 g/dL (ref 6.1–8.1)

## 2019-08-12 LAB — HEMOGLOBIN A1C
Hgb A1c MFr Bld: 5.6 % of total Hgb (ref <5.7)
Mean Plasma Glucose: 114 (calc)
eAG (mmol/L): 6.3 (calc)

## 2019-08-12 LAB — LIPID PANEL
Cholesterol: 195 mg/dL (ref <200)
HDL: 76 mg/dL (ref >=50)
LDL Cholesterol (Calc): 100 mg/dL (calc) — ABNORMAL HIGH
Non-HDL Cholesterol (Calc): 119 mg/dL (calc) (ref <130)
Total CHOL/HDL Ratio: 2.6 (calc) (ref <5.0)
Triglycerides: 93 mg/dL (ref <150)

## 2019-08-12 LAB — TSH: TSH: 0.6 mIU/L (ref 0.40–4.50)

## 2019-08-22 ENCOUNTER — Telehealth: Payer: Self-pay | Admitting: Family Medicine

## 2019-08-22 NOTE — Chronic Care Management (AMB) (Signed)
Chronic Care Management   Note  08/22/2019 Name: Mary Todd MRN: [id number] DOB: February 05, 1946  Michalina Calbert is a 73 y.o. year old female who is a primary care patient of Steele Sizer, MD. I reached out to Anner Crete by phone today in response to a referral sent by Ms. Leana Gamer Stanbery's health plan.     Ms. Daidone was given information about Chronic Care Management services today including:  1. CCM service includes personalized support from designated clinical staff supervised by her physician, including individualized plan of care and coordination with other care providers 2. 24/7 contact phone numbers for assistance for urgent and routine care needs. 3. Service will only be billed when office clinical staff spend 20 minutes or more in a month to coordinate care. 4. Only one practitioner may furnish and bill the service in a calendar month. 5. The patient may stop CCM services at any time (effective at the end of the month) by phone call to the office staff. 6. The patient will be responsible for cost sharing (co-pay) of up to 20% of the service fee (after annual deductible is met).  Patient agreed to services and verbal consent obtained.   Follow up plan: Telephone appointment with CCM team member scheduled for: 09/12/2019  [place]  ??[email or web address]   ??[phone]

## 2019-08-25 ENCOUNTER — Other Ambulatory Visit: Payer: Self-pay

## 2019-08-25 ENCOUNTER — Encounter: Payer: Self-pay | Admitting: Urology

## 2019-08-25 ENCOUNTER — Ambulatory Visit (INDEPENDENT_AMBULATORY_CARE_PROVIDER_SITE_OTHER): Payer: Medicare Other | Admitting: Urology

## 2019-08-25 VITALS — BP 151/85 | HR 88 | Ht 64.0 in | Wt 217.0 lb

## 2019-08-25 DIAGNOSIS — N3946 Mixed incontinence: Secondary | ICD-10-CM | POA: Diagnosis not present

## 2019-08-25 NOTE — Progress Notes (Signed)
08/25/2019 10:08 AM   Leana Gamer Pranger 1946/08/17 [id number]  Referring provider: Steele Sizer, MD [address] [place],  [place] [address]  Chief Complaint  Patient presents with  . Follow-up    HPI: I was consulted by the above provider to assist the patient's urge incontinence worsening over many months. She has urge incontinence wearing 1 pad a day damp. She denies stress incontinence bedwetting. She voids every 2-3 hours and gets up 3-4 times a night   The patient had mild descensus at rest with grade 2 hypermobility the bladder neck and minimal movement. No stress incontinence. Small high grade 1 cystocele. She had a suburethral swelling nontender and she likely does not have a diverticulum   Patient has mild urge incontinence and moderate nighttime frequency. Pathophysiology described. I will reassess the patient on Myrbetriq. Desmopressin is a future option. Assessed on Myrbetriq in about 6 weeks. She said her primary most bothersome symptom is the nighttime frequency. Percutaneous tibial nerve stimulation is another option  Frequency and urgency improved during the day but still gets up at night similar amount. The patient was given oxybutynin ER 10 mg and Detrol LA 4 mg and reassess in 8 weeks.  If she does not reach her goal I will discussed desmopressin recognizing potential cost issues and percutaneous tibial nerve stimulation for milder OAB symptoms and nighttime frequency  Today Frequency and urge incontinence and nocturia all better on tolterodine.  Oxybutynin did not help.  Gets up once a night depending on fluid intake.  Overall pleased.  Clinically not infected   PMH: Past Medical History:  Diagnosis Date  . Allergy   . Arthritis    knees, lower back  . Breast cancer ([place]) right   2011, T1A N0, radiation  . Esophageal reflux   . Hodgkin's disease ([place]) 2004  . Hypertension 2006  . Osteoporosis   . Personal history of  malignant neoplasm of breast 2011   right breast  . Personal history of radiation therapy 2011   RIGHT lumpectomy w/ radiation    Surgical History: Past Surgical History:  Procedure Laterality Date  . ABDOMINAL HYSTERECTOMY    . BACK SURGERY    . BREAST BIOPSY Right 2011   radiation  . BREAST LUMPECTOMY Right 2011   w/ radiation  . BREAST SURGERY Right 2011   right breast lumpectomy,L/SN/R for right breast CA   . CATARACT EXTRACTION W/PHACO Left 06/10/2018   Procedure: CATARACT EXTRACTION PHACO AND INTRAOCULAR LENS PLACEMENT ([place]) LEFT;  Surgeon: Eulogio Bear, MD;  Location: [place];  Service: Ophthalmology;  Laterality: Left;  Diabetic - oral meds  . CATARACT EXTRACTION W/PHACO Right 07/15/2018   Procedure: CATARACT EXTRACTION PHACO AND INTRAOCULAR LENS PLACEMENT ([place]) RIGHT;  Surgeon: Eulogio Bear, MD;  Location: [place];  Service: Ophthalmology;  Laterality: Right;  . COLONOSCOPY  2003  . PORT A CATH REVISION    . WRIST SURGERY  2005    Home Medications:  Allergies as of 08/25/2019      Reactions   Shrimp [shellfish Allergy] Hives   Augmentin [amoxicillin-pot Clavulanate] Nausea Only      Medication List       Accurate as of August 25, 2019 10:08 AM. If you have any questions, ask your nurse or doctor.        acetaminophen 650 MG CR tablet Commonly known as: TYLENOL Take 1,300 mg by mouth every 8 (eight) hours as needed for pain.   amLODipine-valsartan  5-160 MG tablet Commonly known as: Exforge Take 1 tablet by mouth daily.   anastrozole 1 MG tablet Commonly known as: ARIMIDEX TAKE 1 TABLET BY MOUTH ONCE DAILY   aspirin 81 MG tablet Take 81 mg by mouth daily.   CALCIUM-MAGNESIUM PO Take 1 tablet by mouth daily.   cetirizine 10 MG tablet Commonly known as: ZYRTEC Take 10 mg by mouth daily.   denosumab 60 MG/ML Sosy injection Commonly known as: PROLIA Inject 60 mg into the skin every 6 (six) months.   fluconazole  200 MG tablet Commonly known as: DIFLUCAN Take 200 mg by mouth once a week.   meloxicam 15 MG tablet Commonly known as: MOBIC Take 1 tablet (15 mg total) by mouth daily.   metoprolol succinate 50 MG 24 hr tablet Commonly known as: TOPROL-XL Take 1 tablet (50 mg total) by mouth daily. Take with or immediately following a meal.   multivitamin tablet Take 1 tablet by mouth daily.   nystatin powder Commonly known as: MYCOSTATIN/NYSTOP Apply topically 2 (two) times daily.   oxybutynin 10 MG 24 hr tablet Commonly known as: DITROPAN-XL Take 1 tablet (10 mg total) by mouth at bedtime.   pantoprazole 40 MG tablet Commonly known as: PROTONIX Take 1 tablet (40 mg total) by mouth daily.   tolterodine 4 MG 24 hr capsule Commonly known as: DETROL LA Take 4 mg by mouth daily.   VITAMIN D-3 PO Take 1 tablet by mouth daily.       Allergies:  Allergies  Allergen Reactions  . Shrimp [Shellfish Allergy] Hives  . Augmentin [Amoxicillin-Pot Clavulanate] Nausea Only    Family History: Family History  Problem Relation Age of Onset  . Cancer Father   . Bone cancer Father   . Breast cancer Mother 47  . Stroke Mother   . Colon cancer Maternal Grandmother   . Breast cancer Cousin     Social History:  reports that she has never smoked. She has never used smokeless tobacco. She reports current alcohol use of about 2.0 standard drinks of alcohol per week. She reports that she does not use drugs.  ROS: UROLOGY Frequent Urination?: No Hard to postpone urination?: No Burning/pain with urination?: No Get up at night to urinate?: Yes Leakage of urine?: No Urine stream starts and stops?: No Trouble starting stream?: No Do you have to strain to urinate?: No Blood in urine?: No Urinary tract infection?: No Sexually transmitted disease?: No Injury to kidneys or bladder?: No Painful intercourse?: No Weak stream?: No Currently pregnant?: No Vaginal bleeding?: No Last menstrual  period?: N  Gastrointestinal Nausea?: No Vomiting?: No Indigestion/heartburn?: No Diarrhea?: No Constipation?: No  Constitutional Fever: No Night sweats?: No Weight loss?: No Fatigue?: No  Skin Skin rash/lesions?: No Itching?: No  Eyes Blurred vision?: No Double vision?: No  Ears/Nose/Throat Sore throat?: No Sinus problems?: No  Hematologic/Lymphatic Swollen glands?: No Easy bruising?: No  Cardiovascular Leg swelling?: No Chest pain?: No  Respiratory Cough?: No Shortness of breath?: No  Endocrine Excessive thirst?: No  Musculoskeletal Back pain?: No Joint pain?: No  Neurological Headaches?: No Dizziness?: No  Psychologic Depression?: No Anxiety?: No  Physical Exam: BP (!) 151/85   Pulse 88   Ht 5\' 4"  (1.626 m)   Wt 217 lb (98.4 kg)   BMI 37.25 kg/m   Constitutional:  Alert and oriented, No acute distress.   Laboratory Data: Lab Results  Component Value Date   WBC 9.3 02/25/2019   HGB 12.7 02/25/2019  HCT 37.7 02/25/2019   MCV 92.4 02/25/2019   PLT 219 02/25/2019    Lab Results  Component Value Date   CREATININE 0.65 08/11/2019    No results found for: PSA  No results found for: TESTOSTERONE  Lab Results  Component Value Date   HGBA1C 5.6 08/11/2019    Urinalysis    Component Value Date/Time   COLORURINE YELLOW (A) 12/04/2018 0906   APPEARANCEUR Cloudy (A) 05/05/2019 1515   LABSPEC 1.009 12/04/2018 0906   LABSPEC 1.026 03/19/2012 2152   PHURINE 6.0 12/04/2018 0906   GLUCOSEU Negative 05/05/2019 1515   GLUCOSEU Negative 03/19/2012 2152   HGBUR NEGATIVE 12/04/2018 0906   BILIRUBINUR Negative 05/05/2019 1515   BILIRUBINUR Negative 03/19/2012 2152   KETONESUR NEGATIVE 12/04/2018 0906   PROTEINUR 1+ (A) 05/05/2019 1515   PROTEINUR NEGATIVE 12/04/2018 0906   NITRITE Negative 05/05/2019 1515   NITRITE NEGATIVE 12/04/2018 0906   LEUKOCYTESUR 1+ (A) 05/05/2019 1515   LEUKOCYTESUR Trace 03/19/2012 2152    Pertinent  Imaging:   Assessment & Plan: Reassess durability in 4 months  There are no diagnoses linked to this encounter.  No follow-ups on file.  [name], MD  [facility] [address], [place], [place] [address] [phone]

## 2019-09-09 DIAGNOSIS — M1711 Unilateral primary osteoarthritis, right knee: Secondary | ICD-10-CM | POA: Diagnosis not present

## 2019-09-12 ENCOUNTER — Ambulatory Visit: Payer: Medicare Other

## 2019-09-12 NOTE — Chronic Care Management (AMB) (Signed)
  Chronic Care Management   Note  09/12/2019 Name: Mary Todd MRN: [id number] DOB: [birth year]     Brief outreach with Ms. Lattin to discuss the Chronic Case Management program. The initial intake assessment was not completed as scheduled today. However, she is interested in the program and agreeable to outreach after her scheduled knee replacement on 10/03/19.    Follow up plan: -The care management team will reach out again on 10/08/19. -Ms. Montoya was provided with contact information for the care team and encouraged to call with any health related questions or concerns.     [facility] Care Management [phone]

## 2019-09-14 ENCOUNTER — Other Ambulatory Visit: Payer: Self-pay | Admitting: Hematology and Oncology

## 2019-09-14 DIAGNOSIS — Z853 Personal history of malignant neoplasm of breast: Secondary | ICD-10-CM

## 2019-09-15 NOTE — Telephone Encounter (Signed)
Patient has no follow up appointment scheduled. Last seen 02/2019  Follow-up and Disposition History  03/05/2019 1322 - Lequita Asal, MD  Check-out note:  Reschedule bone density study.  RTC in 6 months for MD assess, labs (CBC with diff, CMP, CA27.29) and Prolia.  -[facility]

## 2019-09-17 ENCOUNTER — Encounter: Payer: Self-pay | Admitting: Family Medicine

## 2019-09-17 ENCOUNTER — Ambulatory Visit (INDEPENDENT_AMBULATORY_CARE_PROVIDER_SITE_OTHER): Payer: Medicare Other | Admitting: Family Medicine

## 2019-09-17 ENCOUNTER — Telehealth: Payer: Self-pay

## 2019-09-17 ENCOUNTER — Other Ambulatory Visit: Payer: Self-pay

## 2019-09-17 ENCOUNTER — Other Ambulatory Visit: Payer: Self-pay | Admitting: Family Medicine

## 2019-09-17 VITALS — BP 124/76 | HR 99 | Temp 97.7°F | Resp 16 | Ht 67.0 in | Wt 220.1 lb

## 2019-09-17 DIAGNOSIS — G8929 Other chronic pain: Secondary | ICD-10-CM

## 2019-09-17 DIAGNOSIS — M25561 Pain in right knee: Secondary | ICD-10-CM

## 2019-09-17 DIAGNOSIS — M170 Bilateral primary osteoarthritis of knee: Secondary | ICD-10-CM | POA: Diagnosis not present

## 2019-09-17 DIAGNOSIS — I10 Essential (primary) hypertension: Secondary | ICD-10-CM

## 2019-09-17 DIAGNOSIS — Z01818 Encounter for other preprocedural examination: Secondary | ICD-10-CM

## 2019-09-17 LAB — CBC
HCT: 36.1 % (ref 35.0–45.0)
Hemoglobin: 12 g/dL (ref 11.7–15.5)
MCH: 30.5 pg (ref 27.0–33.0)
MCHC: 33.2 g/dL (ref 32.0–36.0)
MCV: 91.6 fL (ref 80.0–100.0)
MPV: 11 fL (ref 7.5–12.5)
Platelets: 222 10*3/uL (ref 140–400)
RBC: 3.94 10*6/uL (ref 3.80–5.10)
RDW: 12.8 % (ref 11.0–15.0)
WBC: 7.5 10*3/uL (ref 3.8–10.8)

## 2019-09-17 MED ORDER — AMLODIPINE BESYLATE-VALSARTAN 5-160 MG PO TABS: 1.0000 | ORAL_TABLET | Freq: Every day | ORAL | 0 refills | Status: DC

## 2019-09-17 NOTE — Telephone Encounter (Signed)
Refilled Arimidex 1 mg 1 tab po daily # 90 with no refills.The patient has been made aware.

## 2019-09-17 NOTE — Progress Notes (Signed)
Name: Mary Todd   MRN: [id number]    DOB: October 23, 1946   Date:09/17/2019       Progress Note  Subjective  Chief Complaint  Chief Complaint  Patient presents with  . Surgical Clearance    [facility] is doing her right knee surgery October 03, 2019 and need clearance f  . Knee Pain    HPI  Right knee OA: she has a long history of pain on right knee going on for years, she tired steroid injection and Synvisc but no longer helping and is now scheduled for a Total Knee Replacement surgery by Dr. Harlow Mares on Dec 4th, 2020. She states the pain is affecting her qualify of life, making it difficulty to get up from chair, having instability, has difficulty going up and down stairs. Also makes her slow down when cleaning and cooking. She is afraid she will fall when on uneven pavement. She had two previous surgeries and no complications from anesthesia. She does not have dentures or partials, but has implants on right upper side. Labs done in 07/2019 were normal , last CBC was 01/2019 and normal.  She denies chest pain, palpitation or decrease in exercise tolerance She had a syncopal episode back in 11/2018 likely from dehydration, had a normal EKG at [facility]   Patient Active Problem List   Diagnosis Date Noted  . Encounter for screening mammogram for breast cancer 07/02/2018  . Anterolisthesis 08/06/2017  . DDD (degenerative disc disease), lumbosacral 08/06/2017  . Chondromalacia patellae 07/03/2017  . Osteoporosis 12/14/2016  . Morbid obesity ([place]) 07/17/2016  . Spinal stenosis in cervical region 05/22/2016  . Thyroid nodule 05/22/2016  . GERD without esophagitis 03/16/2016  . Neck muscle spasm 03/16/2016  . History of back surgery 03/16/2016  . Osteoarthritis of both knees 03/16/2016  . Large breasts 03/16/2016  . Hypertension, benign 03/16/2016  . Perennial allergic rhinitis with seasonal variation 03/16/2016  . History of pneumococcal pneumonia 09/20/2015  . Hodgkin disease ([place])  07/15/2014  . Breast cancer, right ([place]) 06/26/2013    Past Surgical History:  Procedure Laterality Date  . ABDOMINAL HYSTERECTOMY    . BACK SURGERY    . BREAST BIOPSY Right 2011   radiation  . BREAST LUMPECTOMY Right 2011   w/ radiation  . BREAST SURGERY Right 2011   right breast lumpectomy,L/SN/R for right breast CA   . CATARACT EXTRACTION W/PHACO Left 06/10/2018   Procedure: CATARACT EXTRACTION PHACO AND INTRAOCULAR LENS PLACEMENT ([place]) LEFT;  Surgeon: Eulogio Bear, MD;  Location: [facility];  Service: Ophthalmology;  Laterality: Left;  Diabetic - oral meds  . CATARACT EXTRACTION W/PHACO Right 07/15/2018   Procedure: CATARACT EXTRACTION PHACO AND INTRAOCULAR LENS PLACEMENT ([place]) RIGHT;  Surgeon: Eulogio Bear, MD;  Location: [facility];  Service: Ophthalmology;  Laterality: Right;  . COLONOSCOPY  2003  . PORT A CATH REVISION    . WRIST SURGERY  2005    Family History  Problem Relation Age of Onset  . Cancer Father   . Bone cancer Father   . Breast cancer Mother 41  . Stroke Mother   . Colon cancer Maternal Grandmother   . Breast cancer Cousin     Social History   Socioeconomic History  . Marital status: Married    Spouse name: [name]  . Number of children: 2  . Years of education: some college  . Highest education level: 12th grade  Occupational History  . Occupation: Retired  Scientific laboratory technician  .  Financial resource strain: Not hard at all  . Food insecurity    Worry: Never true    Inability: Never true  . Transportation needs    Medical: No    Non-medical: No  Tobacco Use  . Smoking status: Never Smoker  . Smokeless tobacco: Never Used  . Tobacco comment: smoking cessation materials not required  Substance and Sexual Activity  . Alcohol use: Yes    Alcohol/week: 2.0 standard drinks    Types: 2 Glasses of wine per week    Comment: occassionally  . Drug use: No  . Sexual activity: Not Currently  Lifestyle  . Physical activity     Days per week: 0 days    Minutes per session: 0 min  . Stress: Not at all  Relationships  . Social connections    Talks on phone: More than three times a week    Gets together: More than three times a week    Attends religious service: More than 4 times per year    Active member of club or organization: No    Attends meetings of clubs or organizations: Never    Relationship status: Married  . Intimate partner violence    Fear of current or ex partner: No    Emotionally abused: No    Physically abused: No    Forced sexual activity: No  Other Topics Concern  . Not on file  Social History Narrative  . Not on file     Current Outpatient Medications:  .  acetaminophen (TYLENOL) 650 MG CR tablet, Take 1,300 mg by mouth every 8 (eight) hours as needed for pain., Disp: , Rfl:  .  amLODipine-valsartan (EXFORGE) 5-160 MG tablet, Take 1 tablet by mouth daily., Disp: 90 tablet, Rfl: 0 .  anastrozole (ARIMIDEX) 1 MG tablet, Take 1 tablet by mouth once daily, Disp: 90 tablet, Rfl: 0 .  aspirin 81 MG tablet, Take 81 mg by mouth daily., Disp: , Rfl:  .  CALCIUM-MAGNESIUM PO, Take 1 tablet by mouth daily., Disp: , Rfl:  .  cetirizine (ZYRTEC) 10 MG tablet, Take 10 mg by mouth daily., Disp: , Rfl:  .  Cholecalciferol (VITAMIN D-3 PO), Take 1 tablet by mouth daily., Disp: , Rfl:  .  denosumab (PROLIA) 60 MG/ML SOSY injection, Inject 60 mg into the skin every 6 (six) months., Disp: , Rfl:  .  fluconazole (DIFLUCAN) 200 MG tablet, Take 200 mg by mouth once a week., Disp: , Rfl:  .  meloxicam (MOBIC) 15 MG tablet, Take 1 tablet (15 mg total) by mouth daily., Disp: 90 tablet, Rfl: 1 .  metoprolol succinate (TOPROL-XL) 50 MG 24 hr tablet, Take 1 tablet (50 mg total) by mouth daily. Take with or immediately following a meal., Disp: 90 tablet, Rfl: 1 .  Multiple Vitamin (MULTIVITAMIN) tablet, Take 1 tablet by mouth daily., Disp: , Rfl:  .  nystatin (MYCOSTATIN/NYSTOP) powder, Apply topically 2 (two) times  daily., Disp: , Rfl:  .  pantoprazole (PROTONIX) 40 MG tablet, Take 1 tablet (40 mg total) by mouth daily., Disp: 90 tablet, Rfl: 1 .  tolterodine (DETROL LA) 4 MG 24 hr capsule, Take 4 mg by mouth daily., Disp: , Rfl:  .  oxybutynin (DITROPAN-XL) 10 MG 24 hr tablet, Take 1 tablet (10 mg total) by mouth at bedtime. (Patient not taking: Reported on 09/17/2019), Disp: 30 tablet, Rfl: 11  Allergies  Allergen Reactions  . Shrimp [Shellfish Allergy] Hives  . Augmentin [Amoxicillin-Pot Clavulanate] Nausea Only  I personally reviewed active problem list, medication list, allergies, family history, social history, health maintenance with the patient/caregiver today.   ROS  Constitutional: Negative for fever or weight change.  Respiratory: Negative for cough and shortness of breath.   Cardiovascular: Negative for chest pain or palpitations.  Gastrointestinal: Negative for abdominal pain, no bowel changes.  Musculoskeletal: Negative for gait problem or joint swelling.  Skin: Negative for rash.  Neurological: Negative for dizziness or headache.  No other specific complaints in a complete review of systems (except as listed in HPI above).  Objective  Vitals:   09/17/19 1101  BP: 124/76  Pulse: 99  Resp: 16  Temp: 97.7 F (36.5 C)  TempSrc: Temporal  SpO2: 98%  Weight: 220 lb 1.6 oz (99.8 kg)  Height: 5\' 7"  (1.702 m)    Body mass index is 34.47 kg/m.  Physical Exam  Constitutional: Patient appears well-developed and well-nourished. Obese No distress.  HEENT: head atraumatic, normocephalic, pupils equal and reactive to light Cardiovascular: Normal rate, regular rhythm and normal heart sounds.  No murmur heard. No BLE edema. Pulmonary/Chest: Effort normal and breath sounds normal. No respiratory distress. Abdominal: Soft.  There is no tenderness. Muscular skeletal: crepitus extension of both knees Psychiatric: Patient has a normal mood and affect. behavior is normal. Judgment  and thought content normal.  Recent Results (from the past 2160 hour(s))  COMPLETE METABOLIC PANEL WITH GFR     Status: None   Collection Time: 08/11/19 12:00 AM  Result Value Ref Range   Glucose, Bld 85 65 - 99 mg/dL    Comment: .            Fasting reference interval .    BUN 19 7 - 25 mg/dL   Creat 0.65 0.60 - 0.93 mg/dL    Comment: For patients >41 years of age, the reference limit for Creatinine is approximately 13% higher for people identified as African-American. .    GFR, Est Non African American 89 > OR = 60 mL/min/1.18m2   GFR, Est African American 103 > OR = 60 mL/min/1.75m2   BUN/Creatinine Ratio NOT APPLICABLE 6 - 22 (calc)   Sodium 138 135 - 146 mmol/L   Potassium 4.1 3.5 - 5.3 mmol/L   Chloride 103 98 - 110 mmol/L   CO2 29 20 - 32 mmol/L   Calcium 9.6 8.6 - 10.4 mg/dL   Total Protein 7.0 6.1 - 8.1 g/dL   Albumin 4.1 3.6 - 5.1 g/dL   Globulin 2.9 1.9 - 3.7 g/dL (calc)   AG Ratio 1.4 1.0 - 2.5 (calc)   Total Bilirubin 0.6 0.2 - 1.2 mg/dL   Alkaline phosphatase (APISO) 74 37 - 153 U/L   AST 20 10 - 35 U/L   ALT 24 6 - 29 U/L  Lipid panel     Status: Abnormal   Collection Time: 08/11/19 12:00 AM  Result Value Ref Range   Cholesterol 195 <200 mg/dL   HDL 76 > OR = 50 mg/dL   Triglycerides 93 <150 mg/dL   LDL Cholesterol (Calc) 100 (H) mg/dL (calc)    Comment: Reference range: <100 . Desirable range <100 mg/dL for primary prevention;   <70 mg/dL for patients with CHD or diabetic patients  with > or = 2 CHD risk factors. Marland Kitchen LDL-C is now calculated using the Martin-Hopkins  calculation, which is a validated novel method providing  better accuracy than the Friedewald equation in the  estimation of LDL-C.  Cresenciano Genre et al. Annamaria Helling. [id number]):  2061-2068  (http://education.[facility].com/faq/FAQ164)    Total CHOL/HDL Ratio 2.6 <5.0 (calc)   Non-HDL Cholesterol (Calc) 119 <130 mg/dL (calc)    Comment: For patients with diabetes plus 1 major ASCVD risk   factor, treating to a non-HDL-C goal of <100 mg/dL  (LDL-C of <70 mg/dL) is considered a therapeutic  option.   Hemoglobin A1c     Status: None   Collection Time: 08/11/19 12:00 AM  Result Value Ref Range   Hgb A1c MFr Bld 5.6 <5.7 % of total Hgb    Comment: For the purpose of screening for the presence of diabetes: . <5.7%       Consistent with the absence of diabetes 5.7-6.4%    Consistent with increased risk for diabetes             (prediabetes) > or =6.5%  Consistent with diabetes . This assay result is consistent with a decreased risk of diabetes. . Currently, no consensus exists regarding use of hemoglobin A1c for diagnosis of diabetes in children. . According to American Diabetes Association (ADA) guidelines, hemoglobin A1c <7.0% represents optimal control in non-pregnant diabetic patients. Different metrics may apply to specific patient populations.  Standards of Medical Care in Diabetes(ADA). .    Mean Plasma Glucose 114 (calc)   eAG (mmol/L) 6.3 (calc)  TSH     Status: None   Collection Time: 08/11/19 12:00 AM  Result Value Ref Range   TSH 0.60 0.40 - 4.50 mIU/L     PHQ2/9: Depression screen [facility] 2/9 09/17/2019 07/28/2019 03/25/2019 12/27/2018 12/24/2018  Decreased Interest 0 0 0 0 0  Down, Depressed, Hopeless 0 0 1 0 0  PHQ - 2 Score 0 0 1 0 0  Altered sleeping 0 0 0 - -  Tired, decreased energy 0 0 0 - -  Change in appetite 0 0 0 - -  Feeling bad or failure about yourself  0 0 0 - -  Trouble concentrating 0 0 0 - -  Moving slowly or fidgety/restless 0 0 0 - -  Suicidal thoughts 0 0 0 - -  PHQ-9 Score 0 0 1 - -  Difficult doing work/chores Not difficult at all - Not difficult at all - -  Some recent data might be hidden    phq 9 is negative   Fall Risk: Fall Risk  09/17/2019 07/28/2019 07/22/2019 03/25/2019 12/27/2018  Falls in the past year? 0 0 0 0 0  Number falls in past yr: 0 0 - 0 0  Injury with Fall? 0 0 - 0 0  Follow up - - - - Falls  prevention discussed     Functional Status Survey: Is the patient deaf or have difficulty hearing?: No Does the patient have difficulty seeing, even when wearing glasses/contacts?: Yes Does the patient have difficulty concentrating, remembering, or making decisions?: No Does the patient have difficulty walking or climbing stairs?: No Does the patient have difficulty dressing or bathing?: No Does the patient have difficulty doing errands alone such as visiting a doctor's office or shopping?: No   Assessment & Plan  1. Essential hypertension  - amLODipine-valsartan (EXFORGE) 5-160 MG tablet; Take 1 tablet by mouth daily.  Dispense: 90 tablet; Refill: 0 - CBC  2. Primary osteoarthritis of both knees   3. Chronic pain of right knee  - CBC  4. Pre-op exam  - CBC Low risk of complications based on risk calculator ACS NSQIP May proceed to surgery

## 2019-09-30 DIAGNOSIS — M25561 Pain in right knee: Secondary | ICD-10-CM | POA: Diagnosis not present

## 2019-09-30 DIAGNOSIS — M25661 Stiffness of right knee, not elsewhere classified: Secondary | ICD-10-CM | POA: Diagnosis not present

## 2019-10-03 DIAGNOSIS — R55 Syncope and collapse: Secondary | ICD-10-CM | POA: Diagnosis not present

## 2019-10-03 DIAGNOSIS — Z880 Allergy status to penicillin: Secondary | ICD-10-CM | POA: Diagnosis not present

## 2019-10-03 DIAGNOSIS — G8918 Other acute postprocedural pain: Secondary | ICD-10-CM | POA: Diagnosis not present

## 2019-10-03 DIAGNOSIS — Z96651 Presence of right artificial knee joint: Secondary | ICD-10-CM | POA: Insufficient documentation

## 2019-10-03 DIAGNOSIS — R110 Nausea: Secondary | ICD-10-CM | POA: Diagnosis not present

## 2019-10-03 DIAGNOSIS — N3281 Overactive bladder: Secondary | ICD-10-CM | POA: Diagnosis present

## 2019-10-03 DIAGNOSIS — M25561 Pain in right knee: Secondary | ICD-10-CM | POA: Diagnosis not present

## 2019-10-03 DIAGNOSIS — K219 Gastro-esophageal reflux disease without esophagitis: Secondary | ICD-10-CM | POA: Diagnosis present

## 2019-10-03 DIAGNOSIS — Z883 Allergy status to other anti-infective agents status: Secondary | ICD-10-CM | POA: Diagnosis not present

## 2019-10-03 DIAGNOSIS — Z91013 Allergy to seafood: Secondary | ICD-10-CM | POA: Diagnosis not present

## 2019-10-03 DIAGNOSIS — Z9849 Cataract extraction status, unspecified eye: Secondary | ICD-10-CM | POA: Diagnosis not present

## 2019-10-03 DIAGNOSIS — I10 Essential (primary) hypertension: Secondary | ICD-10-CM | POA: Diagnosis present

## 2019-10-03 DIAGNOSIS — Z7984 Long term (current) use of oral hypoglycemic drugs: Secondary | ICD-10-CM | POA: Diagnosis not present

## 2019-10-03 DIAGNOSIS — Z853 Personal history of malignant neoplasm of breast: Secondary | ICD-10-CM | POA: Diagnosis not present

## 2019-10-03 DIAGNOSIS — M1711 Unilateral primary osteoarthritis, right knee: Secondary | ICD-10-CM | POA: Diagnosis present

## 2019-10-03 HISTORY — PX: REPLACEMENT TOTAL KNEE: SUR1224

## 2019-10-06 ENCOUNTER — Other Ambulatory Visit (HOSPITAL_COMMUNITY): Payer: Self-pay | Admitting: Orthopedic Surgery

## 2019-10-06 ENCOUNTER — Other Ambulatory Visit: Payer: Self-pay | Admitting: Orthopedic Surgery

## 2019-10-06 ENCOUNTER — Ambulatory Visit
Admission: RE | Admit: 2019-10-06 | Discharge: 2019-10-06 | Disposition: A | Discharge status: Home / Self Care | Payer: Medicare Other | Source: Ambulatory Visit | Attending: Orthopedic Surgery | Admitting: Orthopedic Surgery

## 2019-10-06 ENCOUNTER — Other Ambulatory Visit: Payer: Self-pay

## 2019-10-06 DIAGNOSIS — R2241 Localized swelling, mass and lump, right lower limb: Secondary | ICD-10-CM

## 2019-10-06 DIAGNOSIS — Z96651 Presence of right artificial knee joint: Secondary | ICD-10-CM | POA: Insufficient documentation

## 2019-10-06 DIAGNOSIS — M7989 Other specified soft tissue disorders: Secondary | ICD-10-CM | POA: Diagnosis not present

## 2019-10-06 DIAGNOSIS — M79661 Pain in right lower leg: Secondary | ICD-10-CM | POA: Diagnosis not present

## 2019-10-08 ENCOUNTER — Telehealth: Payer: Self-pay

## 2019-10-10 DIAGNOSIS — M25661 Stiffness of right knee, not elsewhere classified: Secondary | ICD-10-CM | POA: Diagnosis not present

## 2019-10-10 DIAGNOSIS — M25561 Pain in right knee: Secondary | ICD-10-CM | POA: Diagnosis not present

## 2019-10-14 DIAGNOSIS — M25661 Stiffness of right knee, not elsewhere classified: Secondary | ICD-10-CM | POA: Diagnosis not present

## 2019-10-14 DIAGNOSIS — M25561 Pain in right knee: Secondary | ICD-10-CM | POA: Diagnosis not present

## 2019-10-15 ENCOUNTER — Ambulatory Visit (INDEPENDENT_AMBULATORY_CARE_PROVIDER_SITE_OTHER): Payer: Medicare Other

## 2019-10-15 DIAGNOSIS — I10 Essential (primary) hypertension: Secondary | ICD-10-CM

## 2019-10-15 DIAGNOSIS — M170 Bilateral primary osteoarthritis of knee: Secondary | ICD-10-CM | POA: Diagnosis not present

## 2019-10-15 NOTE — Patient Instructions (Addendum)
Thank You for allowing the Chronic Care Management team to participate in your care.  Goals Addressed            This Visit's Progress   . Improve Ability to Self-Manage Chronic Illnesses       Current Barriers:  . Chronic Disease Management support related to Hypertension and Osteoarthritis. Required right knee replacement on 10/03/19.  Case Manager Clinical Goal(s):  Marland Kitchen Over the next 90 days, patient will not require hospital admission related to chronic illnesses or surgical complications. . Over the next 90 days, patient will continue to take all medications as prescribed. . Over the next 90 days, patient will attend all scheduled provider appointments and surgical follow-up visits. . Over the next 90 days, patient will complete physical therapy sessions as recommended. . Over the next 90 days, patient will monitor her blood pressure and record readings. . Over the next 90 days, patient will follow recommended safety and fall prevention measures.  Interventions:  . Evaluation of current treatment plan related to hypertension self management and patient's adherence to plan as established by provider. . Reviewed medications and discussed importance of compliance. Encouraged to take all medications as prescribed. . Discussed blood pressure parameters and indications for notifying provider. Encouraged to monitor blood pressure daily and record readings. . Provided education regarding home safety and fall prevention. Encouraged to attend therapy sessions as scheduled. Encouraged to use assistive device when ambulating to prevent falls.  . Discussed s/sx of infection and indications for seeking medical attention. Encouraged to monitor surgical incision site and report changes. Reports minimal swelling to right knee. Reports staples are intact. Denies drainage, warmth or significant discoloration to site. . Reviewed schedule and pending appointments. Encouraged to attend appointments as  scheduled to prevent delays in care. Denies concerns regarding transportation. Pending appointment with Ortho provider on 10/16/19. Marland Kitchen Discussed plans for ongoing care management follow up. Provided direct contact information for care management team.    Patient Self Care Activities:  . Self administers medications  . Attends scheduled provider appointments . Calls pharmacy for medication refills . Performs ADL's independently  Initial goal documentation        Ms. Orlowski was given information about Chronic Care Management services  including:  1. CCM service includes personalized support from designated clinical staff supervised by her physician, including individualized plan of care and coordination with other care providers 2. 24/7 contact phone numbers for assistance for urgent and routine care needs. 3. Service will only be billed when office clinical staff spend 20 minutes or more in a month to coordinate care. 4. Only one practitioner may furnish and bill the service in a calendar month. 5. The patient may stop CCM services at any time (effective at the end of the month) by phone call to the office staff. 6. The patient will be responsible for cost sharing (co-pay) of up to 20% of the service fee (after annual deductible is met).   Patient agreed to services. Verbal consent obtained.   Ms. Skolnick verbalized understanding of the instructions provided during the telephonic outreach today. She declined need for a printed/mailed copy of the instructions.    The care management team will reach out to Ms. Wann again next month.   [facility] Care Management [phone]

## 2019-10-15 NOTE — Chronic Care Management (AMB) (Signed)
Chronic Care Management   Initial Visit Note  10/15/2019 Name: Mary Todd MRN: [id number] DOB: 03-22-1946  Primary Care Provider: Steele Sizer, MD Reason for referral : Chronic Disease Management  Mary Todd is a 73 y.o. year old female who is a primary care patient of Steele Sizer, MD. The CCM team was consulted for assistance with chronic disease management and care coordination. A total right knee replacement was performed on 10/03/19.   The primary focus of our conversation on today was hypertension management and care coordination related to her recent right knee surgery.  Review of Mary Todd's status, including review of consultants reports, relevant labs and test results was conducted today. Collaboration with appropriate care team members was performed as part of the comprehensive evaluation and provision of chronic care management services.     SDOH (Social Determinants of Health) screening was  performed today.   Medications: Outpatient Encounter Medications as of 10/15/2019  Medication Sig  . acetaminophen (TYLENOL) 650 MG CR tablet Take 1,300 mg by mouth every 8 (eight) hours as needed for pain.  Marland Kitchen amLODipine-valsartan (EXFORGE) 5-160 MG tablet Take 1 tablet by mouth daily.  Marland Kitchen anastrozole (ARIMIDEX) 1 MG tablet Take 1 tablet by mouth once daily  . aspirin 81 MG tablet Take 81 mg by mouth daily.  Marland Kitchen CALCIUM-MAGNESIUM PO Take 1 tablet by mouth daily.  . cetirizine (ZYRTEC) 10 MG tablet Take 10 mg by mouth daily.  . Cholecalciferol (VITAMIN D-3 PO) Take 1 tablet by mouth daily.  Marland Kitchen denosumab (PROLIA) 60 MG/ML SOSY injection Inject 60 mg into the skin every 6 (six) months.  . meloxicam (MOBIC) 15 MG tablet Take 1 tablet (15 mg total) by mouth daily.  . metoprolol succinate (TOPROL-XL) 50 MG 24 hr tablet Take 1 tablet (50 mg total) by mouth daily. Take with or immediately following a meal.  . Multiple Vitamin (MULTIVITAMIN) tablet Take 1 tablet by  mouth daily.  Marland Kitchen nystatin (MYCOSTATIN/NYSTOP) powder Apply topically 2 (two) times daily.  . pantoprazole (PROTONIX) 40 MG tablet Take 1 tablet (40 mg total) by mouth daily.  Marland Kitchen tolterodine (DETROL LA) 4 MG 24 hr capsule Take 4 mg by mouth daily.   No facility-administered encounter medications on file as of 10/15/2019.       Objective:   BP Readings from Last 3 Encounters:  09/17/19 124/76  08/25/19 (!) 151/85  08/11/19 134/88     Fall Risk  10/15/2019 09/17/2019 07/28/2019 07/22/2019 03/25/2019  Falls in the past year? 0 0 0 0 0  Number falls in past yr: - 0 0 - 0  Injury with Fall? - 0 0 - 0  Risk for fall due to : Impaired balance/gait;Orthopedic patient - - - -  Follow up Falls prevention discussed - - - -    Functional Status Survey: Is the patient deaf or have difficulty hearing?: No Does the patient have difficulty seeing, even when wearing glasses/contacts?: No Does the patient have difficulty concentrating, remembering, or making decisions?: No Does the patient have difficulty walking or climbing stairs?: Yes(Due to recent knee surgery) Does the patient have difficulty dressing or bathing?: No Does the patient have difficulty doing errands alone such as visiting a doctor's office or shopping?: Yes(Due to recent knee surgery (Family members assisting))     Depression screen Mary Todd 2/9 10/15/2019 09/17/2019 07/28/2019  Decreased Interest 0 0 0  Down, Depressed, Hopeless 0 0 0  PHQ - 2 Score 0 0 0  Altered sleeping - 0  0  Tired, decreased energy - 0 0  Change in appetite - 0 0  Feeling bad or failure about yourself  - 0 0  Trouble concentrating - 0 0  Moving slowly or fidgety/restless - 0 0  Suicidal thoughts - 0 0  PHQ-9 Score - 0 0  Difficult doing work/chores - Not difficult at all -  Some recent data might be hidden    Goals Addressed            This Visit's Progress   . Improve Ability to Self-Manage Chronic Illnesses       Current Barriers:  . Chronic  Disease Management support related to Hypertension and Osteoarthritis. Required right knee replacement on 10/03/19.  Case Manager Clinical Goal(s):  Marland Kitchen Over the next 90 days, patient will not require hospital admission related to chronic illnesses or surgical complications. . Over the next 90 days, patient will continue to take all medications as prescribed. . Over the next 90 days, patient will attend all scheduled provider appointments and surgical follow-up visits. . Over the next 90 days, patient will complete physical therapy sessions as recommended. . Over the next 90 days, patient will monitor her blood pressure and record readings. . Over the next 90 days, patient will follow recommended safety and fall prevention measures.  Interventions:  . Evaluation of current treatment plan related to hypertension self management and patient's adherence to plan as established by provider. . Reviewed medications and discussed importance of compliance. Encouraged to take all medications as prescribed. . Discussed blood pressure parameters and indications for notifying provider. Encouraged to monitor blood pressure daily and record readings. . Provided education regarding home safety and fall prevention. Encouraged to attend therapy sessions as scheduled. Encouraged to use assistive device when ambulating to prevent falls.  . Discussed s/sx of infection and indications for seeking medical attention. Encouraged to monitor surgical incision site and report changes. Reports minimal swelling to right knee. Reports staples are intact. Denies drainage, warmth or significant discoloration to site. . Reviewed schedule and pending appointments. Encouraged to attend appointments as scheduled to prevent delays in care. Denies concerns regarding transportation. Pending appointment with Ortho provider on 10/16/19. Marland Kitchen Discussed plans for ongoing care management follow up. Provided direct contact information for care  management team.    Patient Self Care Activities:  . Self administers medications  . Attends scheduled provider appointments . Calls pharmacy for medication refills . Performs ADL's independently  Initial goal documentation        Ms. Southard was given information about Chronic Care Management services  including:  1. CCM service includes personalized support from designated clinical staff supervised by her physician, including individualized plan of care and coordination with other care providers 2. 24/7 contact phone numbers for assistance for urgent and routine care needs. 3. Service will only be billed when office clinical staff spend 20 minutes or more in a month to coordinate care. 4. Only one practitioner may furnish and bill the service in a calendar month. 5. The patient may stop CCM services at any time (effective at the end of the month) by phone call to the office staff. 6. The patient will be responsible for cost sharing (co-pay) of up to 20% of the service fee (after annual deductible is met).  Ms. Epping agreed to services and verbal consent was obtained on 08/22/19.   PLAN The care management team will follow-up with Ms. Errickson next month.   [facility] (  [phone]       

## 2019-11-03 ENCOUNTER — Ambulatory Visit (INDEPENDENT_AMBULATORY_CARE_PROVIDER_SITE_OTHER): Payer: Medicare Other | Admitting: Family Medicine

## 2019-11-03 ENCOUNTER — Other Ambulatory Visit: Payer: Self-pay

## 2019-11-03 ENCOUNTER — Encounter: Payer: Self-pay | Admitting: Family Medicine

## 2019-11-03 DIAGNOSIS — I10 Essential (primary) hypertension: Secondary | ICD-10-CM | POA: Diagnosis not present

## 2019-11-03 DIAGNOSIS — C8193 Hodgkin lymphoma, unspecified, intra-abdominal lymph nodes: Secondary | ICD-10-CM

## 2019-11-03 DIAGNOSIS — M170 Bilateral primary osteoarthritis of knee: Secondary | ICD-10-CM

## 2019-11-03 DIAGNOSIS — K219 Gastro-esophageal reflux disease without esophagitis: Secondary | ICD-10-CM | POA: Diagnosis not present

## 2019-11-03 MED ORDER — AMLODIPINE BESYLATE-VALSARTAN 5-160 MG PO TABS: 1.0000 | ORAL_TABLET | Freq: Every day | ORAL | 1 refills | Status: DC

## 2019-11-03 MED ORDER — METOPROLOL SUCCINATE ER 50 MG PO TB24: 50.0000 mg | ORAL_TABLET | Freq: Every day | ORAL | 1 refills | Status: DC

## 2019-11-03 MED ORDER — MELOXICAM 15 MG PO TABS: 15.0000 mg | ORAL_TABLET | Freq: Every day | ORAL | 1 refills | Status: DC

## 2019-11-03 MED ORDER — PANTOPRAZOLE SODIUM 40 MG PO TBEC: 40.0000 mg | DELAYED_RELEASE_TABLET | Freq: Every day | ORAL | 1 refills | Status: DC

## 2019-11-03 NOTE — Progress Notes (Signed)
Name: Mary Todd   MRN: [id number]    DOB: 11-Sep-1946   Date:11/03/2019       Progress Note  Subjective  Chief Complaint  Chief Complaint  Patient presents with  . Hypertension  . Osteoarthritis  . Prediabetes  . Gastroesophageal Reflux    HPI  Pre-diabetes: she denies polyphagia, polydipsia or polyuria, last A1C was normal and she has been off Metformin since recall in 2020   Urge incontinence:. She saw Urologist this past Summer and was given Myrbetriq , but it did not decrease nighttime symptoms, switched to Ditropan , now she is on Detrol. Taking 4 mg at night and nocturia is down to 2-3 times, seeing Urologist Scott MacDiarmid   HTN: she is doing well on Exforge 5/160 and also metoprolol, tolerating medication well, no chest pain or palpitation, bp is at goal   GERD:she tried stopping Pantoprazole but as soon as she went off medication symptoms became severe, with heartburn  and regurgitation, andhas been taking it daily again.Symptoms controlled with medication  OA: she had TKR right knee on Dec 4 th 2020 by Dr. Harlow Mares , having PT twice weekly , still has constant pain with some spikes throughout the day  RightBreast Cancer/hodgkins lymphoma : under the care of Dr. Mike Gip and now Dr. Hampton Abbot . Also diagnosed with hodgkin's lymphoma in 2004, breast cancer diagnosed in 2011still on Arimidex for 5 more years . Had to start Prolia for osteoporosis, found on bone density of spine, she is tolerating medications well, last mammogram was normal- Fall 2020 .Last Prolia infusion was 07/2019   Thyroid nodules: incidental finding on CT neck , had biopsy by Dr. Gabriel Carina Dec 2017 and biopsy was negative, still has yearly follow ups with her. She usually goes in July , unchanged   Obesity: she has long history of obesity, she isoff  Metformin,she has gained over 10 lbs in the past year. Discussed portion control, healthier food options at home, she will try to  increase physical activity   Patient Active Problem List   Diagnosis Date Noted  . Encounter for screening mammogram for breast cancer 07/02/2018  . Anterolisthesis 08/06/2017  . DDD (degenerative disc disease), lumbosacral 08/06/2017  . Chondromalacia patellae 07/03/2017  . Osteoporosis 12/14/2016  . Morbid obesity ([place]) 07/17/2016  . Spinal stenosis in cervical region 05/22/2016  . Thyroid nodule 05/22/2016  . GERD without esophagitis 03/16/2016  . Neck muscle spasm 03/16/2016  . History of back surgery 03/16/2016  . Osteoarthritis of both knees 03/16/2016  . Large breasts 03/16/2016  . Hypertension, benign 03/16/2016  . Perennial allergic rhinitis with seasonal variation 03/16/2016  . History of pneumococcal pneumonia 09/20/2015  . Hodgkin disease ([place]) 07/15/2014  . Breast cancer, right ([place]) 06/26/2013    Past Surgical History:  Procedure Laterality Date  . ABDOMINAL HYSTERECTOMY    . BACK SURGERY    . BREAST BIOPSY Right 2011   radiation  . BREAST LUMPECTOMY Right 2011   w/ radiation  . BREAST SURGERY Right 2011   right breast lumpectomy,L/SN/R for right breast CA   . CATARACT EXTRACTION W/PHACO Left 06/10/2018   Procedure: CATARACT EXTRACTION PHACO AND INTRAOCULAR LENS PLACEMENT ([place]) LEFT;  Surgeon: Eulogio Bear, MD;  Location: [facility];  Service: Ophthalmology;  Laterality: Left;  Diabetic - oral meds  . CATARACT EXTRACTION W/PHACO Right 07/15/2018   Procedure: CATARACT EXTRACTION PHACO AND INTRAOCULAR LENS PLACEMENT ([place]) RIGHT;  Surgeon: Eulogio Bear, MD;  Location: [facility];  Service: Ophthalmology;  Laterality: Right;  . COLONOSCOPY  2003  . PORT A CATH REVISION    . REPLACEMENT TOTAL KNEE Right 10/03/2019  . WRIST SURGERY  2005    Family History  Problem Relation Age of Onset  . Cancer Father   . Bone cancer Father   . Breast cancer Mother 71  . Stroke Mother   . Colon cancer Maternal Grandmother   . Breast cancer  Cousin     Social History   Socioeconomic History  . Marital status: Married    Spouse name: [name]  . Number of children: 2  . Years of education: some college  . Highest education level: 12th grade  Occupational History  . Occupation: Retired  Tobacco Use  . Smoking status: Never Smoker  . Smokeless tobacco: Never Used  . Tobacco comment: smoking cessation materials not required  Substance and Sexual Activity  . Alcohol use: Yes    Alcohol/week: 2.0 standard drinks    Types: 2 Glasses of wine per week    Comment: occassionally  . Drug use: No  . Sexual activity: Not Currently  Other Topics Concern  . Not on file  Social History Narrative  . Not on file   Social Determinants of Health   Financial Resource Strain:   . Difficulty of Paying Living Expenses: Not on file  Food Insecurity:   . Worried About Charity fundraiser in the Last Year: Not on file  . Ran Out of Food in the Last Year: Not on file  Transportation Needs:   . Lack of Transportation (Medical): Not on file  . Lack of Transportation (Non-Medical): Not on file  Physical Activity:   . Days of Exercise per Week: Not on file  . Minutes of Exercise per Session: Not on file  Stress:   . Feeling of Stress : Not on file  Social Connections: Unknown  . Frequency of Communication with Friends and Family: More than three times a week  . Frequency of Social Gatherings with Friends and Family: More than three times a week  . Attends Religious Services: More than 4 times per year  . Active Member of Clubs or Organizations: No  . Attends Archivist Meetings: Never  . Marital Status: Not on file  Intimate Partner Violence:   . Fear of Current or Ex-Partner: Not on file  . Emotionally Abused: Not on file  . Physically Abused: Not on file  . Sexually Abused: Not on file     Current Outpatient Medications:  .  methocarbamol (ROBAXIN) 500 MG tablet, Take 500 mg by mouth every 6 (six) hours., Disp: ,  Rfl:  .  oxyCODONE (OXY IR/ROXICODONE) 5 MG immediate release tablet, Take 5 mg by mouth every 4 (four) hours as needed., Disp: , Rfl:  .  SENNOSIDES-DOCUSATE SODIUM PO, 2 EACH PO QAM, Disp: , Rfl:  .  acetaminophen (TYLENOL) 650 MG CR tablet, Take 1,300 mg by mouth every 8 (eight) hours as needed for pain., Disp: , Rfl:  .  amLODipine-valsartan (EXFORGE) 5-160 MG tablet, Take 1 tablet by mouth daily., Disp: 90 tablet, Rfl: 0 .  anastrozole (ARIMIDEX) 1 MG tablet, Take 1 tablet by mouth once daily, Disp: 90 tablet, Rfl: 0 .  aspirin 81 MG tablet, Take 81 mg by mouth daily., Disp: , Rfl:  .  CALCIUM-MAGNESIUM PO, Take 1 tablet by mouth daily., Disp: , Rfl:  .  cetirizine (ZYRTEC) 10 MG tablet, Take 10 mg by mouth daily.,  Disp: , Rfl:  .  Cholecalciferol (VITAMIN D-3 PO), Take 1 tablet by mouth daily., Disp: , Rfl:  .  denosumab (PROLIA) 60 MG/ML SOSY injection, Inject 60 mg into the skin every 6 (six) months., Disp: , Rfl:  .  meloxicam (MOBIC) 15 MG tablet, Take 1 tablet (15 mg total) by mouth daily., Disp: 90 tablet, Rfl: 1 .  metoprolol succinate (TOPROL-XL) 50 MG 24 hr tablet, Take 1 tablet (50 mg total) by mouth daily. Take with or immediately following a meal., Disp: 90 tablet, Rfl: 1 .  Multiple Vitamin (MULTIVITAMIN) tablet, Take 1 tablet by mouth daily., Disp: , Rfl:  .  nystatin (MYCOSTATIN/NYSTOP) powder, Apply topically 2 (two) times daily., Disp: , Rfl:  .  pantoprazole (PROTONIX) 40 MG tablet, Take 1 tablet (40 mg total) by mouth daily., Disp: 90 tablet, Rfl: 1 .  tolterodine (DETROL LA) 4 MG 24 hr capsule, Take 4 mg by mouth daily., Disp: , Rfl:   Allergies  Allergen Reactions  . Shrimp [Shellfish Allergy] Hives  . Augmentin [Amoxicillin-Pot Clavulanate] Nausea Only    I personally reviewed active problem list, medication list, allergies, family history, social history, health maintenance with the patient/caregiver today.   ROS  Constitutional: Negative for fever or weight  change.  Respiratory: Negative for cough and shortness of breath.   Cardiovascular: Negative for chest pain or palpitations.  Gastrointestinal: Negative for abdominal pain, no bowel changes.  Musculoskeletal: Negative for gait problem or joint swelling.  Skin: Negative for rash.  Neurological: Negative for dizziness or headache.  No other specific complaints in a complete review of systems (except as listed in HPI above).  Objective  Vitals:   11/03/19 1119  BP: 130/80  Pulse: 91  Resp: 16  Temp: (!) 97.5 F (36.4 C)  TempSrc: Temporal  SpO2: 98%  Weight: 217 lb 3.2 oz (98.5 kg)  Height: 5\' 7"  (1.702 m)    Body mass index is 34.02 kg/m.  Physical Exam  Constitutional: Patient appears well-developed and well-nourished. Obese  No distress.  HEENT: head atraumatic, normocephalic, pupils equal and reactive to light Cardiovascular: Normal rate, regular rhythm and normal heart sounds.  No murmur heard. No BLE edema. Pulmonary/Chest: Effort normal and breath sounds normal. No respiratory distress. Abdominal: Soft.  There is no tenderness. Muscular skeletal: decrease rom of right knee  Psychiatric: Patient has a normal mood and affect. behavior is normal. Judgment and thought content normal.  Recent Results (from the past 2160 hour(s))  COMPLETE METABOLIC PANEL WITH GFR     Status: None   Collection Time: 08/11/19 12:00 AM  Result Value Ref Range   Glucose, Bld 85 65 - 99 mg/dL    Comment: .            Fasting reference interval .    BUN 19 7 - 25 mg/dL   Creat 0.65 0.60 - 0.93 mg/dL    Comment: For patients >32 years of age, the reference limit for Creatinine is approximately 13% higher for people identified as African-American. .    GFR, Est Non African American 89 > OR = 60 mL/min/1.87m2   GFR, Est African American 103 > OR = 60 mL/min/1.32m2   BUN/Creatinine Ratio NOT APPLICABLE 6 - 22 (calc)   Sodium 138 135 - 146 mmol/L   Potassium 4.1 3.5 - 5.3 mmol/L    Chloride 103 98 - 110 mmol/L   CO2 29 20 - 32 mmol/L   Calcium 9.6 8.6 - 10.4 mg/dL  Total Protein 7.0 6.1 - 8.1 g/dL   Albumin 4.1 3.6 - 5.1 g/dL   Globulin 2.9 1.9 - 3.7 g/dL (calc)   AG Ratio 1.4 1.0 - 2.5 (calc)   Total Bilirubin 0.6 0.2 - 1.2 mg/dL   Alkaline phosphatase (APISO) 74 37 - 153 U/L   AST 20 10 - 35 U/L   ALT 24 6 - 29 U/L  Lipid panel     Status: Abnormal   Collection Time: 08/11/19 12:00 AM  Result Value Ref Range   Cholesterol 195 <200 mg/dL   HDL 76 > OR = 50 mg/dL   Triglycerides 93 <150 mg/dL   LDL Cholesterol (Calc) 100 (H) mg/dL (calc)    Comment: Reference range: <100 . Desirable range <100 mg/dL for primary prevention;   <70 mg/dL for patients with CHD or diabetic patients  with > or = 2 CHD risk factors. Marland Kitchen LDL-C is now calculated using the Martin-Hopkins  calculation, which is a validated novel method providing  better accuracy than the Friedewald equation in the  estimation of LDL-C.  Cresenciano Genre et al. Annamaria Helling. [id number]): 2061-2068  (http://education.[facility].com/faq/FAQ164)    Total CHOL/HDL Ratio 2.6 <5.0 (calc)   Non-HDL Cholesterol (Calc) 119 <130 mg/dL (calc)    Comment: For patients with diabetes plus 1 major ASCVD risk  factor, treating to a non-HDL-C goal of <100 mg/dL  (LDL-C of <70 mg/dL) is considered a therapeutic  option.   Hemoglobin A1c     Status: None   Collection Time: 08/11/19 12:00 AM  Result Value Ref Range   Hgb A1c MFr Bld 5.6 <5.7 % of total Hgb    Comment: For the purpose of screening for the presence of diabetes: . <5.7%       Consistent with the absence of diabetes 5.7-6.4%    Consistent with increased risk for diabetes             (prediabetes) > or =6.5%  Consistent with diabetes . This assay result is consistent with a decreased risk of diabetes. . Currently, no consensus exists regarding use of hemoglobin A1c for diagnosis of diabetes in children. . According to American Diabetes Association  (ADA) guidelines, hemoglobin A1c <7.0% represents optimal control in non-pregnant diabetic patients. Different metrics may apply to specific patient populations.  Standards of Medical Care in Diabetes(ADA). .    Mean Plasma Glucose 114 (calc)   eAG (mmol/L) 6.3 (calc)  TSH     Status: None   Collection Time: 08/11/19 12:00 AM  Result Value Ref Range   TSH 0.60 0.40 - 4.50 mIU/L  CBC     Status: None   Collection Time: 09/17/19 12:00 AM  Result Value Ref Range   WBC 7.5 3.8 - 10.8 Thousand/uL   RBC 3.94 3.80 - 5.10 Million/uL   Hemoglobin 12.0 11.7 - 15.5 g/dL   HCT 36.1 35.0 - 45.0 %   MCV 91.6 80.0 - 100.0 fL   MCH 30.5 27.0 - 33.0 pg   MCHC 33.2 32.0 - 36.0 g/dL   RDW 12.8 11.0 - 15.0 %   Platelets 222 140 - 400 Thousand/uL   MPV 11.0 7.5 - 12.5 fL     PHQ2/9: Depression screen [facility] 2/9 11/03/2019 10/15/2019 09/17/2019 07/28/2019 03/25/2019  Decreased Interest 0 0 0 0 0  Down, Depressed, Hopeless 0 0 0 0 1  PHQ - 2 Score 0 0 0 0 1  Altered sleeping 0 - 0 0 0  Tired, decreased energy 0 - 0 0  0  Change in appetite 0 - 0 0 0  Feeling bad or failure about yourself  0 - 0 0 0  Trouble concentrating 0 - 0 0 0  Moving slowly or fidgety/restless 0 - 0 0 0  Suicidal thoughts 0 - 0 0 0  PHQ-9 Score 0 - 0 0 1  Difficult doing work/chores - - Not difficult at all - Not difficult at all  Some recent data might be hidden    phq 9 is negative   Fall Risk: Fall Risk  11/03/2019 10/15/2019 09/17/2019 07/28/2019 07/22/2019  Falls in the past year? 0 0 0 0 0  Number falls in past yr: 0 - 0 0 -  Injury with Fall? 0 - 0 0 -  Risk for fall due to : - Impaired balance/gait;Orthopedic patient - - -  Follow up - Falls prevention discussed - - -     Functional Status Survey: Is the patient deaf or have difficulty hearing?: No Does the patient have difficulty seeing, even when wearing glasses/contacts?: No Does the patient have difficulty concentrating, remembering, or making decisions?:  No Does the patient have difficulty walking or climbing stairs?: Yes Does the patient have difficulty dressing or bathing?: No Does the patient have difficulty doing errands alone such as visiting a doctor's office or shopping?: No    Assessment & Plan  1. GERD without esophagitis  - pantoprazole (PROTONIX) 40 MG tablet; Take 1 tablet (40 mg total) by mouth daily.  Dispense: 90 tablet; Refill: 1  2. Essential hypertension  - metoprolol succinate (TOPROL-XL) 50 MG 24 hr tablet; Take 1 tablet (50 mg total) by mouth daily. Take with or immediately following a meal.  Dispense: 90 tablet; Refill: 1 - amLODipine-valsartan (EXFORGE) 5-160 MG tablet; Take 1 tablet by mouth daily.  Dispense: 90 tablet; Refill: 1  3. Primary osteoarthritis of both knees  - meloxicam (MOBIC) 15 MG tablet; Take 1 tablet (15 mg total) by mouth daily.  Dispense: 90 tablet; Refill: 1  4. Hodgkin lymphoma of intra-abdominal lymph nodes, unspecified Hodgkin lymphoma type ([place])   5. Morbid obesity, unspecified obesity type [facility])  Discussed with the patient the risk posed by an increased BMI. Discussed importance of portion control, calorie counting and at least 150 minutes of physical activity weekly. Avoid sweet beverages and drink more water. Eat at least 6 servings of fruit and vegetables daily

## 2019-11-10 ENCOUNTER — Telehealth: Payer: Self-pay

## 2019-11-14 DIAGNOSIS — M25561 Pain in right knee: Secondary | ICD-10-CM | POA: Diagnosis not present

## 2019-11-14 DIAGNOSIS — M25661 Stiffness of right knee, not elsewhere classified: Secondary | ICD-10-CM | POA: Diagnosis not present

## 2019-11-18 DIAGNOSIS — M25561 Pain in right knee: Secondary | ICD-10-CM | POA: Diagnosis not present

## 2019-11-18 DIAGNOSIS — M25661 Stiffness of right knee, not elsewhere classified: Secondary | ICD-10-CM | POA: Diagnosis not present

## 2019-11-20 ENCOUNTER — Encounter: Payer: Self-pay | Admitting: Family Medicine

## 2019-11-20 DIAGNOSIS — M25661 Stiffness of right knee, not elsewhere classified: Secondary | ICD-10-CM | POA: Diagnosis not present

## 2019-11-20 DIAGNOSIS — M25561 Pain in right knee: Secondary | ICD-10-CM | POA: Diagnosis not present

## 2019-11-21 ENCOUNTER — Telehealth: Payer: Self-pay | Admitting: Family Medicine

## 2019-11-21 ENCOUNTER — Ambulatory Visit: Payer: Medicare Other

## 2019-11-21 ENCOUNTER — Other Ambulatory Visit: Payer: Self-pay

## 2019-11-21 NOTE — Chronic Care Management (AMB) (Signed)
  Chronic Care Management   Note  11/21/2019 Name: Mary Todd MRN: [id number] DOB: January 25, 1946     Attempted routine outreach with Mary Todd. She reports feeling well but requests to complete outreach at a later time. She will attempt to follow up around 4:30 today if time permits.   Follow up plan: Pending return call.   [facility] [phone]

## 2019-11-21 NOTE — Telephone Encounter (Signed)
Dr Ancil Boozer pt: Pt scheduled appt for Monday with Raquel Sarna for body aches/fatigue. She is wanting to know if you would like to order blood work. Stated that she had spoken with Dr Ancil Boozer at her last visit and she remember Dr Ancil Boozer telling her that her blood count could be low. Pleaee advise if she should come into the office and please return her call possibly today

## 2019-11-24 ENCOUNTER — Ambulatory Visit: Payer: Medicare Other | Admitting: Family Medicine

## 2019-11-24 NOTE — Telephone Encounter (Signed)
Patient was told on Friday she had to be seen first to have Blood work ordered

## 2019-11-24 NOTE — Telephone Encounter (Signed)
Patient appointment is Wednesday with Dr. Ancil Boozer her primary

## 2019-11-25 DIAGNOSIS — M25661 Stiffness of right knee, not elsewhere classified: Secondary | ICD-10-CM | POA: Diagnosis not present

## 2019-11-25 DIAGNOSIS — M25561 Pain in right knee: Secondary | ICD-10-CM | POA: Diagnosis not present

## 2019-11-26 ENCOUNTER — Encounter: Payer: Medicare Other | Admitting: Family Medicine

## 2019-11-27 DIAGNOSIS — M25561 Pain in right knee: Secondary | ICD-10-CM | POA: Diagnosis not present

## 2019-11-27 DIAGNOSIS — M25661 Stiffness of right knee, not elsewhere classified: Secondary | ICD-10-CM | POA: Diagnosis not present

## 2019-12-11 ENCOUNTER — Other Ambulatory Visit: Payer: Self-pay | Admitting: Hematology and Oncology

## 2019-12-11 DIAGNOSIS — Z853 Personal history of malignant neoplasm of breast: Secondary | ICD-10-CM

## 2019-12-18 ENCOUNTER — Inpatient Hospital Stay: Payer: Medicare Other

## 2019-12-18 ENCOUNTER — Inpatient Hospital Stay: Payer: Medicare Other | Admitting: Hematology and Oncology

## 2019-12-24 NOTE — Progress Notes (Signed)
[facility]  [address] [place], [place] [address] Phone: [phone]  Fax: [phone]   Clinic Day:  12/26/2019  Referring physician: Steele Sizer, MD  Chief Complaint: Mary Todd is a 74 y.o. female with stage II Hodgkin's disease (2004) and stage IA right breast cancer (2011) who is seen for a 9 month assessment.    HPI: The patient was last seen in the medical oncology clinic on 03/05/2019 via telemedicine. At that time, she was doing well. She denied any concerns. Labs were normal. CA27.29 was normal. She continued Arimidex, calcium and vitamin D.   Bone density study on 05/05/2019 revealed osteopenia with a T-score of -2.4 at the AP spine.   Bilateral mammogram on 07/09/2019 revealed no evidence of malignancy.   She was seen by Dr. Hampton Abbot on 07/22/2019. She was doing well. She denied any new masses, skin changes, or nipple changes. Patient will follow up as needed.   She underwent total knee replacement surgery on 10/03/2019 with Dr. Harlow Mares in [place], [place].  Patient developed RLE pain and swelling. Right unilateral lower extremity venous doppler ultrasound on 10/06/2019 was limited exam. There was no significant RLE femoropopliteal DVT. The calf veins were not visualized.   During the interim, she has felt "good". She did not receive her Prolia shot in 07/2019.  She denies any dental issues.   She performs monthly breast exams. She has no concerns. She notes stinging and burning rash under her breast on occasion which improves with topical Nystatin. She denies any adenopathy. She denies any B symptoms.    Past Medical History:  Diagnosis Date  . Allergy   . Arthritis    knees, lower back  . Breast cancer ([place]) right   2011, T1A N0, radiation  . Esophageal reflux   . Hodgkin's disease ([place]) 2004  . Hypertension 2006  . Osteoporosis   . Personal history of malignant neoplasm of breast 2011   right breast  . Personal  history of radiation therapy 2011   RIGHT lumpectomy w/ radiation    Past Surgical History:  Procedure Laterality Date  . ABDOMINAL HYSTERECTOMY    . BACK SURGERY    . BREAST BIOPSY Right 2011   radiation  . BREAST LUMPECTOMY Right 2011   w/ radiation  . BREAST SURGERY Right 2011   right breast lumpectomy,L/SN/R for right breast CA   . CATARACT EXTRACTION W/PHACO Left 06/10/2018   Procedure: CATARACT EXTRACTION PHACO AND INTRAOCULAR LENS PLACEMENT ([place]) LEFT;  Surgeon: Eulogio Bear, MD;  Location: [facility];  Service: Ophthalmology;  Laterality: Left;  Diabetic - oral meds  . CATARACT EXTRACTION W/PHACO Right 07/15/2018   Procedure: CATARACT EXTRACTION PHACO AND INTRAOCULAR LENS PLACEMENT ([place]) RIGHT;  Surgeon: Eulogio Bear, MD;  Location: [facility];  Service: Ophthalmology;  Laterality: Right;  . COLONOSCOPY  2003  . PORT A CATH REVISION    . REPLACEMENT TOTAL KNEE Right 10/03/2019   DR. Harlow Mares   . WRIST SURGERY  2005    Family History  Problem Relation Age of Onset  . Cancer Father   . Bone cancer Father   . Breast cancer Mother 40  . Stroke Mother   . Colon cancer Maternal Grandmother   . Breast cancer Cousin     Social History:  reports that she has never smoked. She has never used smokeless tobacco. She reports current alcohol use of about 2.0 standard drinks of alcohol per week. She reports that  she does not use drugs.The patient is alone today.  Allergies:  Allergies  Allergen Reactions  . Shrimp [Shellfish Allergy] Hives  . Augmentin [Amoxicillin-Pot Clavulanate] Nausea Only    Current Medications: Current Outpatient Medications  Medication Sig Dispense Refill  . amLODipine-valsartan (EXFORGE) 5-160 MG tablet Take 1 tablet by mouth daily. 90 tablet 1  . anastrozole (ARIMIDEX) 1 MG tablet Take 1 tablet by mouth once daily 30 tablet 0  . aspirin 81 MG tablet Take 81 mg by mouth daily.    Marland Kitchen CALCIUM-MAGNESIUM PO Take 1 tablet by  mouth daily.    . cetirizine (ZYRTEC) 10 MG tablet Take 10 mg by mouth daily.    . Cholecalciferol (VITAMIN D-3 PO) Take 1 tablet by mouth daily.    Marland Kitchen denosumab (PROLIA) 60 MG/ML SOSY injection Inject 60 mg into the skin every 6 (six) months.    . meloxicam (MOBIC) 15 MG tablet Take 1 tablet (15 mg total) by mouth daily. 90 tablet 1  . metoprolol succinate (TOPROL-XL) 50 MG 24 hr tablet Take 1 tablet (50 mg total) by mouth daily. Take with or immediately following a meal. 90 tablet 1  . Multiple Vitamin (MULTIVITAMIN) tablet Take 1 tablet by mouth daily.    . pantoprazole (PROTONIX) 40 MG tablet Take 1 tablet (40 mg total) by mouth daily. 90 tablet 1  . SENNOSIDES-DOCUSATE SODIUM PO 2 EACH PO QAM    . tolterodine (DETROL LA) 4 MG 24 hr capsule Take 4 mg by mouth daily.    Marland Kitchen acetaminophen (TYLENOL) 650 MG CR tablet Take 1,300 mg by mouth every 8 (eight) hours as needed for pain.    . methocarbamol (ROBAXIN) 500 MG tablet Take 500 mg by mouth every 6 (six) hours.    Marland Kitchen nystatin (MYCOSTATIN/NYSTOP) powder Apply topically 2 (two) times daily.    Marland Kitchen oxyCODONE (OXY IR/ROXICODONE) 5 MG immediate release tablet Take 5 mg by mouth every 4 (four) hours as needed.     No current facility-administered medications for this visit.    Review of Systems  Constitutional: Negative for chills, fever, malaise/fatigue and weight loss (up 9 pounds since 07/31/2018).       Feels "good".  HENT: Negative.  Negative for congestion, ear pain, hearing loss, nosebleeds, sinus pain and sore throat.        No dental issues.  Eyes: Negative for blurred vision, double vision, photophobia and pain.       S/p cataract removal. Vision is good, uses reading glasses.  Respiratory: Negative.  Negative for cough, hemoptysis, sputum production, shortness of breath and wheezing.   Cardiovascular: Negative.  Negative for chest pain, palpitations, orthopnea, leg swelling and PND.  Gastrointestinal: Negative.  Negative for abdominal  pain, blood in stool, constipation, diarrhea, heartburn, melena, nausea and vomiting.  Genitourinary: Negative.  Negative for dysuria, frequency and urgency.  Musculoskeletal: Negative.  Negative for back pain, joint pain and myalgias.       Total right knee replacement (10/03/2019).  Skin: Negative for itching. Rash: on occasion.       Candidal rash under breast treated as needed with topical Nystatin.  Neurological: Negative.  Negative for dizziness, sensory change, speech change, focal weakness, weakness and headaches.  Endo/Heme/Allergies: Does not bruise/bleed easily.       Thyroid nodules (benign; sees endocrinology).   Psychiatric/Behavioral: Negative.  Negative for depression and memory loss. The patient is not nervous/anxious and does not have insomnia.   All other systems reviewed and are negative.  Performance  status (ECOG): 1  Vitals Blood pressure 139/78, pulse 91, temperature (!) 96.1 F (35.6 C), temperature source Tympanic, resp. rate 18, height 5' 7"  (1.702 m), weight 216 lb 14.9 oz (98.4 kg), SpO2 99 %.   Physical Exam  Constitutional: She is oriented to person, place, and time. She appears well-developed and well-nourished. No distress.  HENT:  Head: Normocephalic and atraumatic.  Mouth/Throat: Oropharynx is clear and moist. No oropharyngeal exudate.  Short hair. Mask.  Eyes: Pupils are equal, round, and reactive to light. Conjunctivae and EOM are normal. No scleral icterus.  Glasses. Brown eyes.  Cardiovascular: Normal rate, regular rhythm and normal heart sounds.  No murmur heard. Pulmonary/Chest: Effort normal and breath sounds normal. No respiratory distress. She has no wheezes. She has no rales. She exhibits no tenderness. Right breast exhibits no inverted nipple, no mass, no nipple discharge, no skin change (3 cm area of scarring 12 cm from the nipple at the 1 o'clock position) and no tenderness. Left breast exhibits no inverted nipple, no mass, no nipple  discharge, no skin change (fibrocystic changes) and no tenderness.  Moisture under LEFT breast without rash.  Abdominal: Soft. Bowel sounds are normal. She exhibits no distension and no mass. There is no abdominal tenderness. There is no rebound and no guarding.  Musculoskeletal:        General: No tenderness or edema. Normal range of motion.     Cervical back: Normal range of motion and neck supple.  Lymphadenopathy:    She has no cervical adenopathy.    She has no axillary adenopathy.       Right: No supraclavicular adenopathy present.       Left: No supraclavicular adenopathy present.  Neurological: She is alert and oriented to person, place, and time.  Skin: Skin is warm and dry. She is not diaphoretic.  Psychiatric: She has a normal mood and affect. Her behavior is normal. Judgment and thought content normal.  Nursing note and vitals reviewed.   Appointment on 12/26/2019  Component Date Value Ref Range Status  . Sodium 12/26/2019 135  135 - 145 mmol/L Final  . Potassium 12/26/2019 3.9  3.5 - 5.1 mmol/L Final  . Chloride 12/26/2019 102  98 - 111 mmol/L Final  . CO2 12/26/2019 24  22 - 32 mmol/L Final  . Glucose, Bld 12/26/2019 92  70 - 99 mg/dL Final   Glucose reference range applies only to samples taken after fasting for at least 8 hours.  . BUN 12/26/2019 18  8 - 23 mg/dL Final  . Creatinine, Ser 12/26/2019 0.51  0.44 - 1.00 mg/dL Final  . Calcium 12/26/2019 9.3  8.9 - 10.3 mg/dL Final  . Total Protein 12/26/2019 7.9  6.5 - 8.1 g/dL Final  . Albumin 12/26/2019 4.1  3.5 - 5.0 g/dL Final  . AST 12/26/2019 22  15 - 41 U/L Final  . ALT 12/26/2019 23  0 - 44 U/L Final  . Alkaline Phosphatase 12/26/2019 88  38 - 126 U/L Final  . Total Bilirubin 12/26/2019 0.8  0.3 - 1.2 mg/dL Final  . GFR calc non Af Amer 12/26/2019 >60  >60 mL/min Final  . GFR calc Af Amer 12/26/2019 >60  >60 mL/min Final  . Anion gap 12/26/2019 9  5 - 15 Final   Performed at [facility], [address], [place], [place] [address]  . WBC 12/26/2019 6.2  4.0 - 10.5 K/uL Final  . RBC 12/26/2019 4.27  3.87 - 5.11  MIL/uL Final  . Hemoglobin 12/26/2019 12.6  12.0 - 15.0 g/dL Final  . HCT 12/26/2019 38.1  36.0 - 46.0 % Final  . MCV 12/26/2019 89.2  80.0 - 100.0 fL Final  . MCH 12/26/2019 29.5  26.0 - 34.0 pg Final  . MCHC 12/26/2019 33.1  30.0 - 36.0 g/dL Final  . RDW 12/26/2019 14.9  11.5 - 15.5 % Final  . Platelets 12/26/2019 236  150 - 400 K/uL Final  . nRBC 12/26/2019 0.0  0.0 - 0.2 % Final  . Neutrophils Relative % 12/26/2019 47  % Final  . Neutro Abs 12/26/2019 3.0  1.7 - 7.7 K/uL Final  . Lymphocytes Relative 12/26/2019 35  % Final  . Lymphs Abs 12/26/2019 2.2  0.7 - 4.0 K/uL Final  . Monocytes Relative 12/26/2019 10  % Final  . Monocytes Absolute 12/26/2019 0.6  0.1 - 1.0 K/uL Final  . Eosinophils Relative 12/26/2019 7  % Final  . Eosinophils Absolute 12/26/2019 0.4  0.0 - 0.5 K/uL Final  . Basophils Relative 12/26/2019 1  % Final  . Basophils Absolute 12/26/2019 0.0  0.0 - 0.1 K/uL Final  . Immature Granulocytes 12/26/2019 0  % Final  . Abs Immature Granulocytes 12/26/2019 0.01  0.00 - 0.07 K/uL Final   Performed at [facility], [address], [place], [place] [address]    Assessment:  Mary Todd is a 74 y.o. female with stage II Hodgkin's disease(2004) and stage IA right breast cancer(2011).  She was diagnosed with stage IIA Hodgkin's diseaseafter presenting with low counts and joint pain. Abdomen and pelvic CT scan as well as PET scan revealed a pelvic mass and retroperitoneal lymphadenopathy. Biopsy confirmed Hodgkin's disease.   She received ABVDtimes 5 through cycle 3-A and then AVDwithout the Bleomycin times 7 subsequent cycles. Treatment completed in 12/2003.   CT and PET scanswere negative in 08/2007and in 06/2006.  She underwent right breast lumpectomyand sentinel lymph node biopsy on 07/21/2010. Pathology revealed a  0.5 cm grade II invasive ductal carcinoma. Two sentinel lymph nodes were negative. Tumor was ER + (>90%), PR + (40%), and Her2/neu 1+. Pathologic stage was T1aN0Mx. She received Mammositeradiation (completed 08/22/2010).  Oncotype DXtesting revealed a low risk lesion. BRCA1/2testing was negative. She received 5 years of an aromatase inhibitor.  Breast cancer index(BCI) testing revealed a 6.3% (CI: 2.9%-9.6%) risk of late recurrent disease (years 5-10) after 5 years of hormonal therapy and a high likelihood of benefit from continued hormonal therapy (30% reduction). Decision was made to continueArimidexfor 5 more years.  Bilateral mammogram on 07/09/2019 revealed no evidence of malignancy.   CA27.29 has been followed: 8.4 on 07/19/2016, 13.4 on 01/24/2017, 7.1 on 08/01/2017, 12.1 on 01/30/2018, 5.7 on 07/31/2018, and 12.1 on 02/25/2019.  Thyroid ultrasoundon 04/13/2007 revealed a 2.3 cm solid and cystic nodule within the left thyroid gland. There was also a small nodule within the right thyroid gland that did not meet size criteria for measurement. There was a single benign appearing lymph node within the right neck. FNAin 06/2016 was "indeterminate" and in 08/2016 was "benign".   Bone density studyon 09/05/2011 revealed osteopeniawith a T-score of -1.7 in L1-L4. Bone density studyon 11/14/2012 revealed osteopenia with a T-score of -1.5 in L1-L4. Bone density studyon 12/14/2016 revealed osteoporosis in L2-3 with a T-score of -3.1. Left femur normal with T-score of -0.8. Bone density on 05/05/2019 revealed osteopenia with a T-score of -2.4 in the AP spine L2-L3.  She began Proliaon 01/24/2017 (last 02/25/2019).  Symptomatically, she denies any breast concerns.  She denies any B symptoms.  Exam reveals no adenopathy or hepatosplenomegaly.  Plan: 1.  Labs today: CBC with diff, CMP, CA 27.29. 2. Stage IA right breast cancer Clinically, she continues  to do well.             Bilateral mammogram on 07/09/2019 revealed no evidence of disease.  Continue Arimidex (completes 08/30/2020). Schedule mammogram on 07/09/2020. 3. Stage II Hodgkin's disease Clinically, she continues to do well out adenopathy, hepatosplenomegaly or B symptoms.             Labs remain normal. Imaging only if clinically indicated.             Continue surveillance. 4. Osteoporosis Review bone density on 05/05/2019 which confirmed osteopenia (improved).  Continue calcium and vitamin D. Patient denies any dental issues.    Prolia today (last 02/25/2019). 5. Thyroid nodule Followed by Dr Gabriel Carina, endocrinology. 6.   RTC in 6 months for labs (BMP) and Prolia. 7.   RTC in 1 year for MD assessment, labs (CBC with diff, CMP, LDH, CA27.29), review of mammogram, and Prolia.  I discussed the assessment and treatment plan with the patient.  The patient was provided an opportunity to ask questions and all were answered.  The patient agreed with the plan and demonstrated an understanding of the instructions.  The patient was advised to call back if the symptoms worsen or if the condition fails to improve as anticipated.   Lequita Asal, MD, PhD    12/26/2019, 10:47 AM  I, Selena Batten, am acting as scribe for [employer]. Mike Gip, MD, PhD.  I, Shyam Dawson C. Mike Gip, MD, have reviewed the above documentation for accuracy and completeness, and I agree with the above.

## 2019-12-25 ENCOUNTER — Other Ambulatory Visit: Payer: Self-pay

## 2019-12-25 ENCOUNTER — Encounter: Payer: Self-pay | Admitting: Hematology and Oncology

## 2019-12-25 NOTE — Progress Notes (Signed)
No new changes noted today. The patient name and DOB has been verified by phone today. 

## 2019-12-26 ENCOUNTER — Ambulatory Visit: Payer: Self-pay

## 2019-12-26 ENCOUNTER — Telehealth: Payer: Self-pay

## 2019-12-26 ENCOUNTER — Inpatient Hospital Stay: Payer: Medicare Other

## 2019-12-26 ENCOUNTER — Inpatient Hospital Stay: Payer: Medicare Other | Attending: Hematology and Oncology | Admitting: Hematology and Oncology

## 2019-12-26 VITALS — BP 139/78 | HR 91 | Temp 96.1°F | Resp 18 | Ht 67.0 in | Wt 216.9 lb

## 2019-12-26 DIAGNOSIS — C50911 Malignant neoplasm of unspecified site of right female breast: Secondary | ICD-10-CM | POA: Diagnosis not present

## 2019-12-26 DIAGNOSIS — M810 Age-related osteoporosis without current pathological fracture: Secondary | ICD-10-CM | POA: Insufficient documentation

## 2019-12-26 DIAGNOSIS — C8193 Hodgkin lymphoma, unspecified, intra-abdominal lymph nodes: Secondary | ICD-10-CM | POA: Diagnosis not present

## 2019-12-26 LAB — COMPREHENSIVE METABOLIC PANEL
ALT: 23 U/L (ref 0–44)
AST: 22 U/L (ref 15–41)
Albumin: 4.1 g/dL (ref 3.5–5.0)
Alkaline Phosphatase: 88 U/L (ref 38–126)
Anion gap: 9 (ref 5–15)
BUN: 18 mg/dL (ref 8–23)
CO2: 24 mmol/L (ref 22–32)
Calcium: 9.3 mg/dL (ref 8.9–10.3)
Chloride: 102 mmol/L (ref 98–111)
Creatinine, Ser: 0.51 mg/dL (ref 0.44–1.00)
GFR calc Af Amer: >60 mL/min (ref >60)
GFR calc non Af Amer: >60 mL/min (ref >60)
Glucose, Bld: 92 mg/dL (ref 70–99)
Potassium: 3.9 mmol/L (ref 3.5–5.1)
Sodium: 135 mmol/L (ref 135–145)
Total Bilirubin: 0.8 mg/dL (ref 0.3–1.2)
Total Protein: 7.9 g/dL (ref 6.5–8.1)

## 2019-12-26 LAB — CBC WITH DIFFERENTIAL/PLATELET
Abs Immature Granulocytes: 0.01 10*3/uL (ref 0.00–0.07)
Basophils Absolute: 0 10*3/uL (ref 0.0–0.1)
Basophils Relative: 1 %
Eosinophils Absolute: 0.4 10*3/uL (ref 0.0–0.5)
Eosinophils Relative: 7 %
HCT: 38.1 % (ref 36.0–46.0)
Hemoglobin: 12.6 g/dL (ref 12.0–15.0)
Immature Granulocytes: 0 %
Lymphocytes Relative: 35 %
Lymphs Abs: 2.2 10*3/uL (ref 0.7–4.0)
MCH: 29.5 pg (ref 26.0–34.0)
MCHC: 33.1 g/dL (ref 30.0–36.0)
MCV: 89.2 fL (ref 80.0–100.0)
Monocytes Absolute: 0.6 10*3/uL (ref 0.1–1.0)
Monocytes Relative: 10 %
Neutro Abs: 3 10*3/uL (ref 1.7–7.7)
Neutrophils Relative %: 47 %
Platelets: 236 10*3/uL (ref 150–400)
RBC: 4.27 MIL/uL (ref 3.87–5.11)
RDW: 14.9 % (ref 11.5–15.5)
WBC: 6.2 10*3/uL (ref 4.0–10.5)
nRBC: 0 % (ref 0.0–0.2)

## 2019-12-26 MED ORDER — DENOSUMAB 60 MG/ML ~~LOC~~ SOSY
60.0000 mg | PREFILLED_SYRINGE | Freq: Once | SUBCUTANEOUS | Status: AC
  Administered 2019-12-26: 60 mg via SUBCUTANEOUS

## 2019-12-26 NOTE — Patient Instructions (Signed)
Denosumab injection What is this medicine? DENOSUMAB (den oh sue mab) slows bone breakdown. Prolia is used to treat osteoporosis in women after menopause and in men, and in people who are taking corticosteroids for 6 months or more. Xgeva is used to treat a high calcium level due to cancer and to prevent bone fractures and other bone problems caused by multiple myeloma or cancer bone metastases. Xgeva is also used to treat giant cell tumor of the bone. This medicine may be used for other purposes; ask your health care provider or pharmacist if you have questions. COMMON BRAND NAME(S): Prolia, XGEVA What should I tell my health care provider before I take this medicine? They need to know if you have any of these conditions:  dental disease  having surgery or tooth extraction  infection  kidney disease  low levels of calcium or Vitamin D in the blood  malnutrition  on hemodialysis  skin conditions or sensitivity  thyroid or parathyroid disease  an unusual reaction to denosumab, other medicines, foods, dyes, or preservatives  pregnant or trying to get pregnant  breast-feeding How should I use this medicine? This medicine is for injection under the skin. It is given by a health care professional in a hospital or clinic setting. A special MedGuide will be given to you before each treatment. Be sure to read this information carefully each time. For Prolia, talk to your pediatrician regarding the use of this medicine in children. Special care may be needed. For Xgeva, talk to your pediatrician regarding the use of this medicine in children. While this drug may be prescribed for children as young as 13 years for selected conditions, precautions do apply. Overdosage: If you think you have taken too much of this medicine contact a poison control center or emergency room at once. NOTE: This medicine is only for you. Do not share this medicine with others. What if I miss a dose? It is  important not to miss your dose. Call your doctor or health care professional if you are unable to keep an appointment. What may interact with this medicine? Do not take this medicine with any of the following medications:  other medicines containing denosumab This medicine may also interact with the following medications:  medicines that lower your chance of fighting infection  steroid medicines like prednisone or cortisone This list may not describe all possible interactions. Give your health care provider a list of all the medicines, herbs, non-prescription drugs, or dietary supplements you use. Also tell them if you smoke, drink alcohol, or use illegal drugs. Some items may interact with your medicine. What should I watch for while using this medicine? Visit your doctor or health care professional for regular checks on your progress. Your doctor or health care professional may order blood tests and other tests to see how you are doing. Call your doctor or health care professional for advice if you get a fever, chills or sore throat, or other symptoms of a cold or flu. Do not treat yourself. This drug may decrease your body's ability to fight infection. Try to avoid being around people who are sick. You should make sure you get enough calcium and vitamin D while you are taking this medicine, unless your doctor tells you not to. Discuss the foods you eat and the vitamins you take with your health care professional. See your dentist regularly. Brush and floss your teeth as directed. Before you have any dental work done, tell your dentist you are   receiving this medicine. Do not become pregnant while taking this medicine or for 5 months after stopping it. Talk with your doctor or health care professional about your birth control options while taking this medicine. Women should inform their doctor if they wish to become pregnant or think they might be pregnant. There is a potential for serious side  effects to an unborn child. Talk to your health care professional or pharmacist for more information. What side effects may I notice from receiving this medicine? Side effects that you should report to your doctor or health care professional as soon as possible:  allergic reactions like skin rash, itching or hives, swelling of the face, lips, or tongue  bone pain  breathing problems  dizziness  jaw pain, especially after dental work  redness, blistering, peeling of the skin  signs and symptoms of infection like fever or chills; cough; sore throat; pain or trouble passing urine  signs of low calcium like fast heartbeat, muscle cramps or muscle pain; pain, tingling, numbness in the hands or feet; seizures  unusual bleeding or bruising  unusually weak or tired Side effects that usually do not require medical attention (report to your doctor or health care professional if they continue or are bothersome):  constipation  diarrhea  headache  joint pain  loss of appetite  muscle pain  runny nose  tiredness  upset stomach This list may not describe all possible side effects. Call your doctor for medical advice about side effects. You may report side effects to FDA at 1-800-FDA-1088. Where should I keep my medicine? This medicine is only given in a clinic, doctor's office, or other health care setting and will not be stored at home. NOTE: This sheet is a summary. It may not cover all possible information. If you have questions about this medicine, talk to your doctor, pharmacist, or health care provider.  2020 Elsevier/Gold Standard (2018-02-22 16:10:44)

## 2019-12-26 NOTE — Chronic Care Management (AMB) (Signed)
  Chronic Care Management   Outreach Note  12/26/2019 Name: Adisynn Arostegui MRN: [id number] DOB: Oct 02, 1946   Primary Care Provider: Steele Sizer, MD Reason for referral : Chronic Case Management    An unsuccessful telephone outreach was attempted today. Ms. Qui is currently engaged with the Chronic Case Management team. A routine outreach was attempted today.   Follow Up Plan The care management team will reach out to Ms. Cockrum again within two weeks.   [facility] Care Management [phone]

## 2019-12-26 NOTE — Patient Instructions (Signed)
  Discontinue Arimidex on 08/30/2020.

## 2019-12-27 LAB — CANCER ANTIGEN 27.29: CA 27.29: 7 U/mL (ref 0.0–38.6)

## 2019-12-29 ENCOUNTER — Other Ambulatory Visit: Payer: Self-pay

## 2019-12-29 ENCOUNTER — Telehealth: Payer: Self-pay | Admitting: *Deleted

## 2019-12-29 ENCOUNTER — Encounter: Payer: Self-pay | Admitting: Urology

## 2019-12-29 ENCOUNTER — Ambulatory Visit: Payer: Medicare Other | Admitting: Urology

## 2019-12-29 ENCOUNTER — Ambulatory Visit (INDEPENDENT_AMBULATORY_CARE_PROVIDER_SITE_OTHER): Payer: Medicare Other | Admitting: Urology

## 2019-12-29 VITALS — BP 162/91 | HR 94 | Ht 67.0 in | Wt 216.0 lb

## 2019-12-29 DIAGNOSIS — R350 Frequency of micturition: Secondary | ICD-10-CM

## 2019-12-29 NOTE — Progress Notes (Signed)
12/29/2019 1:47 PM   Mary Todd 30-Aug-1946 [id number]  Referring provider: Steele Sizer, MD [address] [place],  [place] [address]  Chief Complaint  Patient presents with  . Follow-up    HPI: Iwas consulted by the above provider to assist the patient's urge incontinence worsening over many months. She has urge incontinence wearing 1 pad a day damp. She denies stress incontinence bedwetting. She voids every 2-3 hours and gets up 3-4 times a night   The patient had mild descensus at rest with grade 2 hypermobility the bladder neck and minimal movement. No stress incontinence. Small high grade 1 cystocele. She had a suburethral swelling nontender and she likely does not have a diverticulum  Patient has mild urge incontinence and moderate nighttime frequency I will reassess the patient on Myrbetriq. Desmopressin is a future option. Assessed on Myrbetriq in about 6 weeks. She said her primary most bothersome symptom is the nighttime frequency. Percutaneous tibial nerve stimulation is another option  Frequency and urgency improved during the day but still gets up at night similar amount. The patient was given oxybutynin ER 10 mg and Detrol LA 4 mg and reassess in 8 weeks. If she does not reach her goal I will discussed desmopressin recognizing potential cost issues and percutaneous tibial nerve stimulation for milder OAB symptoms and nighttime frequency  Frequency and urge incontinence and nocturia all better on tolterodine.  Oxybutynin did not help.  Gets up once a night depending on fluid intake.      Today Frequency stable Tolterodine not working as well and now she is getting up more at night which is affecting her quality life.  Clinically not infected  Due to insurance I did not offer desmopressin.  I went through percutaneous tibial nerve stimulation with template.  Handout given.   PMH: Past Medical History:  Diagnosis Date  .  Allergy   . Arthritis    knees, lower back  . Breast cancer ([facility]) right   2011, T1A N0, radiation  . Esophageal reflux   . Hodgkin's disease ([facility]) 2004  . Hypertension 2006  . Osteoporosis   . Personal history of malignant neoplasm of breast 2011   right breast  . Personal history of radiation therapy 2011   RIGHT lumpectomy w/ radiation    Surgical History: Past Surgical History:  Procedure Laterality Date  . ABDOMINAL HYSTERECTOMY    . BACK SURGERY    . BREAST BIOPSY Right 2011   radiation  . BREAST LUMPECTOMY Right 2011   w/ radiation  . BREAST SURGERY Right 2011   right breast lumpectomy,L/SN/R for right breast CA   . CATARACT EXTRACTION W/PHACO Left 06/10/2018   Procedure: CATARACT EXTRACTION PHACO AND INTRAOCULAR LENS PLACEMENT ([facility]) LEFT;  Surgeon: Eulogio Bear, MD;  Location: [facility];  Service: Ophthalmology;  Laterality: Left;  Diabetic - oral meds  . CATARACT EXTRACTION W/PHACO Right 07/15/2018   Procedure: CATARACT EXTRACTION PHACO AND INTRAOCULAR LENS PLACEMENT ([facility]) RIGHT;  Surgeon: Eulogio Bear, MD;  Location: [facility];  Service: Ophthalmology;  Laterality: Right;  . COLONOSCOPY  2003  . PORT A CATH REVISION    . REPLACEMENT TOTAL KNEE Right 10/03/2019   DR. Harlow Mares   . WRIST SURGERY  2005    Home Medications:  Allergies as of 12/29/2019      Reactions   Shrimp [shellfish Allergy] Hives   Augmentin [amoxicillin-pot Clavulanate] Nausea Only      Medication List  Accurate as of December 29, 2019  1:47 PM. If you have any questions, ask your nurse or doctor.        STOP taking these medications   methocarbamol 500 MG tablet Commonly known as: ROBAXIN Stopped by: [name], MD   oxyCODONE 5 MG immediate release tablet Commonly known as: Oxy IR/ROXICODONE Stopped by: [name], MD     TAKE these medications   acetaminophen 650 MG CR tablet Commonly known as: TYLENOL Take 1,300 mg by mouth every 8  (eight) hours as needed for pain.   amLODipine-valsartan 5-160 MG tablet Commonly known as: Exforge Take 1 tablet by mouth daily.   anastrozole 1 MG tablet Commonly known as: ARIMIDEX Take 1 tablet by mouth once daily   aspirin 81 MG tablet Take 81 mg by mouth daily.   CALCIUM-MAGNESIUM PO Take 1 tablet by mouth daily.   cetirizine 10 MG tablet Commonly known as: ZYRTEC Take 10 mg by mouth daily.   denosumab 60 MG/ML Sosy injection Commonly known as: PROLIA Inject 60 mg into the skin every 6 (six) months.   meloxicam 15 MG tablet Commonly known as: MOBIC Take 1 tablet (15 mg total) by mouth daily.   metoprolol succinate 50 MG 24 hr tablet Commonly known as: TOPROL-XL Take 1 tablet (50 mg total) by mouth daily. Take with or immediately following a meal.   multivitamin tablet Take 1 tablet by mouth daily.   nystatin powder Commonly known as: MYCOSTATIN/NYSTOP Apply topically 2 (two) times daily.   pantoprazole 40 MG tablet Commonly known as: PROTONIX Take 1 tablet (40 mg total) by mouth daily.   SENNOSIDES-DOCUSATE SODIUM PO 2 EACH PO QAM   tolterodine 4 MG 24 hr capsule Commonly known as: DETROL LA Take 4 mg by mouth daily.   VITAMIN D-3 PO Take 1 tablet by mouth daily.       Allergies:  Allergies  Allergen Reactions  . Shrimp [Shellfish Allergy] Hives  . Augmentin [Amoxicillin-Pot Clavulanate] Nausea Only    Family History: Family History  Problem Relation Age of Onset  . Cancer Father   . Bone cancer Father   . Breast cancer Mother 25  . Stroke Mother   . Colon cancer Maternal Grandmother   . Breast cancer Cousin     Social History:  reports that she has never smoked. She has never used smokeless tobacco. She reports current alcohol use of about 2.0 standard drinks of alcohol per week. She reports that she does not use drugs.  ROS:                                        Physical Exam: BP (!) 162/91   Pulse 94    Ht 5\' 7"  (1.702 m)   Wt 216 lb (98 kg)   BMI 33.83 kg/m     Laboratory Data: Lab Results  Component Value Date   WBC 6.2 12/26/2019   HGB 12.6 12/26/2019   HCT 38.1 12/26/2019   MCV 89.2 12/26/2019   PLT 236 12/26/2019    Lab Results  Component Value Date   CREATININE 0.51 12/26/2019    No results found for: PSA  No results found for: TESTOSTERONE  Lab Results  Component Value Date   HGBA1C 5.6 08/11/2019    Urinalysis    Component Value Date/Time   COLORURINE YELLOW (A) 12/04/2018 0906   APPEARANCEUR Cloudy (  A) 05/05/2019 1515   LABSPEC 1.009 12/04/2018 0906   LABSPEC 1.026 03/19/2012 2152   PHURINE 6.0 12/04/2018 0906   GLUCOSEU Negative 05/05/2019 1515   GLUCOSEU Negative 03/19/2012 2152   HGBUR NEGATIVE 12/04/2018 0906   BILIRUBINUR Negative 05/05/2019 1515   BILIRUBINUR Negative 03/19/2012 2152   KETONESUR NEGATIVE 12/04/2018 0906   PROTEINUR 1+ (A) 05/05/2019 1515   PROTEINUR NEGATIVE 12/04/2018 0906   NITRITE Negative 05/05/2019 1515   NITRITE NEGATIVE 12/04/2018 0906   LEUKOCYTESUR 1+ (A) 05/05/2019 1515   LEUKOCYTESUR Trace 03/19/2012 2152    Pertinent Imaging:   Assessment & Plan: If she fails percutaneous tibial nerve stimulation we can see if desmopressin is covered but is probably not.  There are no diagnoses linked to this encounter.  No follow-ups on file.  [name], MD  [facility] [address] [place], [place] [address] [phone]

## 2019-12-29 NOTE — Addendum Note (Signed)
Addended by: Verlene Mayer A on: 12/29/2019 02:57 PM   Modules accepted: Orders

## 2019-12-29 NOTE — Telephone Encounter (Signed)
Patient is approved for PTNS. No Auth needed.

## 2019-12-30 LAB — URINALYSIS, COMPLETE
Bilirubin, UA: NEGATIVE
Glucose, UA: NEGATIVE
Ketones, UA: NEGATIVE
Nitrite, UA: NEGATIVE
RBC, UA: NEGATIVE
Specific Gravity, UA: 1.025 (ref 1.005–1.030)
Urobilinogen, Ur: 0.2 mg/dL (ref 0.2–1.0)
pH, UA: 5.5 (ref 5.0–7.5)

## 2019-12-30 LAB — MICROSCOPIC EXAMINATION: RBC, Urine: NONE SEEN /hpf (ref 0–2)

## 2020-01-01 ENCOUNTER — Other Ambulatory Visit: Payer: Self-pay

## 2020-01-01 ENCOUNTER — Ambulatory Visit (INDEPENDENT_AMBULATORY_CARE_PROVIDER_SITE_OTHER): Payer: Medicare Other

## 2020-01-01 VITALS — Ht 67.0 in | Wt 216.0 lb

## 2020-01-01 DIAGNOSIS — Z0000 Encounter for general adult medical examination without abnormal findings: Secondary | ICD-10-CM | POA: Diagnosis not present

## 2020-01-01 NOTE — Patient Instructions (Signed)
[name] , Thank you for taking time to come for your Medicare Wellness Visit. I appreciate your ongoing commitment to your health goals. Please review the following plan we discussed and let me know if I can assist you in the future.   Screening recommendations/referrals: Colonoscopy: done 06/26/12. Repeat in 2023.  Mammogram: done 07/09/19. Please call [phone] to schedule your mammogram.  Bone Density: done 05/05/19. Repeat in 2022 Recommended yearly ophthalmology/optometry visit for glaucoma screening and checkup Recommended yearly dental visit for hygiene and checkup  Vaccinations: Influenza vaccine: done 08/11/19 Pneumococcal vaccine: done 05/04/16 Tdap vaccine: done 09/16/10 Shingles vaccine: Shingrix series completed 2019   Covid-19: 1st dose 12/05/19  Advanced directives: Please bring a copy of your health care power of attorney and living will to the office at your convenience.  Conditions/risks identified: Recommend drinking 6-8 glasses of water per day  Next appointment: Please follow up in one year for your Medicare Annual Wellness visit.     Preventive Care 73 Years and Older, Female Preventive care refers to lifestyle choices and visits with your health care provider that can promote health and wellness. What does preventive care include?  A yearly physical exam. This is also called an annual well check.  Dental exams once or twice a year.  Routine eye exams. Ask your health care provider how often you should have your eyes checked.  Personal lifestyle choices, including:  Daily care of your teeth and gums.  Regular physical activity.  Eating a healthy diet.  Avoiding tobacco and drug use.  Limiting alcohol use.  Practicing safe sex.  Taking low-dose aspirin every day.  Taking vitamin and mineral supplements as recommended by your health care provider. What happens during an annual well check? The services and screenings done by your health care  provider during your annual well check will depend on your age, overall health, lifestyle risk factors, and family history of disease. Counseling  Your health care provider may ask you questions about your:  Alcohol use.  Tobacco use.  Drug use.  Emotional well-being.  Home and relationship well-being.  Sexual activity.  Eating habits.  History of falls.  Memory and ability to understand (cognition).  Work and work Statistician.  Reproductive health. Screening  You may have the following tests or measurements:  Height, weight, and BMI.  Blood pressure.  Lipid and cholesterol levels. These may be checked every 5 years, or more frequently if you are over 59 years old.  Skin check.  Lung cancer screening. You may have this screening every year starting at age 49 if you have a 30-pack-year history of smoking and currently smoke or have quit within the past 15 years.  Fecal occult blood test (FOBT) of the stool. You may have this test every year starting at age 81.  Flexible sigmoidoscopy or colonoscopy. You may have a sigmoidoscopy every 5 years or a colonoscopy every 10 years starting at age 20.  Hepatitis C blood test.  Hepatitis B blood test.  Sexually transmitted disease (STD) testing.  Diabetes screening. This is done by checking your blood sugar (glucose) after you have not eaten for a while (fasting). You may have this done every 1-3 years.  Bone density scan. This is done to screen for osteoporosis. You may have this done starting at age 5.  Mammogram. This may be done every 1-2 years. Talk to your health care provider about how often you should have regular mammograms. Talk with your health care provider about your  test results, treatment options, and if necessary, the need for more tests. Vaccines  Your health care provider may recommend certain vaccines, such as:  Influenza vaccine. This is recommended every year.  Tetanus, diphtheria, and acellular  pertussis (Tdap, Td) vaccine. You may need a Td booster every 10 years.  Zoster vaccine. You may need this after age 27.  Pneumococcal 13-valent conjugate (PCV13) vaccine. One dose is recommended after age 37.  Pneumococcal polysaccharide (PPSV23) vaccine. One dose is recommended after age 23. Talk to your health care provider about which screenings and vaccines you need and how often you need them. This information is not intended to replace advice given to you by your health care provider. Make sure you discuss any questions you have with your health care provider. Document Released: 11/12/2015 Document Revised: 07/05/2016 Document Reviewed: 08/17/2015 Elsevier Interactive Patient Education  2017 [place] Prevention in the Home Falls can cause injuries. They can happen to people of all ages. There are many things you can do to make your home safe and to help prevent falls. What can I do on the outside of my home?  Regularly fix the edges of walkways and driveways and fix any cracks.  Remove anything that might make you trip as you walk through a door, such as a raised step or threshold.  Trim any bushes or trees on the path to your home.  Use bright outdoor lighting.  Clear any walking paths of anything that might make someone trip, such as rocks or tools.  Regularly check to see if handrails are loose or broken. Make sure that both sides of any steps have handrails.  Any raised decks and porches should have guardrails on the edges.  Have any leaves, snow, or ice cleared regularly.  Use sand or salt on walking paths during winter.  Clean up any spills in your garage right away. This includes oil or grease spills. What can I do in the bathroom?  Use night lights.  Install grab bars by the toilet and in the tub and shower. Do not use towel bars as grab bars.  Use non-skid mats or decals in the tub or shower.  If you need to sit down in the shower, use a plastic,  non-slip stool.  Keep the floor dry. Clean up any water that spills on the floor as soon as it happens.  Remove soap buildup in the tub or shower regularly.  Attach bath mats securely with double-sided non-slip rug tape.  Do not have throw rugs and other things on the floor that can make you trip. What can I do in the bedroom?  Use night lights.  Make sure that you have a light by your bed that is easy to reach.  Do not use any sheets or blankets that are too big for your bed. They should not hang down onto the floor.  Have a firm chair that has side arms. You can use this for support while you get dressed.  Do not have throw rugs and other things on the floor that can make you trip. What can I do in the kitchen?  Clean up any spills right away.  Avoid walking on wet floors.  Keep items that you use a lot in easy-to-reach places.  If you need to reach something above you, use a strong step stool that has a grab bar.  Keep electrical cords out of the way.  Do not use floor polish or wax that  makes floors slippery. If you must use wax, use non-skid floor wax.  Do not have throw rugs and other things on the floor that can make you trip. What can I do with my stairs?  Do not leave any items on the stairs.  Make sure that there are handrails on both sides of the stairs and use them. Fix handrails that are broken or loose. Make sure that handrails are as long as the stairways.  Check any carpeting to make sure that it is firmly attached to the stairs. Fix any carpet that is loose or worn.  Avoid having throw rugs at the top or bottom of the stairs. If you do have throw rugs, attach them to the floor with carpet tape.  Make sure that you have a light switch at the top of the stairs and the bottom of the stairs. If you do not have them, ask someone to add them for you. What else can I do to help prevent falls?  Wear shoes that:  Do not have high heels.  Have rubber  bottoms.  Are comfortable and fit you well.  Are closed at the toe. Do not wear sandals.  If you use a stepladder:  Make sure that it is fully opened. Do not climb a closed stepladder.  Make sure that both sides of the stepladder are locked into place.  Ask someone to hold it for you, if possible.  Clearly mark and make sure that you can see:  Any grab bars or handrails.  First and last steps.  Where the edge of each step is.  Use tools that help you move around (mobility aids) if they are needed. These include:  Canes.  Walkers.  Scooters.  Crutches.  Turn on the lights when you go into a dark area. Replace any light bulbs as soon as they burn out.  Set up your furniture so you have a clear path. Avoid moving your furniture around.  If any of your floors are uneven, fix them.  If there are any pets around you, be aware of where they are.  Review your medicines with your doctor. Some medicines can make you feel dizzy. This can increase your chance of falling. Ask your doctor what other things that you can do to help prevent falls. This information is not intended to replace advice given to you by your health care provider. Make sure you discuss any questions you have with your health care provider. Document Released: 08/12/2009 Document Revised: 03/23/2016 Document Reviewed: 11/20/2014 Elsevier Interactive Patient Education  2017 [employer].

## 2020-01-01 NOTE — Progress Notes (Signed)
Subjective:   Mary Todd is a 74 y.o. female who presents for Medicare Annual (Subsequent) preventive examination.  Virtual Visit via Telephone Note  I connected with Mary Todd on 01/01/20 at 10:00 AM EST by telephone and verified that I am speaking with the correct person using two identifiers.  Medicare Annual Wellness visit completed telephonically due to Covid-19 pandemic.   Location: Patient: home Provider: office   I discussed the limitations, risks, security and privacy concerns of performing an evaluation and management service by telephone and the availability of in person appointments. The patient expressed understanding and agreed to proceed.  Some vital signs may be absent or patient reported.   Clemetine Marker, LPN     Review of Systems:   Cardiac Risk Factors include: advanced age (>1men, >22 women);hypertension;obesity (BMI >30kg/m2)     Objective:     Vitals: Ht 5\' 7"  (1.702 m)   Wt 216 lb (98 kg)   BMI 33.83 kg/m   Body mass index is 33.83 kg/m.  Advanced Directives 01/01/2020 12/25/2019 03/05/2019 12/27/2018 12/04/2018 07/31/2018 07/15/2018  Does Patient Have a Medical Advance Directive? No No No Yes No No No;Yes  Type of Advance Directive - - - Living will;Healthcare Power of [place];Living will  Does patient want to make changes to medical advance directive? - - - - - - No - Patient declined  Copy of [place] in Chart? - - - No - copy requested - - No - copy requested  Would patient like information on creating a medical advance directive? No - Patient declined No - Patient declined No - Patient declined - - - No - Patient declined    Tobacco Social History   Tobacco Use  Smoking Status Never Smoker  Smokeless Tobacco Never Used  Tobacco Comment   smoking cessation materials not required     Counseling given: Not Answered Comment: smoking cessation materials not  required   Clinical Intake:  Pre-visit preparation completed: Yes  Pain : No/denies pain     BMI - recorded: 33.83 Nutritional Status: BMI > 30  Obese Nutritional Risks: None Diabetes: No  How often do you need to have someone help you when you read instructions, pamphlets, or other written materials from your doctor or pharmacy?: 1 - Never  Interpreter Needed?: No  Information entered by :: Clemetine Marker LPN  Past Medical History:  Diagnosis Date  . Allergy   . Arthritis    knees, lower back  . Breast cancer ([facility]) right   2011, T1A N0, radiation  . Esophageal reflux   . Hodgkin's disease ([place]) 2004  . Hypertension 2006  . Osteoporosis   . Personal history of malignant neoplasm of breast 2011   right breast  . Personal history of radiation therapy 2011   RIGHT lumpectomy w/ radiation   Past Surgical History:  Procedure Laterality Date  . ABDOMINAL HYSTERECTOMY    . BACK SURGERY    . BREAST BIOPSY Right 2011   radiation  . BREAST LUMPECTOMY Right 2011   w/ radiation  . BREAST SURGERY Right 2011   right breast lumpectomy,L/SN/R for right breast CA   . CATARACT EXTRACTION W/PHACO Left 06/10/2018   Procedure: CATARACT EXTRACTION PHACO AND INTRAOCULAR LENS PLACEMENT ([place]) LEFT;  Surgeon: Eulogio Bear, MD;  Location: [facility];  Service: Ophthalmology;  Laterality: Left;  Diabetic - oral meds  . CATARACT EXTRACTION W/PHACO Right 07/15/2018   Procedure:  CATARACT EXTRACTION PHACO AND INTRAOCULAR LENS PLACEMENT ([facility]) RIGHT;  Surgeon: Eulogio Bear, MD;  Location: [facility];  Service: Ophthalmology;  Laterality: Right;  . COLONOSCOPY  2003  . PORT A CATH REVISION    . REPLACEMENT TOTAL KNEE Right 10/03/2019   DR. Harlow Mares   . WRIST SURGERY  2005   Family History  Problem Relation Age of Onset  . Cancer Father   . Bone cancer Father   . Breast cancer Mother 78  . Stroke Mother   . Colon cancer Maternal Grandmother   . Breast cancer Cousin     Social History   Socioeconomic History  . Marital status: Married    Spouse name: [name]  . Number of children: 2  . Years of education: some college  . Highest education level: 12th grade  Occupational History  . Occupation: Retired  Tobacco Use  . Smoking status: Never Smoker  . Smokeless tobacco: Never Used  . Tobacco comment: smoking cessation materials not required  Substance and Sexual Activity  . Alcohol use: Yes    Alcohol/week: 2.0 standard drinks    Types: 2 Glasses of wine per week    Comment: occassionally  . Drug use: No  . Sexual activity: Not Currently  Other Topics Concern  . Not on file  Social History Narrative  . Not on file   Social Determinants of Health   Financial Resource Strain: Low Risk   . Difficulty of Paying Living Expenses: Not hard at all  Food Insecurity: No Food Insecurity  . Worried About Charity fundraiser in the Last Year: Never true  . Ran Out of Food in the Last Year: Never true  Transportation Needs: No Transportation Needs  . Lack of Transportation (Medical): No  . Lack of Transportation (Non-Medical): No  Physical Activity: Insufficiently Active  . Days of Exercise per Week: 2 days  . Minutes of Exercise per Session: 60 min  Stress: No Stress Concern Present  . Feeling of Stress : Not at all  Social Connections: Slightly Isolated  . Frequency of Communication with Friends and Family: More than three times a week  . Frequency of Social Gatherings with Friends and Family: More than three times a week  . Attends Religious Services: More than 4 times per year  . Active Member of Clubs or Organizations: No  . Attends Archivist Meetings: Never  . Marital Status: Married    Outpatient Encounter Medications as of 01/01/2020  Medication Sig  . acetaminophen (TYLENOL) 650 MG CR tablet Take 1,300 mg by mouth every 8 (eight) hours as needed for pain.  Marland Kitchen amLODipine-valsartan (EXFORGE) 5-160 MG tablet Take 1 tablet by  mouth daily.  Marland Kitchen anastrozole (ARIMIDEX) 1 MG tablet Take 1 tablet by mouth once daily  . aspirin 81 MG tablet Take 81 mg by mouth daily.  Marland Kitchen CALCIUM-MAGNESIUM PO Take 1 tablet by mouth daily.  . cetirizine (ZYRTEC) 10 MG tablet Take 10 mg by mouth daily.  . Cholecalciferol (VITAMIN D-3 PO) Take 1 tablet by mouth daily.  Marland Kitchen denosumab (PROLIA) 60 MG/ML SOSY injection Inject 60 mg into the skin every 6 (six) months.  . meloxicam (MOBIC) 15 MG tablet Take 1 tablet (15 mg total) by mouth daily.  . metoprolol succinate (TOPROL-XL) 50 MG 24 hr tablet Take 1 tablet (50 mg total) by mouth daily. Take with or immediately following a meal.  . Multiple Vitamin (MULTIVITAMIN) tablet Take 1 tablet by mouth daily.  Marland Kitchen  nystatin (MYCOSTATIN/NYSTOP) powder Apply topically 2 (two) times daily.  . pantoprazole (PROTONIX) 40 MG tablet Take 1 tablet (40 mg total) by mouth daily.  Marland Kitchen tolterodine (DETROL LA) 4 MG 24 hr capsule Take 4 mg by mouth daily.  . [DISCONTINUED] SENNOSIDES-DOCUSATE SODIUM PO 2 EACH PO QAM   No facility-administered encounter medications on file as of 01/01/2020.    Activities of Daily Living In your present state of health, do you have any difficulty performing the following activities: 01/01/2020 11/03/2019  Hearing? N N  Comment declines hearing aids -  Vision? N N  Difficulty concentrating or making decisions? N N  Walking or climbing stairs? Y Y  Comment - -  Dressing or bathing? N N  Doing errands, shopping? N Walls and eating ? N -  Using the Toilet? N -  In the past six months, have you accidently leaked urine? Y -  Comment occasionally wears pads for protection -  Do you have problems with loss of bowel control? N -  Managing your Medications? N -  Managing your Finances? N -  Housekeeping or managing your Housekeeping? N -  Comment - -  Some recent data might be hidden    Patient Care Team: Steele Sizer, MD as PCP - General (Family  Medicine) Lequita Asal, MD as Referring Physician (Hematology and Oncology) Solum, Betsey Holiday, MD as Consulting Physician (Endocrinology) Thornton Park, MD as Consulting Physician (Orthopedic Surgery) Bjorn Loser, MD as Consulting Physician (Urology) Neldon Labella, RN as Case Manager    Assessment:   This is a routine wellness examination for Cinde.  Exercise Activities and Dietary recommendations Current Exercise Habits: Structured exercise class, Type of exercise: Other - see comments(water aerobics), Time (Minutes): 60, Frequency (Times/Week): 2, Weekly Exercise (Minutes/Week): 120, Intensity: Moderate, Exercise limited by: orthopedic condition(s)  Goals    . DIET - INCREASE WATER INTAKE     Recommend to drink at least 6-8 8oz glasses of water per day.    . Improve Ability to Self-Manage Chronic Illnesses     Current Barriers:  . Chronic Disease Management support related to Hypertension and Osteoarthritis. Required right knee replacement on 10/03/19.  Case Manager Clinical Goal(s):  Marland Kitchen Over the next 90 days, patient will not require hospital admission related to chronic illnesses or surgical complications. . Over the next 90 days, patient will continue to take all medications as prescribed. . Over the next 90 days, patient will attend all scheduled provider appointments and surgical follow-up visits. . Over the next 90 days, patient will complete physical therapy sessions as recommended. . Over the next 90 days, patient will monitor her blood pressure and record readings. . Over the next 90 days, patient will follow recommended safety and fall prevention measures.  Interventions:  . Evaluation of current treatment plan related to hypertension self management and patient's adherence to plan as established by provider. . Reviewed medications with patient and discussed importance of compliance .  Marland Kitchen Discussed plans with patient for ongoing care management follow up and  provided patient with direct contact information for care management team . Advised patient, providing education and rationale, to monitor blood pressure daily and record, calling PCP for findings outside established parameters.  .  .  . Reviewed scheduled/upcoming provider appointments including:    Patient Self Care Activities:  . Self administers medications as prescribed . Attends all scheduled provider appointments . Calls pharmacy for medication refills . Performs ADL's independently  Initial goal documentation     . Increase physical activity     Pt would like to start water aerobics again twice weekly and increase physical activity overall        Fall Risk Fall Risk  01/01/2020 11/03/2019 10/15/2019 09/17/2019 07/28/2019  Falls in the past year? 0 0 0 0 0  Number falls in past yr: 0 0 - 0 0  Injury with Fall? 0 0 - 0 0  Risk for fall due to : Orthopedic patient - Impaired balance/gait;Orthopedic patient - -  Follow up Falls prevention discussed - Falls prevention discussed - -   FALL RISK PREVENTION PERTAINING TO THE HOME:  Any stairs in or around the home? Yes  If so, do they handrails? Yes   Home free of loose throw rugs in walkways, pet beds, electrical cords, etc? Yes  Adequate lighting in your home to reduce risk of falls? Yes   ASSISTIVE DEVICES UTILIZED TO PREVENT FALLS:  Life alert? No  Use of a cane, walker or w/c? No  Grab bars in the bathroom? No  Shower chair or bench in shower? No  Elevated toilet seat or a handicapped toilet? Yes   DME ORDERS:  DME order needed?  No   TIMED UP AND GO:  Was the test performed? No . Telephonic visit.   Education: Fall risk prevention has been discussed.  Intervention(s) required? No   Depression Screen PHQ 2/9 Scores 01/01/2020 11/03/2019 10/15/2019 09/17/2019  PHQ - 2 Score 0 0 0 0  PHQ- 9 Score - 0 - 0     Cognitive Function     6CIT Screen 01/01/2020 12/27/2018 12/25/2017  What Year? 0 points 0 points 0  points  What month? 0 points 0 points 0 points  What time? 0 points 0 points 0 points  Count back from 20 0 points 0 points 0 points  Months in reverse 0 points 2 points 0 points  Repeat phrase 0 points 0 points 0 points  Total Score 0 2 0    Immunization History  Administered Date(s) Administered  . Fluad Quad(high Dose 65+) 08/11/2019  . Influenza, High Dose Seasonal PF 09/20/2015, 07/17/2016, 07/03/2017, 07/03/2018  . Influenza-Unspecified 07/30/2013  . Moderna SARS-COVID-2 Vaccination 12/05/2019  . Pneumococcal Conjugate-13 05/04/2016  . Pneumococcal Polysaccharide-23 08/27/2007, 06/17/2013  . Tdap 09/16/2010  . Zoster 02/28/2011  . Zoster Recombinat (Shingrix) 05/30/2018, 07/29/2018    Qualifies for Shingles Vaccine? Yes  Shingrix series completed.   Tdap: Up to date  Flu Vaccine: Up to date  Pneumococcal Vaccine: Up to date   Screening Tests Health Maintenance  Topic Date Due  . MAMMOGRAM  07/08/2020  . TETANUS/TDAP  09/16/2020  . COLONOSCOPY  10/30/2021  . INFLUENZA VACCINE  Completed  . DEXA SCAN  Completed  . Hepatitis C Screening  Completed  . PNA vac Low Risk Adult  Completed    Cancer Screenings:  Colorectal Screening: Completed 06/26/12. Repeat every 10 years;  Mammogram: Completed 07/09/19. Repeat every year.  Bone Density: Completed 05/05/19. Results reflect  OSTEOPENIA. Repeat every 2 years.   Lung Cancer Screening: (Low Dose CT Chest recommended if Age 43-80 years, 30 pack-year currently smoking OR have quit w/in 15years.) does not qualify.   Additional Screening:  Hepatitis C Screening: does qualify; Completed 03/17/16  Vision Screening: Recommended annual ophthalmology exams for early detection of glaucoma and other disorders of the eye. Is the patient up to date with their annual eye exam?  Yes  Who is  the provider or what is the name of the office in which the pt attends annual eye exams? [facility] Screening: Recommended  annual dental exams for proper oral hygiene  Community Resource Referral:  CRR required this visit?  No       Plan:     I have personally reviewed and addressed the Medicare Annual Wellness questionnaire and have noted the following in the patient's chart:  A. Medical and social history B. Use of alcohol, tobacco or illicit drugs  C. Current medications and supplements D. Functional ability and status E.  Nutritional status F.  Physical activity G. Advance directives H. List of other physicians I.  Hospitalizations, surgeries, and ER visits in previous 12 months J.  [place] such as hearing and vision if needed, cognitive and depression L. Referrals and appointments   In addition, I have reviewed and discussed with patient certain preventive protocols, quality metrics, and best practice recommendations. A written personalized care plan for preventive services as well as general preventive health recommendations were provided to patient.   Signed,  Clemetine Marker, LPN Nurse Health Advisor   Nurse Notes: pt scheduled for second covid vaccine 01/02/20.

## 2020-01-09 ENCOUNTER — Telehealth: Payer: Self-pay

## 2020-01-12 ENCOUNTER — Other Ambulatory Visit: Payer: Self-pay

## 2020-01-12 ENCOUNTER — Ambulatory Visit (INDEPENDENT_AMBULATORY_CARE_PROVIDER_SITE_OTHER): Payer: Medicare Other | Admitting: Urology

## 2020-01-12 DIAGNOSIS — R350 Frequency of micturition: Secondary | ICD-10-CM

## 2020-01-12 NOTE — Progress Notes (Signed)
PTNS  Session # One  Health & Social Factors: No change Caffeine: 2 Alcohol: 0-1 Daytime voids #per day: 3-4 Night-time voids #per night: 4 Urgency: mild Incontinence Episodes #per day: 0 Ankle used: Left  Treatment Setting: 1 Feeling/ Response: Both Comments: Patient tolerated the procedure well.   Performed By: Zara Council, PA-C   Assistant: Kyra Manges, CMA   Follow Up: One week

## 2020-01-14 ENCOUNTER — Other Ambulatory Visit: Payer: Self-pay | Admitting: Hematology and Oncology

## 2020-01-14 DIAGNOSIS — Z853 Personal history of malignant neoplasm of breast: Secondary | ICD-10-CM

## 2020-01-19 ENCOUNTER — Ambulatory Visit (INDEPENDENT_AMBULATORY_CARE_PROVIDER_SITE_OTHER): Payer: Medicare Other | Admitting: Urology

## 2020-01-19 ENCOUNTER — Other Ambulatory Visit: Payer: Self-pay

## 2020-01-19 DIAGNOSIS — R350 Frequency of micturition: Secondary | ICD-10-CM | POA: Diagnosis not present

## 2020-01-19 NOTE — Progress Notes (Signed)
PTNS  Session # 2  Health & Social Factors: no change Caffeine: 2 Alcohol: 0-1 Daytime voids #per day: 5-6 Night-time voids #per night: 4 Urgency: mild Incontinence Episodes #per day: 0 Ankle used: left Treatment Setting: 4 Feeling/ Response: both Comments: patient tolerated well  Preformed By: Fonnie Jarvis, CMA   Follow Up: 1 wk

## 2020-01-23 ENCOUNTER — Ambulatory Visit: Payer: Medicare Other

## 2020-01-25 NOTE — Chronic Care Management (AMB) (Signed)
  Chronic Care Management   Note   Name: Mary Todd MRN: [id number] DOB: 04/01/1946    Attempted outreach with [name]. She reports doing well but was in a public area at the time of the call. Agreeable to telephonic outreach within the next two weeks.   Follow up plan: The care management team will follow up with [name] within the next two weeks.   [facility] [phone]

## 2020-01-26 ENCOUNTER — Other Ambulatory Visit: Payer: Self-pay

## 2020-01-26 ENCOUNTER — Ambulatory Visit (INDEPENDENT_AMBULATORY_CARE_PROVIDER_SITE_OTHER): Payer: Medicare Other | Admitting: Urology

## 2020-01-26 DIAGNOSIS — R350 Frequency of micturition: Secondary | ICD-10-CM

## 2020-01-26 NOTE — Progress Notes (Signed)
PTNS  Session # 3  Health & Social Factors: same Caffeine: 2 Alcohol: 0-1 Daytime voids #per day: 4-5 Night-time voids #per night: 3-4 Urgency: mild Incontinence Episodes #per day: 0-1 Ankle used: left Treatment Setting: 1 Feeling/ Response: both Comments: patient tolerated well  Preformed By: Fonnie Jarvis, CMA   Follow Up: 1wk  \

## 2020-02-02 ENCOUNTER — Other Ambulatory Visit: Payer: Self-pay

## 2020-02-02 ENCOUNTER — Ambulatory Visit (INDEPENDENT_AMBULATORY_CARE_PROVIDER_SITE_OTHER): Payer: Medicare Other | Admitting: Urology

## 2020-02-02 DIAGNOSIS — R350 Frequency of micturition: Secondary | ICD-10-CM

## 2020-02-02 DIAGNOSIS — N3946 Mixed incontinence: Secondary | ICD-10-CM

## 2020-02-02 NOTE — Progress Notes (Signed)
PTNS  Session # 4  Health & Social Factors: no change Caffeine: 2 Alcohol: 0-2 Daytime voids #per day: 4 Night-time voids #per night: 2-3 Urgency: mild Incontinence Episodes #per day: 0 Ankle used: left Treatment Setting: 2 Feeling/ Response: sensory Comments: Patient tolerated well  Preformed By: Elberta Leatherwood, CMA  Follow Up: 1 week #5

## 2020-02-04 ENCOUNTER — Telehealth: Payer: Self-pay

## 2020-02-09 ENCOUNTER — Other Ambulatory Visit: Payer: Self-pay

## 2020-02-09 ENCOUNTER — Ambulatory Visit (INDEPENDENT_AMBULATORY_CARE_PROVIDER_SITE_OTHER): Payer: Medicare Other | Admitting: Urology

## 2020-02-09 DIAGNOSIS — R350 Frequency of micturition: Secondary | ICD-10-CM

## 2020-02-09 IMAGING — US US EXTREM LOW VENOUS*R*
1 series · 13 of 24 positions shown · non-contrast
Comparison: None.

CLINICAL DATA: Right lower extremity pain and swelling



[Series 1: us extrem low venous*right* · 0.08mm/px · 13 of 31 slices shown]
[im 1/31]
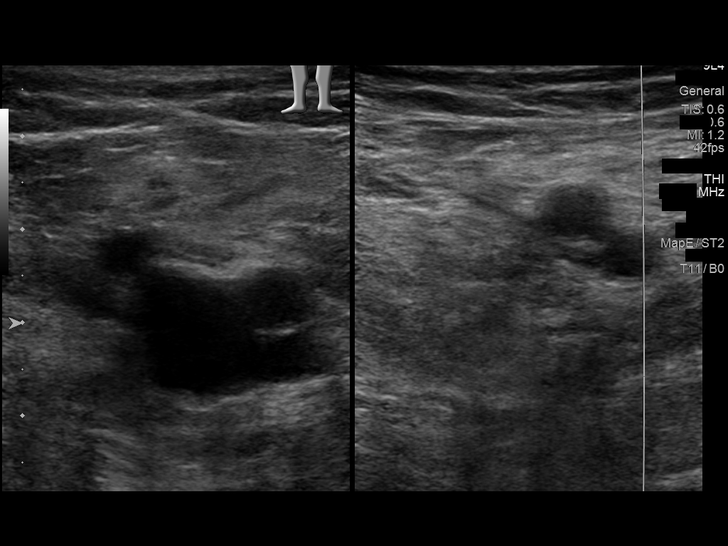
[im 3/31]
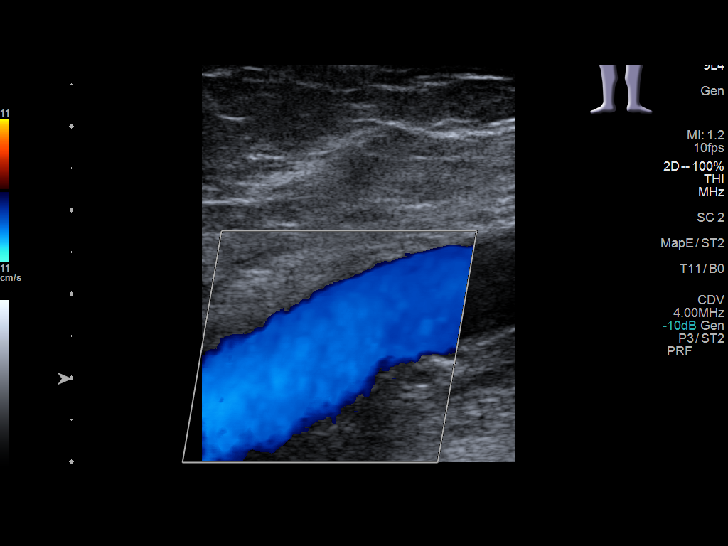
[im 6/31]
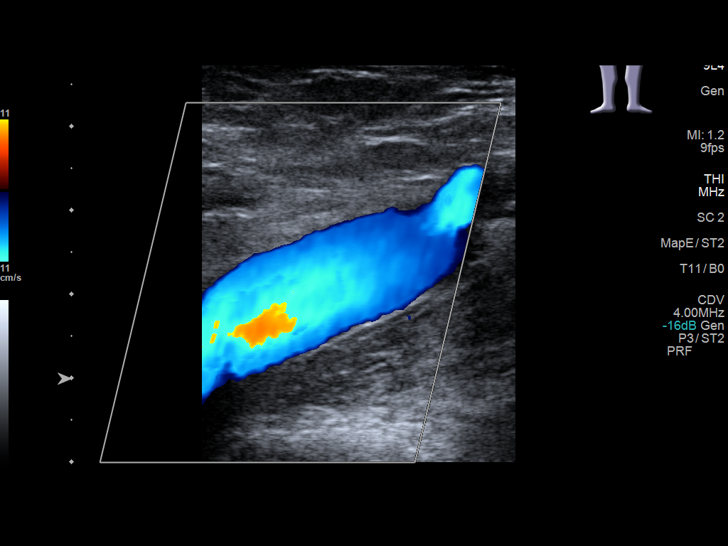
[im 8/31]
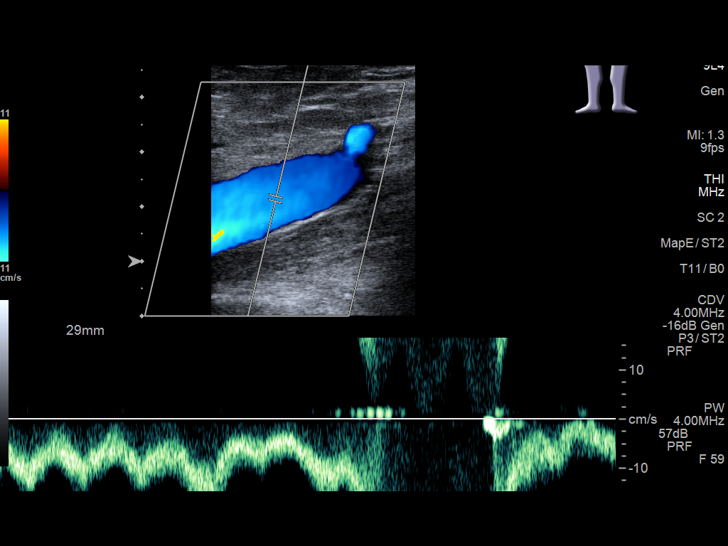
[im 11/31]
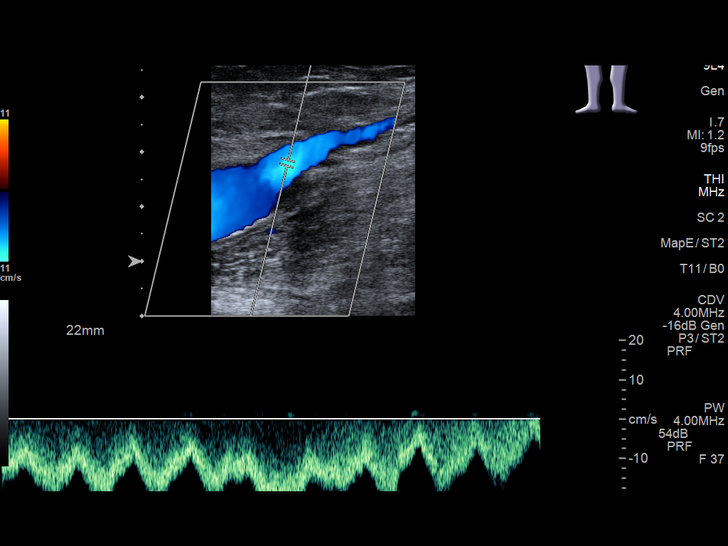
[im 14/31]
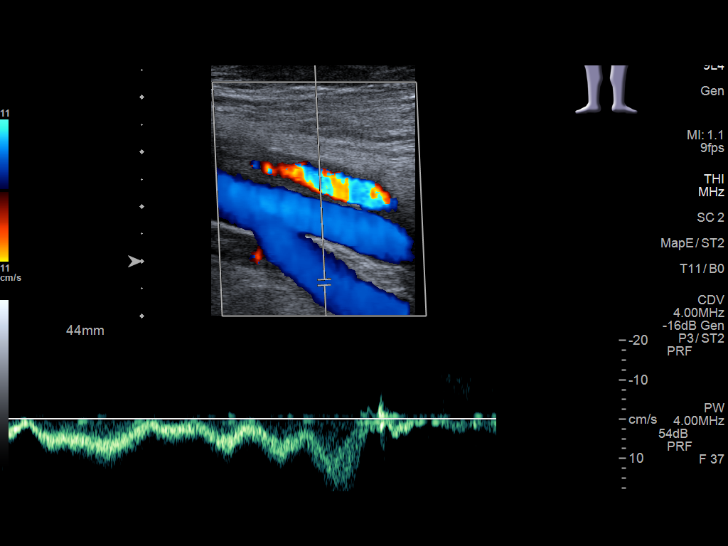
[im 16/31]
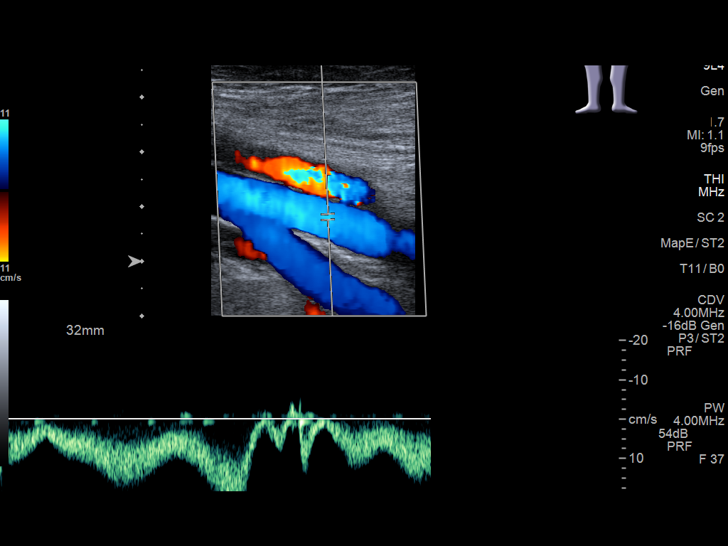
[im 17/31]
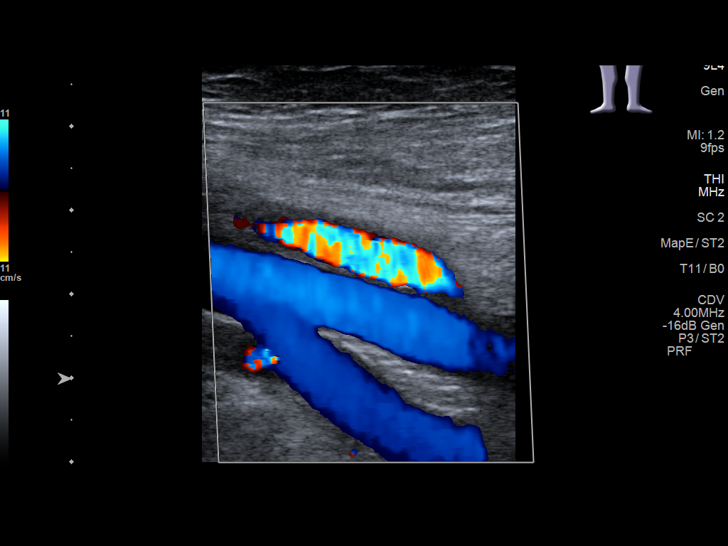
[im 20/31]
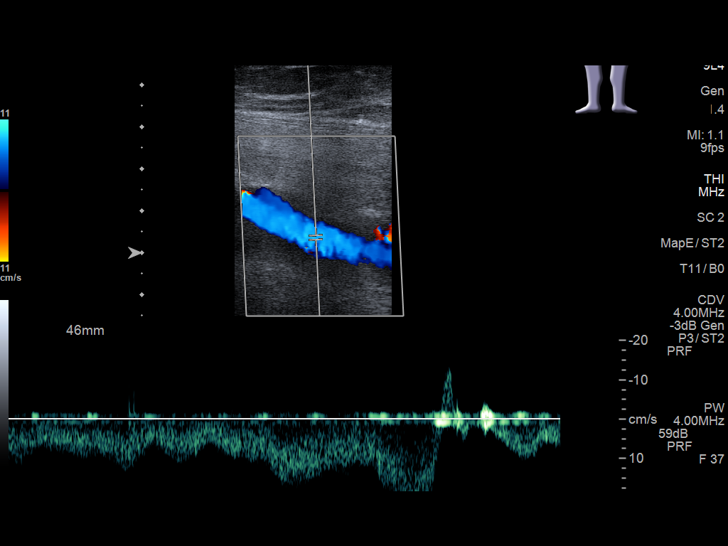
[im 23/31]
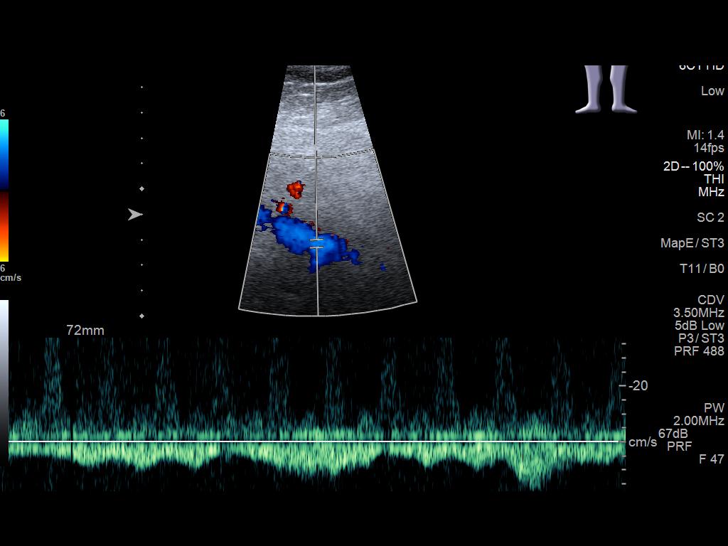
[im 25/31]
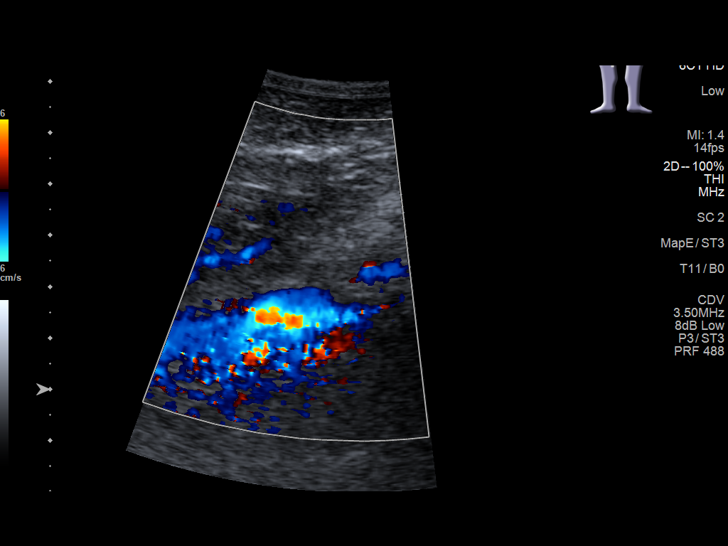
[im 28/31]
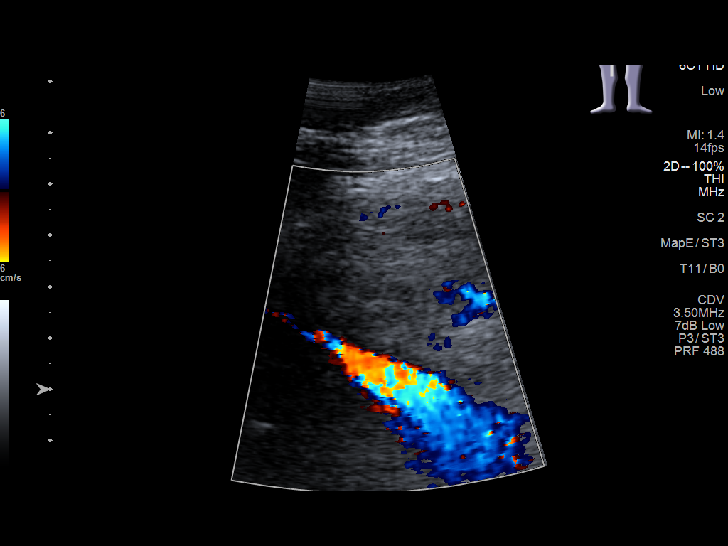
[im 31/31]
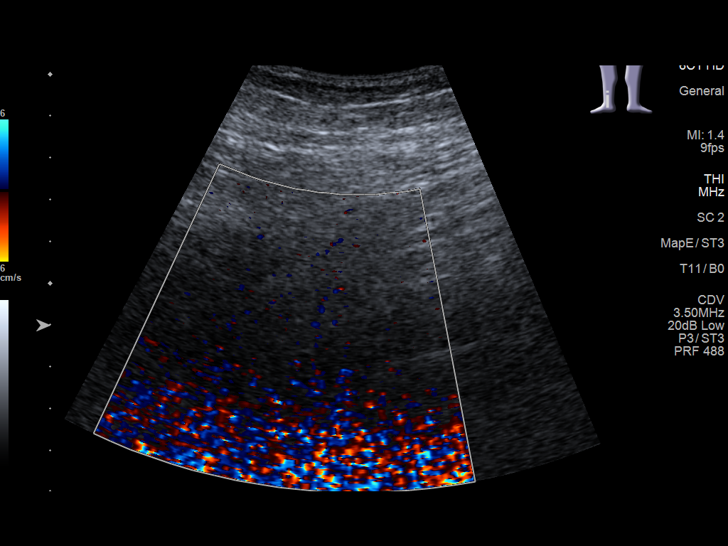

[13 of 24 positions shown; findings below may reference images not displayed]

FINDINGS: Contralateral Common Femoral Vein: Respiratory phasicity is normal
and symmetric with the symptomatic side. No evidence of thrombus.
Normal compressibility.

Common Femoral Vein: No evidence of thrombus. Normal
compressibility, respiratory phasicity and response to augmentation.

Saphenofemoral Junction: No evidence of thrombus. Normal
compressibility and flow on color Doppler imaging.

Profunda Femoral Vein: No evidence of thrombus. Normal
compressibility and flow on color Doppler imaging.

Femoral Vein: No evidence of thrombus. Normal compressibility,
respiratory phasicity and response to augmentation.

Popliteal Vein: No evidence of thrombus. Normal compressibility,
respiratory phasicity and response to augmentation.

Calf Veins: Unable to visualize secondary to body habitus and
peripheral edema.
IMPRESSION: Limited exam. No significant right lower extremity femoropopliteal
DVT. Calf veins were not visualized.

## 2020-02-09 NOTE — Progress Notes (Signed)
PTNS  Session # 5  Health & Social Factors: no change Caffeine: 1 Alcohol: 1 Daytime voids #per day: 4 Night-time voids #per night: 2 Urgency: mild Incontinence Episodes #per day: 0 Ankle used: left Treatment Setting: 2 Feeling/ Response: sensory Comments: patient tolerated well  Preformed By: Elberta Leatherwood, CMA  Follow Up: 1 week #6

## 2020-02-16 ENCOUNTER — Other Ambulatory Visit: Payer: Self-pay

## 2020-02-16 ENCOUNTER — Ambulatory Visit (INDEPENDENT_AMBULATORY_CARE_PROVIDER_SITE_OTHER): Payer: Medicare Other | Admitting: Urology

## 2020-02-16 DIAGNOSIS — R350 Frequency of micturition: Secondary | ICD-10-CM

## 2020-02-16 NOTE — Progress Notes (Signed)
PTNS  Session # 6  Health & Social Factors: no change Caffeine: 2 Alcohol: 0 Daytime voids #per day: 5 Night-time voids #per night: 2 Urgency: none Incontinence Episodes #per day: 0 Ankle used: left Treatment Setting: 7 Feeling/ Response: both Comments: Patient tolerated well  Preformed By: Elberta Leatherwood, CMA  Follow Up: 1 week #7

## 2020-02-17 DIAGNOSIS — H35051 Retinal neovascularization, unspecified, right eye: Secondary | ICD-10-CM | POA: Diagnosis not present

## 2020-02-23 ENCOUNTER — Encounter: Payer: Self-pay | Admitting: Urology

## 2020-02-23 ENCOUNTER — Ambulatory Visit (INDEPENDENT_AMBULATORY_CARE_PROVIDER_SITE_OTHER): Payer: Medicare Other | Admitting: Urology

## 2020-02-23 ENCOUNTER — Other Ambulatory Visit: Payer: Self-pay

## 2020-02-23 ENCOUNTER — Ambulatory Visit: Payer: Medicare Other | Admitting: Urology

## 2020-02-23 DIAGNOSIS — N3946 Mixed incontinence: Secondary | ICD-10-CM

## 2020-02-23 DIAGNOSIS — R350 Frequency of micturition: Secondary | ICD-10-CM

## 2020-02-23 NOTE — Progress Notes (Signed)
PTNS  Session # 7  Health & Social Factors: no change Caffeine: 1 Alcohol: 0 Daytime voids #per day: 6 Night-time voids #per night: 2 Urgency: mild Incontinence Episodes #per day: 0 Ankle used: left Treatment Setting: 5 Feeling/ Response: both Comments: Patient tolerated well  Preformed By: Elberta Leatherwood, CMA  Follow Up: 1 week #8

## 2020-02-23 NOTE — Progress Notes (Signed)
PTNS  Session # 7  Health & Social Factors: No change Caffeine: 1 Alcohol: 0 Daytime voids #per day: 6 Night-time voids #per night: 2 Urgency: mild Incontinence Episodes #per day: 0 Ankle used: Left Treatment Setting: 5 Feeling/ Response: Both Comments: Patient tolerated the procedure.    Performed By: Kyra Manges, CMA  Follow Up: In one week for # 8 for PTNS

## 2020-02-24 ENCOUNTER — Ambulatory Visit: Payer: Medicare Other | Admitting: Urology

## 2020-03-01 ENCOUNTER — Ambulatory Visit (INDEPENDENT_AMBULATORY_CARE_PROVIDER_SITE_OTHER): Payer: Medicare Other | Admitting: Urology

## 2020-03-01 ENCOUNTER — Other Ambulatory Visit: Payer: Self-pay

## 2020-03-01 DIAGNOSIS — R350 Frequency of micturition: Secondary | ICD-10-CM

## 2020-03-01 NOTE — Progress Notes (Signed)
PTNS  Session # 8  Health & Social Factors: no change Caffeine: 1 Alcohol: 0 Daytime voids #per day: 5-6 Night-time voids #per night: 2 Urgency: mild Incontinence Episodes #per day: 0 Ankle used: left Treatment Setting: 2 Feeling/ Response: Both Comments: Patient tolerated well  Preformed By: Elberta Leatherwood, CMA  Follow Up:  1 week #9

## 2020-03-02 ENCOUNTER — Encounter: Payer: Self-pay | Admitting: Family Medicine

## 2020-03-02 ENCOUNTER — Other Ambulatory Visit: Payer: Self-pay

## 2020-03-02 ENCOUNTER — Ambulatory Visit (INDEPENDENT_AMBULATORY_CARE_PROVIDER_SITE_OTHER): Payer: Medicare Other | Admitting: Family Medicine

## 2020-03-02 VITALS — BP 120/80 | HR 90 | Temp 97.1°F | Resp 16 | Ht 63.0 in | Wt 218.5 lb

## 2020-03-02 DIAGNOSIS — R739 Hyperglycemia, unspecified: Secondary | ICD-10-CM

## 2020-03-02 DIAGNOSIS — I10 Essential (primary) hypertension: Secondary | ICD-10-CM | POA: Diagnosis not present

## 2020-03-02 DIAGNOSIS — M810 Age-related osteoporosis without current pathological fracture: Secondary | ICD-10-CM | POA: Diagnosis not present

## 2020-03-02 DIAGNOSIS — F341 Dysthymic disorder: Secondary | ICD-10-CM | POA: Diagnosis not present

## 2020-03-02 DIAGNOSIS — L304 Erythema intertrigo: Secondary | ICD-10-CM | POA: Diagnosis not present

## 2020-03-02 DIAGNOSIS — K219 Gastro-esophageal reflux disease without esophagitis: Secondary | ICD-10-CM

## 2020-03-02 DIAGNOSIS — R1900 Intra-abdominal and pelvic swelling, mass and lump, unspecified site: Secondary | ICD-10-CM | POA: Diagnosis not present

## 2020-03-02 DIAGNOSIS — R7303 Prediabetes: Secondary | ICD-10-CM | POA: Diagnosis not present

## 2020-03-02 DIAGNOSIS — C50911 Malignant neoplasm of unspecified site of right female breast: Secondary | ICD-10-CM

## 2020-03-02 DIAGNOSIS — C8190 Hodgkin lymphoma, unspecified, unspecified site: Secondary | ICD-10-CM | POA: Diagnosis not present

## 2020-03-02 DIAGNOSIS — M170 Bilateral primary osteoarthritis of knee: Secondary | ICD-10-CM

## 2020-03-02 MED ORDER — MELOXICAM 15 MG PO TABS: 15.0000 mg | ORAL_TABLET | Freq: Every day | ORAL | 1 refills | Status: DC

## 2020-03-02 MED ORDER — METOPROLOL SUCCINATE ER 50 MG PO TB24: 50.0000 mg | ORAL_TABLET | Freq: Every day | ORAL | 1 refills | Status: DC

## 2020-03-02 MED ORDER — CITALOPRAM HYDROBROMIDE 20 MG PO TABS: 20.0000 mg | ORAL_TABLET | Freq: Every day | ORAL | 0 refills | Status: DC

## 2020-03-02 MED ORDER — PANTOPRAZOLE SODIUM 40 MG PO TBEC: 40.0000 mg | DELAYED_RELEASE_TABLET | Freq: Every day | ORAL | 1 refills | Status: DC

## 2020-03-02 NOTE — Progress Notes (Signed)
Name: Mary Todd   MRN: [id number]    DOB: June 27, 1946   Date:03/02/2020       Progress Note  Subjective  Chief Complaint  Chief Complaint  Patient presents with  . Hypertension  . Gastroesophageal Reflux  . Mass    found on left side of back  . Excessive Sweating    under breast and groin, itching    HPI  Pre-diabetes: she denies polyphagia, polydipsia or polyuria, she has been off  Metformin since recall in 2020 , last A1C was 5.6%   Urge incontinence:. [facility] [facility] this past Summer  2020 and was given Myrbetriq , but it did not decrease nighttime symptoms, switched to Ditropan and after that was on Detrol. She is still on medication but started the nerve stimulation ( post tibial neuro stimulation needle electrode) March 15 th, 2020. She states she is already feeling better, nocturia down from 4 times to 2 per night   HTN: she is doing well on Exforge 5/160 and also metoprolol, tolerating medication well, no chest pain or palpitation, bp is at goal in our office, also states at home between 120-130's/80-90's    GERD:she tried stopping Pantoprazole but as soon as she went off medication symptoms became severe, with heartburn  and regurgitation, andhas been taking it daily again.She states she has some regurgitation at night and takes Rollaids prn. Discussed raising the head of the bed and avoid eating within 3 hours prior to going to sleep   OA: she had TKR right knee on Dec 4 th 2020 by Dr. Harlow Mares , sje completely PT and is doing well, she denies any pan, but has some stiffness.   RightBreast Cancer/hodgkins lymphoma : under the care of Dr. Mike Gip she has been released from her surgeon. . Also diagnosed with hodgkin's lymphoma in 2004, breast cancer diagnosed in 2011still on Arimidex she is almost at the 10 year mark   Osteoporosis: she is on Prolia,  found on bone density of spine, she is tolerating medications well,Last Prolia infusion was 12/26/2019 at  Oncologist office   Thyroid nodules: incidental finding on CT neck , had biopsy by Dr. Gabriel Carina Dec 2017 and biopsy was negative, still has yearly follow ups with her. Lat TSH October 2020 was normal at 0.60 . No dysphagia   Obesity: she has long history of obesity, she isoffMetformin,she has gained over 10 lbs in the past year. Discussed portion control, healthier food options at home, she will try to increase physical activity   Dysthymia: she has noticed a lack of motivation, she just sits around all day, she has been watching her great niece and nephew twice a week, they are toddlers and it makes her tired but she likes being able to care for them. She states she is even missed her water aerobics yesterday because of the effort to get ready to go. She is starting to substitute teach more often. She states her father died Jun 05, 2019, she was the primary caregiver for one year. She noticed  since she died she started to noticed her mood going down. In the middle of the pandemic   Patient Active Problem List   Diagnosis Date Noted  . Total knee replacement status, right 10/03/2019  . Encounter for screening mammogram for breast cancer 07/02/2018  . Anterolisthesis 08/06/2017  . DDD (degenerative disc disease), lumbosacral 08/06/2017  . Chondromalacia patellae 07/03/2017  . Osteoporosis 12/14/2016  . Morbid obesity ([place]) 07/17/2016  . Spinal stenosis in cervical region  05/22/2016  . Thyroid nodule 05/22/2016  . GERD without esophagitis 03/16/2016  . Neck muscle spasm 03/16/2016  . History of back surgery 03/16/2016  . Osteoarthritis of both knees 03/16/2016  . Large breasts 03/16/2016  . Hypertension, benign 03/16/2016  . Perennial allergic rhinitis with seasonal variation 03/16/2016  . History of pneumococcal pneumonia 09/20/2015  . Hodgkin disease ([place]) 07/15/2014  . Breast cancer, right ([place]) 06/26/2013    Past Surgical History:  Procedure Laterality Date  . ABDOMINAL  HYSTERECTOMY    . BACK SURGERY    . BREAST BIOPSY Right 2011   radiation  . BREAST LUMPECTOMY Right 2011   w/ radiation  . BREAST SURGERY Right 2011   right breast lumpectomy,L/SN/R for right breast CA   . CATARACT EXTRACTION W/PHACO Left 06/10/2018   Procedure: CATARACT EXTRACTION PHACO AND INTRAOCULAR LENS PLACEMENT ([place]) LEFT;  Surgeon: Eulogio Bear, MD;  Location: [facility];  Service: Ophthalmology;  Laterality: Left;  Diabetic - oral meds  . CATARACT EXTRACTION W/PHACO Right 07/15/2018   Procedure: CATARACT EXTRACTION PHACO AND INTRAOCULAR LENS PLACEMENT ([place]) RIGHT;  Surgeon: Eulogio Bear, MD;  Location: [facility];  Service: Ophthalmology;  Laterality: Right;  . COLONOSCOPY  2003  . PORT A CATH REVISION    . REPLACEMENT TOTAL KNEE Right 10/03/2019   DR. Harlow Mares   . WRIST SURGERY  2005    Family History  Problem Relation Age of Onset  . Cancer Father   . Bone cancer Father   . Breast cancer Mother 74  . Stroke Mother   . Colon cancer Maternal Grandmother   . Breast cancer Cousin     Social History   Tobacco Use  . Smoking status: Never Smoker  . Smokeless tobacco: Never Used  . Tobacco comment: smoking cessation materials not required  Substance Use Topics  . Alcohol use: Yes    Alcohol/week: 2.0 standard drinks    Types: 2 Glasses of wine per week    Comment: occassionally     Current Outpatient Medications:  .  acetaminophen (TYLENOL) 650 MG CR tablet, Take 1,300 mg by mouth every 8 (eight) hours as needed for pain., Disp: , Rfl:  .  amLODipine-valsartan (EXFORGE) 5-160 MG tablet, Take 1 tablet by mouth daily., Disp: 90 tablet, Rfl: 1 .  anastrozole (ARIMIDEX) 1 MG tablet, Take 1 tablet by mouth once daily, Disp: 30 tablet, Rfl: 3 .  aspirin 81 MG tablet, Take 81 mg by mouth daily., Disp: , Rfl:  .  CALCIUM-MAGNESIUM PO, Take 1 tablet by mouth daily., Disp: , Rfl:  .  cetirizine (ZYRTEC) 10 MG tablet, Take 10 mg by mouth daily.,  Disp: , Rfl:  .  Cholecalciferol (VITAMIN D-3 PO), Take 1 tablet by mouth daily., Disp: , Rfl:  .  denosumab (PROLIA) 60 MG/ML SOSY injection, Inject 60 mg into the skin every 6 (six) months., Disp: , Rfl:  .  meloxicam (MOBIC) 15 MG tablet, Take 1 tablet (15 mg total) by mouth daily., Disp: 90 tablet, Rfl: 1 .  metoprolol succinate (TOPROL-XL) 50 MG 24 hr tablet, Take 1 tablet (50 mg total) by mouth daily. Take with or immediately following a meal., Disp: 90 tablet, Rfl: 1 .  Multiple Vitamin (MULTIVITAMIN) tablet, Take 1 tablet by mouth daily., Disp: , Rfl:  .  nystatin (MYCOSTATIN/NYSTOP) powder, Apply topically 2 (two) times daily., Disp: , Rfl:  .  pantoprazole (PROTONIX) 40 MG tablet, Take 1 tablet (40 mg total) by mouth daily., Disp:  90 tablet, Rfl: 1 .  tolterodine (DETROL LA) 4 MG 24 hr capsule, Take 4 mg by mouth daily., Disp: , Rfl:   Allergies  Allergen Reactions  . Shrimp [Shellfish Allergy] Hives  . Augmentin [Amoxicillin-Pot Clavulanate] Nausea Only    I personally reviewed active problem list, medication list, allergies, family history, social history, health maintenance with the patient/caregiver today.   ROS  Constitutional: Negative for fever or weight change.  Respiratory: Negative for cough and shortness of breath.   Cardiovascular: Negative for chest pain or palpitations.  Gastrointestinal: Negative for abdominal pain, no bowel changes.  Musculoskeletal: Negative for gait problem or joint swelling.  Skin: Negative for rash.  Neurological: Negative for dizziness or headache.  No other specific complaints in a complete review of systems (except as listed in HPI above).  Objective  Vitals:   03/02/20 1306  BP: 120/80  Pulse: 90  Resp: 16  Temp: (!) 97.1 F (36.2 C)  TempSrc: Temporal  SpO2: 96%  Weight: 218 lb 8 oz (99.1 kg)  Height: 5\' 3"  (1.6 m)    Body mass index is 38.71 kg/m.  Physical Exam  Constitutional: Patient appears well-developed and  well-nourished. Obese  No distress.  HEENT: head atraumatic, normocephalic, pupils equal and reactive to light Cardiovascular: Normal rate, regular rhythm and normal heart sounds.  No murmur heard. No BLE edema. Pulmonary/Chest: Effort normal and breath sounds normal. No respiratory distress. Abdominal: Soft.  There is no tenderness. Psychiatric: Patient has a normal mood and affect. behavior is normal. Judgment and thought content normal. Skin: no rashes Muscular skeletal: decrease flexion right knee   Recent Results (from the past 2160 hour(s))  Cancer antigen 27.29     Status: None   Collection Time: 12/26/19 10:18 AM  Result Value Ref Range   CA 27.29 7.0 0.0 - 38.6 U/mL    Comment: (NOTE) Siemens Centaur Immunochemiluminometric Methodology (ICMA) Values obtained with different assay methods or kits cannot be used interchangeably. Results cannot be interpreted as absolute evidence of the presence or absence of malignant disease. Performed At: [facility] [place], [place] [id number] Rush Farmer MD [id number]   Comprehensive metabolic panel     Status: None   Collection Time: 12/26/19 10:18 AM  Result Value Ref Range   Sodium 135 135 - 145 mmol/L   Potassium 3.9 3.5 - 5.1 mmol/L   Chloride 102 98 - 111 mmol/L   CO2 24 22 - 32 mmol/L   Glucose, Bld 92 70 - 99 mg/dL    Comment: Glucose reference range applies only to samples taken after fasting for at least 8 hours.   BUN 18 8 - 23 mg/dL   Creatinine, Ser 0.51 0.44 - 1.00 mg/dL   Calcium 9.3 8.9 - 10.3 mg/dL   Total Protein 7.9 6.5 - 8.1 g/dL   Albumin 4.1 3.5 - 5.0 g/dL   AST 22 15 - 41 U/L   ALT 23 0 - 44 U/L   Alkaline Phosphatase 88 38 - 126 U/L   Total Bilirubin 0.8 0.3 - 1.2 mg/dL   GFR calc non Af Amer >60 >60 mL/min   GFR calc Af Amer >60 >60 mL/min   Anion gap 9 5 - 15    Comment: Performed at [facility], [address] [place], [place] [address]  CBC with  Differential/Platelet     Status: None   Collection Time: 12/26/19 10:18 AM  Result Value Ref Range   WBC 6.2 4.0 -  10.5 K/uL   RBC 4.27 3.87 - 5.11 MIL/uL   Hemoglobin 12.6 12.0 - 15.0 g/dL   HCT 38.1 36.0 - 46.0 %   MCV 89.2 80.0 - 100.0 fL   MCH 29.5 26.0 - 34.0 pg   MCHC 33.1 30.0 - 36.0 g/dL   RDW 14.9 11.5 - 15.5 %   Platelets 236 150 - 400 K/uL   nRBC 0.0 0.0 - 0.2 %   Neutrophils Relative % 47 %   Neutro Abs 3.0 1.7 - 7.7 K/uL   Lymphocytes Relative 35 %   Lymphs Abs 2.2 0.7 - 4.0 K/uL   Monocytes Relative 10 %   Monocytes Absolute 0.6 0.1 - 1.0 K/uL   Eosinophils Relative 7 %   Eosinophils Absolute 0.4 0.0 - 0.5 K/uL   Basophils Relative 1 %   Basophils Absolute 0.0 0.0 - 0.1 K/uL   Immature Granulocytes 0 %   Abs Immature Granulocytes 0.01 0.00 - 0.07 K/uL    Comment: Performed at [facility], [address] [place], [place] [address]  Urinalysis, Complete     Status: Abnormal   Collection Time: 12/29/19  3:04 PM  Result Value Ref Range   Specific Gravity, UA 1.025 1.005 - 1.030   pH, UA 5.5 5.0 - 7.5   Color, UA Yellow Yellow   Appearance Ur Hazy (A) Clear   Leukocytes,UA 1+ (A) Negative   Protein,UA Trace (A) Negative/Trace   Glucose, UA Negative Negative   Ketones, UA Negative Negative   RBC, UA Negative Negative   Bilirubin, UA Negative Negative   Urobilinogen, Ur 0.2 0.2 - 1.0 mg/dL   Nitrite, UA Negative Negative   Microscopic Examination See below:   Microscopic Examination     Status: Abnormal   Collection Time: 12/29/19  3:04 PM   URINE  Result Value Ref Range   WBC, UA 11-30 (A) 0 - 5 /hpf   RBC None seen 0 - 2 /hpf   Epithelial Cells (non renal) 0-10 0 - 10 /hpf   Renal Epithel, UA 0-10 (A) None seen /hpf   Bacteria, UA Few None seen/Few     PHQ2/9: Depression screen [facility] 2/9 03/02/2020 01/01/2020 11/03/2019 10/15/2019 09/17/2019  Decreased Interest 1 0 0 0 0  Down, Depressed, Hopeless 1 0 0 0 0  PHQ - 2 Score 2 0 0 0 0  Altered  sleeping 1 - 0 - 0  Tired, decreased energy 2 - 0 - 0  Change in appetite 1 - 0 - 0  Feeling bad or failure about yourself  1 - 0 - 0  Trouble concentrating 0 - 0 - 0  Moving slowly or fidgety/restless 0 - 0 - 0  Suicidal thoughts 0 - 0 - 0  PHQ-9 Score 7 - 0 - 0  Difficult doing work/chores Somewhat difficult - - - Not difficult at all  Some recent data might be hidden    phq 9 is positive   Fall Risk: Fall Risk  03/02/2020 01/01/2020 11/03/2019 10/15/2019 09/17/2019  Falls in the past year? 0 0 0 0 0  Number falls in past yr: 0 0 0 - 0  Injury with Fall? 0 0 0 - 0  Risk for fall due to : - Orthopedic patient - Impaired balance/gait;Orthopedic patient -  Follow up Falls evaluation completed Falls prevention discussed - Falls prevention discussed -      Assessment & Plan  1. Essential hypertension  - metoprolol succinate (TOPROL-XL) 50 MG 24 hr  tablet; Take 1 tablet (50 mg total) by mouth daily. Take with or immediately following a meal.  Dispense: 90 tablet; Refill: 1  2. Age-related osteoporosis without current pathological fracture   3. GERD without esophagitis  - pantoprazole (PROTONIX) 40 MG tablet; Take 1 tablet (40 mg total) by mouth daily.  Dispense: 90 tablet; Refill: 1  4. Pre-diabetes   5. Hyperglycemia   6 Morbid obesity ([place])  She has OA, pre-diabetes, HTN, , discussed importance of physical activity  7. Primary osteoarthritis of both knees  - meloxicam (MOBIC) 15 MG tablet; Take 1 tablet (15 mg total) by mouth daily.  Dispense: 90 tablet; Refill: 1  8. Dysthymia  - citalopram (CELEXA) 20 MG tablet; Take 1 tablet (20 mg total) by mouth daily.  Dispense: 90 tablet; Refill: 0 Discussed grieving counseling though hospice  9. Intertrigo  Advised to continue anti fungal spray/powder  10. Hodgkin's disease, stage IIA ([place])  Sees Dr. Mike Gip   11. Malignant neoplasm of right female breast, unspecified estrogen receptor status, unspecified site of  breast ([place])   12. Flank mass  - [place] CHEST SOFT TISSUE; Future

## 2020-03-08 ENCOUNTER — Other Ambulatory Visit: Payer: Self-pay

## 2020-03-08 ENCOUNTER — Ambulatory Visit: Payer: Self-pay | Admitting: Urology

## 2020-03-08 ENCOUNTER — Ambulatory Visit
Admission: RE | Admit: 2020-03-08 | Discharge: 2020-03-08 | Disposition: A | Discharge status: Home / Self Care | Payer: Medicare Other | Source: Ambulatory Visit | Attending: Family Medicine | Admitting: Family Medicine

## 2020-03-08 DIAGNOSIS — R1900 Intra-abdominal and pelvic swelling, mass and lump, unspecified site: Secondary | ICD-10-CM | POA: Insufficient documentation

## 2020-03-09 ENCOUNTER — Ambulatory Visit (INDEPENDENT_AMBULATORY_CARE_PROVIDER_SITE_OTHER): Payer: Medicare Other | Admitting: Urology

## 2020-03-09 DIAGNOSIS — R350 Frequency of micturition: Secondary | ICD-10-CM | POA: Diagnosis not present

## 2020-03-09 NOTE — Progress Notes (Signed)
PTNS  Session # 9  Health & Social Factors: no change Caffeine: 1 Alcohol: 0 Daytime voids #per day: 5 Night-time voids #per night: 1-2 Urgency: mild Incontinence Episodes #per day: 0 Ankle used: left Treatment Setting: 5 Feeling/ Response: both Comments: tolerated well  Preformed By: Fonnie Jarvis, CMA   Follow Up: 1wk

## 2020-03-09 NOTE — Patient Instructions (Signed)
Tracking Your Bladder Symptoms    Patient Name:___________________________________________________   Sample: Day   Daytime Voids  Nighttime Voids Urgency for the Day(0-4) Number of Accidents Beverage Comments  Monday IIII II 2 I Water IIII Coffee  I      Week Starting:____________________________________   Day Daytime  Voids Nighttime  Voids Urgency for  The Day(0-4) Number of Accidents Beverages Comments                                                           This week my symptoms were:  O much better  O better O the same O worse   

## 2020-03-15 ENCOUNTER — Ambulatory Visit (INDEPENDENT_AMBULATORY_CARE_PROVIDER_SITE_OTHER): Payer: Medicare Other | Admitting: Urology

## 2020-03-15 ENCOUNTER — Encounter: Payer: Self-pay | Admitting: Urology

## 2020-03-15 ENCOUNTER — Ambulatory Visit: Payer: Self-pay | Admitting: Urology

## 2020-03-15 ENCOUNTER — Other Ambulatory Visit: Payer: Self-pay

## 2020-03-15 VITALS — BP 135/84 | HR 83 | Ht 63.0 in | Wt 220.0 lb

## 2020-03-15 DIAGNOSIS — R350 Frequency of micturition: Secondary | ICD-10-CM | POA: Diagnosis not present

## 2020-03-15 NOTE — Progress Notes (Signed)
PTNS  Session # 10   Health & Social Factors: no change Caffeine: 1 Alcohol: 1 Daytime voids #per day: 4-6 Night-time voids #per night: 1-2 Urgency: mild Incontinence Episodes #per day: 0 Ankle used: left Treatment Setting: 2 Feeling/ Response: both Comments: patient tolerated well   Preformed By: Edwin Dada, cma   Follow Up: 1 week

## 2020-03-22 ENCOUNTER — Other Ambulatory Visit: Payer: Self-pay

## 2020-03-22 ENCOUNTER — Ambulatory Visit: Payer: Medicare Other

## 2020-03-22 DIAGNOSIS — N3946 Mixed incontinence: Secondary | ICD-10-CM

## 2020-03-22 DIAGNOSIS — R350 Frequency of micturition: Secondary | ICD-10-CM

## 2020-03-22 NOTE — Progress Notes (Signed)
PTNS  Session # 11  Health & Social Factors: No Change Caffeine: 1 Alcohol: 1 Daytime voids #per day: 5 Night-time voids #per night: 1 Urgency: Mild Incontinence Episodes #per day: 0 Ankle used: Left Treatment Setting: 2 Feeling/ Response: Sensory & Toe Flex  Comments: N/A  Preformed By: Gordy Clement, CMA   Follow Up: RTC in 1 week

## 2020-03-30 ENCOUNTER — Ambulatory Visit: Payer: Self-pay

## 2020-03-30 ENCOUNTER — Other Ambulatory Visit: Payer: Self-pay

## 2020-03-30 ENCOUNTER — Ambulatory Visit (INDEPENDENT_AMBULATORY_CARE_PROVIDER_SITE_OTHER): Payer: Medicare Other | Admitting: *Deleted

## 2020-03-30 DIAGNOSIS — R350 Frequency of micturition: Secondary | ICD-10-CM | POA: Diagnosis not present

## 2020-03-30 NOTE — Progress Notes (Signed)
PTNS  Session # 12  Health & Social Factors: No Change Caffeine: 1 Alcohol: 1 Daytime voids #per day: 5 Night-time voids #per night: 1 Urgency: Mild Incontinence Episodes #per day: 0 Ankle used: Left Treatment Setting: 3 Feeling/ Response: Sensory & Toe Flex  Comments: N/A  Preformed By: Gaspar Cola  CMA   Follow Up: RTC in 1 month

## 2020-04-05 ENCOUNTER — Ambulatory Visit: Payer: Medicare Other

## 2020-04-05 DIAGNOSIS — I10 Essential (primary) hypertension: Secondary | ICD-10-CM

## 2020-04-06 NOTE — Patient Instructions (Addendum)
Thank you for allowing the Chronic Care Management team to participate in your care.  Goals Addressed            This Visit's Progress   . COMPLETED: Improve Ability to Self-Manage Chronic Illnesses       Current Barriers:  . Chronic Disease Management support related to Hypertension and Osteoarthritis. Required right knee replacement on 10/03/19.  Case Manager Clinical Goal(s):  Marland Kitchen Over the next 90 days, patient will not require hospital admission related to chronic illnesses or surgical complications.-Complete . Over the next 90 days, patient will continue to take all medications as prescribed.-Complete . Over the next 90 days, patient will attend all scheduled provider appointments and surgical follow-up visits.-Complete . Over the next 90 days, patient will complete physical therapy sessions as recommended.-Complete . Over the next 90 days, patient will monitor her blood pressure and record readings.-Complete . Over the next 90 days, patient will follow recommended safety and fall prevention measures.-Complete  Interventions:  . Discussed medications, blood pressure readings and compliance with provider's treatment recommendations. Reports taking all medications as prescribed. Denies concerns regarding prescription costs. Reports monitoring blood pressure as recommended with systolic readings in the 726'O and 130's. Reports diastolic readings in the 03'T. Most recent reading- 128/70.  Marland Kitchen Discussed plans for ongoing care management follow up. Reports doing very well, attending medical appointments as scheduled and following provider's treatment recommendations. Denies urgent concerns or changes in care management needs. Encouraged to contact staff  or care management team if additional assistance or outreach is needed.   Patient Self Care Activities:  . Self administers medications as prescribed . Attends all scheduled provider appointments . Calls pharmacy for medication  refills . Performs ADL's independently . Performs IADL's independently . Calls provider office for new concerns or questions    Please see past updates related to this goal by clicking on the "Past Updates" button in the selected goal         Ms. Deal verbalized understanding of the information discussed during the brief outreach today. Declined need for mailed/printed information.   Ms. Feehan reports doing very well and remains compliant with provider recommendations. Care management goals complete.  Declined urgent concerns or changes in care management needs. No further outreach required. Encouraged to notify team if health status changes and assistance is required.    [facility] Care Management [phone]

## 2020-04-06 NOTE — Chronic Care Management (AMB) (Signed)
Chronic Care Management   Follow Up Note    Name: Mary Todd MRN: [id number] DOB: 1946-10-16  Primary Care Provider: Steele Sizer, MD Reason for referral : Chronic Care Management   Mary Todd is a 74 y.o. year old female who is a primary care patient of Steele Sizer, MD. The CCM team was initially consulted for assistance with chronic disease management and care coordination. A brief telephonic outreach was conducted today.  Review of Mary Todd's status, including review of consultants reports, relevant labs and test results was conducted today. Collaboration with appropriate care team members was performed as part of the comprehensive evaluation and provision of chronic care management services.    SDOH (Social Determinants of Health) assessments performed: No     Outpatient Encounter Medications as of 04/05/2020  Medication Sig  . acetaminophen (TYLENOL) 650 MG CR tablet Take 1,300 mg by mouth every 8 (eight) hours as needed for pain.  Marland Kitchen amLODipine-valsartan (EXFORGE) 5-160 MG tablet Take 1 tablet by mouth daily.  Marland Kitchen anastrozole (ARIMIDEX) 1 MG tablet Take 1 tablet by mouth once daily  . aspirin 81 MG tablet Take 81 mg by mouth daily.  Marland Kitchen CALCIUM-MAGNESIUM PO Take 1 tablet by mouth daily.  . cetirizine (ZYRTEC) 10 MG tablet Take 10 mg by mouth daily.  . Cholecalciferol (VITAMIN D-3 PO) Take 1 tablet by mouth daily.  . citalopram (CELEXA) 20 MG tablet Take 1 tablet (20 mg total) by mouth daily.  Marland Kitchen denosumab (PROLIA) 60 MG/ML SOSY injection Inject 60 mg into the skin every 6 (six) months.  . meloxicam (MOBIC) 15 MG tablet Take 1 tablet (15 mg total) by mouth daily.  . metoprolol succinate (TOPROL-XL) 50 MG 24 hr tablet Take 1 tablet (50 mg total) by mouth daily. Take with or immediately following a meal.  . Multiple Vitamin (MULTIVITAMIN) tablet Take 1 tablet by mouth daily.  Marland Kitchen nystatin (MYCOSTATIN/NYSTOP) powder Apply topically 2 (two) times daily.  .  pantoprazole (PROTONIX) 40 MG tablet Take 1 tablet (40 mg total) by mouth daily.  Marland Kitchen tolterodine (DETROL LA) 4 MG 24 hr capsule Take 4 mg by mouth daily.   No facility-administered encounter medications on file as of 04/05/2020.     Objective:  BP Readings from Last 3 Encounters:  03/15/20 135/84  03/02/20 120/80  12/29/19 (!) 162/91     Goals Addressed            This Visit's Progress   . COMPLETED: Improve Ability to Self-Manage Chronic Illnesses       Current Barriers:  . Chronic Disease Management support related to Hypertension and Osteoarthritis. Required right knee replacement on 10/03/19.  Case Manager Clinical Goal(s):  Marland Kitchen Over the next 90 days, patient will not require hospital admission related to chronic illnesses or surgical complications.-Complete . Over the next 90 days, patient will continue to take all medications as prescribed.-Complete . Over the next 90 days, patient will attend all scheduled provider appointments and surgical follow-up visits.-Complete . Over the next 90 days, patient will complete physical therapy sessions as recommended.-Complete . Over the next 90 days, patient will monitor her blood pressure and record readings.-Complete . Over the next 90 days, patient will follow recommended safety and fall prevention measures.-Complete  Interventions:  . Discussed medications, blood pressure readings and compliance with provider's treatment recommendations. Reports taking all medications as prescribed. Denies concerns regarding prescription costs. Reports monitoring blood pressure as recommended with systolic readings in the 478'G and 130's. Reports diastolic readings  in the 70's. Most recent reading- 128/70.  Marland Kitchen Discussed plans for ongoing care management follow up. Reports doing very well, attending medical appointments as scheduled and following provider's treatment recommendations. Denies urgent concerns or changes in care management needs. Encouraged to  contact staff  or care management team if additional assistance or outreach is needed.   Patient Self Care Activities:  . Self administers medications as prescribed . Attends all scheduled provider appointments . Calls pharmacy for medication refills . Performs ADL's independently . Performs IADL's independently . Calls provider office for new concerns or questions    Please see past updates related to this goal by clicking on the "Past Updates" button in the selected goal          PLAN Mary Todd reports doing well and met her care management goals. Declined urgent concerns or changes in care management needs. No further outreach required. Encouraged to notify care management team if health needs change and assistance is needed.   [facility] Care Management [phone]

## 2020-04-13 ENCOUNTER — Ambulatory Visit (INDEPENDENT_AMBULATORY_CARE_PROVIDER_SITE_OTHER): Payer: Medicare Other | Admitting: Family Medicine

## 2020-04-13 ENCOUNTER — Encounter: Payer: Self-pay | Admitting: Family Medicine

## 2020-04-13 ENCOUNTER — Other Ambulatory Visit: Payer: Self-pay

## 2020-04-13 VITALS — Ht 63.0 in | Wt 210.0 lb

## 2020-04-13 DIAGNOSIS — D171 Benign lipomatous neoplasm of skin and subcutaneous tissue of trunk: Secondary | ICD-10-CM | POA: Diagnosis not present

## 2020-04-13 DIAGNOSIS — F341 Dysthymic disorder: Secondary | ICD-10-CM

## 2020-04-13 MED ORDER — CITALOPRAM HYDROBROMIDE 20 MG PO TABS: 20.0000 mg | ORAL_TABLET | Freq: Every day | ORAL | 0 refills | Status: DC

## 2020-04-13 NOTE — Progress Notes (Signed)
Name: Mary Todd   MRN: [id number]    DOB: August 07, 1946   Date:04/13/2020       Progress Note  Subjective  Chief Complaint  Chief Complaint  Patient presents with  . Follow-up  . Depression  . Hypertension    I connected with  Leana Gamer Staff on 04/13/20 at  2:40 PM EDT by telephone and verified that I am speaking with the correct person using two identifiers.   I discussed the limitations, risks, security and privacy concerns of performing an evaluation and management service by telephone and the availability of in person appointments. Staff also discussed with the patient that there may be a patient responsible charge related to this service. [place] Provider Location: [facility]   HPI  Dysthymia: she was seen back in April 2021 and  had noticed a lack of motivation, she just sits around all day, she has been watching her great niece and nephew twice a week, they are toddlers and it makes her tired but she likes being able to care for them. . She states her father died 05/28/19, she was the primary caregiver for one year. She noticed  since she died she started to noticed her mood going down. She got concerned when she missed a water aerobics class just because she didn't have the motivation to go. Since we started her on Celexa she is feeling better, going to water aerobics, currently at the beach with her husband to care for herself. She wants to stay on medication. Phq 9 down from 7 to zero today   Patient Active Problem List   Diagnosis Date Noted  . Total knee replacement status, right 10/03/2019  . Encounter for screening mammogram for breast cancer 07/02/2018  . Anterolisthesis 08/06/2017  . DDD (degenerative disc disease), lumbosacral 08/06/2017  . Chondromalacia patellae 07/03/2017  . Osteoporosis 12/14/2016  . Morbid obesity ([place]) 07/17/2016  . Spinal stenosis in cervical region May 27, 2016  . Thyroid nodule May 27, 2016  .  GERD without esophagitis 03/16/2016  . Neck muscle spasm 03/16/2016  . History of back surgery 03/16/2016  . Osteoarthritis of both knees 03/16/2016  . Large breasts 03/16/2016  . Hypertension, benign 03/16/2016  . Perennial allergic rhinitis with seasonal variation 03/16/2016  . History of pneumococcal pneumonia 09/20/2015  . Hodgkin disease ([place]) 07/15/2014  . Breast cancer, right ([place]) 06/26/2013    Social History   Tobacco Use  . Smoking status: Never Smoker  . Smokeless tobacco: Never Used  . Tobacco comment: smoking cessation materials not required  Substance Use Topics  . Alcohol use: Yes    Alcohol/week: 2.0 standard drinks    Types: 2 Glasses of wine per week    Comment: occassionally     Current Outpatient Medications:  .  acetaminophen (TYLENOL) 650 MG CR tablet, Take 1,300 mg by mouth every 8 (eight) hours as needed for pain., Disp: , Rfl:  .  amLODipine-valsartan (EXFORGE) 5-160 MG tablet, Take 1 tablet by mouth daily., Disp: 90 tablet, Rfl: 1 .  anastrozole (ARIMIDEX) 1 MG tablet, Take 1 tablet by mouth once daily, Disp: 30 tablet, Rfl: 3 .  aspirin 81 MG tablet, Take 81 mg by mouth daily., Disp: , Rfl:  .  CALCIUM-MAGNESIUM PO, Take 1 tablet by mouth daily., Disp: , Rfl:  .  cetirizine (ZYRTEC) 10 MG tablet, Take 10 mg by mouth daily., Disp: , Rfl:  .  Cholecalciferol (VITAMIN D-3 PO), Take 1 tablet by mouth  daily., Disp: , Rfl:  .  citalopram (CELEXA) 20 MG tablet, Take 1 tablet (20 mg total) by mouth daily., Disp: 90 tablet, Rfl: 0 .  denosumab (PROLIA) 60 MG/ML SOSY injection, Inject 60 mg into the skin every 6 (six) months., Disp: , Rfl:  .  meloxicam (MOBIC) 15 MG tablet, Take 1 tablet (15 mg total) by mouth daily., Disp: 90 tablet, Rfl: 1 .  metoprolol succinate (TOPROL-XL) 50 MG 24 hr tablet, Take 1 tablet (50 mg total) by mouth daily. Take with or immediately following a meal., Disp: 90 tablet, Rfl: 1 .  Multiple Vitamin (MULTIVITAMIN) tablet, Take 1  tablet by mouth daily., Disp: , Rfl:  .  nystatin (MYCOSTATIN/NYSTOP) powder, Apply topically 2 (two) times daily., Disp: , Rfl:  .  pantoprazole (PROTONIX) 40 MG tablet, Take 1 tablet (40 mg total) by mouth daily., Disp: 90 tablet, Rfl: 1 .  tolterodine (DETROL LA) 4 MG 24 hr capsule, Take 4 mg by mouth daily., Disp: , Rfl:   Allergies  Allergen Reactions  . Shrimp [Shellfish Allergy] Hives  . Augmentin [Amoxicillin-Pot Clavulanate] Nausea Only    I personally reviewed active problem list, medication list, allergies, family history, social history, health maintenance with the patient/caregiver today.  ROS  Ten systems reviewed and is negative except as mentioned in HPI   Objective  Virtual encounter, vitals not obtained.  Body mass index is 37.2 kg/m.  Nursing Note and Vital Signs reviewed.  Physical Exam  Awake, alert and oriented    PHQ9 SCORE ONLY 04/13/2020 03/02/2020 01/01/2020  PHQ-9 Total Score 0 7 0   Assessment & Plan   1. Dysthymia  She is responding well to medication  - citalopram (CELEXA) 20 MG tablet; Take 1 tablet (20 mg total) by mouth daily.  Dispense: 90 tablet; Refill: 0  2. Lipoma of torso  She has no pain or discomfort, had [place] and looks like lipoma   -Red flags and when to present for emergency care or RTC including fever >101.55F, chest pain, shortness of breath, new/worsening/un-resolving symptoms,  reviewed with patient at time of visit. Follow up and care instructions discussed and provided in AVS. - I discussed the assessment and treatment plan with the patient. The patient was provided an opportunity to ask questions and all were answered. The patient agreed with the plan and demonstrated an understanding of the instructions.  - The patient was advised to call back or seek an in-person evaluation if the symptoms worsen or if the condition fails to improve as anticipated.  I provided 15 minutes of non-face-to-face time during this  encounter.  Loistine Chance, MD

## 2020-05-05 ENCOUNTER — Other Ambulatory Visit: Payer: Self-pay | Admitting: Hematology and Oncology

## 2020-05-05 DIAGNOSIS — Z853 Personal history of malignant neoplasm of breast: Secondary | ICD-10-CM

## 2020-05-11 ENCOUNTER — Ambulatory Visit (INDEPENDENT_AMBULATORY_CARE_PROVIDER_SITE_OTHER): Payer: Medicare Other | Admitting: Urology

## 2020-05-11 ENCOUNTER — Other Ambulatory Visit: Payer: Self-pay

## 2020-05-11 DIAGNOSIS — N3946 Mixed incontinence: Secondary | ICD-10-CM | POA: Diagnosis not present

## 2020-05-11 DIAGNOSIS — R351 Nocturia: Secondary | ICD-10-CM

## 2020-05-11 NOTE — Progress Notes (Signed)
PTNS  Session # Monthly Maintenance   Health & Social Factors: No change Caffeine: 1 per day Alcohol: 2 per week Daytime voids #per day: 5 Night-time voids #per night: 1 Urgency: Mild Incontinence Episodes #per day: 0 Ankle used: Right Treatment Setting: 2 Feeling/ Response: Toe flex & sensory Comments: Patient tolerated well, no complications were noted.   Performed By: Bradly Bienenstock, CMA   Follow Up: RTC monthly for PTNS

## 2020-05-11 NOTE — Progress Notes (Signed)
05/11/2020 4:34 PM   [place] [birth year] [phone]  Referring provider: Steele Sizer, MD [address] [place],  [place] [address]  Chief Complaint  Patient presents with   Urinary Frequency    HPI: Mary Todd is a 74 y.o. female with urge incontinence and nocturia who presents today for follow up.   Urge incontinence Seen and evaluated by Dr. Matilde Sprang.  Failed Myrbetriq.  At goal with tolterodine and PTNS.   Nocturia Very pleased with the results from the PTNS treatments.  She is now down to nocturia x 1.    PMH: Past Medical History:  Diagnosis Date   Allergy    Arthritis    knees, lower back   Breast cancer ([place]) right   2011, T1A N0, radiation   Esophageal reflux    Hodgkin's disease ([place]) 2004   Hypertension 2006   Osteoporosis    Personal history of malignant neoplasm of breast 2011   right breast   Personal history of radiation therapy 2011   RIGHT lumpectomy w/ radiation    Surgical History: Past Surgical History:  Procedure Laterality Date   ABDOMINAL HYSTERECTOMY     BACK SURGERY     BREAST BIOPSY Right 2011   radiation   BREAST LUMPECTOMY Right 2011   w/ radiation   BREAST SURGERY Right 2011   right breast lumpectomy,L/SN/R for right breast CA    CATARACT EXTRACTION W/PHACO Left 06/10/2018   Procedure: CATARACT EXTRACTION PHACO AND INTRAOCULAR LENS PLACEMENT ([place]) LEFT;  Surgeon: Eulogio Bear, MD;  Location: [facility];  Service: Ophthalmology;  Laterality: Left;  Diabetic - oral meds   CATARACT EXTRACTION W/PHACO Right 07/15/2018   Procedure: CATARACT EXTRACTION PHACO AND INTRAOCULAR LENS PLACEMENT ([place]) RIGHT;  Surgeon: Eulogio Bear, MD;  Location: [facility];  Service: Ophthalmology;  Laterality: Right;   COLONOSCOPY  2003   PORT A CATH REVISION     REPLACEMENT TOTAL KNEE Right 10/03/2019   DR. Harlow Mares    WRIST SURGERY  2005    Home Medications:   Allergies as of 05/11/2020      Reactions   Shrimp [shellfish Allergy] Hives   Augmentin [amoxicillin-pot Clavulanate] Nausea Only      Medication List       Accurate as of May 11, 2020  4:34 PM. If you have any questions, ask your nurse or doctor.        acetaminophen 650 MG CR tablet Commonly known as: TYLENOL Take 1,300 mg by mouth every 8 (eight) hours as needed for pain.   amLODipine-valsartan 5-160 MG tablet Commonly known as: Exforge Take 1 tablet by mouth daily.   anastrozole 1 MG tablet Commonly known as: ARIMIDEX Take 1 tablet by mouth once daily   aspirin 81 MG tablet Take 81 mg by mouth daily.   CALCIUM-MAGNESIUM PO Take 1 tablet by mouth daily.   cetirizine 10 MG tablet Commonly known as: ZYRTEC Take 10 mg by mouth daily.   citalopram 20 MG tablet Commonly known as: CeleXA Take 1 tablet (20 mg total) by mouth daily.   denosumab 60 MG/ML Sosy injection Commonly known as: PROLIA Inject 60 mg into the skin every 6 (six) months.   meloxicam 15 MG tablet Commonly known as: MOBIC Take 1 tablet (15 mg total) by mouth daily.   metoprolol succinate 50 MG 24 hr tablet Commonly known as: TOPROL-XL Take 1 tablet (50 mg total) by mouth daily. Take with or immediately following a  meal.   multivitamin tablet Take 1 tablet by mouth daily.   nystatin powder Commonly known as: MYCOSTATIN/NYSTOP Apply topically 2 (two) times daily.   pantoprazole 40 MG tablet Commonly known as: PROTONIX Take 1 tablet (40 mg total) by mouth daily.   tolterodine 4 MG 24 hr capsule Commonly known as: DETROL LA Take 4 mg by mouth daily.   VITAMIN D-3 PO Take 1 tablet by mouth daily.       Allergies:  Allergies  Allergen Reactions   Shrimp [Shellfish Allergy] Hives   Augmentin [Amoxicillin-Pot Clavulanate] Nausea Only    Family History: Family History  Problem Relation Age of Onset   Cancer Father    Bone cancer Father    Breast cancer Mother 62    Stroke Mother    Colon cancer Maternal Grandmother    Breast cancer Cousin     Social History:  reports that she has never smoked. She has never used smokeless tobacco. She reports current alcohol use of about 2.0 standard drinks of alcohol per week. She reports that she does not use drugs.  ROS: Pertinent ROS in HPI  Physical Exam: There were no vitals taken for this visit.  Constitutional:  Well nourished. Alert and oriented, No acute distress. HEENT: [place] AT, mask in place.  Trachea midline Cardiovascular: No clubbing, cyanosis, or edema. Respiratory: Normal respiratory effort, no increased work of breathing. Neurologic: Grossly intact, no focal deficits, moving all 4 extremities. Psychiatric: Normal mood and affect.  Laboratory Data: Lab Results  Component Value Date   WBC 6.2 12/26/2019   HGB 12.6 12/26/2019   HCT 38.1 12/26/2019   MCV 89.2 12/26/2019   PLT 236 12/26/2019    Lab Results  Component Value Date   CREATININE 0.51 12/26/2019    Lab Results  Component Value Date   HGBA1C 5.6 08/11/2019    Lab Results  Component Value Date   TSH 0.60 08/11/2019       Component Value Date/Time   CHOL 195 08/11/2019 0000   HDL 76 08/11/2019 0000   CHOLHDL 2.6 08/11/2019 0000   LDLCALC 100 (H) 08/11/2019 0000    Lab Results  Component Value Date   AST 22 12/26/2019   Lab Results  Component Value Date   ALT 23 12/26/2019    Urinalysis    Component Value Date/Time   COLORURINE YELLOW (A) 12/04/2018 0906   APPEARANCEUR Hazy (A) 12/29/2019 1504   LABSPEC 1.009 12/04/2018 0906   LABSPEC 1.026 03/19/2012 2152   PHURINE 6.0 12/04/2018 0906   GLUCOSEU Negative 12/29/2019 1504   GLUCOSEU Negative 03/19/2012 2152   HGBUR NEGATIVE 12/04/2018 0906   BILIRUBINUR Negative 12/29/2019 1504   BILIRUBINUR Negative 03/19/2012 2152   KETONESUR NEGATIVE 12/04/2018 0906   PROTEINUR Trace (A) 12/29/2019 1504   PROTEINUR NEGATIVE 12/04/2018 0906   NITRITE Negative  12/29/2019 1504   NITRITE NEGATIVE 12/04/2018 0906   LEUKOCYTESUR 1+ (A) 12/29/2019 1504   LEUKOCYTESUR Trace 03/19/2012 2152    I have reviewed the labs.   Pertinent Imaging: No recent imaging  Assessment & Plan:    1. Mixed incontinence - PTNS-Percutaneous Tibial Nerve Stimulati  2. Nocturia - At goal with PTNS   No follow-ups on file.  These notes generated with voice recognition software. I apologize for typographical errors.  Zara Council, PA-C  [facility] [address]  [place], [place] [address] [phone]

## 2020-05-17 DIAGNOSIS — E042 Nontoxic multinodular goiter: Secondary | ICD-10-CM | POA: Diagnosis not present

## 2020-06-02 ENCOUNTER — Other Ambulatory Visit: Payer: Self-pay | Admitting: Family Medicine

## 2020-06-02 DIAGNOSIS — K219 Gastro-esophageal reflux disease without esophagitis: Secondary | ICD-10-CM

## 2020-06-02 DIAGNOSIS — E042 Nontoxic multinodular goiter: Secondary | ICD-10-CM | POA: Diagnosis not present

## 2020-06-06 NOTE — Progress Notes (Deleted)
PTNS  Session # ***  Health & Social Factors: *** Caffeine: *** Alcohol: *** Daytime voids #per day: *** Night-time voids #per night: *** Urgency: *** Incontinence Episodes #per day: *** Ankle used: *** Treatment Setting: *** Feeling/ Response: *** Comments: ***  Performed By: ***  Assistant: ***  Follow Up: ***  

## 2020-06-07 ENCOUNTER — Ambulatory Visit: Payer: Self-pay | Admitting: Urology

## 2020-06-13 NOTE — Progress Notes (Signed)
PTNS  Session # Monthly maintenance.   Health & Social Factors: No change Caffeine: 1 Alcohol: 1 Daytime voids #per day: x 4-5 Night-time voids #per night: x 1 Urgency: mild Incontinence Episodes #per day: 0 Ankle used: left Treatment Setting: 2 Feeling/ Response: both Comments: Patient  procudes  Performed By: Felix Pacini, CMA  Assistant: None  Follow Up: 1 month PTNS maintenance.

## 2020-06-14 ENCOUNTER — Other Ambulatory Visit: Payer: Self-pay

## 2020-06-14 ENCOUNTER — Encounter: Payer: Self-pay | Admitting: Urology

## 2020-06-14 ENCOUNTER — Ambulatory Visit (INDEPENDENT_AMBULATORY_CARE_PROVIDER_SITE_OTHER): Payer: Medicare Other | Admitting: Urology

## 2020-06-14 DIAGNOSIS — N3946 Mixed incontinence: Secondary | ICD-10-CM

## 2020-06-22 ENCOUNTER — Other Ambulatory Visit: Payer: Self-pay

## 2020-06-22 ENCOUNTER — Ambulatory Visit
Admission: EM | Admit: 2020-06-22 | Discharge: 2020-06-22 | Disposition: A | Discharge status: Home / Self Care | Payer: Medicare Other | Attending: Family Medicine | Admitting: Family Medicine

## 2020-06-22 DIAGNOSIS — M79672 Pain in left foot: Secondary | ICD-10-CM

## 2020-06-22 MED ORDER — DICLOFENAC SODIUM 75 MG PO TBEC
75.0000 mg | DELAYED_RELEASE_TABLET | Freq: Two times a day (BID) | ORAL | 0 refills | Status: DC | PRN
Start: 2020-06-22 — End: 2020-07-06

## 2020-06-22 NOTE — ED Triage Notes (Signed)
Patient in today w/ c/o Left foot pain, NKI. Patient states sx ongoing x 2 wks intermittently. Describes pain as sharp, stinging.

## 2020-06-22 NOTE — ED Provider Notes (Signed)
[facility]    CSN: [id number] Arrival date & time: 06/22/20  1843  History   Chief Complaint Chief Complaint  Patient presents with  . Foot Pain    Left   HPI  74 year old female presents with the above complaint.  Patient reports a 10 day history of ongoing left foot pain.  Pain is located laterally and dorsally.  Patient reports that she had a fall approximately 1 week ago but states that she did not injure her foot.  No change in physical activity.  No relieving factors.  Patient states that the pain is sharp.  She is taking meloxicam without relief.  Tylenol has not helped either. No reports of redness or swelling. No other complaints.   Past Medical History:  Diagnosis Date  . Allergy   . Arthritis    knees, lower back  . Breast cancer ([place]) right   2011, T1A N0, radiation  . Esophageal reflux   . Hodgkin's disease ([place]) 2004  . Hypertension 2006  . Osteoporosis   . Personal history of malignant neoplasm of breast 2011   right breast  . Personal history of radiation therapy 2011   RIGHT lumpectomy w/ radiation    Patient Active Problem List   Diagnosis Date Noted  . Total knee replacement status, right 10/03/2019  . Encounter for screening mammogram for breast cancer 07/02/2018  . Anterolisthesis 08/06/2017  . DDD (degenerative disc disease), lumbosacral 08/06/2017  . Chondromalacia patellae 07/03/2017  . Osteoporosis 12/14/2016  . Morbid obesity ([place]) 07/17/2016  . Spinal stenosis in cervical region 05/22/2016  . Thyroid nodule 05/22/2016  . GERD without esophagitis 03/16/2016  . Neck muscle spasm 03/16/2016  . History of back surgery 03/16/2016  . Osteoarthritis of both knees 03/16/2016  . Large breasts 03/16/2016  . Hypertension, benign 03/16/2016  . Perennial allergic rhinitis with seasonal variation 03/16/2016  . History of pneumococcal pneumonia 09/20/2015  . Hodgkin disease ([place]) 07/15/2014  . Breast cancer, right ([place]) 06/26/2013     Past Surgical History:  Procedure Laterality Date  . ABDOMINAL HYSTERECTOMY    . BACK SURGERY    . BREAST BIOPSY Right 2011   radiation  . BREAST LUMPECTOMY Right 2011   w/ radiation  . BREAST SURGERY Right 2011   right breast lumpectomy,L/SN/R for right breast CA   . CATARACT EXTRACTION W/PHACO Left 06/10/2018   Procedure: CATARACT EXTRACTION PHACO AND INTRAOCULAR LENS PLACEMENT ([place]) LEFT;  Surgeon: Eulogio Bear, MD;  Location: [facility];  Service: Ophthalmology;  Laterality: Left;  Diabetic - oral meds  . CATARACT EXTRACTION W/PHACO Right 07/15/2018   Procedure: CATARACT EXTRACTION PHACO AND INTRAOCULAR LENS PLACEMENT ([place]) RIGHT;  Surgeon: Eulogio Bear, MD;  Location: [facility];  Service: Ophthalmology;  Laterality: Right;  . COLONOSCOPY  2003  . PORT A CATH REVISION    . REPLACEMENT TOTAL KNEE Right 10/03/2019   DR. Harlow Mares   . WRIST SURGERY  2005    OB History    Gravida  2   Para  2   Term      Preterm      AB      Living  2     SAB      TAB      Ectopic      Multiple      Live Births           Obstetric Comments  1st Menstrual Cycle:  12 1st Pregnancy:  22.  Home Medications    Prior to Admission medications   Medication Sig Start Date End Date Taking? Authorizing Provider  acetaminophen (TYLENOL) 650 MG CR tablet Take 1,300 mg by mouth every 8 (eight) hours as needed for pain.   Yes [provider]  amLODipine-valsartan (EXFORGE) 5-160 MG tablet Take 1 tablet by mouth daily. 11/03/19  Yes Sowles, Drue Stager, MD  anastrozole (ARIMIDEX) 1 MG tablet Take 1 tablet by mouth once daily 05/05/20  Yes Corcoran, Drue Second, MD  aspirin 81 MG tablet Take 81 mg by mouth daily.   Yes [provider]  CALCIUM-MAGNESIUM PO Take 1 tablet by mouth daily.   Yes [provider]  cetirizine (ZYRTEC) 10 MG tablet Take 10 mg by mouth daily.   Yes [provider]  Cholecalciferol (VITAMIN D-3 PO)  Take 1 tablet by mouth daily.   Yes [provider]  citalopram (CELEXA) 20 MG tablet Take 1 tablet (20 mg total) by mouth daily. 04/13/20  Yes Sowles, Drue Stager, MD  denosumab (PROLIA) 60 MG/ML SOSY injection Inject 60 mg into the skin every 6 (six) months.   Yes Lequita Asal, MD  metoprolol succinate (TOPROL-XL) 50 MG 24 hr tablet Take 1 tablet (50 mg total) by mouth daily. Take with or immediately following a meal. 03/02/20  Yes Sowles, Drue Stager, MD  Multiple Vitamin (MULTIVITAMIN) tablet Take 1 tablet by mouth daily.   Yes [provider]  nystatin (MYCOSTATIN/NYSTOP) powder Apply topically 2 (two) times daily.   Yes [provider]  pantoprazole (PROTONIX) 40 MG tablet Take 1 tablet (40 mg total) by mouth daily. 03/02/20  Yes Sowles, Drue Stager, MD  tolterodine (DETROL LA) 4 MG 24 hr capsule Take 4 mg by mouth daily. 08/06/19  Yes [provider]  diclofenac (VOLTAREN) 75 MG EC tablet Take 1 tablet (75 mg total) by mouth 2 (two) times daily as needed for moderate pain. 06/22/20   Coral Spikes, DO    Family History Family History  Problem Relation Age of Onset  . Cancer Father   . Bone cancer Father   . Breast cancer Mother 48  . Stroke Mother   . Colon cancer Maternal Grandmother   . Breast cancer Cousin     Social History Social History   Tobacco Use  . Smoking status: Never Smoker  . Smokeless tobacco: Never Used  . Tobacco comment: smoking cessation materials not required  Vaping Use  . Vaping Use: Never used  Substance Use Topics  . Alcohol use: Yes    Alcohol/week: 2.0 standard drinks    Types: 2 Glasses of wine per week    Comment: occassionally  . Drug use: No     Allergies   Shrimp [shellfish allergy] and Augmentin [amoxicillin-pot clavulanate]   Review of Systems Review of Systems  Constitutional: Negative.   Musculoskeletal:       Left foot pain.   Physical Exam Triage Vital Signs ED Triage Vitals  Enc Vitals Group      BP 06/22/20 2007 (!) 155/97     Pulse Rate 06/22/20 2007 77     Resp 06/22/20 2007 18     Temp 06/22/20 2007 98 F (36.7 C)     Temp Source 06/22/20 2007 Oral     SpO2 06/22/20 2007 98 %     Weight 06/22/20 2011 218 lb (98.9 kg)     Height 06/22/20 2011 5\' 4"  (1.626 m)     Head Circumference --  Peak Flow --      Pain Score 06/22/20 2009 9     Pain Loc --      Pain Edu? --      Excl. in [place]? --    Updated Vital Signs BP (!) 155/97 (BP Location: Right Arm)   Pulse 77   Temp 98 F (36.7 C) (Oral)   Resp 18   Ht 5\' 4"  (1.626 m)   Wt 98.9 kg   SpO2 98%   BMI 37.42 kg/m   Visual Acuity Right Eye Distance:   Left Eye Distance:   Bilateral Distance:    Right Eye Near:   Left Eye Near:    Bilateral Near:     Physical Exam Constitutional:      General: She is not in acute distress.    Appearance: Normal appearance. She is obese. She is not ill-appearing.  HENT:     Head: Normocephalic and atraumatic.  Eyes:     General:        Right eye: No discharge.        Left eye: No discharge.     Conjunctiva/sclera: Conjunctivae normal.  Pulmonary:     Effort: Pulmonary effort is normal. No respiratory distress.  Musculoskeletal:     Comments: Left foot -mild tenderness around the fifth metatarsal base.  Pes planus noted.  Neurological:     Mental Status: She is alert.  Psychiatric:        Mood and Affect: Mood normal.        Behavior: Behavior normal.    UC Treatments / Results  Labs (all labs ordered are listed, but only abnormal results are displayed) Labs Reviewed - No data to display  EKG   Radiology No results found.  Procedures Procedures (including critical care time)  Medications Ordered in UC Medications - No data to display  Initial Impression / Assessment and Plan / UC Course  I have reviewed the triage vital signs and the nursing notes.  Pertinent labs & imaging results that were available during my care of the patient were reviewed by me  and considered in my medical decision making (see chart for details).    75 year old female presents with foot pain. Exam notable for pes planus.  No indications for imaging.  Diclofenac as prescribed.  Advised to see podiatry.  Final Clinical Impressions(s) / UC Diagnoses   Final diagnoses:  Left foot pain     Discharge Instructions     Good arch support.  Medication as directed.  If persists, see podiatry.   [facility]    ED Prescriptions    Medication Sig Dispense Auth. Provider   diclofenac (VOLTAREN) 75 MG EC tablet Take 1 tablet (75 mg total) by mouth 2 (two) times daily as needed for moderate pain. 30 tablet Coral Spikes, DO     PDMP not reviewed this encounter.   Thersa Salt [place], [place] 06/23/20 [phone]

## 2020-06-22 NOTE — Discharge Instructions (Signed)
Good arch support.  Medication as directed.  If persists, see podiatry.   [facility]

## 2020-06-24 ENCOUNTER — Inpatient Hospital Stay: Payer: Medicare Other | Attending: Hematology and Oncology

## 2020-06-24 ENCOUNTER — Other Ambulatory Visit: Payer: Self-pay

## 2020-06-24 ENCOUNTER — Inpatient Hospital Stay: Payer: Medicare Other

## 2020-06-24 VITALS — BP 139/92 | HR 76 | Temp 96.0°F | Resp 18

## 2020-06-24 DIAGNOSIS — M810 Age-related osteoporosis without current pathological fracture: Secondary | ICD-10-CM | POA: Diagnosis not present

## 2020-06-24 DIAGNOSIS — Z8572 Personal history of non-Hodgkin lymphomas: Secondary | ICD-10-CM | POA: Insufficient documentation

## 2020-06-24 DIAGNOSIS — Z853 Personal history of malignant neoplasm of breast: Secondary | ICD-10-CM | POA: Diagnosis not present

## 2020-06-24 DIAGNOSIS — C8193 Hodgkin lymphoma, unspecified, intra-abdominal lymph nodes: Secondary | ICD-10-CM

## 2020-06-24 LAB — BASIC METABOLIC PANEL
Anion gap: 10 (ref 5–15)
BUN: 19 mg/dL (ref 8–23)
CO2: 25 mmol/L (ref 22–32)
Calcium: 8.9 mg/dL (ref 8.9–10.3)
Chloride: 100 mmol/L (ref 98–111)
Creatinine, Ser: 0.75 mg/dL (ref 0.44–1.00)
GFR calc Af Amer: >60 mL/min (ref >60)
GFR calc non Af Amer: >60 mL/min (ref >60)
Glucose, Bld: 101 mg/dL — ABNORMAL HIGH (ref 70–99)
Potassium: 4 mmol/L (ref 3.5–5.1)
Sodium: 135 mmol/L (ref 135–145)

## 2020-06-24 MED ORDER — DENOSUMAB 60 MG/ML ~~LOC~~ SOSY
60.0000 mg | PREFILLED_SYRINGE | Freq: Once | SUBCUTANEOUS | Status: AC
  Administered 2020-06-24: 60 mg via SUBCUTANEOUS

## 2020-06-25 ENCOUNTER — Ambulatory Visit: Payer: Medicare Other

## 2020-06-25 ENCOUNTER — Other Ambulatory Visit: Payer: Medicare Other

## 2020-07-06 ENCOUNTER — Other Ambulatory Visit: Payer: Self-pay

## 2020-07-06 ENCOUNTER — Ambulatory Visit (INDEPENDENT_AMBULATORY_CARE_PROVIDER_SITE_OTHER): Payer: Medicare Other | Admitting: Family Medicine

## 2020-07-06 ENCOUNTER — Encounter: Payer: Self-pay | Admitting: Family Medicine

## 2020-07-06 DIAGNOSIS — R739 Hyperglycemia, unspecified: Secondary | ICD-10-CM | POA: Diagnosis not present

## 2020-07-06 DIAGNOSIS — C8193 Hodgkin lymphoma, unspecified, intra-abdominal lymph nodes: Secondary | ICD-10-CM | POA: Diagnosis not present

## 2020-07-06 DIAGNOSIS — M79672 Pain in left foot: Secondary | ICD-10-CM | POA: Diagnosis not present

## 2020-07-06 DIAGNOSIS — Z23 Encounter for immunization: Secondary | ICD-10-CM

## 2020-07-06 DIAGNOSIS — Z1231 Encounter for screening mammogram for malignant neoplasm of breast: Secondary | ICD-10-CM | POA: Diagnosis not present

## 2020-07-06 DIAGNOSIS — I10 Essential (primary) hypertension: Secondary | ICD-10-CM | POA: Diagnosis not present

## 2020-07-06 DIAGNOSIS — M810 Age-related osteoporosis without current pathological fracture: Secondary | ICD-10-CM | POA: Diagnosis not present

## 2020-07-06 DIAGNOSIS — R7303 Prediabetes: Secondary | ICD-10-CM | POA: Diagnosis not present

## 2020-07-06 DIAGNOSIS — K219 Gastro-esophageal reflux disease without esophagitis: Secondary | ICD-10-CM | POA: Diagnosis not present

## 2020-07-06 DIAGNOSIS — M170 Bilateral primary osteoarthritis of knee: Secondary | ICD-10-CM | POA: Diagnosis not present

## 2020-07-06 DIAGNOSIS — F341 Dysthymic disorder: Secondary | ICD-10-CM

## 2020-07-06 LAB — POCT GLYCOSYLATED HEMOGLOBIN (HGB A1C): Hemoglobin A1C: 5.5 % (ref 4.0–5.6)

## 2020-07-06 MED ORDER — PANTOPRAZOLE SODIUM 40 MG PO TBEC: 40.0000 mg | DELAYED_RELEASE_TABLET | Freq: Every day | ORAL | 1 refills | Status: DC

## 2020-07-06 MED ORDER — CITALOPRAM HYDROBROMIDE 20 MG PO TABS: 20.0000 mg | ORAL_TABLET | Freq: Every day | ORAL | 1 refills | Status: DC

## 2020-07-06 MED ORDER — AMLODIPINE BESYLATE-VALSARTAN 5-160 MG PO TABS: 1.0000 | ORAL_TABLET | Freq: Every day | ORAL | 1 refills | Status: DC

## 2020-07-06 MED ORDER — METOPROLOL SUCCINATE ER 50 MG PO TB24: 50.0000 mg | ORAL_TABLET | Freq: Every day | ORAL | 1 refills | Status: DC

## 2020-07-06 NOTE — Progress Notes (Signed)
Name: Mary Todd   MRN: [id number]    DOB: [birth year]   Date:07/06/2020       Progress Note  Subjective  Chief Complaint  Follow up   HPI   Pre-diabetes: she denies polyphagia, polydipsia or polyuria, she has been off  Metformin since recall in 2020 , last A1C was 5.6% , we will recheck labs today   Urge incontinence:. [facility] Urologist Summer  2020 and was given Myrbetriq , but it did not decrease nighttime symptoms, switched to Ditropan and after that was on Detrol. She is still on Detrol and also had  post tibial neuro stimulation needle electrode therapy monthly for maintenance now and is 85 % better. She states nocturia down to once per night.   Left lateral foot pain: she went to [facility] a couple of weeks ago for evaluation and was told to take Aleve, she was already taking meloxicam and tylenol without help. Pain is intermittent , started about 4-6 weeks ago She states pain is sharp and intense, can happen during rest or activity, lasts seconds to at most a minute, however the day she went to [facility] episodes were more frequent, no redness or swelling, no injuries that she can recall. She is ready to see a podiatrist   HTN: she is taking  Exforge 5/160 and also metoprolol, tolerating medication well, no chest pain or palpitation, bp was elevated when she first come in today, she states at home has been well controlled around  120-130's/80-90's    GERD:she tried stopping Pantoprazole but as soon as she went off medication symptoms became severe, with heartburn  and regurgitation, andhas been taking it daily again.Regurgitation has resolved   OA: she had TKR right knee on Dec 4 th 2020 by Dr. Harlow Mares , she completed  PT and is doing well, she denies any pain, normal extension but has a catch when flexing at times.   RightBreast Cancer/hodgkins lymphoma : under the care of Dr. Mike Gip she has been released from her surgeon. . Also diagnosed with hodgkin's lymphoma in 2004, breast  cancer diagnosed in 2011still on Arimidex , she is almost at 10 year mark post-treatment   Osteoporosis: she is on Prolia,  found on bone density of spine, she is tolerating medications well, she gets the infusion at the cancer center   Thyroid nodules: incidental finding on CT neck , had biopsy by Dr. Gabriel Carina Dec 2017 and biopsy was negative, still has yearly follow ups with her. Lat TSH October 2020 was normal at 0.60, she has been released   Obesity: she has long history of obesity, she isoffMetformin,weight is stable when compared to 08/2019 , up 3 lbs.   Dysthymia: she was seen back in April 2021 and  had noticed a lack of motivation, she was sitting  around all day, she has been watching her great niece and nephew five days a week ,  She states it is a lot of work but she is glad that she can do it, she watches them in the afternoon.  She states her father died 27-May-2019, she was the primary caregiver for one year, she also states he lived across the street from her and she was always looking outside the window, but she is not doing that as often. She started on Citalopram and states mood is better, she states her husband has retired and he helps out but at the same time it causes conflict at home.    Patient Active Problem List  Diagnosis Date Noted  . Total knee replacement status, right 10/03/2019  . Encounter for screening mammogram for breast cancer 07/02/2018  . Anterolisthesis 08/06/2017  . DDD (degenerative disc disease), lumbosacral 08/06/2017  . Chondromalacia patellae 07/03/2017  . Osteoporosis 12/14/2016  . Morbid obesity ([place]) 07/17/2016  . Spinal stenosis in cervical region 05/22/2016  . Thyroid nodule 05/22/2016  . GERD without esophagitis 03/16/2016  . Neck muscle spasm 03/16/2016  . History of back surgery 03/16/2016  . Osteoarthritis of both knees 03/16/2016  . Large breasts 03/16/2016  . Hypertension, benign 03/16/2016  . Perennial allergic rhinitis with  seasonal variation 03/16/2016  . History of pneumococcal pneumonia 09/20/2015  . Hodgkin disease ([place]) 07/15/2014  . Breast cancer, right ([place]) 06/26/2013    Past Surgical History:  Procedure Laterality Date  . ABDOMINAL HYSTERECTOMY    . BACK SURGERY    . BREAST BIOPSY Right 2011   radiation  . BREAST LUMPECTOMY Right 2011   w/ radiation  . BREAST SURGERY Right 2011   right breast lumpectomy,L/SN/R for right breast CA   . CATARACT EXTRACTION W/PHACO Left 06/10/2018   Procedure: CATARACT EXTRACTION PHACO AND INTRAOCULAR LENS PLACEMENT ([place]) LEFT;  Surgeon: Eulogio Bear, MD;  Location: [place];  Service: Ophthalmology;  Laterality: Left;  Diabetic - oral meds  . CATARACT EXTRACTION W/PHACO Right 07/15/2018   Procedure: CATARACT EXTRACTION PHACO AND INTRAOCULAR LENS PLACEMENT ([place]) RIGHT;  Surgeon: Eulogio Bear, MD;  Location: [place];  Service: Ophthalmology;  Laterality: Right;  . COLONOSCOPY  2003  . PORT A CATH REVISION    . REPLACEMENT TOTAL KNEE Right 10/03/2019   DR. Harlow Mares   . WRIST SURGERY  2005    Family History  Problem Relation Age of Onset  . Cancer Father   . Bone cancer Father   . Breast cancer Mother 71  . Stroke Mother   . Colon cancer Maternal Grandmother   . Breast cancer Cousin     Social History   Tobacco Use  . Smoking status: Never Smoker  . Smokeless tobacco: Never Used  . Tobacco comment: smoking cessation materials not required  Substance Use Topics  . Alcohol use: Yes    Alcohol/week: 2.0 standard drinks    Types: 2 Glasses of wine per week    Comment: occassionally     Current Outpatient Medications:  .  acetaminophen (TYLENOL) 650 MG CR tablet, Take 1,300 mg by mouth every 8 (eight) hours as needed for pain., Disp: , Rfl:  .  amLODipine-valsartan (EXFORGE) 5-160 MG tablet, Take 1 tablet by mouth daily., Disp: 90 tablet, Rfl: 1 .  anastrozole (ARIMIDEX) 1 MG tablet, Take 1 tablet by mouth once daily,  Disp: 90 tablet, Rfl: 0 .  aspirin 81 MG tablet, Take 81 mg by mouth daily., Disp: , Rfl:  .  CALCIUM-MAGNESIUM PO, Take 1 tablet by mouth daily., Disp: , Rfl:  .  cetirizine (ZYRTEC) 10 MG tablet, Take 10 mg by mouth daily., Disp: , Rfl:  .  Cholecalciferol (VITAMIN D-3 PO), Take 1 tablet by mouth daily., Disp: , Rfl:  .  citalopram (CELEXA) 20 MG tablet, Take 1 tablet (20 mg total) by mouth daily., Disp: 90 tablet, Rfl: 0 .  denosumab (PROLIA) 60 MG/ML SOSY injection, Inject 60 mg into the skin every 6 (six) months., Disp: , Rfl:  .  methocarbamol (ROBAXIN) 750 MG tablet, Take 750 mg by mouth 3 (three) times daily., Disp: , Rfl:  .  metoprolol succinate (  TOPROL-XL) 50 MG 24 hr tablet, Take 1 tablet (50 mg total) by mouth daily. Take with or immediately following a meal., Disp: 90 tablet, Rfl: 1 .  Multiple Vitamin (MULTIVITAMIN) tablet, Take 1 tablet by mouth daily., Disp: , Rfl:  .  nystatin (MYCOSTATIN/NYSTOP) powder, Apply topically 2 (two) times daily., Disp: , Rfl:  .  pantoprazole (PROTONIX) 40 MG tablet, Take 1 tablet (40 mg total) by mouth daily., Disp: 90 tablet, Rfl: 1 .  tolterodine (DETROL LA) 4 MG 24 hr capsule, Take 4 mg by mouth daily., Disp: , Rfl:  .  diclofenac (VOLTAREN) 75 MG EC tablet, Take 1 tablet (75 mg total) by mouth 2 (two) times daily as needed for moderate pain. (Patient not taking: Reported on 07/06/2020), Disp: 30 tablet, Rfl: 0  Allergies  Allergen Reactions  . Shrimp [Shellfish Allergy] Hives  . Augmentin [Amoxicillin-Pot Clavulanate] Nausea Only    I personally reviewed active problem list, medication list, allergies, family history, social history, health maintenance with the patient/caregiver today.   ROS  Constitutional: Negative for fever or weight change.  Respiratory: Negative for cough and shortness of breath.   Cardiovascular: Negative for chest pain or palpitations.  Gastrointestinal: Negative for abdominal pain, no bowel changes.   Musculoskeletal: Negative for gait problem or joint swelling.  Skin: Negative for rash.  Neurological: Negative for dizziness or headache.  No other specific complaints in a complete review of systems (except as listed in HPI above).  Objective  Vitals:   07/06/20 1333  BP: (!) 160/100  Pulse: 90  Resp: 16  Temp: 98.9 F (37.2 C)  TempSrc: Oral  SpO2: 98%  Weight: 223 lb (101.2 kg)  Height: 5\' 3"  (1.6 m)    Body mass index is 39.5 kg/m.  Physical Exam  Constitutional: Patient appears well-developed and well-nourished. Obese  No distress.  HEENT: head atraumatic, normocephalic, pupils equal and reactive to light, neck supple Cardiovascular: Normal rate, regular rhythm and normal heart sounds.  No murmur heard. No BLE edema. Pulmonary/Chest: Effort normal and breath sounds normal. No respiratory distress. Abdominal: Soft.  There is no tenderness. Muscular Skeletal: decrease in flexion of right knee, crepitus with extension of left knee,  no redness or increase in warmth , pain during palpation of 3rd and 4 th metatarsal bones on left  Psychiatric: Patient has a normal mood and affect. behavior is normal. Judgment and thought content normal.  Recent Results (from the past 2160 hour(s))  Basic metabolic panel     Status: Abnormal   Collection Time: 06/24/20 12:59 PM  Result Value Ref Range   Sodium 135 135 - 145 mmol/L   Potassium 4.0 3.5 - 5.1 mmol/L   Chloride 100 98 - 111 mmol/L   CO2 25 22 - 32 mmol/L   Glucose, Bld 101 (H) 70 - 99 mg/dL    Comment: Glucose reference range applies only to samples taken after fasting for at least 8 hours.   BUN 19 8 - 23 mg/dL   Creatinine, Ser 0.75 0.44 - 1.00 mg/dL   Calcium 8.9 8.9 - 10.3 mg/dL   GFR calc non Af Amer >60 >60 mL/min   GFR calc Af Amer >60 >60 mL/min   Anion gap 10 5 - 15    Comment: Performed at [facility], [address], [place], [place] [address]     PHQ2/9: Depression screen [facility] 2/9 07/06/2020  04/13/2020 03/02/2020 01/01/2020 11/03/2019  Decreased Interest 0 0 1 0 0  Down, Depressed,  Hopeless 0 0 1 0 0  PHQ - 2 Score 0 0 2 0 0  Altered sleeping - 0 1 - 0  Tired, decreased energy - 0 2 - 0  Change in appetite - 0 1 - 0  Feeling bad or failure about yourself  - 0 1 - 0  Trouble concentrating - 0 0 - 0  Moving slowly or fidgety/restless - 0 0 - 0  Suicidal thoughts - 0 0 - 0  PHQ-9 Score - 0 7 - 0  Difficult doing work/chores - - Somewhat difficult - -  Some recent data might be hidden    phq 9 is negative   Fall Risk: Fall Risk  07/06/2020 04/13/2020 03/02/2020 01/01/2020 11/03/2019  Falls in the past year? 0 0 0 0 0  Number falls in past yr: 0 0 0 0 0  Injury with Fall? 0 - 0 0 0  Risk for fall due to : - - - Orthopedic patient -  Follow up - - Falls evaluation completed Falls prevention discussed -    Assessment & Plan  1. Morbid obesity ([place])  Discussed with the patient the risk posed by an increased BMI. Discussed importance of portion control, calorie counting and at least 150 minutes of physical activity weekly. Avoid sweet beverages and drink more water. Eat at least 6 servings of fruit and vegetables daily   2. Essential hypertension  - amLODipine-valsartan (EXFORGE) 5-160 MG tablet; Take 1 tablet by mouth daily.  Dispense: 90 tablet; Refill: 1 - metoprolol succinate (TOPROL-XL) 50 MG 24 hr tablet; Take 1 tablet (50 mg total) by mouth daily. Take with or immediately following a meal.  Dispense: 90 tablet; Refill: 1  3. Pre-diabetes   4. Primary osteoarthritis of both knees   5. GERD without esophagitis  - pantoprazole (PROTONIX) 40 MG tablet; Take 1 tablet (40 mg total) by mouth daily.  Dispense: 90 tablet; Refill: 1  6. Age-related osteoporosis without current pathological fracture   7. Hodgkin lymphoma of intra-abdominal lymph nodes, unspecified Hodgkin lymphoma type ([place])   8. Need for immunization against influenza  - Flu Vaccine QUAD High  Dose(Fluad)  9. Foot pain, left  - Ambulatory referral to Podiatry  10. Breast cancer screening by mammogram   11. Hyperglycemia  - POCT HgB A1C  12. Dysthymia  - citalopram (CELEXA) 20 MG tablet; Take 1 tablet (20 mg total) by mouth daily.  Dispense: 90 tablet; Refill: 1

## 2020-07-13 ENCOUNTER — Ambulatory Visit
Admission: RE | Admit: 2020-07-13 | Discharge: 2020-07-13 | Disposition: A | Discharge status: Home / Self Care | Payer: Medicare Other | Source: Ambulatory Visit | Attending: Hematology and Oncology | Admitting: Hematology and Oncology

## 2020-07-13 ENCOUNTER — Other Ambulatory Visit: Payer: Self-pay

## 2020-07-13 DIAGNOSIS — Z1231 Encounter for screening mammogram for malignant neoplasm of breast: Secondary | ICD-10-CM | POA: Insufficient documentation

## 2020-07-13 DIAGNOSIS — C50911 Malignant neoplasm of unspecified site of right female breast: Secondary | ICD-10-CM | POA: Diagnosis not present

## 2020-07-21 ENCOUNTER — Ambulatory Visit: Payer: Medicare Other | Admitting: Podiatry

## 2020-07-23 ENCOUNTER — Ambulatory Visit (INDEPENDENT_AMBULATORY_CARE_PROVIDER_SITE_OTHER): Payer: Medicare Other | Admitting: Podiatry

## 2020-07-23 ENCOUNTER — Ambulatory Visit (INDEPENDENT_AMBULATORY_CARE_PROVIDER_SITE_OTHER): Payer: Medicare Other

## 2020-07-23 ENCOUNTER — Other Ambulatory Visit: Payer: Self-pay | Admitting: Podiatry

## 2020-07-23 ENCOUNTER — Encounter: Payer: Self-pay | Admitting: Podiatry

## 2020-07-23 ENCOUNTER — Other Ambulatory Visit: Payer: Self-pay

## 2020-07-23 DIAGNOSIS — M778 Other enthesopathies, not elsewhere classified: Secondary | ICD-10-CM | POA: Diagnosis not present

## 2020-07-23 DIAGNOSIS — G629 Polyneuropathy, unspecified: Secondary | ICD-10-CM

## 2020-07-23 MED ORDER — GABAPENTIN 100 MG PO CAPS: 100.0000 mg | ORAL_CAPSULE | Freq: Every day | ORAL | 2 refills | Status: DC

## 2020-07-23 NOTE — Progress Notes (Signed)
   HPI: 74 y.o. female presenting today as a new patient for evaluation of 2 specific complaints.  Patient states that over the past several years she has had this sensation in the evenings while she is sleeping that the inside of her feet are itching.  She has tried lots of topical OTC creams and seeing different doctors with minimal relief.  She states that in the evening sometimes it wakes her up at night the sensation that she has. Patient also complains of sharp shooting pain to the left lateral midfoot.  She experiences the pain almost daily.  There are no aggravating or alleviating factors.  She has worn good supportive shoes with minimal relief.  She presents for further treatment and evaluation  Past Medical History:  Diagnosis Date  . Allergy   . Arthritis    knees, lower back  . Breast cancer ([place]) right   2011, T1A N0, radiation  . Esophageal reflux   . Hodgkin's disease ([place]) 2004  . Hypertension 2006  . Osteoporosis   . Personal history of malignant neoplasm of breast 2011   right breast  . Personal history of radiation therapy 2011   RIGHT lumpectomy w/ radiation     Physical Exam: General: The patient is alert and oriented x3 in no acute distress.  Dermatology: Skin is warm, dry and supple bilateral lower extremities. Negative for open lesions or macerations.  Vascular: Palpable pedal pulses bilaterally. No edema or erythema noted. Capillary refill within normal limits.  Neurological: Epicritic and protective threshold grossly intact bilaterally.   Musculoskeletal Exam: Range of motion within normal limits to all pedal and ankle joints bilateral. Muscle strength 5/5 in all groups bilateral.  There is some pain on palpation overlying the left lateral midfoot consistent with findings of arthritis/DJD  Radiographic Exam:  Normal osseous mineralization. No fracture/dislocation/boney destruction.  There is some diffuse joint space narrowing and evidence of some  osteoarthritis throughout the pedal joints of the foot  Assessment: 1.  Capsulitis/DJD left lateral midfoot 2.  Suspected nocturnal neuropathy   Plan of Care:  1. Patient evaluated. X-Rays reviewed.  2.  Injection of 0.5 cc Celestone Soluspan injected into the left lateral midfoot 3.  Prescription for gabapentin 100 mg nightly to see if it alleviates the suspected neuropathy/"internal itching" within the feet 4.  Continue wearing good supportive shoes 5.  Return to clinic in 4 weeks    Edrick Kins, DPM [facility]  Dr. Edrick Kins, DPM    [address] [place], [place] [address]                Office [phone]  Fax [phone]

## 2020-07-25 NOTE — Progress Notes (Signed)
PTNS  Session # monthly   Health & Social Factors: no change Caffeine: 1 Alcohol: 1 Daytime voids #per day: 4-5 Night-time voids #per night: 1 Urgency: mild Incontinence Episodes #per day: 0 Ankle used: right Treatment Setting: 1 Feeling/ Response: both Comments: patient tolerated well  Performed By: Edwin Dada, CMA  Assistant:   Follow Up: monthly

## 2020-07-26 ENCOUNTER — Ambulatory Visit (INDEPENDENT_AMBULATORY_CARE_PROVIDER_SITE_OTHER): Payer: Medicare Other | Admitting: Urology

## 2020-07-26 ENCOUNTER — Other Ambulatory Visit: Payer: Self-pay

## 2020-07-26 DIAGNOSIS — R351 Nocturia: Secondary | ICD-10-CM | POA: Diagnosis not present

## 2020-08-03 ENCOUNTER — Encounter: Payer: Self-pay | Admitting: Family Medicine

## 2020-08-04 ENCOUNTER — Other Ambulatory Visit: Payer: Self-pay

## 2020-08-04 DIAGNOSIS — N3946 Mixed incontinence: Secondary | ICD-10-CM

## 2020-08-04 DIAGNOSIS — R351 Nocturia: Secondary | ICD-10-CM

## 2020-08-04 MED ORDER — TOLTERODINE TARTRATE ER 4 MG PO CP24: 4.0000 mg | ORAL_CAPSULE | Freq: Every day | ORAL | 11 refills | Status: DC

## 2020-08-11 ENCOUNTER — Other Ambulatory Visit: Payer: Self-pay | Admitting: Hematology and Oncology

## 2020-08-11 DIAGNOSIS — Z853 Personal history of malignant neoplasm of breast: Secondary | ICD-10-CM

## 2020-08-16 DIAGNOSIS — H318 Other specified disorders of choroid: Secondary | ICD-10-CM | POA: Diagnosis not present

## 2020-08-23 ENCOUNTER — Ambulatory Visit (INDEPENDENT_AMBULATORY_CARE_PROVIDER_SITE_OTHER): Payer: Medicare Other | Admitting: Urology

## 2020-08-23 DIAGNOSIS — R350 Frequency of micturition: Secondary | ICD-10-CM

## 2020-08-23 NOTE — Progress Notes (Signed)
PTNS  Session # Monthly   Health & Social Factors: No change Caffeine: Coffee Alcohol: Wine - sometimes Daytime voids #per day: 5 Night-time voids #per night: 1-2 Urgency: mild Incontinence Episodes #per day: None Ankle used: Right Treatment Setting: 2 Feeling/ Response: Sensory  Comments: Patient tolerated the procedure  Performed By: Zara Council, PA-C  Follow Up: One month for maintenance PTNS

## 2020-08-24 ENCOUNTER — Other Ambulatory Visit: Payer: Self-pay

## 2020-08-24 ENCOUNTER — Ambulatory Visit (INDEPENDENT_AMBULATORY_CARE_PROVIDER_SITE_OTHER): Payer: Medicare Other | Admitting: Podiatry

## 2020-08-24 DIAGNOSIS — G629 Polyneuropathy, unspecified: Secondary | ICD-10-CM | POA: Diagnosis not present

## 2020-08-24 DIAGNOSIS — M778 Other enthesopathies, not elsewhere classified: Secondary | ICD-10-CM | POA: Diagnosis not present

## 2020-08-24 MED ORDER — GABAPENTIN 100 MG PO CAPS
100.0000 mg | ORAL_CAPSULE | Freq: Every day | ORAL | 2 refills | Status: DC
Start: 2020-08-24 — End: 2021-09-12

## 2020-08-24 NOTE — Progress Notes (Signed)
   HPI: 74 y.o. female presenting today for follow-up evaluation of capsulitis to the left foot as well as nocturnal neuropathy.  Patient states that the gabapentin at nighttime helped significantly with her restless legs and the itching sensation.  She also states that the injection helped significantly alleviate her left foot pain however it is slowly recurred over the last week.  Past Medical History:  Diagnosis Date  . Allergy   . Arthritis    knees, lower back  . Breast cancer ([place]) right   2011, T1A N0, radiation  . Esophageal reflux   . Hodgkin's disease ([place]) 2004  . Hypertension 2006  . Osteoporosis   . Personal history of malignant neoplasm of breast 2011   right breast  . Personal history of radiation therapy 2011   RIGHT lumpectomy w/ radiation     Physical Exam: General: The patient is alert and oriented x3 in no acute distress.  Dermatology: Skin is warm, dry and supple bilateral lower extremities. Negative for open lesions or macerations.  Vascular: Palpable pedal pulses bilaterally. No edema or erythema noted. Capillary refill within normal limits.  Neurological: Epicritic and protective threshold grossly intact bilaterally.   Musculoskeletal Exam: Range of motion within normal limits to all pedal and ankle joints bilateral. Muscle strength 5/5 in all groups bilateral.  There is some pain on palpation overlying the left lateral midfoot consistent with findings of arthritis/DJD  Assessment: 1.  Capsulitis/DJD left lateral midfoot 2.  Suspected nocturnal neuropathy   Plan of Care:  1. Patient evaluated. X-Rays reviewed.  2.  Injection of 0.5 cc Celestone Soluspan injected into the left lateral midfoot 3.  Continue gabapentin 100 mg nightly to alleviate the neuropathy/"internal itching" within the feet 4.  Patient currently takes meloxicam 15 mg daily from her PCP.  Continue. 5.  Continue wearing good supportive shoes 6.  Return to clinic 4 weeks   Edrick Kins, DPM [facility]  Dr. Edrick Kins, DPM    [address], [place] [address]                Office [phone]  Fax [phone]

## 2020-08-31 DIAGNOSIS — Z23 Encounter for immunization: Secondary | ICD-10-CM | POA: Diagnosis not present

## 2020-09-21 ENCOUNTER — Ambulatory Visit: Payer: Medicare Other | Admitting: Podiatry

## 2020-09-26 NOTE — Progress Notes (Signed)
PTNS  Session # Monthly Maintenance    Health & Social Factors: no change Caffeine: 10oz Alcohol: 1 Daytime voids #per day: 4-5 Night-time voids #per night: 1 Urgency: mild Incontinence Episodes #per day: 0 Ankle used: left Treatment Setting: 3 Feeling/ Response: sensory Comments: patient tolerated well  Performed By: Edwin Dada, CMA  Follow Up: 1 month

## 2020-09-27 ENCOUNTER — Encounter: Payer: Self-pay | Admitting: Urology

## 2020-09-27 ENCOUNTER — Ambulatory Visit (INDEPENDENT_AMBULATORY_CARE_PROVIDER_SITE_OTHER): Payer: Medicare Other | Admitting: Urology

## 2020-09-27 DIAGNOSIS — R350 Frequency of micturition: Secondary | ICD-10-CM

## 2020-10-07 ENCOUNTER — Other Ambulatory Visit: Payer: Self-pay | Admitting: Hematology and Oncology

## 2020-10-07 DIAGNOSIS — Z853 Personal history of malignant neoplasm of breast: Secondary | ICD-10-CM

## 2020-10-12 DIAGNOSIS — Z471 Aftercare following joint replacement surgery: Secondary | ICD-10-CM | POA: Diagnosis not present

## 2020-10-12 DIAGNOSIS — Z96651 Presence of right artificial knee joint: Secondary | ICD-10-CM | POA: Diagnosis not present

## 2020-11-01 ENCOUNTER — Ambulatory Visit: Payer: Medicare Other | Admitting: Urology

## 2020-11-01 NOTE — Progress Notes (Deleted)
PTNS  Session # Monthly Maintainence  Health & Social Factors: *** Caffeine: *** Alcohol: *** Daytime voids #per day: *** Night-time voids #per night: *** Urgency: *** Incontinence Episodes #per day: *** Ankle used: *** Treatment Setting: *** Feeling/ Response: *** Comments: ***  Performed By: ***  Assistant: ***  Follow Up: ***

## 2020-11-07 NOTE — Progress Notes (Unsigned)
PTNS  Session # Monthly Maintainence  Health & Social Factors: No change Caffeine: 10 ounce Alcohol: occassionally wine Daytime voids #per day: 4-5 Night-time voids #per night: 1 Urgency: Strong Incontinence Episodes #per day: 0 Ankle used: Left Treatment Setting: 9 Feeling/ Response: Sensory Comments: Patient tolerated the procedure well  Performed By: Zara Council, PA-C  Assistant: Londell Moh, LPN  Follow Up: One month for maintenance

## 2020-11-08 ENCOUNTER — Ambulatory Visit (INDEPENDENT_AMBULATORY_CARE_PROVIDER_SITE_OTHER): Payer: Medicare Other | Admitting: Urology

## 2020-11-08 ENCOUNTER — Encounter: Payer: Self-pay | Admitting: Urology

## 2020-11-08 VITALS — BP 149/90 | HR 80 | Temp 98.2°F | Ht 63.0 in | Wt 223.0 lb

## 2020-11-08 DIAGNOSIS — R350 Frequency of micturition: Secondary | ICD-10-CM | POA: Diagnosis not present

## 2020-11-29 ENCOUNTER — Other Ambulatory Visit: Payer: Self-pay | Admitting: Family Medicine

## 2020-11-29 DIAGNOSIS — M170 Bilateral primary osteoarthritis of knee: Secondary | ICD-10-CM

## 2020-12-05 ENCOUNTER — Other Ambulatory Visit: Payer: Self-pay | Admitting: Family Medicine

## 2020-12-05 DIAGNOSIS — M170 Bilateral primary osteoarthritis of knee: Secondary | ICD-10-CM

## 2020-12-07 ENCOUNTER — Telehealth: Payer: Self-pay

## 2020-12-07 NOTE — Telephone Encounter (Signed)
Copied from [place] [phone]. Topic: General - Other >> Dec 07, 2020  4:01 PM Tessa Lerner A wrote: Reason for CRM: Patient has made contact requesting an update on the status of prescriptions requested to be refilled Patient's initial request has gone beyond 72 hours and patient is now seeking clarification from staff

## 2020-12-08 ENCOUNTER — Other Ambulatory Visit: Payer: Self-pay

## 2020-12-08 ENCOUNTER — Other Ambulatory Visit: Payer: Self-pay | Admitting: Family Medicine

## 2020-12-08 MED ORDER — MELOXICAM 15 MG PO TABS: 15.0000 mg | ORAL_TABLET | Freq: Every day | ORAL | 0 refills | Status: DC

## 2020-12-09 NOTE — Telephone Encounter (Signed)
Pt reports not taking diclofenac

## 2020-12-10 NOTE — Progress Notes (Signed)
Error

## 2020-12-13 ENCOUNTER — Ambulatory Visit (INDEPENDENT_AMBULATORY_CARE_PROVIDER_SITE_OTHER): Payer: Medicare Other | Admitting: Urology

## 2020-12-13 DIAGNOSIS — N3946 Mixed incontinence: Secondary | ICD-10-CM

## 2020-12-13 NOTE — Progress Notes (Signed)
Patient ID: Mary Todd, female   DOB: Oct 13, 1946, 75 y.o.   MRN: [id number] PTNS  Session # [facility] & Social Factors: no change Caffeine: 0 Alcohol: 0 Daytime voids #per day: 3 Night-time voids #per night: 1-2 Urgency: milf Incontinence Episodes #per day: 1 Ankle used: left  Treatment Setting: 4 Feeling/ Response: both Comments: pt tolerated well  Performed By: Edwin Dada, CMA  Assistant:   Follow Up: monthly

## 2020-12-23 NOTE — Progress Notes (Signed)
[facility]  [address], [address] [place], [place] [address] Phone: [phone]  Fax: [phone]   Clinic Day:  12/27/2020  Referring physician: Steele Sizer, MD  Chief Complaint: Mary Todd is a 75 y.o. female with stage II Hodgkin's disease (2004) and stage IA right breast cancer (2011) who is seen for 1 year assessment.    HPI: The patient was last seen in the medical oncology clinic on 12/26/2019. At that time, she denied any breast concerns.  She denied any B symptoms.  Exam revealed no adenopathy or hepatosplenomegaly. Hematocrit was 38.1, hemoglobin 12.6, platelets 236,000, WBC 6,200.  CA27.29 was 7.0. She continued Arimidex, calcium, and vitamin D. She received Prolia.  BMP on 06/24/2020 was normal. She received Prolia.  Bilateral screening mammogram on 07/13/2020 revealed no evidence of malignancy.  During the interim, she has been good. The patient performs monthly breast self exams. She reports sweating underneath her breasts. She uses Nystatin powder that was prescribed by her dermatologist but she has run out. She denies fevers, sweats, lumps, and bumps.  She is scheduled to received Prolia today. She denies any dental concerns.  She has been on anastrozole for 10 years; will discontinue.   Past Medical History:  Diagnosis Date  . Allergy   . Arthritis    knees, lower back  . Breast cancer ([place]) right   2011, T1A N0, radiation  . Esophageal reflux   . Hodgkin's disease ([place]) 2004  . Hypertension 2006  . Osteoporosis   . Personal history of malignant neoplasm of breast 2011   right breast  . Personal history of radiation therapy 2011   RIGHT lumpectomy w/ radiation    Past Surgical History:  Procedure Laterality Date  . ABDOMINAL HYSTERECTOMY    . BACK SURGERY    . BREAST BIOPSY Right 2011   radiation  . BREAST LUMPECTOMY Right 2011   w/ radiation  . BREAST SURGERY Right 2011   right breast lumpectomy,L/SN/R for  right breast CA   . CATARACT EXTRACTION W/PHACO Left 06/10/2018   Procedure: CATARACT EXTRACTION PHACO AND INTRAOCULAR LENS PLACEMENT ([place]) LEFT;  Surgeon: Eulogio Bear, MD;  Location: [facility];  Service: Ophthalmology;  Laterality: Left;  Diabetic - oral meds  . CATARACT EXTRACTION W/PHACO Right 07/15/2018   Procedure: CATARACT EXTRACTION PHACO AND INTRAOCULAR LENS PLACEMENT ([place]) RIGHT;  Surgeon: Eulogio Bear, MD;  Location: [facility];  Service: Ophthalmology;  Laterality: Right;  . COLONOSCOPY  2003  . PORT A CATH REVISION    . REPLACEMENT TOTAL KNEE Right 10/03/2019   DR. Harlow Mares   . WRIST SURGERY  2005    Family History  Problem Relation Age of Onset  . Cancer Father   . Bone cancer Father   . Breast cancer Mother 69  . Stroke Mother   . Todd cancer Maternal Grandmother   . Breast cancer Cousin     Social History:  reports that she has never smoked. She has never used smokeless tobacco. She reports current alcohol use of about 2.0 standard drinks of alcohol per week. She reports that she does not use drugs.The patient is alone today.  Allergies:  Allergies  Allergen Reactions  . Shrimp [Shellfish Allergy] Hives  . Augmentin [Amoxicillin-Pot Clavulanate] Nausea Only    Current Medications: Current Outpatient Medications  Medication Sig Dispense Refill  . acetaminophen (TYLENOL) 650 MG CR tablet Take 1,300 mg by mouth every 8 (eight) hours as needed for pain.    Marland Kitchen  amLODipine-valsartan (EXFORGE) 5-160 MG tablet Take 1 tablet by mouth daily. 90 tablet 1  . anastrozole (ARIMIDEX) 1 MG tablet Take 1 tablet by mouth once daily 60 tablet 0  . aspirin 81 MG tablet Take 81 mg by mouth daily.    Marland Kitchen CALCIUM-MAGNESIUM PO Take 1 tablet by mouth daily.    . cetirizine (ZYRTEC) 10 MG tablet Take 10 mg by mouth daily.    . Cholecalciferol (VITAMIN D-3 PO) Take 1 tablet by mouth daily.    . citalopram (CELEXA) 20 MG tablet Take 1 tablet (20 mg total) by mouth  daily. 90 tablet 1  . denosumab (PROLIA) 60 MG/ML SOSY injection Inject 60 mg into the skin every 6 (six) months.    . diclofenac Sodium (VOLTAREN) 1 % GEL Apply 2 g topically daily as needed.    . gabapentin (NEURONTIN) 100 MG capsule Take 1 capsule (100 mg total) by mouth at bedtime. 90 capsule 2  . meloxicam (MOBIC) 15 MG tablet Take 1 tablet (15 mg total) by mouth daily. 30 tablet 0  . metoprolol succinate (TOPROL-XL) 50 MG 24 hr tablet Take 1 tablet (50 mg total) by mouth daily. Take with or immediately following a meal. 90 tablet 1  . Multiple Vitamin (MULTIVITAMIN) tablet Take 1 tablet by mouth daily.    Marland Kitchen nystatin (MYCOSTATIN/NYSTOP) powder Apply topically 2 (two) times daily.    . pantoprazole (PROTONIX) 40 MG tablet Take 1 tablet (40 mg total) by mouth daily. 90 tablet 1  . tolterodine (DETROL LA) 4 MG 24 hr capsule Take 1 capsule (4 mg total) by mouth daily. 30 capsule 11   No current facility-administered medications for this visit.    Review of Systems  Constitutional: Negative for chills, diaphoresis, fever, malaise/fatigue and weight loss (up 13 lbs).  HENT: Negative.  Negative for congestion, ear discharge, ear pain, hearing loss, nosebleeds, sinus pain, sore throat and tinnitus.        No dental issues.  Eyes: Negative.  Negative for blurred vision and double vision.  Respiratory: Negative.  Negative for cough, hemoptysis, sputum production and shortness of breath.   Cardiovascular: Negative.  Negative for chest pain, palpitations and leg swelling.  Gastrointestinal: Negative.  Negative for abdominal pain, blood in stool, constipation, diarrhea, heartburn, melena, nausea and vomiting.  Genitourinary: Negative.  Negative for dysuria, frequency, hematuria and urgency.  Musculoskeletal: Negative.  Negative for back pain, joint pain, myalgias and neck pain.  Skin: Negative for itching and rash.       Sweating underneath breasts, uses Nystatin powder  Neurological: Negative.   Negative for dizziness, tingling, sensory change, speech change, focal weakness, weakness and headaches.  Endo/Heme/Allergies: Negative.  Does not bruise/bleed easily.  Psychiatric/Behavioral: Negative.  Negative for depression and memory loss. The patient is not nervous/anxious and does not have insomnia.   All other systems reviewed and are negative.  Performance status (ECOG): 0  Vitals Blood pressure (!) 136/94, pulse 84, temperature (!) 97.4 F (36.3 C), temperature source Tympanic, weight 229 lb 0.9 oz (103.9 kg).   Physical Exam Vitals and nursing note reviewed.  Constitutional:      General: She is not in acute distress.    Appearance: She is well-developed. She is not diaphoretic.  HENT:     Head: Normocephalic and atraumatic.     Mouth/Throat:     Mouth: Mucous membranes are moist.     Pharynx: Oropharynx is clear. No oropharyngeal exudate.  Eyes:     General: No scleral  icterus.    Extraocular Movements: Extraocular movements intact.     Conjunctiva/sclera: Conjunctivae normal.     Pupils: Pupils are equal, round, and reactive to light.     Comments: Glasses. Brown eyes.  Cardiovascular:     Rate and Rhythm: Normal rate and regular rhythm.     Heart sounds: Normal heart sounds. No murmur heard.   Pulmonary:     Effort: Pulmonary effort is normal. No respiratory distress.     Breath sounds: Normal breath sounds. No wheezing or rales.  Chest:     Chest wall: No tenderness.  Breasts:     Right: Skin change (prominent 3 cm area of scarring 12 cm from the nipple at the 2 o'clock position, stable. Scattered fibrocystic changes.) present. No inverted nipple, mass, nipple discharge, tenderness, axillary adenopathy or supraclavicular adenopathy.     Left: Skin change (fibrocystic changes upper outer quadrant) present. No inverted nipple, mass, nipple discharge, tenderness, axillary adenopathy or supraclavicular adenopathy.    Abdominal:     General: Bowel sounds are  normal. There is no distension.     Palpations: Abdomen is soft. There is no mass.     Tenderness: There is no abdominal tenderness. There is no guarding or rebound.  Musculoskeletal:        General: No swelling or tenderness. Normal range of motion.     Cervical back: Normal range of motion and neck supple.  Lymphadenopathy:     Head:     Right side of head: No preauricular, posterior auricular or occipital adenopathy.     Left side of head: No preauricular, posterior auricular or occipital adenopathy.     Cervical: No cervical adenopathy.     Upper Body:     Right upper body: No supraclavicular or axillary adenopathy.     Left upper body: No supraclavicular or axillary adenopathy.     Lower Body: No right inguinal adenopathy. No left inguinal adenopathy.  Skin:    General: Skin is warm and dry.  Neurological:     Mental Status: She is alert and oriented to person, place, and time.  Psychiatric:        Behavior: Behavior normal.        Thought Content: Thought content normal.        Judgment: Judgment normal.    Appointment on 12/27/2020  Component Date Value Ref Range Status  . LDH 12/27/2020 131  98 - 192 U/L Final   Performed at [facility], [address] [place], [place] [address]  . Sodium 12/27/2020 134* 135 - 145 mmol/L Final  . Potassium 12/27/2020 4.4  3.5 - 5.1 mmol/L Final  . Chloride 12/27/2020 99  98 - 111 mmol/L Final  . CO2 12/27/2020 25  22 - 32 mmol/L Final  . Glucose, Bld 12/27/2020 91  70 - 99 mg/dL Final   Glucose reference range applies only to samples taken after fasting for at least 8 hours.  . BUN 12/27/2020 17  8 - 23 mg/dL Final  . Creatinine, Ser 12/27/2020 0.66  0.44 - 1.00 mg/dL Final  . Calcium 12/27/2020 9.2  8.9 - 10.3 mg/dL Final  . Total Protein 12/27/2020 7.2  6.5 - 8.1 g/dL Final  . Albumin 12/27/2020 4.0  3.5 - 5.0 g/dL Final  . AST 12/27/2020 21  15 - 41 U/L Final  . ALT 12/27/2020 30  0 - 44 U/L Final  . Alkaline  Phosphatase 12/27/2020 70  38 - 126 U/L Final  .  Total Bilirubin 12/27/2020 0.8  0.3 - 1.2 mg/dL Final  . GFR, Estimated 12/27/2020 >60  >60 mL/min Final   Comment: (NOTE) Calculated using the CKD-EPI Creatinine Equation (2021)   . Anion gap 12/27/2020 10  5 - 15 Final   Performed at [facility], [address], [place], [place] [address]  . WBC 12/27/2020 6.9  4.0 - 10.5 K/uL Final  . RBC 12/27/2020 4.00  3.87 - 5.11 MIL/uL Final  . Hemoglobin 12/27/2020 12.3  12.0 - 15.0 g/dL Final  . HCT 10/43/8134 36.3  36.0 - 46.0 % Final  . MCV 12/27/2020 90.8  80.0 - 100.0 fL Final  . MCH 12/27/2020 30.8  26.0 - 34.0 pg Final  . MCHC 12/27/2020 33.9  30.0 - 36.0 g/dL Final  . RDW 97/53/0459 14.3  11.5 - 15.5 % Final  . Platelets 12/27/2020 223  150 - 400 K/uL Final  . nRBC 12/27/2020 0.0  0.0 - 0.2 % Final  . Neutrophils Relative % 12/27/2020 44  % Final  . Neutro Abs 12/27/2020 3.0  1.7 - 7.7 K/uL Final  . Lymphocytes Relative 12/27/2020 42  % Final  . Lymphs Abs 12/27/2020 2.9  0.7 - 4.0 K/uL Final  . Monocytes Relative 12/27/2020 9  % Final  . Monocytes Absolute 12/27/2020 0.6  0.1 - 1.0 K/uL Final  . Eosinophils Relative 12/27/2020 4  % Final  . Eosinophils Absolute 12/27/2020 0.3  0.0 - 0.5 K/uL Final  . Basophils Relative 12/27/2020 1  % Final  . Basophils Absolute 12/27/2020 0.0  0.0 - 0.1 K/uL Final  . Immature Granulocytes 12/27/2020 0  % Final  . Abs Immature Granulocytes 12/27/2020 0.02  0.00 - 0.07 K/uL Final   Performed at [facility], [address] [place], [place] [address]    Assessment:  Mary Todd is a 75 y.o. female with stage II Hodgkin's disease(2004) and stage IA right breast cancer(2011).  She was diagnosed with stage IIA Hodgkin's diseaseafter presenting with low counts and joint pain. Abdomen and pelvic CT scan as well as PET scan revealed a pelvic mass and retroperitoneal lymphadenopathy. Biopsy confirmed Hodgkin's  disease.   She received ABVDtimes 5 through cycle 3-A and then AVDwithout the Bleomycin times 7 subsequent cycles. Treatment completed in 12/2003.   CT and PET scanswere negative in 08/2007and in 06/2006.  She underwent right breast lumpectomyand sentinel lymph node biopsy on 07/21/2010. Pathology revealed a 0.5 cm grade II invasive ductal carcinoma. Two sentinel lymph nodes were negative. Tumor was ER + (>90%), PR + (40%), and Her2/neu 1+. Pathologic stage was T1aN0Mx. She received Mammositeradiation (completed 08/22/2010).  Oncotype DXtesting revealed a low risk lesion. BRCA1/2testing was negative. She received 5 years of an aromatase inhibitor.  Breast cancer index(BCI) testing on 07/23/2015 revealed a 6.3% (CI: 2.9%-9.6%) risk of late recurrent disease (years 5-10) after 5 years of hormonal therapy and a high likelihood of benefit from continued hormonal therapy (30% reduction). Decision was made to continueArimidexfor 5 more years.  Bilateral screening mammogram on 07/13/2020 revealed no evidence of malignancy.  CA27.29 has been followed: 8.4 on 07/19/2016, 13.4 on 01/24/2017, 7.1 on 08/01/2017, 12.1 on 01/30/2018, 5.7 on 07/31/2018, 12.1 on 02/25/2019, and 7.0 on 05/24/2020.  Thyroid ultrasoundon 04/13/2007 revealed a 2.3 cm solid and cystic nodule within the left thyroid gland. There was also a small nodule within the right thyroid gland that did not meet size criteria for measurement. There was a single benign appearing lymph node  within the right neck. FNAin 06/2016 was "indeterminate" and in 08/2016 was "benign".   Bone density studyon 09/05/2011 revealed osteopeniawith a T-score of -1.7 in L1-L4. Bone density studyon 11/14/2012 revealed osteopenia with a T-score of -1.5 in L1-L4. Bone density studyon 12/14/2016 revealed osteoporosis in L2-3 with a T-score of -3.1. Left femur normal with T-score of -0.8. Bone density on 05/05/2019 revealed  osteopenia with a T-score of -2.4 in the AP spine L2-L3.  She began Proliaon 01/24/2017 (last 06/24/2020).  Symptomatically, she feels good. She denies any breast concerns.  Exam is stable.  Plan: 1.  Labs today: CBC with diff, CMP, LDH, CA27.29 2. Stage IA right breast cancer Clinically, she is doing well.  Exam reveals no evidence of recurrent disease             Bilateral screening mammogram on 07/13/2020 revealed no evidence of malignancy.  She was to have completed Arimidex on 08/30/2020 (10 years). Discontinue Arimidex.  Continue to monitor annually 3. Stage II Hodgkin's disease Symptomatically, she is doing well.  Exam reveals no adenopathy or hepatosplenomegaly.             Exam is unremarkable. Follow-up imaging only if clinically indicated.  Continue surveillance. 4. Osteopenia Bone density on 05/05/2019 revealed osteopenia (improved from osteoporosis).  Continue calcium and vitamin D. Patient denies any dental issues.    Prolia today.  Next bone density on 05/05/2021. 5. Thyroid nodule Followed by Dr Gabriel Carina, endocrinology. 6.   Prolia today. 7.   Bone density on 05/05/2021. 8.   Mammogram on 07/13/2021. 9.   RTC in 6 months for labs (BMP) and Prolia. 10.   RTC in 1 year for MD assessment, labs (CBC with diff, CMP, LDH, CA27.29), review of mammogram, and Prolia.  I discussed the assessment and treatment plan with the patient.  The patient was provided an opportunity to ask questions and all were answered.  The patient agreed with the plan and demonstrated an understanding of the instructions.  The patient was advised to call back if the symptoms worsen or if the condition fails to improve as anticipated.  I provided 21 minutes of face-to-face time during this this encounter and > 50% was spent counseling as documented under my assessment and plan.  An additional 7 minutes were spent  reviewing her chart (Epic and Care Everywhere) including notes, labs, and imaging studies.    Lequita Asal, MD, PhD    12/27/2020, 1:51 PM  I, Mirian Mo Tufford, am acting as Education administrator for [employer]. Mike Gip, MD, PhD.  I, Melissa C. Mike Gip, MD, have reviewed the above documentation for accuracy and completeness, and I agree with the above.

## 2020-12-27 ENCOUNTER — Encounter: Payer: Self-pay | Admitting: Hematology and Oncology

## 2020-12-27 ENCOUNTER — Telehealth: Payer: Self-pay | Admitting: Family Medicine

## 2020-12-27 ENCOUNTER — Other Ambulatory Visit: Payer: Self-pay

## 2020-12-27 ENCOUNTER — Inpatient Hospital Stay: Payer: Medicare Other

## 2020-12-27 ENCOUNTER — Inpatient Hospital Stay: Payer: Medicare Other | Attending: Hematology and Oncology | Admitting: Hematology and Oncology

## 2020-12-27 VITALS — BP 136/94 | HR 84 | Temp 97.4°F | Wt 229.1 lb

## 2020-12-27 DIAGNOSIS — C8193 Hodgkin lymphoma, unspecified, intra-abdominal lymph nodes: Secondary | ICD-10-CM

## 2020-12-27 DIAGNOSIS — M810 Age-related osteoporosis without current pathological fracture: Secondary | ICD-10-CM | POA: Insufficient documentation

## 2020-12-27 DIAGNOSIS — M8588 Other specified disorders of bone density and structure, other site: Secondary | ICD-10-CM | POA: Diagnosis not present

## 2020-12-27 DIAGNOSIS — C50911 Malignant neoplasm of unspecified site of right female breast: Secondary | ICD-10-CM

## 2020-12-27 DIAGNOSIS — E041 Nontoxic single thyroid nodule: Secondary | ICD-10-CM

## 2020-12-27 DIAGNOSIS — Z853 Personal history of malignant neoplasm of breast: Secondary | ICD-10-CM | POA: Diagnosis not present

## 2020-12-27 LAB — CBC WITH DIFFERENTIAL/PLATELET
Abs Immature Granulocytes: 0.02 10*3/uL (ref 0.00–0.07)
Basophils Absolute: 0 10*3/uL (ref 0.0–0.1)
Basophils Relative: 1 %
Eosinophils Absolute: 0.3 10*3/uL (ref 0.0–0.5)
Eosinophils Relative: 4 %
HCT: 36.3 % (ref 36.0–46.0)
Hemoglobin: 12.3 g/dL (ref 12.0–15.0)
Immature Granulocytes: 0 %
Lymphocytes Relative: 42 %
Lymphs Abs: 2.9 10*3/uL (ref 0.7–4.0)
MCH: 30.8 pg (ref 26.0–34.0)
MCHC: 33.9 g/dL (ref 30.0–36.0)
MCV: 90.8 fL (ref 80.0–100.0)
Monocytes Absolute: 0.6 10*3/uL (ref 0.1–1.0)
Monocytes Relative: 9 %
Neutro Abs: 3 10*3/uL (ref 1.7–7.7)
Neutrophils Relative %: 44 %
Platelets: 223 10*3/uL (ref 150–400)
RBC: 4 MIL/uL (ref 3.87–5.11)
RDW: 14.3 % (ref 11.5–15.5)
WBC: 6.9 10*3/uL (ref 4.0–10.5)
nRBC: 0 % (ref 0.0–0.2)

## 2020-12-27 LAB — COMPREHENSIVE METABOLIC PANEL
ALT: 30 U/L (ref 0–44)
AST: 21 U/L (ref 15–41)
Albumin: 4 g/dL (ref 3.5–5.0)
Alkaline Phosphatase: 70 U/L (ref 38–126)
Anion gap: 10 (ref 5–15)
BUN: 17 mg/dL (ref 8–23)
CO2: 25 mmol/L (ref 22–32)
Calcium: 9.2 mg/dL (ref 8.9–10.3)
Chloride: 99 mmol/L (ref 98–111)
Creatinine, Ser: 0.66 mg/dL (ref 0.44–1.00)
GFR, Estimated: >60 mL/min (ref >60)
Glucose, Bld: 91 mg/dL (ref 70–99)
Potassium: 4.4 mmol/L (ref 3.5–5.1)
Sodium: 134 mmol/L — ABNORMAL LOW (ref 135–145)
Total Bilirubin: 0.8 mg/dL (ref 0.3–1.2)
Total Protein: 7.2 g/dL (ref 6.5–8.1)

## 2020-12-27 LAB — LACTATE DEHYDROGENASE: LDH: 131 U/L (ref 98–192)

## 2020-12-27 MED ORDER — NYSTATIN 100000 UNIT/GM EX POWD: Freq: Two times a day (BID) | CUTANEOUS | 4 refills | Status: DC

## 2020-12-27 MED ORDER — DENOSUMAB 60 MG/ML ~~LOC~~ SOSY
60.0000 mg | PREFILLED_SYRINGE | Freq: Once | SUBCUTANEOUS | Status: AC
  Administered 2020-12-27: 60 mg via SUBCUTANEOUS

## 2020-12-27 NOTE — Telephone Encounter (Signed)
Copied from [place] [id number]. Topic: Medicare AWV >> Dec 27, 2020  1:18 PM Cher Nakai R wrote: Reason for CRM:  Left message for patient to call back and schedule Medicare Annual Wellness Visit (AWV) in office.   If unable to come into the office for AWV,  please offer to do virtually or by telephone.  Last AWV:  01/01/2020  Please schedule at anytime with [place].  40 minute appointment  Any questions, please contact me at [phone]

## 2020-12-27 NOTE — Patient Instructions (Signed)
  Discontinue Armidex.

## 2020-12-27 NOTE — Progress Notes (Signed)
Patient denies any concerns today.  

## 2020-12-28 LAB — CANCER ANTIGEN 27.29: CA 27.29: 7.7 U/mL (ref 0.0–38.6)

## 2020-12-30 NOTE — Progress Notes (Deleted)
Name: Mary Todd   MRN: [id number]    DOB: 1946-07-09   Date:12/30/2020       Progress Note  Subjective  Chief Complaint  Follow Up  HPI    Patient Active Problem List   Diagnosis Date Noted  . Total knee replacement status, right 10/03/2019  . Encounter for screening mammogram for breast cancer 07/02/2018  . Anterolisthesis 08/06/2017  . DDD (degenerative disc disease), lumbosacral 08/06/2017  . Chondromalacia patellae 07/03/2017  . Osteoporosis 12/14/2016  . Morbid obesity ([place]) 07/17/2016  . Spinal stenosis in cervical region 05/22/2016  . Thyroid nodule 05/22/2016  . GERD without esophagitis 03/16/2016  . Neck muscle spasm 03/16/2016  . History of back surgery 03/16/2016  . Osteoarthritis of both knees 03/16/2016  . Large breasts 03/16/2016  . Hypertension, benign 03/16/2016  . Perennial allergic rhinitis with seasonal variation 03/16/2016  . History of pneumococcal pneumonia 09/20/2015  . Hodgkin disease ([place]) 07/15/2014  . Breast cancer, right ([place]) 06/26/2013    Past Surgical History:  Procedure Laterality Date  . ABDOMINAL HYSTERECTOMY    . BACK SURGERY    . BREAST BIOPSY Right 2011   radiation  . BREAST LUMPECTOMY Right 2011   w/ radiation  . BREAST SURGERY Right 2011   right breast lumpectomy,L/SN/R for right breast CA   . CATARACT EXTRACTION W/PHACO Left 06/10/2018   Procedure: CATARACT EXTRACTION PHACO AND INTRAOCULAR LENS PLACEMENT ([place]) LEFT;  Surgeon: Eulogio Bear, MD;  Location: [facility];  Service: Ophthalmology;  Laterality: Left;  Diabetic - oral meds  . CATARACT EXTRACTION W/PHACO Right 07/15/2018   Procedure: CATARACT EXTRACTION PHACO AND INTRAOCULAR LENS PLACEMENT ([place]) RIGHT;  Surgeon: Eulogio Bear, MD;  Location: [facility];  Service: Ophthalmology;  Laterality: Right;  . COLONOSCOPY  2003  . PORT A CATH REVISION    . REPLACEMENT TOTAL KNEE Right 10/03/2019   DR. Harlow Mares   . WRIST SURGERY  2005     Family History  Problem Relation Age of Onset  . Cancer Father   . Bone cancer Father   . Breast cancer Mother 47  . Stroke Mother   . Colon cancer Maternal Grandmother   . Breast cancer Cousin     Social History   Tobacco Use  . Smoking status: Never Smoker  . Smokeless tobacco: Never Used  . Tobacco comment: smoking cessation materials not required  Substance Use Topics  . Alcohol use: Yes    Alcohol/week: 2.0 standard drinks    Types: 2 Glasses of wine per week    Comment: occassionally     Current Outpatient Medications:  .  acetaminophen (TYLENOL) 650 MG CR tablet, Take 1,300 mg by mouth every 8 (eight) hours as needed for pain., Disp: , Rfl:  .  amLODipine-valsartan (EXFORGE) 5-160 MG tablet, Take 1 tablet by mouth daily., Disp: 90 tablet, Rfl: 1 .  anastrozole (ARIMIDEX) 1 MG tablet, Take 1 tablet by mouth once daily, Disp: 60 tablet, Rfl: 0 .  aspirin 81 MG tablet, Take 81 mg by mouth daily., Disp: , Rfl:  .  CALCIUM-MAGNESIUM PO, Take 1 tablet by mouth daily., Disp: , Rfl:  .  cetirizine (ZYRTEC) 10 MG tablet, Take 10 mg by mouth daily., Disp: , Rfl:  .  Cholecalciferol (VITAMIN D-3 PO), Take 1 tablet by mouth daily., Disp: , Rfl:  .  citalopram (CELEXA) 20 MG tablet, Take 1 tablet (20 mg total) by mouth daily., Disp: 90 tablet, Rfl: 1 .  denosumab (PROLIA)  60 MG/ML SOSY injection, Inject 60 mg into the skin every 6 (six) months., Disp: , Rfl:  .  diclofenac Sodium (VOLTAREN) 1 % GEL, Apply 2 g topically daily as needed., Disp: , Rfl:  .  gabapentin (NEURONTIN) 100 MG capsule, Take 1 capsule (100 mg total) by mouth at bedtime., Disp: 90 capsule, Rfl: 2 .  meloxicam (MOBIC) 15 MG tablet, Take 1 tablet (15 mg total) by mouth daily., Disp: 30 tablet, Rfl: 0 .  metoprolol succinate (TOPROL-XL) 50 MG 24 hr tablet, Take 1 tablet (50 mg total) by mouth daily. Take with or immediately following a meal., Disp: 90 tablet, Rfl: 1 .  Multiple Vitamin (MULTIVITAMIN) tablet,  Take 1 tablet by mouth daily., Disp: , Rfl:  .  nystatin (MYCOSTATIN/NYSTOP) powder, Apply topically 2 (two) times daily., Disp: 15 g, Rfl: 4 .  pantoprazole (PROTONIX) 40 MG tablet, Take 1 tablet (40 mg total) by mouth daily., Disp: 90 tablet, Rfl: 1 .  tolterodine (DETROL LA) 4 MG 24 hr capsule, Take 1 capsule (4 mg total) by mouth daily., Disp: 30 capsule, Rfl: 11  Allergies  Allergen Reactions  . Shrimp [Shellfish Allergy] Hives  . Augmentin [Amoxicillin-Pot Clavulanate] Nausea Only    I personally reviewed {Reviewed:14835} with the patient/caregiver today.   ROS  ***  Objective  There were no vitals filed for this visit.  There is no height or weight on file to calculate BMI.  Physical Exam ***  Recent Results (from the past 2160 hour(s))  Cancer antigen 27.29     Status: None   Collection Time: 12/27/20  1:13 PM  Result Value Ref Range   CA 27.29 7.7 0.0 - 38.6 U/mL    Comment: (NOTE) Siemens Centaur Immunochemiluminometric Methodology (ICMA) Values obtained with different assay methods or kits cannot be used interchangeably. Results cannot be interpreted as absolute evidence of the presence or absence of malignant disease. Performed At: [facility] [place], [place] [id number] Rush Farmer MD [id number]   Lactate dehydrogenase     Status: None   Collection Time: 12/27/20  1:13 PM  Result Value Ref Range   LDH 131 98 - 192 U/L    Comment: Performed at [facility], [address], [place], [place] [address]  Comprehensive metabolic panel     Status: Abnormal   Collection Time: 12/27/20  1:13 PM  Result Value Ref Range   Sodium 134 (L) 135 - 145 mmol/L   Potassium 4.4 3.5 - 5.1 mmol/L   Chloride 99 98 - 111 mmol/L   CO2 25 22 - 32 mmol/L   Glucose, Bld 91 70 - 99 mg/dL    Comment: Glucose reference range applies only to samples taken after fasting for at least 8 hours.   BUN 17 8 - 23 mg/dL   Creatinine, Ser 0.66  0.44 - 1.00 mg/dL   Calcium 9.2 8.9 - 10.3 mg/dL   Total Protein 7.2 6.5 - 8.1 g/dL   Albumin 4.0 3.5 - 5.0 g/dL   AST 21 15 - 41 U/L   ALT 30 0 - 44 U/L   Alkaline Phosphatase 70 38 - 126 U/L   Total Bilirubin 0.8 0.3 - 1.2 mg/dL   GFR, Estimated >60 >60 mL/min    Comment: (NOTE) Calculated using the CKD-EPI Creatinine Equation (2021)    Anion gap 10 5 - 15    Comment: Performed at [facility], [address] [place], [place] [address]  CBC with Differential/Platelet  Status: None   Collection Time: 12/27/20  1:13 PM  Result Value Ref Range   WBC 6.9 4.0 - 10.5 K/uL   RBC 4.00 3.87 - 5.11 MIL/uL   Hemoglobin 12.3 12.0 - 15.0 g/dL   HCT 36.3 36.0 - 46.0 %   MCV 90.8 80.0 - 100.0 fL   MCH 30.8 26.0 - 34.0 pg   MCHC 33.9 30.0 - 36.0 g/dL   RDW 14.3 11.5 - 15.5 %   Platelets 223 150 - 400 K/uL   nRBC 0.0 0.0 - 0.2 %   Neutrophils Relative % 44 %   Neutro Abs 3.0 1.7 - 7.7 K/uL   Lymphocytes Relative 42 %   Lymphs Abs 2.9 0.7 - 4.0 K/uL   Monocytes Relative 9 %   Monocytes Absolute 0.6 0.1 - 1.0 K/uL   Eosinophils Relative 4 %   Eosinophils Absolute 0.3 0.0 - 0.5 K/uL   Basophils Relative 1 %   Basophils Absolute 0.0 0.0 - 0.1 K/uL   Immature Granulocytes 0 %   Abs Immature Granulocytes 0.02 0.00 - 0.07 K/uL    Comment: Performed at [facility], [address], [place], [place] [address]    Diabetic Foot Exam: Diabetic Foot Exam - Simple   No data filed    ***  PHQ2/9: Depression screen [facility] 2/9 07/06/2020 07/06/2020 04/13/2020 03/02/2020 01/01/2020  Decreased Interest 0 0 0 1 0  Down, Depressed, Hopeless 0 0 0 1 0  PHQ - 2 Score 0 0 0 2 0  Altered sleeping 0 - 0 1 -  Tired, decreased energy 0 - 0 2 -  Change in appetite 0 - 0 1 -  Feeling bad or failure about yourself  0 - 0 1 -  Trouble concentrating 0 - 0 0 -  Moving slowly or fidgety/restless 0 - 0 0 -  Suicidal thoughts 0 - 0 0 -  PHQ-9 Score 0 - 0 7 -  Difficult doing work/chores -  - - Somewhat difficult -  Some recent data might be hidden    phq 9 is {gen pos [id number]} ***  Fall Risk: Fall Risk  07/06/2020 04/13/2020 03/02/2020 01/01/2020 11/03/2019  Falls in the past year? 1 0 0 0 0  Number falls in past yr: 0 0 0 0 0  Injury with Fall? 0 - 0 0 0  Risk for fall due to : - - - Orthopedic patient -  Follow up - - Falls evaluation completed Falls prevention discussed -   ***   Functional Status Survey:   ***   Assessment & Plan  *** There are no diagnoses linked to this encounter.

## 2021-01-03 ENCOUNTER — Ambulatory Visit: Payer: Medicare Other | Admitting: Family Medicine

## 2021-01-03 NOTE — Progress Notes (Signed)
Name: Mary Todd   MRN: [id number]    DOB: 1946/03/16   Date:01/04/2021       Progress Note  Subjective  Chief Complaint  Follow Up  HPI  Pre-diabetes: she denies polyphagia, polydipsia or polyuria, she has been off  Metformin since recall in 2020 , last A1C was 5.5% , we will recheck labs next visit   Urge incontinence:. Sheis under the care of Urologist, taking Detrol and seems to be improving symptoms, she is also having   post tibial neuro stimulation needle electrode therapy monthlyr. She states nocturia down to once or twice per night    Left lateral foot pain: started about 6 months ago, went to [facility] and followed up with podiatrist, she had tree steroid injections and is doing better, pain no longer daily.   HTN: she is taking  Exforge 5/160 and also metoprolol, tolerating medication well, no chest pain or palpitation bp is at goal now. No chest pain or palpitation.   Flatulence: states when she stands up she passes gas, discussed avoiding drinking and eating, don't drink from straw and cut down on chewing gum, may try phazyme otc  GERD:she tried stopping Pantoprazole but as soon as she went off medication symptoms became severe, with heartburn  and regurgitation, andhas been taking it daily again.and symptoms have been controlled   OA: she had TKR right knee on Dec 4 th 2020 by Dr. Harlow Mares , she completed  PT and is doing well, she denies any pain, normal extension but has a catch when flexing at times. She needs to take Meloxicam daily otherwise she feels very stiff. She also takes tylenol at night to control symptoms   History RightBreast Cancer/hodgkins lymphoma : under the care of Dr. Mike Gip she has been released from her surgeon. . Also diagnosed with hodgkin's lymphoma in 2004, breast cancer diagnosed in 2011she finally stopped Arimidex Feb 2021   Osteoporosis: she is on Prolia,  found on bone density of spine, she is tolerating medications well, she gets  the infusion at the cancer center and no side effects, last dose 11/2020   Thyroid nodules: incidental finding on CT neck , had biopsy by Dr. Gabriel Carina Dec 2017 and biopsy was negative, she was last seen 05/2020 and has been released from her care   Obesity: she has long history of obesity, she isoffMetformin,her weight is stable, but trending up a little.    Dysthymia: she is taking Celexa 20 mg daily, started during the pandemic, she also had lost her father and an aunt, she had lack of motivation. She is doing much better, we will try weaning medication down to 10 mg daily  ( half pill ) for one week and can try to stop if still feeling okay   Patient Active Problem List   Diagnosis Date Noted  . Total knee replacement status, right 10/03/2019  . Encounter for screening mammogram for breast cancer 07/02/2018  . Anterolisthesis 08/06/2017  . DDD (degenerative disc disease), lumbosacral 08/06/2017  . Chondromalacia patellae 07/03/2017  . Osteoporosis 12/14/2016  . Morbid obesity ([place]) 07/17/2016  . Spinal stenosis in cervical region 05/22/2016  . Thyroid nodule 05/22/2016  . GERD without esophagitis 03/16/2016  . Neck muscle spasm 03/16/2016  . History of back surgery 03/16/2016  . Osteoarthritis of both knees 03/16/2016  . Large breasts 03/16/2016  . Hypertension, benign 03/16/2016  . Perennial allergic rhinitis with seasonal variation 03/16/2016  . History of pneumococcal pneumonia 09/20/2015  . Hodgkin disease (  [place]) 07/15/2014  . Breast cancer, right ([place]) 06/26/2013    Past Surgical History:  Procedure Laterality Date  . ABDOMINAL HYSTERECTOMY    . BACK SURGERY    . BREAST BIOPSY Right 2011   radiation  . BREAST LUMPECTOMY Right 2011   w/ radiation  . BREAST SURGERY Right 2011   right breast lumpectomy,L/SN/R for right breast CA   . CATARACT EXTRACTION W/PHACO Left 06/10/2018   Procedure: CATARACT EXTRACTION PHACO AND INTRAOCULAR LENS PLACEMENT ([place]) LEFT;  Surgeon:  Eulogio Bear, MD;  Location: [facility];  Service: Ophthalmology;  Laterality: Left;  Diabetic - oral meds  . CATARACT EXTRACTION W/PHACO Right 07/15/2018   Procedure: CATARACT EXTRACTION PHACO AND INTRAOCULAR LENS PLACEMENT ([place]) RIGHT;  Surgeon: Eulogio Bear, MD;  Location: [facility];  Service: Ophthalmology;  Laterality: Right;  . COLONOSCOPY  2003  . PORT A CATH REVISION    . REPLACEMENT TOTAL KNEE Right 10/03/2019   DR. Harlow Mares   . WRIST SURGERY  2005    Family History  Problem Relation Age of Onset  . Cancer Father   . Bone cancer Father   . Breast cancer Mother 24  . Stroke Mother   . Colon cancer Maternal Grandmother   . Breast cancer Cousin     Social History   Tobacco Use  . Smoking status: Never Smoker  . Smokeless tobacco: Never Used  . Tobacco comment: smoking cessation materials not required  Substance Use Topics  . Alcohol use: Yes    Alcohol/week: 2.0 standard drinks    Types: 2 Glasses of wine per week    Comment: occassionally     Current Outpatient Medications:  .  acetaminophen (TYLENOL) 650 MG CR tablet, Take 1,300 mg by mouth every 8 (eight) hours as needed for pain., Disp: , Rfl:  .  aspirin 81 MG tablet, Take 81 mg by mouth daily., Disp: , Rfl:  .  CALCIUM-MAGNESIUM PO, Take 1 tablet by mouth daily., Disp: , Rfl:  .  cetirizine (ZYRTEC) 10 MG tablet, Take 10 mg by mouth daily., Disp: , Rfl:  .  Cholecalciferol (VITAMIN D-3 PO), Take 1 tablet by mouth daily., Disp: , Rfl:  .  denosumab (PROLIA) 60 MG/ML SOSY injection, Inject 60 mg into the skin every 6 (six) months., Disp: , Rfl:  .  diclofenac Sodium (VOLTAREN) 1 % GEL, Apply 2 g topically daily as needed., Disp: , Rfl:  .  gabapentin (NEURONTIN) 100 MG capsule, Take 1 capsule (100 mg total) by mouth at bedtime., Disp: 90 capsule, Rfl: 2 .  metoprolol succinate (TOPROL-XL) 50 MG 24 hr tablet, Take 1 tablet (50 mg total) by mouth daily. Take with or immediately following  a meal., Disp: 90 tablet, Rfl: 1 .  Multiple Vitamin (MULTIVITAMIN) tablet, Take 1 tablet by mouth daily., Disp: , Rfl:  .  nystatin (MYCOSTATIN/NYSTOP) powder, Apply topically 2 (two) times daily., Disp: 15 g, Rfl: 4 .  tolterodine (DETROL LA) 4 MG 24 hr capsule, Take 1 capsule (4 mg total) by mouth daily., Disp: 30 capsule, Rfl: 11 .  amLODipine-valsartan (EXFORGE) 5-160 MG tablet, Take 1 tablet by mouth daily., Disp: 90 tablet, Rfl: 1 .  meloxicam (MOBIC) 15 MG tablet, Take 1 tablet (15 mg total) by mouth daily., Disp: 90 tablet, Rfl: 1 .  pantoprazole (PROTONIX) 40 MG tablet, Take 1 tablet (40 mg total) by mouth daily., Disp: 90 tablet, Rfl: 1  Allergies  Allergen Reactions  . Shrimp [Shellfish Allergy] Hives  .  Augmentin [Amoxicillin-Pot Clavulanate] Nausea Only    I personally reviewed active problem list, medication list, allergies, family history, social history, health maintenance with the patient/caregiver today.   ROS  Constitutional: Negative for fever or weight change.  Respiratory: Negative for cough and shortness of breath.   Cardiovascular: Negative for chest pain or palpitations.  Gastrointestinal: Negative for abdominal pain, no bowel changes.  Musculoskeletal: Negative for gait problem or joint swelling.  Skin: Negative for rash.  Neurological: Negative for dizziness or headache.  No other specific complaints in a complete review of systems (except as listed in HPI above).  Objective  Vitals:   01/04/21 1136 01/04/21 1140  BP: (!) 140/98 136/84  Pulse: 90   Resp: 16   Temp: 98.3 F (36.8 C)   TempSrc: Oral   SpO2: 99%   Weight: 226 lb (102.5 kg)   Height: 5\' 3"  (1.6 m)     Body mass index is 40.03 kg/m.  Physical Exam  Constitutional: Patient appears well-developed and well-nourished. Obese No distress.  HEENT: head atraumatic, normocephalic, pupils equal and reactive to light, neck supple Cardiovascular: Normal rate, regular rhythm and normal  heart sounds.  No murmur heard. No BLE edema. Pulmonary/Chest: Effort normal and breath sounds normal. No respiratory distress. Abdominal: Soft.  There is no tenderness. Psychiatric: Patient has a normal mood and affect. behavior is normal. Judgment and thought content normal.  Recent Results (from the past 2160 hour(s))  Cancer antigen 27.29     Status: None   Collection Time: 12/27/20  1:13 PM  Result Value Ref Range   CA 27.29 7.7 0.0 - 38.6 U/mL    Comment: (NOTE) Siemens Centaur Immunochemiluminometric Methodology (ICMA) Values obtained with different assay methods or kits cannot be used interchangeably. Results cannot be interpreted as absolute evidence of the presence or absence of malignant disease. Performed At: [facility] [place], [place] [id number] Rush Farmer MD [id number]   Lactate dehydrogenase     Status: None   Collection Time: 12/27/20  1:13 PM  Result Value Ref Range   LDH 131 98 - 192 U/L    Comment: Performed at [facility], [address] [place], [place] [address]  Comprehensive metabolic panel     Status: Abnormal   Collection Time: 12/27/20  1:13 PM  Result Value Ref Range   Sodium 134 (L) 135 - 145 mmol/L   Potassium 4.4 3.5 - 5.1 mmol/L   Chloride 99 98 - 111 mmol/L   CO2 25 22 - 32 mmol/L   Glucose, Bld 91 70 - 99 mg/dL    Comment: Glucose reference range applies only to samples taken after fasting for at least 8 hours.   BUN 17 8 - 23 mg/dL   Creatinine, Ser 0.66 0.44 - 1.00 mg/dL   Calcium 9.2 8.9 - 10.3 mg/dL   Total Protein 7.2 6.5 - 8.1 g/dL   Albumin 4.0 3.5 - 5.0 g/dL   AST 21 15 - 41 U/L   ALT 30 0 - 44 U/L   Alkaline Phosphatase 70 38 - 126 U/L   Total Bilirubin 0.8 0.3 - 1.2 mg/dL   GFR, Estimated >60 >60 mL/min    Comment: (NOTE) Calculated using the CKD-EPI Creatinine Equation (2021)    Anion gap 10 5 - 15    Comment: Performed at [facility], [address],  [place], [place] [address]  CBC with Differential/Platelet     Status: None   Collection Time:  12/27/20  1:13 PM  Result Value Ref Range   WBC 6.9 4.0 - 10.5 K/uL   RBC 4.00 3.87 - 5.11 MIL/uL   Hemoglobin 12.3 12.0 - 15.0 g/dL   HCT 36.3 36.0 - 46.0 %   MCV 90.8 80.0 - 100.0 fL   MCH 30.8 26.0 - 34.0 pg   MCHC 33.9 30.0 - 36.0 g/dL   RDW 14.3 11.5 - 15.5 %   Platelets 223 150 - 400 K/uL   nRBC 0.0 0.0 - 0.2 %   Neutrophils Relative % 44 %   Neutro Abs 3.0 1.7 - 7.7 K/uL   Lymphocytes Relative 42 %   Lymphs Abs 2.9 0.7 - 4.0 K/uL   Monocytes Relative 9 %   Monocytes Absolute 0.6 0.1 - 1.0 K/uL   Eosinophils Relative 4 %   Eosinophils Absolute 0.3 0.0 - 0.5 K/uL   Basophils Relative 1 %   Basophils Absolute 0.0 0.0 - 0.1 K/uL   Immature Granulocytes 0 %   Abs Immature Granulocytes 0.02 0.00 - 0.07 K/uL    Comment: Performed at [facility], [address], [place], [place] [address]      PHQ2/9: Depression screen [facility] 2/9 01/04/2021 07/06/2020 07/06/2020 04/13/2020 03/02/2020  Decreased Interest 0 0 0 0 1  Down, Depressed, Hopeless 0 0 0 0 1  PHQ - 2 Score 0 0 0 0 2  Altered sleeping 0 0 - 0 1  Tired, decreased energy 0 0 - 0 2  Change in appetite 0 0 - 0 1  Feeling bad or failure about yourself  0 0 - 0 1  Trouble concentrating 0 0 - 0 0  Moving slowly or fidgety/restless 0 0 - 0 0  Suicidal thoughts 0 0 - 0 0  PHQ-9 Score 0 0 - 0 7  Difficult doing work/chores - - - - Somewhat difficult  Some recent data might be hidden    phq 9 is negative   Fall Risk: Fall Risk  01/04/2021 07/06/2020 04/13/2020 03/02/2020 01/01/2020  Falls in the past year? 0 1 0 0 0  Number falls in past yr: 0 0 0 0 0  Injury with Fall? 0 0 - 0 0  Risk for fall due to : - - - - Orthopedic patient  Follow up - - - Falls evaluation completed Falls prevention discussed     Functional Status Survey: Is the patient deaf or have difficulty hearing?: No Does the patient have difficulty seeing, even when  wearing glasses/contacts?: No Does the patient have difficulty concentrating, remembering, or making decisions?: No Does the patient have difficulty walking or climbing stairs?: No Does the patient have difficulty dressing or bathing?: No Does the patient have difficulty doing errands alone such as visiting a doctor's office or shopping?: No    Assessment & Plan  1. Hodgkin lymphoma of intra-abdominal lymph nodes, unspecified Hodgkin lymphoma type ([place])   2. Primary osteoarthritis of both knees  - meloxicam (MOBIC) 15 MG tablet; Take 1 tablet (15 mg total) by mouth daily.  Dispense: 90 tablet; Refill: 1  3. Morbid obesity ([place])  Discussed with the patient the risk posed by an increased BMI. Discussed importance of portion control, calorie counting and at least 150 minutes of physical activity weekly. Avoid sweet beverages and drink more water. Eat at least 6 servings of fruit and vegetables daily   4. Essential hypertension  - amLODipine-valsartan (EXFORGE) 5-160 MG tablet; Take 1 tablet by mouth daily.  Dispense: 90  tablet; Refill: 1  5. GERD without esophagitis  - pantoprazole (PROTONIX) 40 MG tablet; Take 1 tablet (40 mg total) by mouth daily.  Dispense: 90 tablet; Refill: 1  6. Age-related osteoporosis without current pathological fracture   7. Pre-diabetes   8. Dysthymia  We will try weaning her off medication   9. History of right breast cancer

## 2021-01-04 ENCOUNTER — Ambulatory Visit (INDEPENDENT_AMBULATORY_CARE_PROVIDER_SITE_OTHER): Payer: Medicare Other | Admitting: Family Medicine

## 2021-01-04 ENCOUNTER — Other Ambulatory Visit: Payer: Self-pay

## 2021-01-04 ENCOUNTER — Encounter: Payer: Self-pay | Admitting: Family Medicine

## 2021-01-04 VITALS — BP 136/84 | HR 90 | Temp 98.3°F | Resp 16 | Ht 63.0 in | Wt 226.0 lb

## 2021-01-04 DIAGNOSIS — K219 Gastro-esophageal reflux disease without esophagitis: Secondary | ICD-10-CM | POA: Diagnosis not present

## 2021-01-04 DIAGNOSIS — C8193 Hodgkin lymphoma, unspecified, intra-abdominal lymph nodes: Secondary | ICD-10-CM

## 2021-01-04 DIAGNOSIS — I10 Essential (primary) hypertension: Secondary | ICD-10-CM

## 2021-01-04 DIAGNOSIS — F341 Dysthymic disorder: Secondary | ICD-10-CM

## 2021-01-04 DIAGNOSIS — Z853 Personal history of malignant neoplasm of breast: Secondary | ICD-10-CM

## 2021-01-04 DIAGNOSIS — M170 Bilateral primary osteoarthritis of knee: Secondary | ICD-10-CM

## 2021-01-04 DIAGNOSIS — M810 Age-related osteoporosis without current pathological fracture: Secondary | ICD-10-CM | POA: Diagnosis not present

## 2021-01-04 DIAGNOSIS — R7303 Prediabetes: Secondary | ICD-10-CM | POA: Diagnosis not present

## 2021-01-04 MED ORDER — PANTOPRAZOLE SODIUM 40 MG PO TBEC
40.0000 mg | DELAYED_RELEASE_TABLET | Freq: Every day | ORAL | 1 refills | Status: DC
Start: 2021-01-04 — End: 2021-07-08

## 2021-01-04 MED ORDER — AMLODIPINE BESYLATE-VALSARTAN 5-160 MG PO TABS: 1.0000 | ORAL_TABLET | Freq: Every day | ORAL | 1 refills | Status: DC

## 2021-01-04 MED ORDER — MELOXICAM 15 MG PO TABS: 15.0000 mg | ORAL_TABLET | Freq: Every day | ORAL | 1 refills | Status: DC

## 2021-01-06 ENCOUNTER — Ambulatory Visit: Payer: Medicare Other

## 2021-01-09 NOTE — Progress Notes (Signed)
ERROR

## 2021-01-10 ENCOUNTER — Ambulatory Visit (INDEPENDENT_AMBULATORY_CARE_PROVIDER_SITE_OTHER): Payer: Medicare Other | Admitting: Urology

## 2021-01-10 ENCOUNTER — Telehealth: Payer: Self-pay | Admitting: Family Medicine

## 2021-01-10 ENCOUNTER — Encounter: Payer: Self-pay | Admitting: Urology

## 2021-01-10 ENCOUNTER — Other Ambulatory Visit: Payer: Self-pay

## 2021-01-10 DIAGNOSIS — N3946 Mixed incontinence: Secondary | ICD-10-CM

## 2021-01-10 NOTE — Telephone Encounter (Signed)
Copied from [place] [id number]. Topic: Medicare AWV >> Jan 10, 2021  3:43 PM Cher Nakai R wrote: Reason for CRM:  No answer unable to leave a message for patient to call back and schedule Medicare Annual Wellness Visit (AWV) in office.   If unable to come into the office for AWV,  please offer to do virtually or by telephone.  Last AWV: 01/01/2020  Please schedule at anytime with [place].  40 minute appointment  Any questions, please contact me at [phone]

## 2021-01-10 NOTE — Progress Notes (Signed)
Patient ID: Mary Todd, female   DOB: 1946/04/22, 75 y.o.   MRN: [id number] PTNS  Session # Monthly  Health & Social Factors: no change Caffeine: 1 Alcohol: 1 Daytime voids #per day: 3-4 Night-time voids #per night: 1 Urgency: mild Incontinence Episodes #per day: 0 Ankle used: right Treatment Setting: 1 Feeling/ Response: sensory Comments: pt tolerated well  Performed By: Edwin Dada, CMA  Follow Up: monthly

## 2021-01-31 ENCOUNTER — Ambulatory Visit (INDEPENDENT_AMBULATORY_CARE_PROVIDER_SITE_OTHER): Payer: Medicare Other | Admitting: Urology

## 2021-01-31 ENCOUNTER — Other Ambulatory Visit: Payer: Self-pay

## 2021-01-31 DIAGNOSIS — N3946 Mixed incontinence: Secondary | ICD-10-CM

## 2021-01-31 DIAGNOSIS — R351 Nocturia: Secondary | ICD-10-CM

## 2021-01-31 MED ORDER — TOLTERODINE TARTRATE ER 4 MG PO CP24: 4.0000 mg | ORAL_CAPSULE | Freq: Every day | ORAL | 3 refills | Status: DC

## 2021-01-31 NOTE — Progress Notes (Signed)
01/31/2021 9:28 AM   Leana Gamer Scoles [birth year] [id number]  Referring provider: Steele Sizer, MD [address] [place],  [place] [address]  No chief complaint on file.   HPI: Iwas consulted by the above provider to assist the patient's urge incontinence worsening over many months. She has urge incontinence wearing 1 pad a day damp. She denies stress incontinence bedwetting. She voids every 2-3 hours and gets up 3-4 times a night   The patient had mild descensus at rest with grade 2 hypermobility the bladder neck and minimal movement. No stress incontinence. Small high grade 1 cystocele. She had a suburethral swelling nontender and she likely does not have a diverticulum  Patient has mild urge incontinence and moderate nighttime frequency I will reassess the patient on Myrbetriq. Desmopressin is a future option. Assessed on Myrbetriq in about 6 weeks. She said her primary most bothersome symptom is the nighttime frequency. Percutaneous tibial nerve stimulation is another option  Frequency and urgency improved during the day but still gets up at night similar amount. The patient was given oxybutynin ER 10 mg and Detrol LA 4 mg and reassess in 8 weeks. If she does not reach her goal I will discussed desmopressin recognizing potential cost issues and percutaneous tibial nerve stimulation for milder OAB symptoms and nighttime frequency  Frequency and urge incontinence and nocturia all better on tolterodine. Oxybutynin did not help. Gets up once a night depending on fluid intake.     Tolterodine not working as well and now she is getting up more at night which is affecting her quality life.   Due to insurance I did not offer desmopressin.  I went through percutaneous tibial nerve stimulation with template.  Handout given.  TOday Patient is on monthly percutaneous tibial nerve stimulation and Detrol.  Frequency stable.  Minimal key in the door  syndrome with urge incontinence during the day.  Gets up once a night instead of 6.  She is very pleased   PMH: Past Medical History:  Diagnosis Date  . Allergy   . Arthritis    knees, lower back  . Breast cancer ([facility]) right   2011, T1A N0, radiation  . Esophageal reflux   . Hodgkin's disease ([facility]) 2004  . Hypertension 2006  . Osteoporosis   . Personal history of malignant neoplasm of breast 2011   right breast  . Personal history of radiation therapy 2011   RIGHT lumpectomy w/ radiation    Surgical History: Past Surgical History:  Procedure Laterality Date  . ABDOMINAL HYSTERECTOMY    . BACK SURGERY    . BREAST BIOPSY Right 2011   radiation  . BREAST LUMPECTOMY Right 2011   w/ radiation  . BREAST SURGERY Right 2011   right breast lumpectomy,L/SN/R for right breast CA   . CATARACT EXTRACTION W/PHACO Left 06/10/2018   Procedure: CATARACT EXTRACTION PHACO AND INTRAOCULAR LENS PLACEMENT ([facility]) LEFT;  Surgeon: Eulogio Bear, MD;  Location: [facility];  Service: Ophthalmology;  Laterality: Left;  Diabetic - oral meds  . CATARACT EXTRACTION W/PHACO Right 07/15/2018   Procedure: CATARACT EXTRACTION PHACO AND INTRAOCULAR LENS PLACEMENT ([facility]) RIGHT;  Surgeon: Eulogio Bear, MD;  Location: [facility];  Service: Ophthalmology;  Laterality: Right;  . COLONOSCOPY  2003  . PORT A CATH REVISION    . REPLACEMENT TOTAL KNEE Right 10/03/2019   DR. Harlow Mares   . WRIST SURGERY  2005    Home Medications:  Allergies as of 01/31/2021  Reactions   Shrimp [shellfish Allergy] Hives   Augmentin [amoxicillin-pot Clavulanate] Nausea Only      Medication List       Accurate as of January 31, 2021  9:28 AM. If you have any questions, ask your nurse or doctor.        acetaminophen 650 MG CR tablet Commonly known as: TYLENOL Take 1,300 mg by mouth every 8 (eight) hours as needed for pain.   amLODipine-valsartan 5-160 MG tablet Commonly known as: Exforge Take 1  tablet by mouth daily.   aspirin 81 MG tablet Take 81 mg by mouth daily.   CALCIUM-MAGNESIUM PO Take 1 tablet by mouth daily.   cetirizine 10 MG tablet Commonly known as: ZYRTEC Take 10 mg by mouth daily.   denosumab 60 MG/ML Sosy injection Commonly known as: PROLIA Inject 60 mg into the skin every 6 (six) months.   diclofenac Sodium 1 % Gel Commonly known as: VOLTAREN Apply 2 g topically daily as needed.   gabapentin 100 MG capsule Commonly known as: NEURONTIN Take 1 capsule (100 mg total) by mouth at bedtime.   meloxicam 15 MG tablet Commonly known as: MOBIC Take 1 tablet (15 mg total) by mouth daily.   metoprolol succinate 50 MG 24 hr tablet Commonly known as: TOPROL-XL Take 1 tablet (50 mg total) by mouth daily. Take with or immediately following a meal.   multivitamin tablet Take 1 tablet by mouth daily.   nystatin powder Commonly known as: MYCOSTATIN/NYSTOP Apply topically 2 (two) times daily.   pantoprazole 40 MG tablet Commonly known as: PROTONIX Take 1 tablet (40 mg total) by mouth daily.   tolterodine 4 MG 24 hr capsule Commonly known as: DETROL LA Take 1 capsule (4 mg total) by mouth daily.   VITAMIN D-3 PO Take 1 tablet by mouth daily.       Allergies:  Allergies  Allergen Reactions  . Shrimp [Shellfish Allergy] Hives  . Augmentin [Amoxicillin-Pot Clavulanate] Nausea Only    Family History: Family History  Problem Relation Age of Onset  . Cancer Father   . Bone cancer Father   . Breast cancer Mother 12  . Stroke Mother   . Colon cancer Maternal Grandmother   . Breast cancer Cousin     Social History:  reports that she has never smoked. She has never used smokeless tobacco. She reports current alcohol use of about 2.0 standard drinks of alcohol per week. She reports that she does not use drugs.  ROS:                                        Physical Exam: There were no vitals taken for this visit.   Constitutional:  Alert and oriented, No acute distress. HEENT: [place] AT, moist mucus membranes.  Trachea midline, no masses. Cardiovascular: No clubbing, cyanosis, or edema.  Laboratory Data: Lab Results  Component Value Date   WBC 6.9 12/27/2020   HGB 12.3 12/27/2020   HCT 36.3 12/27/2020   MCV 90.8 12/27/2020   PLT 223 12/27/2020    Lab Results  Component Value Date   CREATININE 0.66 12/27/2020    No results found for: PSA  No results found for: TESTOSTERONE  Lab Results  Component Value Date   HGBA1C 5.5 07/06/2020    Urinalysis    Component Value Date/Time   COLORURINE YELLOW (A) 12/04/2018 0906   APPEARANCEUR Hazy (A)  12/29/2019 1504   LABSPEC 1.009 12/04/2018 0906   LABSPEC 1.026 03/19/2012 2152   PHURINE 6.0 12/04/2018 0906   GLUCOSEU Negative 12/29/2019 1504   GLUCOSEU Negative 03/19/2012 2152   HGBUR NEGATIVE 12/04/2018 0906   BILIRUBINUR Negative 12/29/2019 1504   BILIRUBINUR Negative 03/19/2012 2152   KETONESUR NEGATIVE 12/04/2018 0906   PROTEINUR Trace (A) 12/29/2019 1504   PROTEINUR NEGATIVE 12/04/2018 0906   NITRITE Negative 12/29/2019 1504   NITRITE NEGATIVE 12/04/2018 0906   LEUKOCYTESUR 1+ (A) 12/29/2019 1504   LEUKOCYTESUR Trace 03/19/2012 2152    Pertinent Imaging:   Assessment & Plan: Prescription renewed.  See in a year.  Continue with percutaneous tibial nerve stimulation  There are no diagnoses linked to this encounter.  No follow-ups on file.  [name], MD  [facility] [address], [place], [place] [address] [phone]

## 2021-02-14 ENCOUNTER — Other Ambulatory Visit: Payer: Self-pay

## 2021-02-14 ENCOUNTER — Encounter: Payer: Self-pay | Admitting: Urology

## 2021-02-14 ENCOUNTER — Ambulatory Visit (INDEPENDENT_AMBULATORY_CARE_PROVIDER_SITE_OTHER): Payer: Medicare Other | Admitting: Urology

## 2021-02-14 DIAGNOSIS — R32 Unspecified urinary incontinence: Secondary | ICD-10-CM | POA: Diagnosis not present

## 2021-02-14 NOTE — Progress Notes (Signed)
Patient ID: Mary Todd, female   DOB: 1946-05-20, 75 y.o.   MRN: [id number] PTNS  Session # monthly  Health & Social Factors: no change Caffeine: 1 Alcohol: 1 Daytime voids #per day: 3-4 Night-time voids #per night: 1 Urgency: milf Incontinence Episodes #per day: 0 Ankle used: right Treatment Setting: 3 Feeling/ Response: both Comments: patient tolerated well  Performed By: Edwin Dada, CMA  Assistant:   Follow Up: 1 month

## 2021-02-16 DIAGNOSIS — E041 Nontoxic single thyroid nodule: Secondary | ICD-10-CM | POA: Diagnosis not present

## 2021-02-16 DIAGNOSIS — S069X9A Unspecified intracranial injury with loss of consciousness of unspecified duration, initial encounter: Secondary | ICD-10-CM | POA: Diagnosis not present

## 2021-02-16 DIAGNOSIS — K219 Gastro-esophageal reflux disease without esophagitis: Secondary | ICD-10-CM | POA: Diagnosis not present

## 2021-02-16 DIAGNOSIS — R5383 Other fatigue: Secondary | ICD-10-CM | POA: Diagnosis not present

## 2021-02-16 DIAGNOSIS — R1110 Vomiting, unspecified: Secondary | ICD-10-CM | POA: Diagnosis not present

## 2021-02-16 DIAGNOSIS — R9431 Abnormal electrocardiogram [ECG] [EKG]: Secondary | ICD-10-CM | POA: Diagnosis not present

## 2021-02-16 DIAGNOSIS — R55 Syncope and collapse: Secondary | ICD-10-CM | POA: Diagnosis not present

## 2021-02-16 DIAGNOSIS — Z853 Personal history of malignant neoplasm of breast: Secondary | ICD-10-CM | POA: Diagnosis not present

## 2021-02-16 DIAGNOSIS — R519 Headache, unspecified: Secondary | ICD-10-CM | POA: Diagnosis not present

## 2021-02-16 DIAGNOSIS — Z043 Encounter for examination and observation following other accident: Secondary | ICD-10-CM | POA: Diagnosis not present

## 2021-02-16 DIAGNOSIS — I10 Essential (primary) hypertension: Secondary | ICD-10-CM | POA: Diagnosis not present

## 2021-02-16 DIAGNOSIS — Z923 Personal history of irradiation: Secondary | ICD-10-CM | POA: Diagnosis not present

## 2021-02-16 DIAGNOSIS — H318 Other specified disorders of choroid: Secondary | ICD-10-CM | POA: Diagnosis not present

## 2021-02-25 ENCOUNTER — Other Ambulatory Visit: Payer: Self-pay

## 2021-02-25 ENCOUNTER — Encounter: Payer: Self-pay | Admitting: Family Medicine

## 2021-02-25 ENCOUNTER — Ambulatory Visit (INDEPENDENT_AMBULATORY_CARE_PROVIDER_SITE_OTHER): Payer: Medicare Other | Admitting: Family Medicine

## 2021-02-25 VITALS — BP 122/74 | HR 89 | Temp 98.3°F | Resp 14 | Ht 63.0 in | Wt 227.1 lb

## 2021-02-25 DIAGNOSIS — G44309 Post-traumatic headache, unspecified, not intractable: Secondary | ICD-10-CM

## 2021-02-25 DIAGNOSIS — R55 Syncope and collapse: Secondary | ICD-10-CM

## 2021-02-25 LAB — CBC WITH DIFFERENTIAL/PLATELET
Absolute Monocytes: 612 cells/uL (ref 200–950)
Basophils Absolute: 41 cells/uL (ref 0–200)
Basophils Relative: 0.6 %
Eosinophils Absolute: 238 cells/uL (ref 15–500)
Eosinophils Relative: 3.5 %
HCT: 39 % (ref 35.0–45.0)
Hemoglobin: 13 g/dL (ref 11.7–15.5)
Lymphs Abs: 2734 cells/uL (ref 850–3900)
MCH: 31 pg (ref 27.0–33.0)
MCHC: 33.3 g/dL (ref 32.0–36.0)
MCV: 92.9 fL (ref 80.0–100.0)
MPV: 11.4 fL (ref 7.5–12.5)
Monocytes Relative: 9 %
Neutro Abs: 3176 cells/uL (ref 1500–7800)
Neutrophils Relative %: 46.7 %
Platelets: 243 10*3/uL (ref 140–400)
RBC: 4.2 10*6/uL (ref 3.80–5.10)
RDW: 13.1 % (ref 11.0–15.0)
Total Lymphocyte: 40.2 %
WBC: 6.8 10*3/uL (ref 3.8–10.8)

## 2021-02-25 NOTE — Progress Notes (Signed)
4/29/20222:15 PM  Mary Todd 24-Apr-1946, 75 y.o., female [id number]  Chief Complaint  Patient presents with  . Follow-up     [facility] ER visit, patient stated she passed out    HPI:   Patient is a 75 y.o. female with past medical history significant for breast cancer s/p radiation, anterolisthesis, thyroid nodule, hypertension, and GERD who presents today for ED follow up.  Seen in ED on 02/16/21 for vasovagal syncope Felt hot prior to passing out EKG and labs done WNL Had 5 sec positive LOC Occurred while visiting grandson in the ED who was having suture repair due to suicide attempt She did hit her head at that time She vomited and then returned to baseline CT of head and neck WNL Vital signs were stable at the time after event Was given fluids, zofran and tylenol  Has happened in the past Denies acute issues at this time    Depression screen [facility] 2/9 02/25/2021 01/04/2021 07/06/2020  Decreased Interest 0 0 0  Down, Depressed, Hopeless 0 0 0  PHQ - 2 Score 0 0 0  Altered sleeping - 0 0  Tired, decreased energy - 0 0  Change in appetite - 0 0  Feeling bad or failure about yourself  - 0 0  Trouble concentrating - 0 0  Moving slowly or fidgety/restless - 0 0  Suicidal thoughts - 0 0  PHQ-9 Score - 0 0  Difficult doing work/chores - - -  Some recent data might be hidden    Fall Risk  02/25/2021 01/04/2021 07/06/2020 04/13/2020 03/02/2020  Falls in the past year? 0 0 1 0 0  Number falls in past yr: 0 0 0 0 0  Injury with Fall? 0 0 0 - 0  Risk for fall due to : - - - - -  Follow up Falls evaluation completed - - - Falls evaluation completed     Allergies  Allergen Reactions  . Shrimp [Shellfish Allergy] Hives  . Augmentin [Amoxicillin-Pot Clavulanate] Nausea Only    Prior to Admission medications   Medication Sig Start Date End Date Taking? Authorizing Provider  acetaminophen (TYLENOL) 650 MG CR tablet Take 1,300 mg by mouth every 8 (eight) hours as needed for  pain.   Yes [provider]  amLODipine-valsartan (EXFORGE) 5-160 MG tablet Take 1 tablet by mouth daily. 01/04/21  Yes Alba Cory, MD  aspirin 81 MG tablet Take 81 mg by mouth daily.   Yes [provider]  CALCIUM-MAGNESIUM PO Take 1 tablet by mouth daily.   Yes [provider]  cetirizine (ZYRTEC) 10 MG tablet Take 10 mg by mouth daily.   Yes [provider]  Cholecalciferol (VITAMIN D-3 PO) Take 1 tablet by mouth daily.   Yes [provider]  denosumab (PROLIA) 60 MG/ML SOSY injection Inject 60 mg into the skin every 6 (six) months.   Yes Rosey Bath, MD  diclofenac Sodium (VOLTAREN) 1 % GEL Apply 2 g topically daily as needed.   Yes [provider]  gabapentin (NEURONTIN) 100 MG capsule Take 1 capsule (100 mg total) by mouth at bedtime. 08/24/20  Yes Felecia Shelling, DPM  meloxicam (MOBIC) 15 MG tablet Take 1 tablet (15 mg total) by mouth daily. 01/04/21  Yes Sowles, Danna Hefty, MD  metoprolol succinate (TOPROL-XL) 50 MG 24 hr tablet Take 1 tablet (50 mg total) by mouth daily. Take with or immediately following a meal. 07/06/20  Yes Carlynn Purl, Danna Hefty, MD  Multiple Vitamin (MULTIVITAMIN)  tablet Take 1 tablet by mouth daily.   Yes [provider]  nystatin (MYCOSTATIN/NYSTOP) powder Apply topically 2 (two) times daily. 12/27/20  Yes Corcoran, Ferdie Ping, MD  pantoprazole (PROTONIX) 40 MG tablet Take 1 tablet (40 mg total) by mouth daily. 01/04/21  Yes Sowles, Danna Hefty, MD  tolterodine (DETROL LA) 4 MG 24 hr capsule Take 1 capsule (4 mg total) by mouth daily. 01/31/21  Yes Alfredo Martinez, MD    Past Medical History:  Diagnosis Date  . Allergy   . Arthritis    knees, lower back  . Breast cancer ([facility]) right   2011, T1A N0, radiation  . Esophageal reflux   . Hodgkin's disease ([facility]) 2004  . Hypertension 2006  . Osteoporosis   . Personal history of malignant neoplasm of breast 2011   right breast  . Personal history of radiation  therapy 2011   RIGHT lumpectomy w/ radiation    Past Surgical History:  Procedure Laterality Date  . ABDOMINAL HYSTERECTOMY    . BACK SURGERY    . BREAST BIOPSY Right 2011   radiation  . BREAST LUMPECTOMY Right 2011   w/ radiation  . BREAST SURGERY Right 2011   right breast lumpectomy,L/SN/R for right breast CA   . CATARACT EXTRACTION W/PHACO Left 06/10/2018   Procedure: CATARACT EXTRACTION PHACO AND INTRAOCULAR LENS PLACEMENT ([facility]) LEFT;  Surgeon: Nevada Crane, MD;  [facility];  Service: Ophthalmology;  Laterality: Left;  Diabetic - oral meds  . CATARACT EXTRACTION W/PHACO Right 07/15/2018   Procedure: CATARACT EXTRACTION PHACO AND INTRAOCULAR LENS PLACEMENT ([facility]) RIGHT;  Surgeon: Nevada Crane, MD;  [facility];  Service: Ophthalmology;  Laterality: Right;  . COLONOSCOPY  2003  . PORT A CATH REVISION    . REPLACEMENT TOTAL KNEE Right 10/03/2019   DR. Odis Luster   . WRIST SURGERY  2005    Social History   Tobacco Use  . Smoking status: Never Smoker  . Smokeless tobacco: Never Used  . Tobacco comment: smoking cessation materials not required  Substance Use Topics  . Alcohol use: Yes    Alcohol/week: 2.0 standard drinks    Types: 2 Glasses of wine per week    Comment: occassionally    Family History  Problem Relation Age of Onset  . Cancer Father   . Bone cancer Father   . Breast cancer Mother 41  . Stroke Mother   . Colon cancer Maternal Grandmother   . Breast cancer Cousin     Review of Systems  Constitutional: Negative.   Respiratory: Negative.   Cardiovascular: Negative.   Gastrointestinal: Negative.   Neurological: Positive for headaches.     OBJECTIVE:  Today's Vitals   02/25/21 1345  BP: 122/74  Pulse: 89  Resp: 14  Temp: 98.3 F (36.8 C)  TempSrc: Oral  SpO2: 98%  Weight: 227 lb 1.6 oz (103 kg)  Height: 5\' 3"  (1.6 m)   Body mass index is 40.23 kg/m.   Physical Exam Constitutional:       General: She is not in acute distress.    Appearance: Normal appearance. She is not ill-appearing.  HENT:     Head: Normocephalic.  Cardiovascular:     Rate and Rhythm: Normal rate and regular rhythm.     Pulses: Normal pulses.     Heart sounds: Normal heart sounds. No murmur heard. No friction rub. No gallop.   Pulmonary:     Effort: Pulmonary effort is normal. No respiratory distress.  Breath sounds: Normal breath sounds. No stridor. No wheezing, rhonchi or rales.  Abdominal:     General: Bowel sounds are normal.     Palpations: Abdomen is soft.     Tenderness: There is no abdominal tenderness.  Musculoskeletal:     Right lower leg: No edema.     Left lower leg: No edema.  Skin:    General: Skin is warm and dry.  Neurological:     Mental Status: She is alert and oriented to person, place, and time.  Psychiatric:        Mood and Affect: Mood normal.        Behavior: Behavior normal.     No results found for this or any previous visit (from the past 24 hour(s)).  No results found.   ASSESSMENT and PLAN  Problem List Items Addressed This Visit   None   Visit Diagnoses    Vasovagal syncope    -  Primary   Relevant Orders   Comprehensive metabolic panel   CBC with Differential   Post-concussion headache          Plan . Continue tylenol for headaches . Will follow up with lab results   Return in about 3 months (around 05/27/2021).    Macario Carls Wynton Hufstetler, FNP-BC [facility]

## 2021-02-25 NOTE — Patient Instructions (Addendum)
Post-Concussion Syndrome  A concussion is a brain injury. Post-concussion syndrome is when symptoms last longer than normal after a head injury. What are the causes? The cause of this condition is not known. It can happen if your head injury was mild or very bad. What increases the risk?  Being female.  Being young.  Having had a head injury before.  Being sad (depressed) or feeling worried or nervous (having anxiety).  Fainting when you got your concussion or not being able to remember it.  Having many symptoms or very bad symptoms at the time of your injury. What are the signs or symptoms? After a head injury, you may have physical symptoms, such as:  Having headaches.  Feeling tired.  Feeling dizzy.  Feeling weak.  Having trouble seeing.  Having trouble in bright lights.  Having trouble hearing.  Having problems balancing. You may also have mental and emotional symptoms, such as:  Not being able to remember things.  Not being able to focus.  Having trouble sleeping.  Feeling grouchy (irritable).  Feeling worried.  Feeling sad.  Having trouble learning new things. These can last from weeks to months. How is this treated? Treatment for this condition may depend on your symptoms. Symptoms usually go away on their own over time. If you need treatment, it may include:  Taking medicines.  Resting your brain and body for a few days.  Doing therapy to help you recover (rehabilitation therapy). This may include: ? Physical or occupational therapy. This may include exercises. ? Talking to a counselor. ? Speech therapy. ? Therapy to help your eyes. Follow these instructions at home: Medicines  Take all medicines only as told by your doctor.  Avoid pain medicines that have opioids in them. Ask your doctor which pain medicine to take. Activity  Limit activities as told by your doctor. This may include not doing the following: ? Homework. ? Job-related  work. ? Hard thinking. ? Watching TV. ? Using a computer or phone. ? Puzzles. ? Exercise. ? Sports.  Slowly return to your normal activity as told by your doctor.  Stop an activity if you have symptoms.  Rest. Try to: ? Sleep 7-9 hours each night. ? Take naps or breaks when you feel tired during the day.  Do not do anything that may cause you to get hurt again right away. General instructions  Do not drink alcohol until your doctor says that you can.  Keep track of your symptoms.  Keep all follow-up visits as told by your doctor. This is important.   Contact a doctor if:  You do not improve.  You get worse.  You have another injury. Get help right away if:  You have a very bad headache or a headache that gets worse.  You feel confused.  You feel very sleepy.  You faint.  You vomit.  You feel weak in any part of your body.  You feel numb in any part of your body.  You start shaking (have a seizure).  You have trouble talking. Summary  This condition is when symptoms last longer than normal after a head injury.  Limit your activities after your injury. Slowly return to normal activities as told by your doctor.  Rest.  Do not drink alcohol and avoid pain medicines that have opioids in them.  Call your doctor if your symptoms get worse. This information is not intended to replace advice given to you by your health care provider. Make sure you  discuss any questions you have with your health care provider. Document Revised: 10/08/2019 Document Reviewed: 10/08/2019 Elsevier Patient Education  2021 [employer].  Syncope Syncope is when you pass out (faint) for a short time. It is caused by a sudden decrease in blood flow to the brain. Signs that you may be about to pass out include:  Feeling dizzy or light-headed.  Feeling sick to your stomach (nauseous).  Seeing all white or all black.  Having cold, clammy skin. If you pass out, get help right  away. Call your local emergency services (911 in the [place] Do not drive yourself to the hospital. Follow these instructions at home: Watch for any changes in your symptoms. Take these actions to stay safe and help with your symptoms: Lifestyle  Do not drive, use machinery, or play sports until your doctor says it is okay.  Do not drink alcohol.  Do not use any products that contain nicotine or tobacco, such as cigarettes and e-cigarettes. If you need help quitting, ask your doctor.  Drink enough fluid to keep your pee (urine) pale yellow. General instructions  Take over-the-counter and prescription medicines only as told by your doctor.  If you are taking blood pressure or heart medicine, sit up and stand up slowly. Spend a few minutes getting ready to sit and then stand. This can help you feel less dizzy.  Have someone stay with you until you feel stable.  If you start to feel like you might pass out, lie down right away and raise (elevate) your feet above the level of your heart. Breathe deeply and steadily. Wait until all of the symptoms are gone.  Keep all follow-up visits as told by your doctor. This is important. Get help right away if:  You have a very bad headache.  You pass out once or more than once.  You have pain in your chest, belly, or back.  You have a very fast or uneven heartbeat (palpitations).  It hurts to breathe.  You are bleeding from your mouth or your bottom (rectum).  You have black or tarry poop (stool).  You have jerky movements that you cannot control (seizure).  You are confused.  You have trouble walking.  You are very weak.  You have vision problems. These symptoms may be an emergency. Do not wait to see if the symptoms will go away. Get medical help right away. Call your local emergency services (911 in the [place] Do not drive yourself to the hospital. Summary  Syncope is when you pass out (faint) for a short time. It is caused by a  sudden decrease in blood flow to the brain.  Signs that you may be about to faint include feeling dizzy, light-headed, or sick to your stomach, seeing all white or all black, or having cold, clammy skin.  If you start to feel like you might pass out, lie down right away and raise (elevate) your feet above the level of your heart. Breathe deeply and steadily. Wait until all of the symptoms are gone. This information is not intended to replace advice given to you by your health care provider. Make sure you discuss any questions you have with your health care provider. Document Revised: 11/26/2019 Document Reviewed: 11/28/2017 Elsevier Patient Education  2021 [employer].

## 2021-03-07 DIAGNOSIS — H6123 Impacted cerumen, bilateral: Secondary | ICD-10-CM | POA: Diagnosis not present

## 2021-03-07 DIAGNOSIS — H903 Sensorineural hearing loss, bilateral: Secondary | ICD-10-CM | POA: Diagnosis not present

## 2021-03-19 ENCOUNTER — Ambulatory Visit
Admission: EM | Admit: 2021-03-19 | Discharge: 2021-03-19 | Disposition: A | Discharge status: Home / Self Care | Payer: Medicare Other | Attending: Emergency Medicine | Admitting: Emergency Medicine

## 2021-03-19 ENCOUNTER — Other Ambulatory Visit: Payer: Self-pay

## 2021-03-19 DIAGNOSIS — J069 Acute upper respiratory infection, unspecified: Secondary | ICD-10-CM | POA: Diagnosis not present

## 2021-03-19 DIAGNOSIS — B372 Candidiasis of skin and nail: Secondary | ICD-10-CM

## 2021-03-19 MED ORDER — NYSTATIN 100000 UNIT/GM EX CREA: TOPICAL_CREAM | CUTANEOUS | 1 refills | Status: DC

## 2021-03-19 MED ORDER — IPRATROPIUM BROMIDE 0.06 % NA SOLN: 2.0000 | Freq: Four times a day (QID) | NASAL | 12 refills | Status: DC

## 2021-03-19 MED ORDER — PROMETHAZINE-DM 6.25-15 MG/5ML PO SYRP: 5.0000 mL | ORAL_SOLUTION | Freq: Four times a day (QID) | ORAL | 0 refills | Status: DC | PRN

## 2021-03-19 NOTE — ED Provider Notes (Signed)
[facility]    CSN: [id number] Arrival date & time: 03/19/21  1517      History   Chief Complaint Chief Complaint  Patient presents with  . Rash  . Cough    HPI Mary Todd is a 75 y.o. female.   HPI   75 year old female here for evaluation of multiple complaints.  Patient's first complaint is that she has developed a rash in the middle of her chest and underneath both of her breasts that started today and she describes it as burning and itching with a slimy coating.  Her second complaint is that she has had nasal congestion, scratchy throat, and a nonproductive cough for the past 2 weeks.  She denies runny nose, shortness of breath or wheezing, or GI complaints.  Past Medical History:  Diagnosis Date  . Allergy   . Arthritis    knees, lower back  . Breast cancer (Mary Todd) right   2011, T1A N0, radiation  . Esophageal reflux   . Hodgkin's disease (Mary Todd) 2004  . Hypertension 2006  . Osteoporosis   . Personal history of malignant neoplasm of breast 2011   right breast  . Personal history of radiation therapy 2011   RIGHT lumpectomy w/ radiation    Patient Active Problem List   Diagnosis Date Noted  . Total knee replacement status, right 10/03/2019  . Encounter for screening mammogram for breast cancer 07/02/2018  . Anterolisthesis 08/06/2017  . DDD (degenerative disc disease), lumbosacral 08/06/2017  . Chondromalacia patellae 07/03/2017  . Osteoporosis 12/14/2016  . Morbid obesity (Mary Todd) 07/17/2016  . Spinal stenosis in cervical region 05/22/2016  . Thyroid nodule 05/22/2016  . GERD without esophagitis 03/16/2016  . Neck muscle spasm 03/16/2016  . History of back surgery 03/16/2016  . Osteoarthritis of both knees 03/16/2016  . Large breasts 03/16/2016  . Hypertension, benign 03/16/2016  . Perennial allergic rhinitis with seasonal variation 03/16/2016  . History of pneumococcal pneumonia 09/20/2015  . Hodgkin disease (Mary Todd) 07/15/2014  .  Breast cancer, right (Mary Todd) 06/26/2013    Past Surgical History:  Procedure Laterality Date  . ABDOMINAL HYSTERECTOMY    . BACK SURGERY    . BREAST BIOPSY Right 2011   radiation  . BREAST LUMPECTOMY Right 2011   w/ radiation  . BREAST SURGERY Right 2011   right breast lumpectomy,L/SN/R for right breast CA   . CATARACT EXTRACTION W/PHACO Left 06/10/2018   Procedure: CATARACT EXTRACTION PHACO AND INTRAOCULAR LENS PLACEMENT ([place]) LEFT;  Surgeon: Eulogio Bear, MD;  Location: [facility];  Service: Ophthalmology;  Laterality: Left;  Diabetic - oral meds  . CATARACT EXTRACTION W/PHACO Right 07/15/2018   Procedure: CATARACT EXTRACTION PHACO AND INTRAOCULAR LENS PLACEMENT ([place]) RIGHT;  Surgeon: Eulogio Bear, MD;  Location: [facility];  Service: Ophthalmology;  Laterality: Right;  . COLONOSCOPY  2003  . PORT A CATH REVISION    . REPLACEMENT TOTAL KNEE Right 10/03/2019   DR. Harlow Mares   . WRIST SURGERY  2005    OB History    Gravida  2   Para  2   Term      Preterm      AB      Living  2     SAB      IAB      Ectopic      Multiple      Live Births           Obstetric Comments  1st Menstrual Cycle:  12 1st Pregnancy:  22.         Home Medications    Prior to Admission medications   Medication Sig Start Date End Date Taking? Authorizing Provider  acetaminophen (TYLENOL) 650 MG CR tablet Take 1,300 mg by mouth every 8 (eight) hours as needed for pain.   Yes [provider]  amLODipine-valsartan (EXFORGE) 5-160 MG tablet Take 1 tablet by mouth daily. 01/04/21  Yes Alba CorySowles, Krichna, MD  aspirin 81 MG tablet Take 81 mg by mouth daily.   Yes [provider]  CALCIUM-MAGNESIUM PO Take 1 tablet by mouth daily.   Yes [provider]  cetirizine (ZYRTEC) 10 MG tablet Take 10 mg by mouth daily.   Yes [provider]  Cholecalciferol (VITAMIN D-3 PO) Take 1 tablet by mouth daily.   Yes [provider]   denosumab (PROLIA) 60 MG/ML SOSY injection Inject 60 mg into the skin every 6 (six) months.   Yes Rosey Bathorcoran, Melissa C, MD  diclofenac Sodium (VOLTAREN) 1 % GEL Apply 2 g topically daily as needed.   Yes [provider]  gabapentin (NEURONTIN) 100 MG capsule Take 1 capsule (100 mg total) by mouth at bedtime. 08/24/20  Yes Felecia ShellingEvans, Brent M, DPM  ipratropium (ATROVENT) 0.06 % nasal spray Place 2 sprays into both nostrils 4 (four) times daily. 03/19/21  Yes Becky Augustayan, Rosmery Duggin, NP  meloxicam (MOBIC) 15 MG tablet Take 1 tablet (15 mg total) by mouth daily. 01/04/21  Yes Sowles, Danna HeftyKrichna, MD  metoprolol succinate (TOPROL-XL) 50 MG 24 hr tablet Take 1 tablet (50 mg total) by mouth daily. Take with or immediately following a meal. 07/06/20  Yes Sowles, Danna HeftyKrichna, MD  Multiple Vitamin (MULTIVITAMIN) tablet Take 1 tablet by mouth daily.   Yes [provider]  nystatin cream (MYCOSTATIN) Apply to affected area 2 times daily 03/19/21  Yes Becky Augustayan, Audel Coakley, NP  pantoprazole (PROTONIX) 40 MG tablet Take 1 tablet (40 mg total) by mouth daily. 01/04/21  Yes Sowles, Danna HeftyKrichna, MD  promethazine-dextromethorphan (PROMETHAZINE-DM) 6.25-15 MG/5ML syrup Take 5 mLs by mouth 4 (four) times daily as needed. 03/19/21  Yes Becky Augustayan, Samiyyah Moffa, NP  tolterodine (DETROL LA) 4 MG 24 hr capsule Take 1 capsule (4 mg total) by mouth daily. 01/31/21  Yes MacDiarmid, Lorin PicketScott, MD    Family History Family History  Problem Relation Age of Onset  . Cancer Father   . Bone cancer Father   . Breast cancer Mother 1948  . Stroke Mother   . Colon cancer Maternal Grandmother   . Breast cancer Cousin     Social History Social History   Tobacco Use  . Smoking status: Never Smoker  . Smokeless tobacco: Never Used  . Tobacco comment: smoking cessation materials not required  Vaping Use  . Vaping Use: Never used  Substance Use Topics  . Alcohol use: Yes    Alcohol/week: 2.0 standard drinks    Types: 2 Glasses of wine per week    Comment:  occassionally  . Drug use: No     Allergies   Shrimp [shellfish allergy] and Augmentin [amoxicillin-pot clavulanate]   Review of Systems Review of Systems  Constitutional: Negative for activity change, appetite change and fever.  HENT: Positive for congestion and sore throat. Negative for rhinorrhea.   Respiratory: Positive for cough. Negative for shortness of breath and wheezing.   Gastrointestinal: Negative for diarrhea, nausea and vomiting.  Skin: Positive for rash.  Hematological: Negative.   Psychiatric/Behavioral: Negative.      Physical Exam Triage  Vital Signs ED Triage Vitals  Enc Vitals Group     BP 03/19/21 1532 (!) 129/92     Pulse Rate 03/19/21 1532 86     Resp 03/19/21 1532 18     Temp 03/19/21 1532 98.3 F (36.8 C)     Temp Source 03/19/21 1532 Oral     SpO2 03/19/21 1532 97 %     Weight 03/19/21 1530 220 lb (99.8 kg)     Height 03/19/21 1530 5\' 4"  (1.626 m)     Head Circumference --      Peak Flow --      Pain Score 03/19/21 1530 9     Pain Loc --      Pain Edu? --      Excl. in [place]? --    No data found.  Updated Vital Signs BP (!) 129/92 (BP Location: Left Arm)   Pulse 86   Temp 98.3 F (36.8 C) (Oral)   Resp 18   Ht 5\' 4"  (1.626 m)   Wt 220 lb (99.8 kg)   SpO2 97%   BMI 37.76 kg/m   Visual Acuity Right Eye Distance:   Left Eye Distance:   Bilateral Distance:    Right Eye Near:   Left Eye Near:    Bilateral Near:     Physical Exam Vitals and nursing note reviewed.  Constitutional:      General: She is not in acute distress.    Appearance: Normal appearance. She is normal weight. She is not ill-appearing.  HENT:     Head: Normocephalic and atraumatic.     Right Ear: Tympanic membrane, ear canal and external ear normal. There is no impacted cerumen.     Left Ear: Tympanic membrane, ear canal and external ear normal. There is no impacted cerumen.     Nose: Congestion and rhinorrhea present.     Mouth/Throat:     Mouth: Mucous  membranes are moist.     Pharynx: Oropharynx is clear. Posterior oropharyngeal erythema present.  Cardiovascular:     Rate and Rhythm: Normal rate and regular rhythm.     Pulses: Normal pulses.     Heart sounds: Normal heart sounds. No murmur heard. No gallop.   Pulmonary:     Effort: Pulmonary effort is normal.     Breath sounds: Normal breath sounds. No wheezing, rhonchi or rales.  Musculoskeletal:     Cervical back: Normal range of motion and neck supple.  Lymphadenopathy:     Cervical: No cervical adenopathy.  Skin:    General: Skin is warm.     Capillary Refill: Capillary refill takes less than 2 seconds.     Findings: Erythema and rash present.  Neurological:     General: No focal deficit present.     Mental Status: She is alert and oriented to person, place, and time.  Psychiatric:        Mood and Affect: Mood normal.        Behavior: Behavior normal.        Thought Content: Thought content normal.        Judgment: Judgment normal.      UC Treatments / Results  Labs (all labs ordered are listed, but only abnormal results are displayed) Labs Reviewed - No data to display  EKG   Radiology No results found.  Procedures Procedures (including critical care time)  Medications Ordered in UC Medications - No data to display  Initial Impression / Assessment and Plan / UC  Course  I have reviewed the triage vital signs and the nursing notes.  Pertinent labs & imaging results that were available during my care of the patient were reviewed by me and considered in my medical decision making (see chart for details).   Patient is a very pleasant nontoxic-appearing 75 year old female here for evaluation of a rash in the middle of her chest under both breasts that is burning and itchy with a slimy coating.  She is also complaining of nasal congestion with a scratchy throat and nonproductive cough that is been going on for 2 weeks.  Physical exam reveals pearly gray tympanic  membranes bilaterally with a normal light reflex and clear external auditory canals.  Nasal mucosa is mildly erythematous and edematous with clear nasal discharge.  Oropharyngeal exam reveals posterior oropharyngeal erythema with cobblestoning and clear postnasal drip.  No cervical lymphadenopathy on exam.  Cardiopulmonary exam is benign.  Examination of patient's breast reveals an erythematous area between her breasts and under the medial aspect of both breasts.  The area is flat.  Patient's exam is consistent with viral URI with cough and also skin yeast infection under both breasts.  We will treat patient with Atrovent nasal spray, Promethazine DM cough syrup and she says Tessalon Perles do not work for her, and topical nystatin cream.  Final Clinical Impressions(s) / UC Diagnoses   Final diagnoses:  Viral URI with cough  Candidal intertrigo     Discharge Instructions     Use the Atrovent nasal spray, 2 squirts in each nostril every 6 hours, as needed for runny nose and postnasal drip.  Use the Promethazine DM cough syrup at bedtime for cough and congestion.  It will make you drowsy so do not take it during the day.  Apply the nystatin cream to the rash between her breasts twice daily until resolved.  Return for reevaluation or see your primary care provider for any new or worsening symptoms.     ED Prescriptions    Medication Sig Dispense Auth. Provider   nystatin cream (MYCOSTATIN) Apply to affected area 2 times daily 30 g Margarette Canada, NP   ipratropium (ATROVENT) 0.06 % nasal spray Place 2 sprays into both nostrils 4 (four) times daily. 15 mL Margarette Canada, NP   promethazine-dextromethorphan (PROMETHAZINE-DM) 6.25-15 MG/5ML syrup Take 5 mLs by mouth 4 (four) times daily as needed. 118 mL Margarette Canada, NP     PDMP not reviewed this encounter.   Margarette Canada, NP 03/19/21 1553

## 2021-03-19 NOTE — Discharge Instructions (Addendum)
Use the Atrovent nasal spray, 2 squirts in each nostril every 6 hours, as needed for runny nose and postnasal drip.  Use the Promethazine DM cough syrup at bedtime for cough and congestion.  It will make you drowsy so do not take it during the day.  Apply the nystatin cream to the rash between her breasts twice daily until resolved.  Return for reevaluation or see your primary care provider for any new or worsening symptoms.

## 2021-03-19 NOTE — ED Triage Notes (Signed)
Pt c/o rash since this morning, between her breasts and under her breasts. Pt states the area is itching and burning. She has applied nystatin powder but this has not helped much. Pt also reports a cough for 2 weeks. She denies nasal congestion, f/n/v/d or other symptoms.

## 2021-03-21 ENCOUNTER — Ambulatory Visit: Payer: Medicare Other | Admitting: Urology

## 2021-03-22 ENCOUNTER — Ambulatory Visit: Payer: Medicare Other

## 2021-03-24 DIAGNOSIS — Z23 Encounter for immunization: Secondary | ICD-10-CM | POA: Diagnosis not present

## 2021-03-31 ENCOUNTER — Ambulatory Visit (INDEPENDENT_AMBULATORY_CARE_PROVIDER_SITE_OTHER): Payer: Medicare Other

## 2021-03-31 ENCOUNTER — Other Ambulatory Visit: Payer: Self-pay | Admitting: Family Medicine

## 2021-03-31 DIAGNOSIS — Z0000 Encounter for general adult medical examination without abnormal findings: Secondary | ICD-10-CM

## 2021-03-31 MED ORDER — NYSTATIN 100000 UNIT/GM EX POWD: 1.0000 "application " | Freq: Three times a day (TID) | CUTANEOUS | 0 refills | Status: DC

## 2021-03-31 NOTE — Progress Notes (Signed)
Subjective:   Mary Todd is a 75 y.o. female who presents for Medicare Annual (Subsequent) preventive examination.  Virtual Visit via Telephone Note  I connected with  Mary Todd on 03/31/21 at  3:30 PM EDT by telephone and verified that I am speaking with the correct person using two identifiers.  Location: Patient: home Provider: [place] Persons participating in the virtual visit: [place]   I discussed the limitations, risks, security and privacy concerns of performing an evaluation and management service by telephone and the availability of in person appointments. The patient expressed understanding and agreed to proceed.  Interactive audio and video telecommunications were attempted between this nurse and patient, however failed, due to patient having technical difficulties OR patient did not have access to video capability.  We continued and completed visit with audio only.  Some vital signs may be absent or patient reported.   Clemetine Marker, LPN    Review of Systems     Cardiac Risk Factors include: advanced age (>34men, >55 women);hypertension;obesity (BMI >30kg/m2)     Objective:    There were no vitals filed for this visit. There is no height or weight on file to calculate BMI.  Advanced Directives 03/31/2021 01/01/2020 12/25/2019 03/05/2019 12/27/2018 12/04/2018 07/31/2018  Does Patient Have a Medical Advance Directive? No No No No Yes No No  Type of Advance Directive - - - - Living will;Healthcare Power of Attorney - -  Does patient want to make changes to medical advance directive? - - - - - - -  Copy of [place] in Chart? - - - - No - copy requested - -  Would patient like information on creating a medical advance directive? Yes (MAU/Ambulatory/Procedural Areas - Information given) No - Patient declined No - Patient declined No - Patient declined - - -    Current Medications (verified) Outpatient Encounter  Medications as of 03/31/2021  Medication Sig  . acetaminophen (TYLENOL) 650 MG CR tablet Take 1,300 mg by mouth every 8 (eight) hours as needed for pain.  Marland Kitchen amLODipine-valsartan (EXFORGE) 5-160 MG tablet Take 1 tablet by mouth daily.  Marland Kitchen aspirin 81 MG tablet Take 81 mg by mouth daily.  Marland Kitchen CALCIUM-MAGNESIUM PO Take 1 tablet by mouth daily.  . cetirizine (ZYRTEC) 10 MG tablet Take 10 mg by mouth daily.  . Cholecalciferol (VITAMIN D-3 PO) Take 1 tablet by mouth daily.  Marland Kitchen denosumab (PROLIA) 60 MG/ML SOSY injection Inject 60 mg into the skin every 6 (six) months.  . gabapentin (NEURONTIN) 100 MG capsule Take 1 capsule (100 mg total) by mouth at bedtime.  Marland Kitchen ipratropium (ATROVENT) 0.06 % nasal spray Place 2 sprays into both nostrils 4 (four) times daily.  . meloxicam (MOBIC) 15 MG tablet Take 1 tablet (15 mg total) by mouth daily.  . metoprolol succinate (TOPROL-XL) 50 MG 24 hr tablet Take 1 tablet (50 mg total) by mouth daily. Take with or immediately following a meal.  . Multiple Vitamin (MULTIVITAMIN) tablet Take 1 tablet by mouth daily.  Marland Kitchen nystatin cream (MYCOSTATIN) Apply to affected area 2 times daily  . promethazine-dextromethorphan (PROMETHAZINE-DM) 6.25-15 MG/5ML syrup Take 5 mLs by mouth 4 (four) times daily as needed. (Patient taking differently: Take 5 mLs by mouth 4 (four) times daily as needed.)  . tolterodine (DETROL LA) 4 MG 24 hr capsule Take 1 capsule (4 mg total) by mouth daily.  . diclofenac Sodium (VOLTAREN) 1 % GEL Apply 2 g topically daily as needed. (  Patient not taking: Reported on 03/31/2021)  . pantoprazole (PROTONIX) 40 MG tablet Take 1 tablet (40 mg total) by mouth daily.   No facility-administered encounter medications on file as of 03/31/2021.    Allergies (verified) Shrimp [shellfish allergy] and Augmentin [amoxicillin-pot clavulanate]   History: Past Medical History:  Diagnosis Date  . Allergy   . Arthritis    knees, lower back  . Breast cancer ([place]) right   2011,  T1A N0, radiation  . Esophageal reflux   . Hodgkin's disease ([place]) 2004  . Hypertension 2006  . Osteoporosis   . Personal history of malignant neoplasm of breast 2011   right breast  . Personal history of radiation therapy 2011   RIGHT lumpectomy w/ radiation   Past Surgical History:  Procedure Laterality Date  . ABDOMINAL HYSTERECTOMY    . BACK SURGERY    . BREAST BIOPSY Right 2011   radiation  . BREAST LUMPECTOMY Right 2011   w/ radiation  . BREAST SURGERY Right 2011   right breast lumpectomy,L/SN/R for right breast CA   . CATARACT EXTRACTION W/PHACO Left 06/10/2018   Procedure: CATARACT EXTRACTION PHACO AND INTRAOCULAR LENS PLACEMENT ([place]) LEFT;  Surgeon: Eulogio Bear, MD;  Location: [facility];  Service: Ophthalmology;  Laterality: Left;  Diabetic - oral meds  . CATARACT EXTRACTION W/PHACO Right 07/15/2018   Procedure: CATARACT EXTRACTION PHACO AND INTRAOCULAR LENS PLACEMENT ([place]) RIGHT;  Surgeon: Eulogio Bear, MD;  Location: [facility];  Service: Ophthalmology;  Laterality: Right;  . COLONOSCOPY  2003  . PORT A CATH REVISION    . REPLACEMENT TOTAL KNEE Right 10/03/2019   DR. Harlow Mares   . WRIST SURGERY  2005   Family History  Problem Relation Age of Onset  . Cancer Father   . Bone cancer Father   . Breast cancer Mother 39  . Stroke Mother   . Colon cancer Maternal Grandmother   . Breast cancer Cousin    Social History   Socioeconomic History  . Marital status: Married    Spouse name: Mary Todd  . Number of children: 2  . Years of education: some college  . Highest education level: 12th grade  Occupational History  . Occupation: Retired  Tobacco Use  . Smoking status: Never Smoker  . Smokeless tobacco: Never Used  . Tobacco comment: smoking cessation materials not required  Vaping Use  . Vaping Use: Never used  Substance and Sexual Activity  . Alcohol use: Yes    Alcohol/week: 2.0 standard drinks    Types: 2 Glasses of wine per week     Comment: occassionally  . Drug use: No  . Sexual activity: Not Currently  Other Topics Concern  . Not on file  Social History Narrative  . Not on file   Social Determinants of Health   Financial Resource Strain: Low Risk   . Difficulty of Paying Living Expenses: Not hard at all  Food Insecurity: No Food Insecurity  . Worried About Charity fundraiser in the Last Year: Never true  . Ran Out of Food in the Last Year: Never true  Transportation Needs: No Transportation Needs  . Lack of Transportation (Medical): No  . Lack of Transportation (Non-Medical): No  Physical Activity: Insufficiently Active  . Days of Exercise per Week: 2 days  . Minutes of Exercise per Session: 30 min  Stress: No Stress Concern Present  . Feeling of Stress : Not at all  Social Connections: Moderately Integrated  . Frequency  of Communication with Friends and Family: More than three times a week  . Frequency of Social Gatherings with Friends and Family: Three times a week  . Attends Religious Services: More than 4 times per year  . Active Member of Clubs or Organizations: No  . Attends Archivist Meetings: Never  . Marital Status: Married    Tobacco Counseling Counseling given: Not Answered Comment: smoking cessation materials not required   Clinical Intake:  Pre-visit preparation completed: Yes  Pain : No/denies pain     Nutritional Risks: None Diabetes: No  How often do you need to have someone help you when you read instructions, pamphlets, or other written materials from your doctor or pharmacy?: 1 - Never    Interpreter Needed?: No  Information entered by :: Clemetine Marker LPN   Activities of Daily Living In your present state of health, do you have any difficulty performing the following activities: 03/31/2021 02/25/2021  Hearing? N N  Comment declines hearing aids -  Vision? N N  Difficulty concentrating or making decisions? N N  Walking or climbing stairs? Y Y   Dressing or bathing? N N  Doing errands, shopping? N N  Preparing Food and eating ? N -  Using the Toilet? N -  In the past six months, have you accidently leaked urine? N -  Do you have problems with loss of bowel control? N -  Managing your Medications? N -  Managing your Finances? N -  Housekeeping or managing your Housekeeping? N -  Some recent data might be hidden    Patient Care Team: Steele Sizer, MD as PCP - General (Family Medicine) Lequita Asal, MD as Referring Physician (Hematology and Oncology) Gabriel Carina Betsey Holiday, MD as Consulting Physician (Endocrinology) Laneta Simmers as Physician Assistant (Urology) Edrick Kins, DPM as Consulting Physician (Podiatry) Lovell Sheehan, MD as Consulting Physician (Orthopedic Surgery)  Indicate any recent Medical Services you may have received from other than [facility] providers in the past year (date may be approximate).     Assessment:   This is a routine wellness examination for Julie.  Hearing/Vision screen  Hearing Screening   125Hz  250Hz  500Hz  1000Hz  2000Hz  3000Hz  4000Hz  6000Hz  8000Hz   Right ear:           Left ear:           Comments: Pt denies hearing difficulty  Vision Screening Comments: [facility]  Dietary issues and exercise activities discussed: Current Exercise Habits: Home exercise routine, Type of exercise: walking, Time (Minutes): 30, Frequency (Times/Week): 2, Weekly Exercise (Minutes/Week): 60, Intensity: Mild, Exercise limited by: None identified  Goals Addressed            This Visit's Progress   . DIET - INCREASE WATER INTAKE   Not on track    Recommend to drink at least 6-8 8oz glasses of water per day.      Depression Screen PHQ 2/9 Scores 03/31/2021 02/25/2021 01/04/2021 07/06/2020 07/06/2020 04/13/2020 03/02/2020  PHQ - 2 Score 0 0 0 0 0 0 2  PHQ- 9 Score - - 0 0 - 0 7    Fall Risk Fall Risk  03/31/2021 02/25/2021 01/04/2021 07/06/2020 04/13/2020  Falls in  the past year? 0 0 0 1 0  Number falls in past yr: 0 0 0 0 0  Injury with Fall? 0 0 0 0 -  Risk for fall due to : No Fall Risks - - - -  Follow up - Falls evaluation completed - - -    FALL RISK PREVENTION PERTAINING TO THE HOME:  Any stairs in or around the home? Yes  If so, are there any without handrails? No  Home free of loose throw rugs in walkways, pet beds, electrical cords, etc? Yes  Adequate lighting in your home to reduce risk of falls? Yes   ASSISTIVE DEVICES UTILIZED TO PREVENT FALLS:  Life alert? No  Use of a cane, walker or w/c? No  Grab bars in the bathroom? No  Shower chair or bench in shower? No  Elevated toilet seat or a handicapped toilet? No   TIMED UP AND GO:  Was the test performed? No . Telephonic visit.   Cognitive Function: Normal cognitive status assessed by direct observation by this Nurse Health Advisor. No abnormalities found.       6CIT Screen 01/01/2020 12/27/2018 12/25/2017  What Year? 0 points 0 points 0 points  What month? 0 points 0 points 0 points  What time? 0 points 0 points 0 points  Count back from 20 0 points 0 points 0 points  Months in reverse 0 points 2 points 0 points  Repeat phrase 0 points 0 points 0 points  Total Score 0 2 0    Immunizations Immunization History  Administered Date(s) Administered  . Fluad Quad(high Dose 65+) 08/11/2019, 07/06/2020  . Influenza, High Dose Seasonal PF 09/20/2015, 07/17/2016, 07/03/2017, 07/03/2018  . Influenza-Unspecified 07/30/2013  . Moderna Sars-Covid-2 Vaccination 12/05/2019, 01/02/2020, 08/31/2020, 03/24/2021  . Pneumococcal Conjugate-13 05/04/2016  . Pneumococcal Polysaccharide-23 08/27/2007, 06/17/2013  . Tdap 09/16/2010  . Zoster Recombinat (Shingrix) 05/30/2018, 07/29/2018  . Zoster, Live 02/28/2011    TDAP status: Due, Education has been provided regarding the importance of this vaccine. Advised may receive this vaccine at local pharmacy or Health Dept. Aware to provide a copy  of the vaccination record if obtained from local pharmacy or Health Dept. Verbalized acceptance and understanding.  Flu Vaccine status: Up to date  Pneumococcal vaccine status: Up to date  Covid-19 vaccine status: Completed vaccines  Qualifies for Shingles Vaccine? Yes   Zostavax completed Yes   Shingrix Completed?: Yes  Screening Tests Health Maintenance  Topic Date Due  . TETANUS/TDAP  10/30/2021 (Originally 09/16/2020)  . INFLUENZA VACCINE  05/30/2021  . MAMMOGRAM  07/13/2021  . COLONOSCOPY (Pts 45-11yrs Insurance coverage will need to be confirmed)  10/30/2021  . DEXA SCAN  Completed  . COVID-19 Vaccine  Completed  . Hepatitis C Screening  Completed  . PNA vac Low Risk Adult  Completed  . Zoster Vaccines- Shingrix  Completed  . HPV VACCINES  Aged Out    Health Maintenance  There are no preventive care reminders to display for this patient.  Colorectal cancer screening: Type of screening: Colonoscopy. Completed 2013. Repeat every 10 years  Mammogram status: Completed 07/13/20. Repeat every year  Bone Density status: Completed 05/05/19. Results reflect: Bone density results: OSTEOPENIA. Repeat every 2 years. scheduled for 05/05/21.   Lung Cancer Screening: (Low Dose CT Chest recommended if Age 68-80 years, 30 pack-year currently smoking OR have quit w/in 15years.) does not qualify.   Additional Screening:  Hepatitis C Screening: does qualify; Completed 03/17/16  Vision Screening: Recommended annual ophthalmology exams for early detection of glaucoma and other disorders of the eye. Is the patient up to date with their annual eye exam?  Yes  Who is the provider or what is the name of the office in which the patient attends annual  eye exams? [facility].   Dental Screening: Recommended annual dental exams for proper oral hygiene  Community Resource Referral / Chronic Care Management: CRR required this visit?  No   CCM required this visit?  No      Plan:      I have personally reviewed and noted the following in the patient's chart:   . Medical and social history . Use of alcohol, tobacco or illicit drugs  . Current medications and supplements including opioid prescriptions.  . Functional ability and status . Nutritional status . Physical activity . Advanced directives . List of other physicians . Hospitalizations, surgeries, and ER visits in previous 12 months . Vitals . Screenings to include cognitive, depression, and falls . Referrals and appointments  In addition, I have reviewed and discussed with patient certain preventive protocols, quality metrics, and best practice recommendations. A written personalized care plan for preventive services as well as general preventive health recommendations were provided to patient.     Clemetine Marker, LPN   11/03/7827   Nurse Notes: pt seen at urgent care for rash between breast and prescribed nystatin cream. Pt states it is difficult for her to keep area dry this time of year and has previously used nystop powder and interested in rx for that instead of cream. Pt advised to discuss with provider.

## 2021-03-31 NOTE — Patient Instructions (Signed)
Mary Todd , Thank you for taking time to come for your Medicare Wellness Visit. I appreciate your ongoing commitment to your health goals. Please review the following plan we discussed and let me know if I can assist you in the future.   Screening recommendations/referrals: Colonoscopy: done 06/26/12. Repeat in 2023 Mammogram: done 07/13/20. Please call [phone] to schedule your mammogram. Bone Density: done 05/05/19. Scheduled for 05/05/21 Recommended yearly ophthalmology/optometry visit for glaucoma screening and checkup Recommended yearly dental visit for hygiene and checkup  Vaccinations: Influenza vaccine: done 07/06/20 Pneumococcal vaccine: done 05/04/16 Tdap vaccine: due Shingles vaccine: done 05/30/18 & 07/29/18   Covid-19:done 12/05/19, 01/02/20, 08/31/20 & 03/24/21  Advanced directives: Advance directive discussed with you today. I have provided a copy for you to complete at home and have notarized. Once this is complete please bring a copy in to our office so we can scan it into your chart.  Conditions/risks identified: Recommend drinking 6-8 glasses of water per day   Next appointment: Follow up in one year for your annual wellness visit    Preventive Care 65 Years and Older, Female Preventive care refers to lifestyle choices and visits with your health care provider that can promote health and wellness. What does preventive care include?  A yearly physical exam. This is also called an annual well check.  Dental exams once or twice a year.  Routine eye exams. Ask your health care provider how often you should have your eyes checked.  Personal lifestyle choices, including:  Daily care of your teeth and gums.  Regular physical activity.  Eating a healthy diet.  Avoiding tobacco and drug use.  Limiting alcohol use.  Practicing safe sex.  Taking low-dose aspirin every day.  Taking vitamin and mineral supplements as recommended by your health care provider. What happens  during an annual well check? The services and screenings done by your health care provider during your annual well check will depend on your age, overall health, lifestyle risk factors, and family history of disease. Counseling  Your health care provider may ask you questions about your:  Alcohol use.  Tobacco use.  Drug use.  Emotional well-being.  Home and relationship well-being.  Sexual activity.  Eating habits.  History of falls.  Memory and ability to understand (cognition).  Work and work Statistician.  Reproductive health. Screening  You may have the following tests or measurements:  Height, weight, and BMI.  Blood pressure.  Lipid and cholesterol levels. These may be checked every 5 years, or more frequently if you are over 21 years old.  Skin check.  Lung cancer screening. You may have this screening every year starting at age 61 if you have a 30-pack-year history of smoking and currently smoke or have quit within the past 15 years.  Fecal occult blood test (FOBT) of the stool. You may have this test every year starting at age 60.  Flexible sigmoidoscopy or colonoscopy. You may have a sigmoidoscopy every 5 years or a colonoscopy every 10 years starting at age 6.  Hepatitis C blood test.  Hepatitis B blood test.  Sexually transmitted disease (STD) testing.  Diabetes screening. This is done by checking your blood sugar (glucose) after you have not eaten for a while (fasting). You may have this done every 1-3 years.  Bone density scan. This is done to screen for osteoporosis. You may have this done starting at age 41.  Mammogram. This may be done every 1-2 years. Talk to your health care  provider about how often you should have regular mammograms. Talk with your health care provider about your test results, treatment options, and if necessary, the need for more tests. Vaccines  Your health care provider may recommend certain vaccines, such  as:  Influenza vaccine. This is recommended every year.  Tetanus, diphtheria, and acellular pertussis (Tdap, Td) vaccine. You may need a Td booster every 10 years.  Zoster vaccine. You may need this after age 45.  Pneumococcal 13-valent conjugate (PCV13) vaccine. One dose is recommended after age 64.  Pneumococcal polysaccharide (PPSV23) vaccine. One dose is recommended after age 24. Talk to your health care provider about which screenings and vaccines you need and how often you need them. This information is not intended to replace advice given to you by your health care provider. Make sure you discuss any questions you have with your health care provider. Document Released: 11/12/2015 Document Revised: 07/05/2016 Document Reviewed: 08/17/2015 Elsevier Interactive Patient Education  2017 [place] Prevention in the Home Falls can cause injuries. They can happen to people of all ages. There are many things you can do to make your home safe and to help prevent falls. What can I do on the outside of my home?  Regularly fix the edges of walkways and driveways and fix any cracks.  Remove anything that might make you trip as you walk through a door, such as a raised step or threshold.  Trim any bushes or trees on the path to your home.  Use bright outdoor lighting.  Clear any walking paths of anything that might make someone trip, such as rocks or tools.  Regularly check to see if handrails are loose or broken. Make sure that both sides of any steps have handrails.  Any raised decks and porches should have guardrails on the edges.  Have any leaves, snow, or ice cleared regularly.  Use sand or salt on walking paths during winter.  Clean up any spills in your garage right away. This includes oil or grease spills. What can I do in the bathroom?  Use night lights.  Install grab bars by the toilet and in the tub and shower. Do not use towel bars as grab bars.  Use  non-skid mats or decals in the tub or shower.  If you need to sit down in the shower, use a plastic, non-slip stool.  Keep the floor dry. Clean up any water that spills on the floor as soon as it happens.  Remove soap buildup in the tub or shower regularly.  Attach bath mats securely with double-sided non-slip rug tape.  Do not have throw rugs and other things on the floor that can make you trip. What can I do in the bedroom?  Use night lights.  Make sure that you have a light by your bed that is easy to reach.  Do not use any sheets or blankets that are too big for your bed. They should not hang down onto the floor.  Have a firm chair that has side arms. You can use this for support while you get dressed.  Do not have throw rugs and other things on the floor that can make you trip. What can I do in the kitchen?  Clean up any spills right away.  Avoid walking on wet floors.  Keep items that you use a lot in easy-to-reach places.  If you need to reach something above you, use a strong step stool that has a grab bar.  Keep electrical cords out of the way.  Do not use floor polish or wax that makes floors slippery. If you must use wax, use non-skid floor wax.  Do not have throw rugs and other things on the floor that can make you trip. What can I do with my stairs?  Do not leave any items on the stairs.  Make sure that there are handrails on both sides of the stairs and use them. Fix handrails that are broken or loose. Make sure that handrails are as long as the stairways.  Check any carpeting to make sure that it is firmly attached to the stairs. Fix any carpet that is loose or worn.  Avoid having throw rugs at the top or bottom of the stairs. If you do have throw rugs, attach them to the floor with carpet tape.  Make sure that you have a light switch at the top of the stairs and the bottom of the stairs. If you do not have them, ask someone to add them for you. What  else can I do to help prevent falls?  Wear shoes that:  Do not have high heels.  Have rubber bottoms.  Are comfortable and fit you well.  Are closed at the toe. Do not wear sandals.  If you use a stepladder:  Make sure that it is fully opened. Do not climb a closed stepladder.  Make sure that both sides of the stepladder are locked into place.  Ask someone to hold it for you, if possible.  Clearly mark and make sure that you can see:  Any grab bars or handrails.  First and last steps.  Where the edge of each step is.  Use tools that help you move around (mobility aids) if they are needed. These include:  Canes.  Walkers.  Scooters.  Crutches.  Turn on the lights when you go into a dark area. Replace any light bulbs as soon as they burn out.  Set up your furniture so you have a clear path. Avoid moving your furniture around.  If any of your floors are uneven, fix them.  If there are any pets around you, be aware of where they are.  Review your medicines with your doctor. Some medicines can make you feel dizzy. This can increase your chance of falling. Ask your doctor what other things that you can do to help prevent falls. This information is not intended to replace advice given to you by your health care provider. Make sure you discuss any questions you have with your health care provider. Document Released: 08/12/2009 Document Revised: 03/23/2016 Document Reviewed: 11/20/2014 Elsevier Interactive Patient Education  2017 [employer].

## 2021-04-04 ENCOUNTER — Other Ambulatory Visit: Payer: Self-pay

## 2021-04-04 ENCOUNTER — Ambulatory Visit (INDEPENDENT_AMBULATORY_CARE_PROVIDER_SITE_OTHER): Payer: Medicare Other | Admitting: Urology

## 2021-04-04 DIAGNOSIS — R32 Unspecified urinary incontinence: Secondary | ICD-10-CM | POA: Diagnosis not present

## 2021-04-04 NOTE — Progress Notes (Signed)
Patient ID: Mary Todd, female   DOB: [birth year], 75 y.o.   MRN: [id number] PTNS  Session # monthly  Health & Social Factors: no change Caffeine: 1 Alcohol: 1 Daytime voids #per day: 4 Night-time voids #per night: 3 Urgency: mild Incontinence Episodes #per day: 0 Ankle used: left Treatment Setting: 4 Feeling/ Response: both Comments: pt tolerated well  Performed By: Edwin Dada, CMA  Assistant:   Follow Up: 89mth

## 2021-04-05 ENCOUNTER — Telehealth: Payer: Self-pay | Admitting: *Deleted

## 2021-04-05 ENCOUNTER — Encounter: Payer: Self-pay | Admitting: Family Medicine

## 2021-04-05 NOTE — Telephone Encounter (Signed)
I returned call to patient and explained that NP wanted patient to be seen next week by physician and advised of appointment timeThursday 6/16 245 lab, [place] doctor Patient in agreement with this plan

## 2021-04-05 NOTE — Telephone Encounter (Signed)
She denied fevers and did not discuss weight.

## 2021-04-05 NOTE — Telephone Encounter (Signed)
Patient called reporting that Dr Mike Gip had her to wean off the Arimidex she had been taking for 10 years and she finally stopped it in <ay. She feels she is having problems from being off the medicine, she is having tiredness, hot flashes and night sweats (of note, this lo has Hodgkin's Lymphoma) She states that she feels worse being off Arimidex with joint pain as well and that she just does not fell well in general. Please advise.

## 2021-04-05 NOTE — Telephone Encounter (Signed)
The only availability Dr. Tasia Catchings has next week in [place] is at 3:00. I have went ahead and scheduled her for labs at 2:45 & 3:00. Do I need to make patient aware or will someone else be reaching out to her?

## 2021-04-05 NOTE — Telephone Encounter (Signed)
Any fevers/weight loss? I would be concerned that these could be B symptoms from her lymphoma. I would recommend scheduling follow up with oncology in [facility] for labs and MD evaluation.

## 2021-04-14 ENCOUNTER — Inpatient Hospital Stay (HOSPITAL_BASED_OUTPATIENT_CLINIC_OR_DEPARTMENT_OTHER): Payer: Medicare Other | Admitting: Oncology

## 2021-04-14 ENCOUNTER — Other Ambulatory Visit: Payer: Self-pay

## 2021-04-14 ENCOUNTER — Encounter: Payer: Self-pay | Admitting: Oncology

## 2021-04-14 ENCOUNTER — Inpatient Hospital Stay: Payer: Medicare Other | Attending: Oncology

## 2021-04-14 VITALS — BP 126/83 | HR 94 | Temp 97.4°F | Resp 16 | Wt 231.2 lb

## 2021-04-14 DIAGNOSIS — M810 Age-related osteoporosis without current pathological fracture: Secondary | ICD-10-CM

## 2021-04-14 DIAGNOSIS — Z8571 Personal history of Hodgkin lymphoma: Secondary | ICD-10-CM | POA: Insufficient documentation

## 2021-04-14 DIAGNOSIS — Z800 Family history of malignant neoplasm of digestive organs: Secondary | ICD-10-CM | POA: Insufficient documentation

## 2021-04-14 DIAGNOSIS — C50911 Malignant neoplasm of unspecified site of right female breast: Secondary | ICD-10-CM

## 2021-04-14 DIAGNOSIS — R61 Generalized hyperhidrosis: Secondary | ICD-10-CM | POA: Insufficient documentation

## 2021-04-14 DIAGNOSIS — Z90710 Acquired absence of both cervix and uterus: Secondary | ICD-10-CM | POA: Insufficient documentation

## 2021-04-14 DIAGNOSIS — C8193 Hodgkin lymphoma, unspecified, intra-abdominal lymph nodes: Secondary | ICD-10-CM

## 2021-04-14 DIAGNOSIS — E041 Nontoxic single thyroid nodule: Secondary | ICD-10-CM

## 2021-04-14 DIAGNOSIS — R5383 Other fatigue: Secondary | ICD-10-CM | POA: Insufficient documentation

## 2021-04-14 DIAGNOSIS — Z79899 Other long term (current) drug therapy: Secondary | ICD-10-CM | POA: Insufficient documentation

## 2021-04-14 DIAGNOSIS — Z853 Personal history of malignant neoplasm of breast: Secondary | ICD-10-CM | POA: Insufficient documentation

## 2021-04-14 DIAGNOSIS — Z803 Family history of malignant neoplasm of breast: Secondary | ICD-10-CM | POA: Diagnosis not present

## 2021-04-14 LAB — COMPREHENSIVE METABOLIC PANEL
ALT: 30 U/L (ref 0–44)
AST: 24 U/L (ref 15–41)
Albumin: 4 g/dL (ref 3.5–5.0)
Alkaline Phosphatase: 69 U/L (ref 38–126)
Anion gap: 6 (ref 5–15)
BUN: 19 mg/dL (ref 8–23)
CO2: 26 mmol/L (ref 22–32)
Calcium: 9.2 mg/dL (ref 8.9–10.3)
Chloride: 104 mmol/L (ref 98–111)
Creatinine, Ser: 0.61 mg/dL (ref 0.44–1.00)
GFR, Estimated: >60 mL/min (ref >60)
Glucose, Bld: 123 mg/dL — ABNORMAL HIGH (ref 70–99)
Potassium: 4.7 mmol/L (ref 3.5–5.1)
Sodium: 136 mmol/L (ref 135–145)
Total Bilirubin: 0.6 mg/dL (ref 0.3–1.2)
Total Protein: 7.5 g/dL (ref 6.5–8.1)

## 2021-04-14 LAB — CBC WITH DIFFERENTIAL/PLATELET
Abs Immature Granulocytes: 0.02 10*3/uL (ref 0.00–0.07)
Basophils Absolute: 0.1 10*3/uL (ref 0.0–0.1)
Basophils Relative: 1 %
Eosinophils Absolute: 0.5 10*3/uL (ref 0.0–0.5)
Eosinophils Relative: 6 %
HCT: 36.2 % (ref 36.0–46.0)
Hemoglobin: 12.5 g/dL (ref 12.0–15.0)
Immature Granulocytes: 0 %
Lymphocytes Relative: 39 %
Lymphs Abs: 3.1 10*3/uL (ref 0.7–4.0)
MCH: 30.7 pg (ref 26.0–34.0)
MCHC: 34.5 g/dL (ref 30.0–36.0)
MCV: 88.9 fL (ref 80.0–100.0)
Monocytes Absolute: 0.6 10*3/uL (ref 0.1–1.0)
Monocytes Relative: 7 %
Neutro Abs: 3.6 10*3/uL (ref 1.7–7.7)
Neutrophils Relative %: 47 %
Platelets: 226 10*3/uL (ref 150–400)
RBC: 4.07 MIL/uL (ref 3.87–5.11)
RDW: 14 % (ref 11.5–15.5)
WBC: 7.8 10*3/uL (ref 4.0–10.5)
nRBC: 0 % (ref 0.0–0.2)

## 2021-04-14 LAB — TSH: TSH: 0.604 u[IU]/mL (ref 0.350–4.500)

## 2021-04-14 LAB — LACTATE DEHYDROGENASE: LDH: 136 U/L (ref 98–192)

## 2021-04-14 LAB — T4, FREE: Free T4: 0.86 ng/dL (ref 0.61–1.12)

## 2021-04-16 ENCOUNTER — Encounter: Payer: Self-pay | Admitting: Hematology and Oncology

## 2021-04-16 NOTE — Progress Notes (Signed)
[facility]  [address] [place], [place] [address] Phone: [phone]  Fax: [phone]   Clinic Day:  04/16/2021  Referring physician: Steele Sizer, MD  Chief Complaint: Mary Todd is a 75 y.o. female with stage II Hodgkin's disease (2004) and stage IA right breast cancer (2011)  Mary Todd is a 75 y.o.afemale who has above oncology history reviewed by me today presented for follow up visit for management of stage II Hodgkin's disease (2004) and stage IA right breast cancer (2011)   Patient previously followed up by Dr.Corcoran, patient switched care to me on 04/16/21 Extensive medical record review was performed by me  stage II Hodgkin's disease (2004) and stage IA right breast cancer (2011).   2004 stage IIA Hodgkin's disease after presenting with low counts and joint pain.  Abdomen and pelvic CT scan as well as PET scan revealed a pelvic mass and retroperitoneal lymphadenopathy.  Biopsy confirmed Hodgkin's disease.     She received ABVD times 5 through cycle 3-A and then AVD without the Bleomycin times 7 subsequent cycles.  Treatment completed in 12/2003.     CT and PET scans were negative in 05/2006 and in 06/2006.   07/21/2010. She underwent right breast lumpectomy and sentinel lymph node biopsy Pathology revealed a 0.5 cm grade II invasive ductal carcinoma.  Two sentinel lymph nodes were negative.  Tumor was ER + (>90%), PR + (40%), and Her2/neu 1+.  Pathologic stage was T1aN0Mx.  She received Mammosite radiation (completed 08/22/2010).   Oncotype DX testing revealed a low risk lesion.  BRCA1/2 testing was negative.  She received 5 years of an aromatase inhibitor.   Breast cancer index (BCI) testing on 07/23/2015 revealed a 6.3% (CI: 2.9%-9.6%) risk of late recurrent disease (years 5-10) after 5 years of hormonal therapy and a high likelihood of benefit from continued hormonal therapy  (30% reduction).  Decision was made to continue Arimidex for 5 more years.   Bilateral screening mammogram on 07/13/2020 revealed no evidence of malignancy.    Thyroid ultrasound  on 04/13/2007 revealed a 2.3 cm solid and cystic nodule within the left thyroid gland. There was also a small nodule within the right thyroid gland that did not meet size criteria for measurement. There was a single benign appearing lymph node within the right neck.  FNA in 06/2016 was  "indeterminate" and in 08/2016 was "benign".     Bone density study on 09/05/2011 revealed osteopenia with a  T-score of -1.7 in L1-L4.  Bone density study on 11/14/2012 revealed osteopenia with a  T-score of -1.5 in L1-L4.  Bone density study on 12/14/2016 revealed osteoporosis in L2-3 with a T-score of -3.1.  Left femur normal with T-score of -0.8.  Bone density on 05/05/2019 revealed osteopenia with a T-score of -2.4 in the AP spine L2-L3.  She began Prolia on 01/24/2017, every 6 months   INTERVAL HISTORY Mary Todd is a 75 y.o. female who has above history reviewed by me today presents for evaluation of night sweats, fatigue and tiredness, hot flashes.  She has finished 10 years of Arimidex and is stopped in March 2022. Starting April 2022, she started to experience more fatigue and tiredness, hot flashes, night sweats. Appetite is fair.  No weight loss.  She has gained weight    Past Medical History:  Diagnosis Date   Allergy    Arthritis    knees, lower back   Breast cancer ([place]) right  2011, T1A N0, radiation   Esophageal reflux    Hodgkin's disease ([place]) 2004   Hypertension 2006   Osteoporosis    Personal history of malignant neoplasm of breast 2011   right breast   Personal history of radiation therapy 2011   RIGHT lumpectomy w/ radiation    Past Surgical History:  Procedure Laterality Date   ABDOMINAL HYSTERECTOMY     BACK SURGERY     BREAST BIOPSY Right 2011   radiation   BREAST LUMPECTOMY  Right 2011   w/ radiation   BREAST SURGERY Right 2011   right breast lumpectomy,L/SN/R for right breast CA    CATARACT EXTRACTION W/PHACO Left 06/10/2018   Procedure: CATARACT EXTRACTION PHACO AND INTRAOCULAR LENS PLACEMENT ([place]) LEFT;  Surgeon: Eulogio Bear, MD;  Location: [facility];  Service: Ophthalmology;  Laterality: Left;  Diabetic - oral meds   CATARACT EXTRACTION W/PHACO Right 07/15/2018   Procedure: CATARACT EXTRACTION PHACO AND INTRAOCULAR LENS PLACEMENT ([place]) RIGHT;  Surgeon: Eulogio Bear, MD;  Location: [place];  Service: Ophthalmology;  Laterality: Right;   COLONOSCOPY  2003   PORT A CATH REVISION     REPLACEMENT TOTAL KNEE Right 10/03/2019   DR. [place]  2005    Family History  Problem Relation Age of Onset   Cancer Father    Bone cancer Father    Breast cancer Mother 58   Stroke Mother    Colon cancer Maternal Grandmother    Breast cancer Cousin     Social History:  reports that she has never smoked. She has never used smokeless tobacco. She reports current alcohol use of about 2.0 standard drinks of alcohol per week. She reports that she does not use drugs.The patient is alone today.  Allergies:  Allergies  Allergen Reactions   Shrimp [Shellfish Allergy] Hives   Augmentin [Amoxicillin-Pot Clavulanate] Nausea Only    Current Medications: Current Outpatient Medications  Medication Sig Dispense Refill   acetaminophen (TYLENOL) 650 MG CR tablet Take 1,300 mg by mouth every 8 (eight) hours as needed for pain.     amLODipine-valsartan (EXFORGE) 5-160 MG tablet Take 1 tablet by mouth daily. 90 tablet 1   aspirin 81 MG tablet Take 81 mg by mouth daily.     CALCIUM-MAGNESIUM PO Take 1 tablet by mouth daily.     cetirizine (ZYRTEC) 10 MG tablet Take 10 mg by mouth daily.     Cholecalciferol (VITAMIN D-3 PO) Take 1 tablet by mouth daily.     denosumab (PROLIA) 60 MG/ML SOSY injection Inject 60 mg into the skin every 6  (six) months.     gabapentin (NEURONTIN) 100 MG capsule Take 1 capsule (100 mg total) by mouth at bedtime. 90 capsule 2   ipratropium (ATROVENT) 0.06 % nasal spray Place 2 sprays into both nostrils 4 (four) times daily. 15 mL 12   meloxicam (MOBIC) 15 MG tablet Take 1 tablet (15 mg total) by mouth daily. 90 tablet 1   metoprolol succinate (TOPROL-XL) 50 MG 24 hr tablet Take 1 tablet (50 mg total) by mouth daily. Take with or immediately following a meal. 90 tablet 1   Multiple Vitamin (MULTIVITAMIN) tablet Take 1 tablet by mouth daily.     nystatin (NYSTATIN) powder Apply 1 application topically 3 (three) times daily. 60 g 0   pantoprazole (PROTONIX) 40 MG tablet Take 1 tablet (40 mg total) by mouth daily. 90 tablet 1   tolterodine (DETROL LA) 4 MG  24 hr capsule Take 1 capsule (4 mg total) by mouth daily. 90 capsule 3   diclofenac Sodium (VOLTAREN) 1 % GEL Apply 2 g topically daily as needed. (Patient not taking: No sig reported)     promethazine-dextromethorphan (PROMETHAZINE-DM) 6.25-15 MG/5ML syrup Take 5 mLs by mouth 4 (four) times daily as needed. (Patient not taking: Reported on 04/14/2021) 118 mL 0   No current facility-administered medications for this visit.    Review of Systems  Constitutional:  Positive for malaise/fatigue. Negative for chills, diaphoresis, fever and weight loss (up 13 lbs).       Night sweats  HENT: Negative.  Negative for congestion, ear discharge, ear pain, hearing loss, nosebleeds, sinus pain, sore throat and tinnitus.        No dental issues.  Eyes: Negative.  Negative for blurred vision and double vision.  Respiratory: Negative.  Negative for cough, hemoptysis, sputum production and shortness of breath.   Cardiovascular: Negative.  Negative for chest pain, palpitations and leg swelling.  Gastrointestinal: Negative.  Negative for abdominal pain, blood in stool, constipation, diarrhea, heartburn, melena, nausea and vomiting.  Genitourinary: Negative.  Negative  for dysuria, frequency, hematuria and urgency.  Musculoskeletal: Negative.  Negative for back pain, joint pain, myalgias and neck pain.  Skin:  Negative for itching and rash.       Sweating underneath breasts, uses Nystatin powder  Neurological: Negative.  Negative for dizziness, tingling, sensory change, speech change, focal weakness, weakness and headaches.  Endo/Heme/Allergies: Negative.  Does not bruise/bleed easily.  Psychiatric/Behavioral: Negative.  Negative for depression and memory loss. The patient is not nervous/anxious and does not have insomnia.   All other systems reviewed and are negative. Performance status (ECOG): 0  Vitals Blood pressure 126/83, pulse 94, temperature (!) 97.4 F (36.3 C), resp. rate 16, weight 231 lb 2.4 oz (104.9 kg), SpO2 96 %.   Physical Exam Vitals and nursing note reviewed.  Constitutional:      General: She is not in acute distress.    Appearance: She is well-developed. She is not diaphoretic.  HENT:     Head: Normocephalic and atraumatic.     Mouth/Throat:     Mouth: Mucous membranes are moist.     Pharynx: Oropharynx is clear. No oropharyngeal exudate.  Eyes:     General: No scleral icterus.    Pupils: Pupils are equal, round, and reactive to light.  Cardiovascular:     Rate and Rhythm: Normal rate and regular rhythm.     Heart sounds: Normal heart sounds. No murmur heard. Pulmonary:     Effort: Pulmonary effort is normal. No respiratory distress.     Breath sounds: Normal breath sounds. No wheezing or rales.  Chest:     Chest wall: No tenderness.  Breasts:    Right: Skin change (prominent 3 cm area of scarring 12 cm from the nipple at the 2 o'clock position, stable. Scattered fibrocystic changes.) present. No inverted nipple, mass, nipple discharge, tenderness, axillary adenopathy or supraclavicular adenopathy.     Left: Skin change (fibrocystic changes upper outer quadrant) present. No inverted nipple, mass, nipple discharge,  tenderness, axillary adenopathy or supraclavicular adenopathy.  Abdominal:     General: Bowel sounds are normal. There is no distension.     Palpations: Abdomen is soft. There is no mass.     Tenderness: There is no abdominal tenderness. There is no guarding or rebound.  Musculoskeletal:        General: No swelling or tenderness. Normal range  of motion.     Cervical back: Normal range of motion and neck supple.  Lymphadenopathy:     Head:     Right side of head: No preauricular, posterior auricular or occipital adenopathy.     Left side of head: No preauricular, posterior auricular or occipital adenopathy.     Cervical: No cervical adenopathy.     Upper Body:     Right upper body: No supraclavicular or axillary adenopathy.     Left upper body: No supraclavicular or axillary adenopathy.     Lower Body: No right inguinal adenopathy. No left inguinal adenopathy.  Skin:    General: Skin is warm and dry.  Neurological:     Mental Status: She is alert and oriented to person, place, and time.  Psychiatric:        Behavior: Behavior normal.        Thought Content: Thought content normal.        Judgment: Judgment normal.   Laboratory findings CBC    Component Value Date/Time   WBC 7.8 04/14/2021 1430   RBC 4.07 04/14/2021 1430   HGB 12.5 04/14/2021 1430   HGB 13.4 03/16/2016 1458   HCT 36.2 04/14/2021 1430   HCT 39.6 03/16/2016 1458   PLT 226 04/14/2021 1430   PLT 226 03/16/2016 1458   MCV 88.9 04/14/2021 1430   MCV 88 03/16/2016 1458   MCV 91 11/03/2014 1015   MCH 30.7 04/14/2021 1430   MCHC 34.5 04/14/2021 1430   RDW 14.0 04/14/2021 1430   RDW 15.3 03/16/2016 1458   RDW 14.1 11/03/2014 1015   LYMPHSABS 3.1 04/14/2021 1430   LYMPHSABS 2.5 03/16/2016 1458   LYMPHSABS 2.0 11/03/2014 1015   MONOABS 0.6 04/14/2021 1430   MONOABS 0.7 11/03/2014 1015   EOSABS 0.5 04/14/2021 1430   EOSABS 0.3 03/16/2016 1458   EOSABS 0.2 11/03/2014 1015   BASOSABS 0.1 04/14/2021 1430    BASOSABS 0.0 03/16/2016 1458   BASOSABS 0.1 11/03/2014 1015   CMP Latest Ref Rng & Units 04/14/2021 12/27/2020 06/24/2020  Glucose 70 - 99 mg/dL 123(H) 91 101(H)  BUN 8 - 23 mg/dL _0 Creatinine 0.44 - 1.00 mg/dL 0.61 0.66 0.75  Sodium 135 - 145 mmol/L 136 134(L) 135  Potassium 3.5 - 5.1 mmol/L 4.7 4.4 4.0  Chloride 98 - 111 mmol/L 104 99 100  CO2 22 - 32 mmol/L _1 Calcium 8.9 - 10.3 mg/dL 9.2 9.2 8.9  Total Protein 6.5 - 8.1 g/dL 7.5 7.2 -  Total Bilirubin 0.3 - 1.2 mg/dL 0.6 0.8 -  Alkaline Phos 38 - 126 U/L 69 70 -  AST 15 - 41 U/L 24 21 -  ALT 0 - 44 U/L 30 30 -     Assessment and plan 1. Osteoporosis without current pathological fracture, unspecified osteoporosis type   2. Malignant neoplasm of right female breast, unspecified estrogen receptor status, unspecified site of breast ([place])   3. Hodgkin lymphoma of intra-abdominal lymph nodes, unspecified Hodgkin lymphoma type ([place])   4. Thyroid nodule   5. Night sweat     #History of stage II Hodgkin's lymphoma Patient has been in remission for many years. night sweats and profound fatigue-constitutional symptoms versus change of hormone levels secondary to discontinuation of Arimidex Normal LDH.  Complete normal CBC. Check TSH and free T4-normal Check chest abdomen pelvis CT for further evaluation.  #Stage IA right breast cancer Clinically she is doing well except above symptoms. Finished 10 years  of antiestrogen treatments. Continue bilateral screening mammogram-September 2022  #Osteopenia Patient needs repeat bone density in July 2022. Continue calcium and vitamin D supplementation Has been on Prolia every 6 months-next due August 2022  #Thyroid nodule, followed by endocrinology Dr. Gabriel Carina.  RTC in August 2022 for labs (BMP) and Prolia. RTC keep current appointment with me in March 2023 (CBC with diff, CMP, LDH, CA27.29), review of mammogram, and Prolia. She may see me sooner if CT shows abnormality.   Patient agrees with the plan.   I discussed the assessment and treatment plan with the patient.  The patient was provided an opportunity to ask questions and all were answered.  The patient agreed with the plan and demonstrated an understanding of the instructions.  The patient was advised to call back if the symptoms worsen or if the condition fails to improve as anticipated.  We spent sufficient time to discuss many aspect of care, questions were answered to patient's satisfaction. A total of 40 minutes was spent on this visit.  With 5 minutes spent reviewing image findings, pathology reports, 30 minutes counseling the patient on the diagnosis, goal of care, chemotherapy treatments, side effects of the treatment, management of symptoms.  Additional 5 minutes was spent on answering patient's questions.  Earlie Server, MD, PhD Hematology Oncology [place] at [facility] 04/16/2021

## 2021-04-18 ENCOUNTER — Telehealth: Payer: Self-pay

## 2021-04-18 ENCOUNTER — Other Ambulatory Visit: Payer: Self-pay

## 2021-04-18 DIAGNOSIS — Z1231 Encounter for screening mammogram for malignant neoplasm of breast: Secondary | ICD-10-CM

## 2021-04-18 DIAGNOSIS — C50911 Malignant neoplasm of unspecified site of right female breast: Secondary | ICD-10-CM

## 2021-04-18 NOTE — Telephone Encounter (Signed)
Please schedule mammo as MD recommends and notify pt of appt. New order entered.

## 2021-04-18 NOTE — Telephone Encounter (Signed)
-----   Message from Earlie Server, MD sent at 04/16/2021  1:23 PM EDT ----- Please schedule patient to have bilateral screening mammogram in September 2022. There is an order from Dr. Mike Gip please change it to my name.  Thank you

## 2021-04-19 ENCOUNTER — Encounter: Payer: Self-pay | Admitting: Hematology and Oncology

## 2021-04-20 ENCOUNTER — Other Ambulatory Visit: Payer: Self-pay

## 2021-04-20 ENCOUNTER — Ambulatory Visit
Admission: RE | Admit: 2021-04-20 | Discharge: 2021-04-20 | Disposition: A | Discharge status: Home / Self Care | Payer: Medicare Other | Source: Ambulatory Visit | Attending: Oncology | Admitting: Oncology

## 2021-04-20 DIAGNOSIS — Z853 Personal history of malignant neoplasm of breast: Secondary | ICD-10-CM | POA: Diagnosis not present

## 2021-04-20 DIAGNOSIS — Z8571 Personal history of Hodgkin lymphoma: Secondary | ICD-10-CM | POA: Diagnosis not present

## 2021-04-20 DIAGNOSIS — C50911 Malignant neoplasm of unspecified site of right female breast: Secondary | ICD-10-CM | POA: Diagnosis not present

## 2021-04-20 DIAGNOSIS — C8193 Hodgkin lymphoma, unspecified, intra-abdominal lymph nodes: Secondary | ICD-10-CM | POA: Insufficient documentation

## 2021-04-20 DIAGNOSIS — R61 Generalized hyperhidrosis: Secondary | ICD-10-CM | POA: Diagnosis not present

## 2021-04-20 DIAGNOSIS — R5383 Other fatigue: Secondary | ICD-10-CM | POA: Diagnosis not present

## 2021-04-20 MED ORDER — IOHEXOL 300 MG/ML  SOLN
100.0000 mL | Freq: Once | INTRAMUSCULAR | Status: AC | PRN
  Administered 2021-04-20: 100 mL via INTRAVENOUS

## 2021-04-25 ENCOUNTER — Telehealth: Payer: Self-pay

## 2021-04-25 DIAGNOSIS — K760 Fatty (change of) liver, not elsewhere classified: Secondary | ICD-10-CM

## 2021-04-25 NOTE — Telephone Encounter (Signed)
Per Dr. Tasia Catchings, ok for pt to continue having PCP monitor thyroid nodule. Pt notified.

## 2021-04-25 NOTE — Telephone Encounter (Signed)
-----   Message from Vanice Sarah, [place] sent at 04/25/2021  8:37 AM EDT -----  ----- Message ----- From: Earlie Server, MD Sent: 04/23/2021   3:38 PM EDT To: Evelina Dun, RN, Vanice Sarah, CMA  Please let patient know that  CT showed no signs of lymphoma recurrence.  Incidental findings of thyroid nodule, recommend thyroid US- please arrange.  Also liver is enlarged, fatty liver disease, please refer to GI Dr.Tahiliani for evaluation.  Keep same follow up appt

## 2021-04-25 NOTE — Telephone Encounter (Signed)
Patient states she had a Thyroid US done at [facility] clinic. Based on care every where she had a thyroid US back in July 2021. Patient would like for you to review those results first. If there is another one needed she is okay to proceed.

## 2021-04-26 ENCOUNTER — Ambulatory Visit: Payer: Medicare Other | Admitting: Family Medicine

## 2021-05-05 ENCOUNTER — Ambulatory Visit
Admission: RE | Admit: 2021-05-05 | Discharge: 2021-05-05 | Disposition: A | Discharge status: Home / Self Care | Payer: Medicare Other | Source: Ambulatory Visit | Attending: Hematology and Oncology | Admitting: Hematology and Oncology

## 2021-05-05 ENCOUNTER — Other Ambulatory Visit: Payer: Self-pay

## 2021-05-05 DIAGNOSIS — M8588 Other specified disorders of bone density and structure, other site: Secondary | ICD-10-CM | POA: Diagnosis present

## 2021-05-05 DIAGNOSIS — Z780 Asymptomatic menopausal state: Secondary | ICD-10-CM | POA: Diagnosis not present

## 2021-05-08 DIAGNOSIS — Z20822 Contact with and (suspected) exposure to covid-19: Secondary | ICD-10-CM | POA: Diagnosis not present

## 2021-05-09 ENCOUNTER — Ambulatory Visit (INDEPENDENT_AMBULATORY_CARE_PROVIDER_SITE_OTHER): Payer: Medicare Other | Admitting: Urology

## 2021-05-09 ENCOUNTER — Other Ambulatory Visit: Payer: Self-pay

## 2021-05-09 DIAGNOSIS — N3946 Mixed incontinence: Secondary | ICD-10-CM

## 2021-05-09 DIAGNOSIS — R32 Unspecified urinary incontinence: Secondary | ICD-10-CM

## 2021-05-09 NOTE — Progress Notes (Signed)
Patient ID: Mary Todd, female   DOB: 1945/12/20, 75 y.o.   MRN: [id number] PTNS  Session # monthly #23 of [place] Factors:no change Caffeine: 1 Alcohol: 1 Daytime voids #per day: 4 Night-time voids #per night: 2-3 Urgency: mild Incontinence Episodes #per day: 0 Ankle used: left Treatment Setting: 2 Feeling/ Response: both Comments: pt tolerated well  Performed By: Edwin Dada, CMA  Follow Up: monthly #24 of 45

## 2021-05-11 ENCOUNTER — Other Ambulatory Visit: Payer: Self-pay | Admitting: Family Medicine

## 2021-05-11 DIAGNOSIS — I10 Essential (primary) hypertension: Secondary | ICD-10-CM

## 2021-06-15 ENCOUNTER — Encounter: Payer: Self-pay | Admitting: Gastroenterology

## 2021-06-15 ENCOUNTER — Ambulatory Visit (INDEPENDENT_AMBULATORY_CARE_PROVIDER_SITE_OTHER): Payer: Medicare Other | Admitting: Gastroenterology

## 2021-06-15 VITALS — BP 145/88 | HR 96 | Temp 97.8°F | Ht 63.0 in | Wt 231.8 lb

## 2021-06-15 DIAGNOSIS — K760 Fatty (change of) liver, not elsewhere classified: Secondary | ICD-10-CM | POA: Diagnosis not present

## 2021-06-15 DIAGNOSIS — R140 Abdominal distension (gaseous): Secondary | ICD-10-CM

## 2021-06-15 NOTE — Progress Notes (Signed)
Gastroenterology Consultation  Referring Provider:     Earlie Server, MD Primary Care Physician:  Steele Sizer, MD Primary Gastroenterologist:  Dr. Allen Norris     Reason for Consultation:     Fatty liver        HPI:   Mary Todd is a 75 y.o. y/o female referred for consultation & management of fatty liver by Dr. Ancil Boozer, Drue Stager, MD. this patient comes in today after being referred by Dr. Tasia Catchings for fatty liver.  The patient had a CT scan done because of her Hodgkin's lymphoma which showed:  IMPRESSION: 1. No pathologically enlarged abdominal or pelvic lymph nodes and no evidence of metastatic disease within the chest, abdomen, or pelvis. 2. Left-sided thyroid nodules with the dominant nodule measuring 1.6 cm. Recommend thyroid US (ref: J Am Coll Radiol. 2015 Feb;12(2): 143-50). 3. Mild hepatomegaly with diffuse hepatic steatosis. 4. Left-sided colonic diverticulosis without findings of acute diverticulitis. 5.  Aortic Atherosclerosis (ICD10-I70.0).  The patient also has had blood work over the last 2 years that has shown no increase in her liver enzymes.  The trends of these liver enzymes are:  Component     Latest Ref Rng & Units 08/11/2019 12/26/2019 06/24/2020 12/27/2020  AST     15 - 41 U/L '20 22  21  '$ ALT     0 - 44 U/L '24 23  30  '$ Alkaline Phosphatase     38 - 126 U/L  88  70  Total Bilirubin     0.3 - 1.2 mg/dL 0.6 0.8  0.8   Component     Latest Ref Rng & Units 04/14/2021  AST     15 - 41 U/L 24  ALT     0 - 44 U/L 30  Alkaline Phosphatase     38 - 126 U/L 69  Total Bilirubin     0.3 - 1.2 mg/dL 0.6   The patient also reports that she has had increased bloating for the last 6 months.  There is been no change in bowel habits.  The patient does drink dairy products on a regular basis.  She also reports that she drinks approximately 3 glasses of wine 3 times a week.  There is no report of any unexplained weight loss black stools bloody stools nausea vomiting fevers  or chills.   Past Medical History:  Diagnosis Date   Allergy    Arthritis    knees, lower back   Esophageal reflux    Hypertension 2006   Osteoporosis    Personal history of malignant neoplasm of breast 2011   right breast   Personal history of radiation therapy 2011   RIGHT lumpectomy w/ radiation    Past Surgical History:  Procedure Laterality Date   ABDOMINAL HYSTERECTOMY     BACK SURGERY     BREAST BIOPSY Right 2011   radiation   BREAST LUMPECTOMY Right 2011   w/ radiation   BREAST SURGERY Right 2011   right breast lumpectomy,L/SN/R for right breast CA    CATARACT EXTRACTION W/PHACO Left 06/10/2018   Procedure: CATARACT EXTRACTION PHACO AND INTRAOCULAR LENS PLACEMENT (IOC) LEFT;  Surgeon: Eulogio Bear, MD;  Location: [facility];  Service: Ophthalmology;  Laterality: Left;  Diabetic - oral meds   CATARACT EXTRACTION W/PHACO Right 07/15/2018   Procedure: CATARACT EXTRACTION PHACO AND INTRAOCULAR LENS PLACEMENT ([place]) RIGHT;  Surgeon: Eulogio Bear, MD;  Location: [facility];  Service: Ophthalmology;  Laterality: Right;   COLONOSCOPY  2003   PORT A CATH REVISION     REPLACEMENT TOTAL KNEE Right 10/03/2019   DR. [facility]    WRIST SURGERY  2005    Prior to Admission medications   Medication Sig Start Date End Date Taking? Authorizing Provider  acetaminophen (TYLENOL) 650 MG CR tablet Take 1,300 mg by mouth every 8 (eight) hours as needed for pain.    [provider]  amLODipine-valsartan (EXFORGE) 5-160 MG tablet Take 1 tablet by mouth daily. 01/04/21   Steele Sizer, MD  aspirin 81 MG tablet Take 81 mg by mouth daily.    [provider]  CALCIUM-MAGNESIUM PO Take 1 tablet by mouth daily.    [provider]  cetirizine (ZYRTEC) 10 MG tablet Take 10 mg by mouth daily.    [provider]  Cholecalciferol (VITAMIN D-3 PO) Take 1 tablet by mouth daily.    [provider]  denosumab (PROLIA) 60 MG/ML SOSY  injection Inject 60 mg into the skin every 6 (six) months.    Lequita Asal, MD  diclofenac Sodium (VOLTAREN) 1 % GEL Apply 2 g topically daily as needed. Patient not taking: No sig reported    [provider]  gabapentin (NEURONTIN) 100 MG capsule Take 1 capsule (100 mg total) by mouth at bedtime. 08/24/20   Edrick Kins, DPM  ipratropium (ATROVENT) 0.06 % nasal spray Place 2 sprays into both nostrils 4 (four) times daily. 03/19/21   Margarette Canada, NP  meloxicam (MOBIC) 15 MG tablet Take 1 tablet (15 mg total) by mouth daily. 01/04/21   Steele Sizer, MD  metoprolol succinate (TOPROL-XL) 50 MG 24 hr tablet TAKE 1 TABLET (50 MG TOTAL) BY MOUTH DAILY. TAKE WITH OR IMMEDIATELY FOLLOWING A MEAL. 05/11/21   Steele Sizer, MD  Multiple Vitamin (MULTIVITAMIN) tablet Take 1 tablet by mouth daily.    [provider]  nystatin (NYSTATIN) powder Apply 1 application topically 3 (three) times daily. 03/31/21   Steele Sizer, MD  pantoprazole (PROTONIX) 40 MG tablet Take 1 tablet (40 mg total) by mouth daily. 01/04/21   Steele Sizer, MD  promethazine-dextromethorphan (PROMETHAZINE-DM) 6.25-15 MG/5ML syrup Take 5 mLs by mouth 4 (four) times daily as needed. Patient not taking: Reported on 04/14/2021 03/19/21   Margarette Canada, NP  tolterodine (DETROL LA) 4 MG 24 hr capsule Take 1 capsule (4 mg total) by mouth daily. 01/31/21   Bjorn Loser, MD    Family History  Problem Relation Age of Onset   Cancer Father    Bone cancer Father    Breast cancer Mother 54   Stroke Mother    Colon cancer Maternal Grandmother    Breast cancer Cousin      Social History   Tobacco Use   Smoking status: Never   Smokeless tobacco: Never   Tobacco comments:    smoking cessation materials not required  Vaping Use   Vaping Use: Never used  Substance Use Topics   Alcohol use: Yes    Alcohol/week: 2.0 standard drinks    Types: 2 Glasses of wine per week    Comment: occassionally   Drug use: No     Allergies as of 06/15/2021 - Review Complete 04/20/2021  Allergen Reaction Noted   Shrimp [shellfish allergy] Hives 05/30/2018   Augmentin [amoxicillin-pot clavulanate] Nausea Only 08/04/2015    Review of Systems:    All systems reviewed and negative except where noted in HPI.   Physical Exam:  There were no vitals taken for this visit.  No LMP recorded. Patient has had a hysterectomy. General:   Alert,  Well-developed, well-nourished, pleasant and cooperative in NAD Head:  Normocephalic and atraumatic. Eyes:  Sclera clear, no icterus.   Conjunctiva pink. Ears:  Normal auditory acuity. Neck:  Supple; no masses or thyromegaly. Lungs:  Respirations even and unlabored.  Clear throughout to auscultation.   No wheezes, crackles, or rhonchi. No acute distress. Heart:  Regular rate and rhythm; no murmurs, clicks, rubs, or gallops. Abdomen:  Normal bowel sounds.  No bruits.  Soft, non-tender and non-distended without masses, hepatosplenomegaly or hernias noted.  No guarding or rebound tenderness.  Negative Carnett sign.   Rectal:  Deferred.  Pulses:  Normal pulses noted. Extremities:  No clubbing or edema.  No cyanosis. Neurologic:  Alert and oriented x3;  grossly normal neurologically. Skin:  Intact without significant lesions or rashes.  No jaundice. Lymph Nodes:  No significant cervical adenopathy. Psych:  Alert and cooperative. Normal mood and affect.  Imaging Studies: No results found.  Assessment and Plan:   Mary Todd is a 75 y.o. y/o female who comes in today with a history of fatty liver without any abnormal liver enzymes.  The patient has been told to try and decrease her weight.  The patient has been given information on a Mediterranean diet.  The patient has also been told the risks of drinking alcohol while having fatty liver and has been encouraged to cut back on her alcohol.  The patient was told that her gas and bloating is likely from something she is eating  and she should try and avoid dairy products for 1 week to see if that helps decrease her gas and bloating.  The patient is not due for a colonoscopy for another year.  The patient has been encouraged to contact in 1 years time to set up the colonoscopy.  The patient has been explained the plan agrees with it.    Lucilla Lame, MD. Marval Regal    Note: This dictation was prepared with Dragon dictation along with smaller phrase technology. Any transcriptional errors that result from this process are unintentional.

## 2021-06-20 NOTE — Progress Notes (Signed)
Patient ID: Mary Todd, female   DOB: 19-Jan-1946, 75 y.o.   MRN: [id number] PTNS  Session # monthly #24 of [place] Factors:no change Caffeine: 1 Alcohol: 0-1 Daytime voids #per day: 3-4 Night-time voids #per night: 1-2 Urgency: mild Incontinence Episodes #per day: 0 Ankle used: left Treatment Setting: 1 Feeling/ Response: both Comments: pt tolerated well  Performed By: Zara Council, PA-C  Follow Up: monthly #25 of 45

## 2021-06-21 ENCOUNTER — Other Ambulatory Visit: Payer: Self-pay

## 2021-06-21 ENCOUNTER — Ambulatory Visit (INDEPENDENT_AMBULATORY_CARE_PROVIDER_SITE_OTHER): Payer: Medicare Other | Admitting: Urology

## 2021-06-21 DIAGNOSIS — R32 Unspecified urinary incontinence: Secondary | ICD-10-CM | POA: Diagnosis not present

## 2021-06-27 ENCOUNTER — Other Ambulatory Visit: Payer: Self-pay

## 2021-06-27 ENCOUNTER — Other Ambulatory Visit: Payer: Medicare Other

## 2021-06-27 ENCOUNTER — Ambulatory Visit: Payer: Medicare Other

## 2021-06-27 DIAGNOSIS — C50911 Malignant neoplasm of unspecified site of right female breast: Secondary | ICD-10-CM

## 2021-06-28 ENCOUNTER — Inpatient Hospital Stay: Payer: Medicare Other | Attending: Internal Medicine

## 2021-06-28 ENCOUNTER — Other Ambulatory Visit: Payer: Self-pay

## 2021-06-28 ENCOUNTER — Inpatient Hospital Stay: Payer: Medicare Other

## 2021-06-28 VITALS — BP 137/87 | HR 88 | Temp 97.8°F | Resp 18

## 2021-06-28 DIAGNOSIS — M810 Age-related osteoporosis without current pathological fracture: Secondary | ICD-10-CM

## 2021-06-28 DIAGNOSIS — Z853 Personal history of malignant neoplasm of breast: Secondary | ICD-10-CM | POA: Diagnosis not present

## 2021-06-28 DIAGNOSIS — C50911 Malignant neoplasm of unspecified site of right female breast: Secondary | ICD-10-CM

## 2021-06-28 LAB — BASIC METABOLIC PANEL
Anion gap: 7 (ref 5–15)
BUN: 20 mg/dL (ref 8–23)
CO2: 27 mmol/L (ref 22–32)
Calcium: 9.3 mg/dL (ref 8.9–10.3)
Chloride: 102 mmol/L (ref 98–111)
Creatinine, Ser: 0.69 mg/dL (ref 0.44–1.00)
GFR, Estimated: >60 mL/min (ref >60)
Glucose, Bld: 99 mg/dL (ref 70–99)
Potassium: 4.3 mmol/L (ref 3.5–5.1)
Sodium: 136 mmol/L (ref 135–145)

## 2021-06-28 MED ORDER — DENOSUMAB 60 MG/ML ~~LOC~~ SOSY
60.0000 mg | PREFILLED_SYRINGE | Freq: Once | SUBCUTANEOUS | Status: AC
  Administered 2021-06-28: 60 mg via SUBCUTANEOUS

## 2021-07-05 ENCOUNTER — Other Ambulatory Visit: Payer: Self-pay | Admitting: Family Medicine

## 2021-07-07 NOTE — Progress Notes (Signed)
Name: Mary Todd   MRN: [id number]    DOB: January 06, 1946   Date:07/08/2021       Progress Note  Subjective  Chief Complaint  Follow Up  HPI  Pre-diabetes: she denies polyphagia, polydipsia or polyuria, last A1C was 5.5%, and today  it was 5.4 %   Other fatigue: she has noticed that she has been waking up feeling tired, going on for the past year. She takes care of two children under 4, but she is feeling tired even when they are not at her house. She thinks she snores, she denies morning headache. ESS of 2 today . She denies feeling depressed, but recent labs all normal, she agrees on trying wellbutrin and following up in 3 months    Urge incontinence: . She is under the care of Urologist, taking Detrol and seems to be improving symptoms, she is also having post tibial neuro stimulation needle electrode therapy monthly. She states nocturia down to once or twice per night. Unchanged   Left lateral foot pain: she saw a podiatrist and since she started gabapentin she is feeling well.    HTN: she is taking  Exforge 5/160 and also metoprolol, tolerating medication well, no chest pain or palpitation, bp is controlled.   Flatulence: states when she stands up she passes gas, discussed avoiding drinking and eating, don't drink from straw and cut down on chewing gum, may try phazyme otc   GERD:she tried stopping Pantoprazole but as soon as she went off medication symptoms became severe, with heartburn  and regurgitation,. She is back on PPI daily    OA: she had TKR right knee on Dec 4 th 2020 by Dr. Harlow Mares , she completed  PT and is doing well, she denies any pain, normal extension but has a catch when flexing at times. She needs to take Meloxicam daily otherwise she feels very stiff. She also takes tylenol at night to control symptoms    History Right Breast Cancer/hodgkins lymphoma : under the care of Dr. Mike Gip  she has been released from her surgeon. . Also diagnosed with hodgkin's  lymphoma in 2004, breast cancer diagnosed in 2011  she stopped Arimidex Feb 2021   Atherosclerosis aorta: on recent CT abdomen, last LDL was around 100, we will start statin therapy today   Fatty liver and hepatomegaly: seen by GI and was advised to try losing weight.   Osteoporosis: diagnosed in 2018 with AP spine score of -3.1 . She gets Prolia at the cancer center and she is tolerating medications well   Thyroid nodules: incidental finding on CT neck , had biopsy by  Dr. Gabriel Carina Dec 2017  and biopsy was negative, she was last seen 05/2020 and has been released from her care . Unchanged    Obesity : she has long history of obesity, she is off  Metformin, her weight is stable, but trending up a little.    Dysthymia: she took Citalopram for years and weaned self off in 2022. She has been feeling tired and wants to stay in bed in the mornings for about one year. Explained fatigue may be depression but we will try Wellbutrin this time  Patient Active Problem List   Diagnosis Date Noted   Total knee replacement status, right 10/03/2019   Encounter for screening mammogram for breast cancer 07/02/2018   Anterolisthesis 08/06/2017   DDD (degenerative disc disease), lumbosacral 08/06/2017   Chondromalacia patellae 07/03/2017   Osteoporosis 12/14/2016   Morbid obesity ([place]) 07/17/2016  Spinal stenosis in cervical region 05/22/2016   Thyroid nodule 05/22/2016   GERD without esophagitis 03/16/2016   Neck muscle spasm 03/16/2016   History of back surgery 03/16/2016   Osteoarthritis of both knees 03/16/2016   Large breasts 03/16/2016   Hypertension, benign 03/16/2016   Perennial allergic rhinitis with seasonal variation 03/16/2016   History of pneumococcal pneumonia 09/20/2015   Hodgkin disease ([place]) 07/15/2014   Breast cancer, right ([place]) 06/26/2013    Past Surgical History:  Procedure Laterality Date   ABDOMINAL HYSTERECTOMY     BACK SURGERY     BREAST BIOPSY Right 2011   radiation    BREAST LUMPECTOMY Right 2011   w/ radiation   BREAST SURGERY Right 2011   right breast lumpectomy,L/SN/R for right breast CA    CATARACT EXTRACTION W/PHACO Left 06/10/2018   Procedure: CATARACT EXTRACTION PHACO AND INTRAOCULAR LENS PLACEMENT ([place]) LEFT;  Surgeon: Eulogio Bear, MD;  Location: [facility];  Service: Ophthalmology;  Laterality: Left;  Diabetic - oral meds   CATARACT EXTRACTION W/PHACO Right 07/15/2018   Procedure: CATARACT EXTRACTION PHACO AND INTRAOCULAR LENS PLACEMENT ([place]) RIGHT;  Surgeon: Eulogio Bear, MD;  Location: [facility];  Service: Ophthalmology;  Laterality: Right;   COLONOSCOPY  2003   PORT A CATH REVISION     REPLACEMENT TOTAL KNEE Right 10/03/2019   DR. [facility]  2005    Family History  Problem Relation Age of Onset   Cancer Father    Bone cancer Father    Breast cancer Mother 52   Stroke Mother    Colon cancer Maternal Grandmother    Breast cancer Cousin     Social History   Tobacco Use   Smoking status: Never   Smokeless tobacco: Never   Tobacco comments:    smoking cessation materials not required  Substance Use Topics   Alcohol use: Yes    Alcohol/week: 2.0 standard drinks    Types: 2 Glasses of wine per week    Comment: occassionally     Current Outpatient Medications:    acetaminophen (TYLENOL) 650 MG CR tablet, Take 1,300 mg by mouth every 8 (eight) hours as needed for pain., Disp: , Rfl:    amLODipine-valsartan (EXFORGE) 5-160 MG tablet, Take 1 tablet by mouth daily., Disp: 90 tablet, Rfl: 1   aspirin 81 MG tablet, Take 81 mg by mouth daily., Disp: , Rfl:    CALCIUM-MAGNESIUM PO, Take 1 tablet by mouth daily., Disp: , Rfl:    cetirizine (ZYRTEC) 10 MG tablet, Take 10 mg by mouth daily., Disp: , Rfl:    Cholecalciferol (VITAMIN D-3 PO), Take 1 tablet by mouth daily., Disp: , Rfl:    denosumab (PROLIA) 60 MG/ML SOSY injection, Inject 60 mg into the skin every 6 (six) months., Disp: , Rfl:     diclofenac Sodium (VOLTAREN) 1 % GEL, Apply 2 g topically daily as needed., Disp: , Rfl:    gabapentin (NEURONTIN) 100 MG capsule, Take 1 capsule (100 mg total) by mouth at bedtime., Disp: 90 capsule, Rfl: 2   meloxicam (MOBIC) 15 MG tablet, Take 1 tablet (15 mg total) by mouth daily., Disp: 90 tablet, Rfl: 1   metoprolol succinate (TOPROL-XL) 50 MG 24 hr tablet, TAKE 1 TABLET (50 MG TOTAL) BY MOUTH DAILY. TAKE WITH OR IMMEDIATELY FOLLOWING A MEAL., Disp: 90 tablet, Rfl: 0   Multiple Vitamin (MULTIVITAMIN) tablet, Take 1 tablet by mouth daily., Disp: , Rfl:    NYSTATIN powder,  APPLY  POWDER TOPICALLY THREE TIMES DAILY, Disp: 60 g, Rfl: 0   pantoprazole (PROTONIX) 40 MG tablet, Take 1 tablet (40 mg total) by mouth daily., Disp: 90 tablet, Rfl: 1   tolterodine (DETROL LA) 4 MG 24 hr capsule, Take 1 capsule (4 mg total) by mouth daily., Disp: 90 capsule, Rfl: 3   ipratropium (ATROVENT) 0.06 % nasal spray, Place 2 sprays into both nostrils 4 (four) times daily. (Patient not taking: Reported on 07/08/2021), Disp: 15 mL, Rfl: 12   promethazine-dextromethorphan (PROMETHAZINE-DM) 6.25-15 MG/5ML syrup, Take 5 mLs by mouth 4 (four) times daily as needed. (Patient not taking: Reported on 07/08/2021), Disp: 118 mL, Rfl: 0  Allergies  Allergen Reactions   Shrimp [Shellfish Allergy] Hives   Augmentin [Amoxicillin-Pot Clavulanate] Nausea Only    I personally reviewed active problem list, medication list, allergies, family history, social history, health maintenance with the patient/caregiver today.   ROS  Constitutional: Negative for fever or weight change.  Respiratory: Negative for cough and shortness of breath.   Cardiovascular: Negative for chest pain or palpitations.  Gastrointestinal: Negative for abdominal pain, no bowel changes.  Musculoskeletal: Negative for gait problem or joint swelling.  Skin: Negative for rash.  Neurological: Negative for dizziness or headache.  No other specific complaints in  a complete review of systems (except as listed in HPI above).   Objective  Vitals:   07/08/21 1056  BP: 128/82  Pulse: 87  Resp: 16  Temp: 97.6 F (36.4 C)  SpO2: 95%  Weight: 229 lb (103.9 kg)  Height: '5\' 3"'$  (1.6 m)    Body mass index is 40.57 kg/m.  Physical Exam  Constitutional: Patient appears well-developed and well-nourished. Obese  No distress.  HEENT: head atraumatic, normocephalic, pupils equal and reactive to light, neck supple Cardiovascular: Normal rate, regular rhythm and normal heart sounds.  No murmur heard. No BLE edema. Pulmonary/Chest: Effort normal and breath sounds normal. No respiratory distress. Abdominal: Soft.  There is no tenderness. Psychiatric: Patient has a normal mood and affect. behavior is normal. Judgment and thought content normal.   Recent Results (from the past 2160 hour(s))  Comprehensive metabolic panel     Status: Abnormal   Collection Time: 04/14/21  2:30 PM  Result Value Ref Range   Sodium 136 135 - 145 mmol/L   Potassium 4.7 3.5 - 5.1 mmol/L   Chloride 104 98 - 111 mmol/L   CO2 26 22 - 32 mmol/L   Glucose, Bld 123 (H) 70 - 99 mg/dL    Comment: Glucose reference range applies only to samples taken after fasting for at least 8 hours.   BUN 19 8 - 23 mg/dL   Creatinine, Ser 0.61 0.44 - 1.00 mg/dL   Calcium 9.2 8.9 - 10.3 mg/dL   Total Protein 7.5 6.5 - 8.1 g/dL   Albumin 4.0 3.5 - 5.0 g/dL   AST 24 15 - 41 U/L   ALT 30 0 - 44 U/L   Alkaline Phosphatase 69 38 - 126 U/L   Total Bilirubin 0.6 0.3 - 1.2 mg/dL   GFR, Estimated >60 >60 mL/min    Comment: (NOTE) Calculated using the CKD-EPI Creatinine Equation (2021)    Anion gap 6 5 - 15    Comment: Performed at [facility], [address] [place], [place] [address]  CBC with Differential/Platelet     Status: None   Collection Time: 04/14/21  2:30 PM  Result Value Ref Range   WBC 7.8 4.0 - 10.5  K/uL   RBC 4.07 3.87 - 5.11 MIL/uL   Hemoglobin 12.5 12.0 - 15.0 g/dL    HCT 36.2 36.0 - 46.0 %   MCV 88.9 80.0 - 100.0 fL   MCH 30.7 26.0 - 34.0 pg   MCHC 34.5 30.0 - 36.0 g/dL   RDW 14.0 11.5 - 15.5 %   Platelets 226 150 - 400 K/uL   nRBC 0.0 0.0 - 0.2 %   Neutrophils Relative % 47 %   Neutro Abs 3.6 1.7 - 7.7 K/uL   Lymphocytes Relative 39 %   Lymphs Abs 3.1 0.7 - 4.0 K/uL   Monocytes Relative 7 %   Monocytes Absolute 0.6 0.1 - 1.0 K/uL   Eosinophils Relative 6 %   Eosinophils Absolute 0.5 0.0 - 0.5 K/uL   Basophils Relative 1 %   Basophils Absolute 0.1 0.0 - 0.1 K/uL   Immature Granulocytes 0 %   Abs Immature Granulocytes 0.02 0.00 - 0.07 K/uL    Comment: Performed at [facility], [address], [place], [place] [address]  Lactate dehydrogenase     Status: None   Collection Time: 04/14/21  2:30 PM  Result Value Ref Range   LDH 136 98 - 192 U/L    Comment: Performed at [facility], [address], [place], [place] [address]  TSH     Status: None   Collection Time: 04/14/21  2:30 PM  Result Value Ref Range   TSH 0.604 0.350 - 4.500 uIU/mL    Comment: Performed by a 3rd Generation assay with a functional sensitivity of <=0.01 uIU/mL. Performed at [facility], [address] [place], [place] [address]   T4, free     Status: None   Collection Time: 04/14/21  2:30 PM  Result Value Ref Range   Free T4 0.86 0.61 - 1.12 ng/dL    Comment: (NOTE) Biotin ingestion may interfere with free T4 tests. If the results are inconsistent with the TSH level, previous test results, or the clinical presentation, then consider biotin interference. If needed, order repeat testing after stopping biotin. Performed at [facility], [address] [place], [place] [id number]   Basic metabolic panel     Status: None   Collection Time: 06/28/21  1:25 PM  Result Value Ref Range   Sodium 136 135 - 145 mmol/L   Potassium 4.3 3.5 - 5.1 mmol/L   Chloride 102 98 - 111 mmol/L   CO2 27 22 - 32 mmol/L   Glucose, Bld 99  70 - 99 mg/dL    Comment: Glucose reference range applies only to samples taken after fasting for at least 8 hours.   BUN 20 8 - 23 mg/dL   Creatinine, Ser 0.69 0.44 - 1.00 mg/dL   Calcium 9.3 8.9 - 10.3 mg/dL   GFR, Estimated >60 >60 mL/min    Comment: (NOTE) Calculated using the CKD-EPI Creatinine Equation (2021)    Anion gap 7 5 - 15    Comment: Performed at [facility], [address], [place],  [address]     PHQ2/9: Depression screen [facility] 2/9 07/08/2021 03/31/2021 02/25/2021 01/04/2021 07/06/2020  Decreased Interest 0 0 0 0 0  Down, Depressed, Hopeless 0 0 0 0 0  PHQ - 2 Score 0 0 0 0 0  Altered sleeping 0 - - 0 0  Tired, decreased energy 3 - - 0 0  Change in appetite 0 - - 0 0  Feeling bad or failure  about yourself  3 - - 0 0  Trouble concentrating 0 - - 0 0  Moving slowly or fidgety/restless 0 - - 0 0  Suicidal thoughts 0 - - 0 0  PHQ-9 Score 6 - - 0 0  Difficult doing work/chores - - - - -  Some recent data might be hidden    phq 9 is negative   Fall Risk: Fall Risk  07/08/2021 03/31/2021 02/25/2021 01/04/2021 07/06/2020  Falls in the past year? 0 0 0 0 1  Number falls in past yr: 0 0 0 0 0  Injury with Fall? 0 0 0 0 0  Risk for fall due to : No Fall Risks No Fall Risks - - -  Follow up Falls prevention discussed - Falls evaluation completed - -      Functional Status Survey: Is the patient deaf or have difficulty hearing?: No Does the patient have difficulty seeing, even when wearing glasses/contacts?: No Does the patient have difficulty concentrating, remembering, or making decisions?: No Does the patient have difficulty walking or climbing stairs?: No Does the patient have difficulty dressing or bathing?: No Does the patient have difficulty doing errands alone such as visiting a doctor's office or shopping?: No    Assessment & Plan  1. Atherosclerosis of aorta ([facility])  Discussed staring on statin therapy   2. Hodgkin lymphoma of intra-abdominal  lymph nodes, unspecified Hodgkin lymphoma type ([place])   3. Morbid obesity ([place])  Discussed with the patient the risk posed by an increased BMI. Discussed importance of portion control, calorie counting and at least 150 minutes of physical activity weekly. Avoid sweet beverages and drink more water. Eat at least 6 servings of fruit and vegetables daily    4. Hodgkin's disease, stage IIA ([place])  Under the care of hematologist   5. Need for immunization against influenza  - Flu Vaccine QUAD High Dose(Fluad)  6. Essential hypertension  - amLODipine-valsartan (EXFORGE) 5-160 MG tablet; Take 1 tablet by mouth daily.  Dispense: 90 tablet; Refill: 1 - metoprolol succinate (TOPROL-XL) 50 MG 24 hr tablet; Take 1 tablet (50 mg total) by mouth daily. Take with or immediately following a meal.  Dispense: 90 tablet; Refill: 1  7. Primary osteoarthritis of both knees  - meloxicam (MOBIC) 15 MG tablet; Take 1 tablet (15 mg total) by mouth daily.  Dispense: 90 tablet; Refill: 1  8. GERD without esophagitis  - pantoprazole (PROTONIX) 40 MG tablet; Take 1 tablet (40 mg total) by mouth daily.  Dispense: 90 tablet; Refill: 1  9. Pre-diabetes   10. Age-related osteoporosis without current pathological fracture   11. Hyperglycemia  - POCT HgB A1C  12. Hepatic steatosis   13. Thyroid nodule   14. Fatigue, unspecified type  - buPROPion (WELLBUTRIN XL) 150 MG 24 hr tablet; Take 1 tablet (150 mg total) by mouth daily.  Dispense: 90 tablet; Refill: 0   15. Dysthymia  - buPROPion (WELLBUTRIN XL) 150 MG 24 hr tablet; Take 1 tablet (150 mg total) by mouth daily.  Dispense: 90 tablet; Refill: 0

## 2021-07-08 ENCOUNTER — Other Ambulatory Visit: Payer: Self-pay

## 2021-07-08 ENCOUNTER — Encounter: Payer: Self-pay | Admitting: Family Medicine

## 2021-07-08 ENCOUNTER — Ambulatory Visit (INDEPENDENT_AMBULATORY_CARE_PROVIDER_SITE_OTHER): Payer: Medicare Other | Admitting: Family Medicine

## 2021-07-08 VITALS — BP 128/82 | HR 87 | Temp 97.6°F | Resp 16 | Ht 63.0 in | Wt 229.0 lb

## 2021-07-08 DIAGNOSIS — K219 Gastro-esophageal reflux disease without esophagitis: Secondary | ICD-10-CM | POA: Diagnosis not present

## 2021-07-08 DIAGNOSIS — R5383 Other fatigue: Secondary | ICD-10-CM

## 2021-07-08 DIAGNOSIS — F341 Dysthymic disorder: Secondary | ICD-10-CM

## 2021-07-08 DIAGNOSIS — M810 Age-related osteoporosis without current pathological fracture: Secondary | ICD-10-CM

## 2021-07-08 DIAGNOSIS — R739 Hyperglycemia, unspecified: Secondary | ICD-10-CM

## 2021-07-08 DIAGNOSIS — E041 Nontoxic single thyroid nodule: Secondary | ICD-10-CM

## 2021-07-08 DIAGNOSIS — K760 Fatty (change of) liver, not elsewhere classified: Secondary | ICD-10-CM

## 2021-07-08 DIAGNOSIS — Z23 Encounter for immunization: Secondary | ICD-10-CM | POA: Diagnosis not present

## 2021-07-08 DIAGNOSIS — I700 Atherosclerosis of aorta: Secondary | ICD-10-CM | POA: Diagnosis not present

## 2021-07-08 DIAGNOSIS — M170 Bilateral primary osteoarthritis of knee: Secondary | ICD-10-CM | POA: Diagnosis not present

## 2021-07-08 DIAGNOSIS — I10 Essential (primary) hypertension: Secondary | ICD-10-CM | POA: Diagnosis not present

## 2021-07-08 DIAGNOSIS — R7303 Prediabetes: Secondary | ICD-10-CM | POA: Diagnosis not present

## 2021-07-08 DIAGNOSIS — C8193 Hodgkin lymphoma, unspecified, intra-abdominal lymph nodes: Secondary | ICD-10-CM | POA: Diagnosis not present

## 2021-07-08 DIAGNOSIS — C8190 Hodgkin lymphoma, unspecified, unspecified site: Secondary | ICD-10-CM | POA: Diagnosis not present

## 2021-07-08 LAB — POCT GLYCOSYLATED HEMOGLOBIN (HGB A1C): Hemoglobin A1C: 5.4 % (ref 4.0–5.6)

## 2021-07-08 MED ORDER — MELOXICAM 15 MG PO TABS: 15.0000 mg | ORAL_TABLET | Freq: Every day | ORAL | 1 refills | Status: DC

## 2021-07-08 MED ORDER — PANTOPRAZOLE SODIUM 40 MG PO TBEC: 40.0000 mg | DELAYED_RELEASE_TABLET | Freq: Every day | ORAL | 1 refills | Status: DC

## 2021-07-08 MED ORDER — METOPROLOL SUCCINATE ER 50 MG PO TB24: 50.0000 mg | ORAL_TABLET | Freq: Every day | ORAL | 1 refills | Status: DC

## 2021-07-08 MED ORDER — ATORVASTATIN CALCIUM 10 MG PO TABS: 10.0000 mg | ORAL_TABLET | Freq: Every day | ORAL | 0 refills | Status: DC

## 2021-07-08 MED ORDER — BUPROPION HCL ER (XL) 150 MG PO TB24: 150.0000 mg | ORAL_TABLET | Freq: Every day | ORAL | 0 refills | Status: DC

## 2021-07-08 MED ORDER — AMLODIPINE BESYLATE-VALSARTAN 5-160 MG PO TABS: 1.0000 | ORAL_TABLET | Freq: Every day | ORAL | 1 refills | Status: DC

## 2021-07-14 ENCOUNTER — Telehealth: Payer: Self-pay | Admitting: *Deleted

## 2021-07-14 NOTE — Chronic Care Management (AMB) (Signed)
  Chronic Care Management   Note  07/14/2021 Name: Luretta Everly MRN: [id number] DOB: September 24, 1946  Mary Todd is a 75 y.o. year old female who is a primary care patient of Steele Sizer, MD. I reached out to Anner Crete by phone today in response to a referral sent by Ms. Leana Gamer Rancourt's PCP Steele Sizer, MD     Ms. Dettmann was given information about Chronic Care Management services today including:  CCM service includes personalized support from designated clinical staff supervised by her physician, including individualized plan of care and coordination with other care providers 24/7 contact phone numbers for assistance for urgent and routine care needs. Service will only be billed when office clinical staff spend 20 minutes or more in a month to coordinate care. Only one practitioner may furnish and bill the service in a calendar month. The patient may stop CCM services at any time (effective at the end of the month) by phone call to the office staff. The patient will be responsible for cost sharing (co-pay) of up to 20% of the service fee (after annual deductible is met).  Patient agreed to services and verbal consent obtained.   Follow up plan: Telephone appointment with care management team member scheduled for: 07/21/2021  Julian Hy, [facility]  Direct Dial: [phone]

## 2021-07-21 ENCOUNTER — Ambulatory Visit (INDEPENDENT_AMBULATORY_CARE_PROVIDER_SITE_OTHER): Payer: Medicare Other

## 2021-07-21 DIAGNOSIS — I700 Atherosclerosis of aorta: Secondary | ICD-10-CM

## 2021-07-21 DIAGNOSIS — I10 Essential (primary) hypertension: Secondary | ICD-10-CM

## 2021-07-21 NOTE — Patient Instructions (Addendum)
Thank you for allowing the Chronic Care Management team to participate in your care.    Patient Care Plan: Hypertension and Atherosclerosis of Aorta     Problem Identified: Hypertension and Atherosclerosis of Aorta      Long-Range Goal: Hypertension and Atherosclerosis   Start Date: 07/21/2021  Expected End Date: 10/19/2021  Priority: Medium  Note:   Objective:  Last practice recorded BP readings:  BP Readings from Last 3 Encounters:  07/08/21 128/82  06/28/21 137/87  06/15/21 (!) 145/88   Most recent eGFR/CrCl: No results found for: EGFR  No components found for: CRCL  Current Barriers:  Chronic Disease Management support and educational needs related to HTN and Atherosclerosis of Aorta.   Case Manager Clinical Goal(s):  Over the next 120 days, patient will demonstrate continued adherence to prescribed treatment plan as evidenced by taking all medications as prescribed, monitoring and recording blood pressure and adhering to low sodium/DASH diet.  Interventions:  Collaboration with Steele Sizer, MD regarding development and update of comprehensive plan of care as evidenced by provider attestation and co-signature Inter-disciplinary care team collaboration (see longitudinal plan of care) Reviewed medications. Advised to continue taking medications as prescribed and notify provider if unable to tolerate regimen.    Provided information regarding established blood pressure parameters along with indications for notifying a provider. Encouraged to monitor and record readings.  Discussed compliance with recommended cardiac prudent diet. Encouraged to read nutrition labels, monitor sodium intake, and avoid highly processed foods when possible. Reviewed s/sx of heart attack, stroke and worsening symptoms that require immediate medical attention.  Patient Goals/Self-Care Activities: Self-administer medications as prescribed Monitor and record blood pressure Adhere to recommended  cardiac prudent/heart healthy diet Notify provider or care management team with questions and new concerns as needed  Follow Up Plan:  Will follow up in three months       Mary Todd verbalized understanding of the information discussed during the telephonic outreach. Declined need for mailed/printed instructions. A member of the care management team will follow up in three months.    [facility] [phone]

## 2021-07-21 NOTE — Chronic Care Management (AMB) (Signed)
Chronic Care Management   CCM RN Visit Note  07/21/2021 Name: Mary Todd MRN: [id number] DOB: 1946-05-18  Subjective: Mary Todd is a 75 y.o. year old female who is a primary care patient of Steele Sizer, MD. The care management team was consulted for assistance with disease management and care coordination needs.    Engaged with patient by telephone for initial visit in response to provider referral for case management and care coordination services.   Consent to Services:  The patient was given the following information about Chronic Care Management services: 1. CCM service includes personalized support from designated clinical staff supervised by the primary care provider, including individualized plan of care and coordination with other care providers 2. 24/7 contact phone numbers for assistance for urgent and routine care needs. 3. Service will only be billed when office clinical staff spend 20 minutes or more in a month to coordinate care. 4. Only one practitioner may furnish and bill the service in a calendar month. 5.The patient may stop CCM services at any time (effective at the end of the month) by phone call to the office staff. 6. The patient will be responsible for cost sharing (co-pay) of up to 20% of the service fee (after annual deductible is met). Patient agreed to services and consent obtained.   Assessment: Review of patient past medical history, allergies, medications, health status, including review of consultants reports, laboratory and other test data, was performed as part of comprehensive evaluation and provision of chronic care management services.   SDOH (Social Determinants of Health) assessments and interventions performed:  SDOH Interventions    Flowsheet Row Most Recent Value  SDOH Interventions   Food Insecurity Interventions Intervention Not Indicated  Transportation Interventions Intervention Not Indicated        CCM Care  Plan  Allergies  Allergen Reactions   Shrimp [Shellfish Allergy] Hives   Augmentin [Amoxicillin-Pot Clavulanate] Nausea Only    Outpatient Encounter Medications as of 07/21/2021  Medication Sig   amLODipine-valsartan (EXFORGE) 5-160 MG tablet Take 1 tablet by mouth daily.   aspirin 81 MG tablet Take 81 mg by mouth daily.   atorvastatin (LIPITOR) 10 MG tablet Take 1 tablet (10 mg total) by mouth daily.   buPROPion (WELLBUTRIN XL) 150 MG 24 hr tablet Take 1 tablet (150 mg total) by mouth daily.   CALCIUM-MAGNESIUM PO Take 1 tablet by mouth daily.   cetirizine (ZYRTEC) 10 MG tablet Take 10 mg by mouth daily.   Cholecalciferol (VITAMIN D-3 PO) Take 1 tablet by mouth daily.   denosumab (PROLIA) 60 MG/ML SOSY injection Inject 60 mg into the skin every 6 (six) months.   meloxicam (MOBIC) 15 MG tablet Take 1 tablet (15 mg total) by mouth daily.   metoprolol succinate (TOPROL-XL) 50 MG 24 hr tablet Take 1 tablet (50 mg total) by mouth daily. Take with or immediately following a meal.   Multiple Vitamin (MULTIVITAMIN) tablet Take 1 tablet by mouth daily.   pantoprazole (PROTONIX) 40 MG tablet Take 1 tablet (40 mg total) by mouth daily.   tolterodine (DETROL LA) 4 MG 24 hr capsule Take 1 capsule (4 mg total) by mouth daily.   acetaminophen (TYLENOL) 650 MG CR tablet Take 1,300 mg by mouth every 8 (eight) hours as needed for pain.   gabapentin (NEURONTIN) 100 MG capsule Take 1 capsule (100 mg total) by mouth at bedtime.   NYSTATIN powder APPLY  POWDER TOPICALLY THREE TIMES DAILY   No facility-administered encounter medications on  file as of 07/21/2021.    Patient Active Problem List   Diagnosis Date Noted   Total knee replacement status, right 10/03/2019   Encounter for screening mammogram for breast cancer 07/02/2018   Anterolisthesis 08/06/2017   DDD (degenerative disc disease), lumbosacral 08/06/2017   Chondromalacia patellae 07/03/2017   Osteoporosis 12/14/2016   Morbid obesity ([place])  07/17/2016   Spinal stenosis in cervical region 05/22/2016   Thyroid nodule 05/22/2016   GERD without esophagitis 03/16/2016   Neck muscle spasm 03/16/2016   History of back surgery 03/16/2016   Osteoarthritis of both knees 03/16/2016   Large breasts 03/16/2016   Hypertension, benign 03/16/2016   Perennial allergic rhinitis with seasonal variation 03/16/2016   History of pneumococcal pneumonia 09/20/2015   Hodgkin disease ([place]) 07/15/2014   Breast cancer, right ([place]) 06/26/2013    Conditions to be addressed/monitored:HTN and Atherosclerosis of Aorta Patient Care Plan: Hypertension and Atherosclerosis of Aorta     Problem Identified: Hypertension and Atherosclerosis of Aorta      Long-Range Goal: Hypertension and Atherosclerosis   Start Date: 07/21/2021  Expected End Date: 10/19/2021  Priority: Medium  Note:   Objective:  Last practice recorded BP readings:  BP Readings from Last 3 Encounters:  07/08/21 128/82  06/28/21 137/87  06/15/21 (!) 145/88   Most recent eGFR/CrCl: No results found for: EGFR  No components found for: CRCL  Current Barriers:  Chronic Disease Management support and educational needs related to HTN and Atherosclerosis of Aorta.   Case Manager Clinical Goal(s):  Over the next 120 days, patient will demonstrate continued adherence to prescribed treatment plan as evidenced by taking all medications as prescribed, monitoring and recording blood pressure and adhering to low sodium/DASH diet.  Interventions:  Collaboration with Steele Sizer, MD regarding development and update of comprehensive plan of care as evidenced by provider attestation and co-signature Inter-disciplinary care team collaboration (see longitudinal plan of care) Reviewed medications. Advised to continue taking medications as prescribed and notify provider if unable to tolerate regimen.    Provided information regarding established blood pressure parameters along with indications for  notifying a provider. Encouraged to monitor and record readings.  Discussed compliance with recommended cardiac prudent diet. Encouraged to read nutrition labels, monitor sodium intake, and avoid highly processed foods when possible. Reviewed s/sx of heart attack, stroke and worsening symptoms that require immediate medical attention.  Patient Goals/Self-Care Activities: Self-administer medications as prescribed Monitor and record blood pressure Adhere to recommended cardiac prudent/heart healthy diet Notify provider or care management team with questions and new concerns as needed  Follow Up Plan:  Will follow up in three months       PLAN: A member of the care management team will follow up in three months.    [name] [phone]

## 2021-07-25 ENCOUNTER — Other Ambulatory Visit: Payer: Self-pay

## 2021-07-25 ENCOUNTER — Ambulatory Visit (INDEPENDENT_AMBULATORY_CARE_PROVIDER_SITE_OTHER): Payer: Medicare Other | Admitting: Urology

## 2021-07-25 ENCOUNTER — Encounter: Payer: Self-pay | Admitting: Urology

## 2021-07-25 DIAGNOSIS — N3946 Mixed incontinence: Secondary | ICD-10-CM | POA: Diagnosis not present

## 2021-07-25 NOTE — Progress Notes (Signed)
Patient ID: Mary Todd, female   DOB: [birth year], 75 y.o.   MRN: [id number] PTNS  Session # 25 of 45  Health & Social Factors: no change Caffeine: 1 Alcohol: 0 Daytime voids #per day: 3-4 Night-time voids #per night: 1-2 Urgency: mild Incontinence Episodes #per day: 0 Ankle used: left Treatment Setting: 4 Feeling/ Response: both Comments: pt tolerated well  Performed By: Edwin Dada, CMA  Follow Up: 1 month for #26 of 45

## 2021-07-27 ENCOUNTER — Other Ambulatory Visit: Payer: Self-pay

## 2021-07-27 ENCOUNTER — Ambulatory Visit
Admission: RE | Admit: 2021-07-27 | Discharge: 2021-07-27 | Disposition: A | Discharge status: Home / Self Care | Payer: Medicare Other | Source: Ambulatory Visit | Attending: Oncology | Admitting: Oncology

## 2021-07-27 DIAGNOSIS — C50911 Malignant neoplasm of unspecified site of right female breast: Secondary | ICD-10-CM | POA: Diagnosis not present

## 2021-07-27 DIAGNOSIS — Z1231 Encounter for screening mammogram for malignant neoplasm of breast: Secondary | ICD-10-CM | POA: Diagnosis not present

## 2021-07-29 DIAGNOSIS — I10 Essential (primary) hypertension: Secondary | ICD-10-CM | POA: Diagnosis not present

## 2021-08-08 DIAGNOSIS — Z23 Encounter for immunization: Secondary | ICD-10-CM | POA: Diagnosis not present

## 2021-08-14 DIAGNOSIS — R0902 Hypoxemia: Secondary | ICD-10-CM | POA: Diagnosis not present

## 2021-08-14 DIAGNOSIS — R079 Chest pain, unspecified: Secondary | ICD-10-CM | POA: Diagnosis not present

## 2021-08-14 DIAGNOSIS — G459 Transient cerebral ischemic attack, unspecified: Secondary | ICD-10-CM | POA: Diagnosis not present

## 2021-08-14 DIAGNOSIS — I10 Essential (primary) hypertension: Secondary | ICD-10-CM | POA: Diagnosis not present

## 2021-08-14 DIAGNOSIS — Z79899 Other long term (current) drug therapy: Secondary | ICD-10-CM | POA: Diagnosis not present

## 2021-08-14 DIAGNOSIS — R109 Unspecified abdominal pain: Secondary | ICD-10-CM | POA: Diagnosis not present

## 2021-08-14 DIAGNOSIS — R55 Syncope and collapse: Secondary | ICD-10-CM | POA: Diagnosis not present

## 2021-08-14 DIAGNOSIS — R0602 Shortness of breath: Secondary | ICD-10-CM | POA: Diagnosis not present

## 2021-08-14 DIAGNOSIS — R42 Dizziness and giddiness: Secondary | ICD-10-CM | POA: Diagnosis not present

## 2021-08-15 ENCOUNTER — Encounter: Payer: Self-pay | Admitting: Family Medicine

## 2021-08-18 NOTE — Progress Notes (Signed)
Name: Mary Todd   MRN: [id number]    DOB: 03-27-1946   Date:08/19/2021       Progress Note  Subjective  [facility] Follow Up  HPI  Syncopal Episode: [place] the [place] with her family. She was sitting down with on a stool  waiting for her nephew who was on the [place] rides. Her husband was near her and she noticed some blurred vision and felt like everything was moving away from her. She lost consciousness, her husband help her lay down on the ground. When she woke up two EMS responders were calling her name, she had vomit all over herself and bp was low, when she got home she realized she had bowel incontinence She was advised to go to [facility], she went to the closest facility [facility]) She felt drained and tired for about 30-1 hour after the episode, but could recall all events prior to incident  She had a previous episode in July while at the hospital with her 36 yo grandson - that was self cutting. She states she was standing up and same sensation occur of the room moving away from her, she feel down and hit her head, she was transported to [facility] , she also had bowel incontinence on that time.   Denies family or personal history of seizures. She states she has a history of passing out in the past, she used to blame on dehydration. She states she had at least 6 episodes of syncope throughout her life. She thinks first episode in her 65's. She has never seen a neurologist or cardiologist .  I saw her about one month ago and we started her on Wellbutrin for fatigue and she has noticed improvement of symptoms, but bp also was up for about one week, but better over the past few days. She was also given statin therapy and she stopped on her own thinking it was causing bp to go up, explained likely not the statin but the wellbutrin, since she has been having syncopal episodes that may be secondary to seizures we will stop wellbutrin .   Denies headache, chest  pain, SOB or diaphoresis, not anxious when it happens, unpredictable and only warning is visual changes   Patient Active Problem List   Diagnosis Date Noted   Total knee replacement status, right 10/03/2019   Encounter for screening mammogram for breast cancer 07/02/2018   Anterolisthesis 08/06/2017   DDD (degenerative disc disease), lumbosacral 08/06/2017   Chondromalacia patellae 07/03/2017   Osteoporosis 12/14/2016   Morbid obesity ([place]) 07/17/2016   Spinal stenosis in cervical region 05/22/2016   Thyroid nodule 05/22/2016   GERD without esophagitis 03/16/2016   Neck muscle spasm 03/16/2016   History of back surgery 03/16/2016   Osteoarthritis of both knees 03/16/2016   Large breasts 03/16/2016   Hypertension, benign 03/16/2016   Perennial allergic rhinitis with seasonal variation 03/16/2016   History of pneumococcal pneumonia 09/20/2015   Hodgkin disease ([place]) 07/15/2014   Breast cancer, right ([place]) 06/26/2013    Past Surgical History:  Procedure Laterality Date   ABDOMINAL HYSTERECTOMY     BACK SURGERY     BREAST BIOPSY Right 2011   radiation   BREAST LUMPECTOMY Right 2011   w/ radiation   BREAST SURGERY Right 2011   right breast lumpectomy,L/SN/R for right breast CA    CATARACT EXTRACTION W/PHACO Left 06/10/2018   Procedure: CATARACT EXTRACTION PHACO AND INTRAOCULAR LENS PLACEMENT (IOC) LEFT;  Surgeon: Eulogio Bear, MD;  Location: [place];  Service: Ophthalmology;  Laterality: Left;  Diabetic - oral meds   CATARACT EXTRACTION W/PHACO Right 07/15/2018   Procedure: CATARACT EXTRACTION PHACO AND INTRAOCULAR LENS PLACEMENT ([place]) RIGHT;  Surgeon: Eulogio Bear, MD;  Location: [place];  Service: Ophthalmology;  Laterality: Right;   COLONOSCOPY  2003   PORT A CATH REVISION     REPLACEMENT TOTAL KNEE Right 10/03/2019   DR. Moffat  2005    Family History  Problem Relation Age of Onset   Cancer Father    Bone cancer Father     Breast cancer Mother 45   Stroke Mother    Colon cancer Maternal Grandmother    Breast cancer Cousin     Social History   Tobacco Use   Smoking status: Never   Smokeless tobacco: Never   Tobacco comments:    smoking cessation materials not required  Substance Use Topics   Alcohol use: Yes    Alcohol/week: 2.0 standard drinks    Types: 2 Glasses of wine per week    Comment: occassionally     Current Outpatient Medications:    acetaminophen (TYLENOL) 650 MG CR tablet, Take 1,300 mg by mouth every 8 (eight) hours as needed for pain., Disp: , Rfl:    amLODipine-valsartan (EXFORGE) 5-160 MG tablet, Take 1 tablet by mouth daily., Disp: 90 tablet, Rfl: 1   aspirin 81 MG tablet, Take 81 mg by mouth daily., Disp: , Rfl:    atorvastatin (LIPITOR) 10 MG tablet, Take 1 tablet (10 mg total) by mouth daily., Disp: 90 tablet, Rfl: 0   buPROPion (WELLBUTRIN XL) 150 MG 24 hr tablet, Take 1 tablet (150 mg total) by mouth daily., Disp: 90 tablet, Rfl: 0   CALCIUM-MAGNESIUM PO, Take 1 tablet by mouth daily., Disp: , Rfl:    cetirizine (ZYRTEC) 10 MG tablet, Take 10 mg by mouth daily., Disp: , Rfl:    Cholecalciferol (VITAMIN D-3 PO), Take 1 tablet by mouth daily., Disp: , Rfl:    denosumab (PROLIA) 60 MG/ML SOSY injection, Inject 60 mg into the skin every 6 (six) months., Disp: , Rfl:    gabapentin (NEURONTIN) 100 MG capsule, Take 1 capsule (100 mg total) by mouth at bedtime., Disp: 90 capsule, Rfl: 2   meloxicam (MOBIC) 15 MG tablet, Take 1 tablet (15 mg total) by mouth daily., Disp: 90 tablet, Rfl: 1   metoprolol succinate (TOPROL-XL) 50 MG 24 hr tablet, Take 1 tablet (50 mg total) by mouth daily. Take with or immediately following a meal., Disp: 90 tablet, Rfl: 1   Multiple Vitamin (MULTIVITAMIN) tablet, Take 1 tablet by mouth daily., Disp: , Rfl:    NYSTATIN powder, APPLY  POWDER TOPICALLY THREE TIMES DAILY, Disp: 60 g, Rfl: 0   pantoprazole (PROTONIX) 40 MG tablet, Take 1 tablet (40 mg  total) by mouth daily., Disp: 90 tablet, Rfl: 1   tolterodine (DETROL LA) 4 MG 24 hr capsule, Take 1 capsule (4 mg total) by mouth daily., Disp: 90 capsule, Rfl: 3  Allergies  Allergen Reactions   Shrimp [Shellfish Allergy] Hives   Augmentin [Amoxicillin-Pot Clavulanate] Nausea Only    I personally reviewed active problem list, medication list, allergies, family history, social history, health maintenance with the patient/caregiver today.   ROS  Constitutional: Negative for fever or weight change.  Respiratory: Negative for cough and shortness of breath.   Cardiovascular: Negative for chest pain or palpitations.  Gastrointestinal: Negative for abdominal pain, no bowel changes.  Musculoskeletal: Negative for gait problem or joint swelling.  Skin: Negative for rash.  Neurological: Negative for dizziness or headache.  No other specific complaints in a complete review of systems (except as listed in HPI above).   Objective  Vitals:   08/19/21 0846  BP: 130/72  Pulse: 89  Resp: 18  Temp: 97.9 F (36.6 C)  TempSrc: Oral  SpO2: 97%  Weight: 227 lb 9.6 oz (103.2 kg)  Height: 5\' 3"  (1.6 m)    Body mass index is 40.32 kg/m.  Physical Exam  Constitutional: Patient appears well-developed and well-nourished. Obese  No distress.  HEENT: head atraumatic, normocephalic, pupils equal and reactive to light,  neck supple Cardiovascular: Normal rate, regular rhythm and normal heart sounds.  No murmur heard. No BLE edema. Pulmonary/Chest: Effort normal and breath sounds normal. No respiratory distress. Abdominal: Soft.  There is no tenderness. Psychiatric: Patient has a normal mood and affect. behavior is normal. Judgment and thought content normal.  Neurological: normal exam   Recent Results (from the past 2160 hour(s))  Basic metabolic panel     Status: None   Collection Time: 06/28/21  1:25 PM  Result Value Ref Range   Sodium 136 135 - 145 mmol/L   Potassium 4.3 3.5 - 5.1 mmol/L    Chloride 102 98 - 111 mmol/L   CO2 27 22 - 32 mmol/L   Glucose, Bld 99 70 - 99 mg/dL    Comment: Glucose reference range applies only to samples taken after fasting for at least 8 hours.   BUN 20 8 - 23 mg/dL   Creatinine, Ser 0.69 0.44 - 1.00 mg/dL   Calcium 9.3 8.9 - 10.3 mg/dL   GFR, Estimated >60 >60 mL/min    Comment: (NOTE) Calculated using the CKD-EPI Creatinine Equation (2021)    Anion gap 7 5 - 15    Comment: Performed at [facility], [address] [place], [place] [address]  POCT HgB A1C     Status: None   Collection Time: 07/08/21 11:25 AM  Result Value Ref Range   Hemoglobin A1C 5.4 4.0 - 5.6 %   HbA1c POC (<> result, manual entry)     HbA1c, POC (prediabetic range)     HbA1c, POC (controlled diabetic range)        PHQ2/9: Depression screen [facility] 2/9 08/19/2021 07/08/2021 03/31/2021 02/25/2021 01/04/2021  Decreased Interest 0 0 0 0 0  Down, Depressed, Hopeless 0 0 0 0 0  PHQ - 2 Score 0 0 0 0 0  Altered sleeping 0 0 - - 0  Tired, decreased energy 0 3 - - 0  Change in appetite 0 0 - - 0  Feeling bad or failure about yourself  0 3 - - 0  Trouble concentrating 0 0 - - 0  Moving slowly or fidgety/restless 0 0 - - 0  Suicidal thoughts 0 0 - - 0  PHQ-9 Score 0 6 - - 0  Difficult doing work/chores - - - - -  Some recent data might be hidden    phq 9 is negative   Fall Risk: Fall Risk  08/19/2021 07/08/2021 03/31/2021 02/25/2021 01/04/2021  Falls in the past year? 0 0 0 0 0  Number falls in past yr: 0 0 0 0 0  Injury with Fall? 0 0 0 0 0  Risk for fall due to : No Fall Risks No Fall Risks No Fall Risks - -  Follow up Falls prevention discussed Falls prevention discussed - Falls evaluation completed -      Functional Status Survey: Is the patient deaf or have difficulty hearing?: No Does the patient have difficulty seeing, even when wearing glasses/contacts?: No Does the patient have difficulty concentrating, remembering, or making decisions?: No Does  the patient have difficulty walking or climbing stairs?: Yes Does the patient have difficulty dressing or bathing?: No Does the patient have difficulty doing errands alone such as visiting a doctor's office or shopping?: No    Assessment & Plan  1. Syncope and collapse  - Ambulatory referral to Neurology - Ambulatory referral to Cardiology   Stop Wellbutrin Resume statin therapy  Advised to be aware of warnings signs, avoid driving if possible until evaluated, lay down immediately

## 2021-08-19 ENCOUNTER — Ambulatory Visit (INDEPENDENT_AMBULATORY_CARE_PROVIDER_SITE_OTHER): Payer: Medicare Other | Admitting: Family Medicine

## 2021-08-19 ENCOUNTER — Encounter: Payer: Self-pay | Admitting: Family Medicine

## 2021-08-19 ENCOUNTER — Other Ambulatory Visit: Payer: Self-pay

## 2021-08-19 VITALS — BP 130/72 | HR 89 | Temp 97.9°F | Resp 18 | Ht 63.0 in | Wt 227.6 lb

## 2021-08-19 DIAGNOSIS — R55 Syncope and collapse: Secondary | ICD-10-CM

## 2021-08-22 ENCOUNTER — Other Ambulatory Visit: Payer: Self-pay

## 2021-08-22 ENCOUNTER — Ambulatory Visit (INDEPENDENT_AMBULATORY_CARE_PROVIDER_SITE_OTHER): Payer: Medicare Other | Admitting: Urology

## 2021-08-22 DIAGNOSIS — N3946 Mixed incontinence: Secondary | ICD-10-CM

## 2021-08-22 NOTE — Progress Notes (Signed)
Patient ID: Mary Todd, female   DOB: Jan 10, 1946, 75 y.o.   MRN: [id number] PTNS  Session # 26 of 45  Health & Social Factors: no change Caffeine: 1 Alcohol: 0 Daytime voids #per day: 3-4 Night-time voids #per night: 1-2 Urgency: mild Incontinence Episodes #per day: 0 Ankle used: right Treatment Setting: 5 Feeling/ Response: both Comments: pt tolerated well  Performed By: Edwin Dada, CMA  Follow Up: 1 mth for #27 of 45

## 2021-08-24 IMAGING — CT CT CHEST-ABD-PELV W/ CM
2 of 3 series · 11 of 31 positions shown, 17 images · IV contrast (omnipaque)
Comparison: Chest CT November 11, 2015 and PET-CT January 04, 2007.

CLINICAL DATA: Night sweats, fatigue following hip history of
Hodgkin's lymphoma and breast cancer. Right breast cancer with
radiation therapy and lumpectomy and 1155. Hodgkin's lymphoma 0555.
received ABVD times 5 through cycle 3-A and then AVD without the
Bleomycin times 7 subsequent cycles. Treatment completed in [DATE]

EXAM:
CT CHEST, ABDOMEN, AND PELVIS WITH CONTRAST
TECHNIQUE: Multidetector CT imaging of the chest, abdomen and pelvis was
performed following the standard protocol during bolus
administration of intravenous contrast.
CONTRAST:  100mL OMNIPAQUE IOHEXOL 300 MG/ML  SOLN

[Series 2: cap with · axial · 0.78mm/px · z∈[-957,-472]mm · 9 of 125 slices shown, 15 images]
[im 14/125  mediastinal]
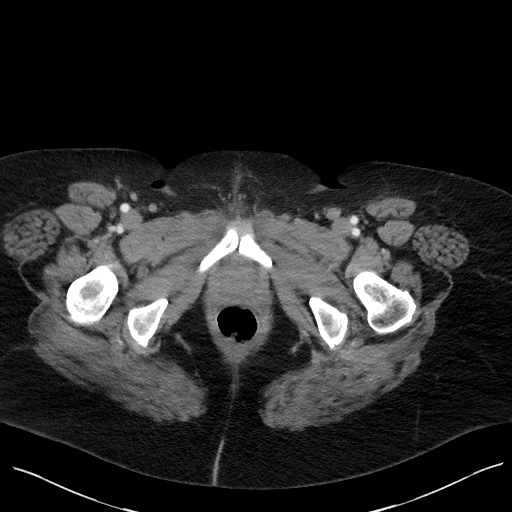
[im 14/125  bone]
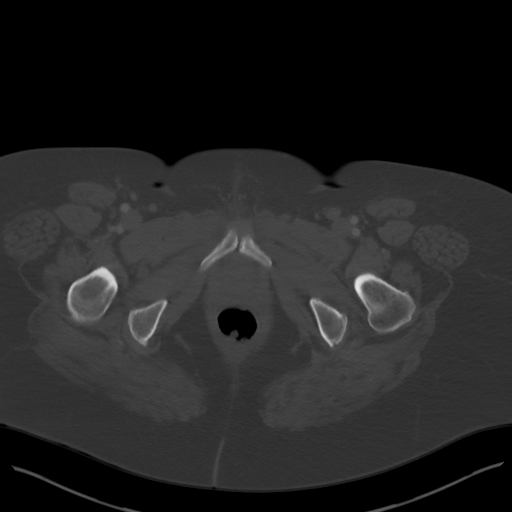
[im 28/125  mediastinal]
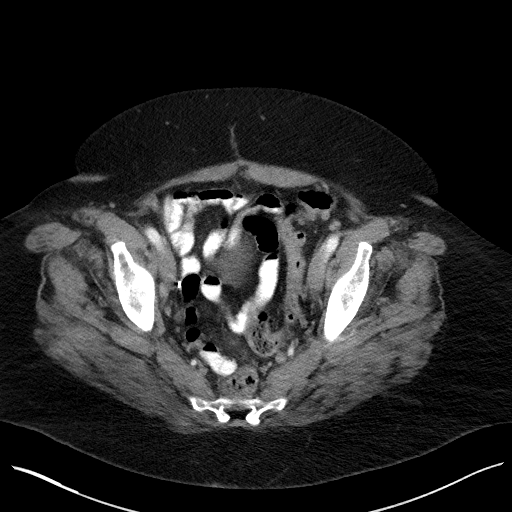
[im 42/125  mediastinal]
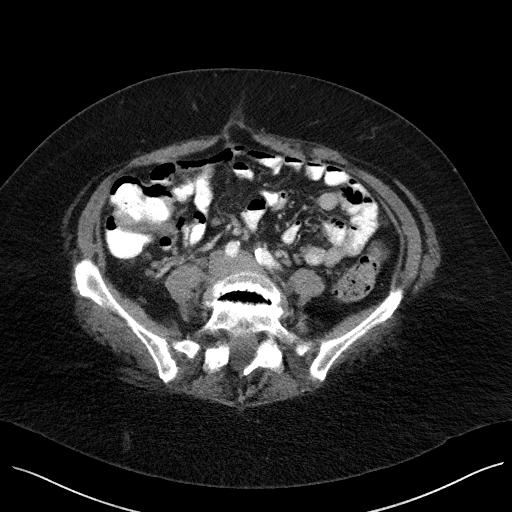
[im 56/125  mediastinal]
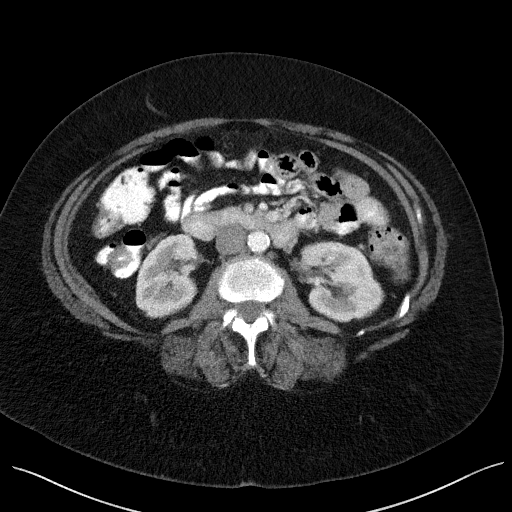
[im 61/125  mediastinal]
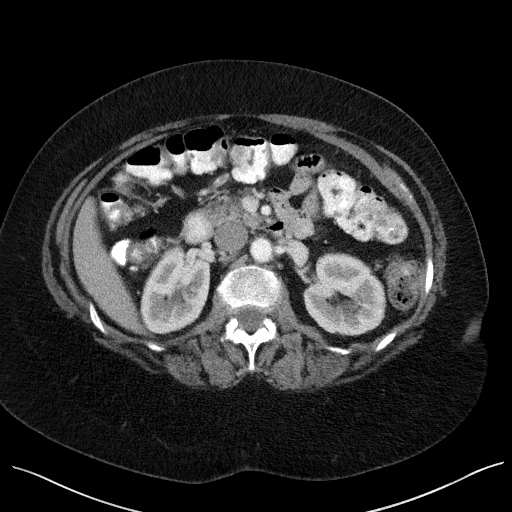
[im 69/125  mediastinal]
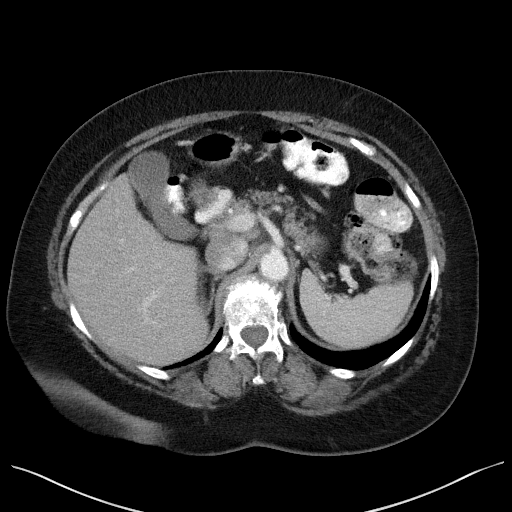
[im 69/125  lung]
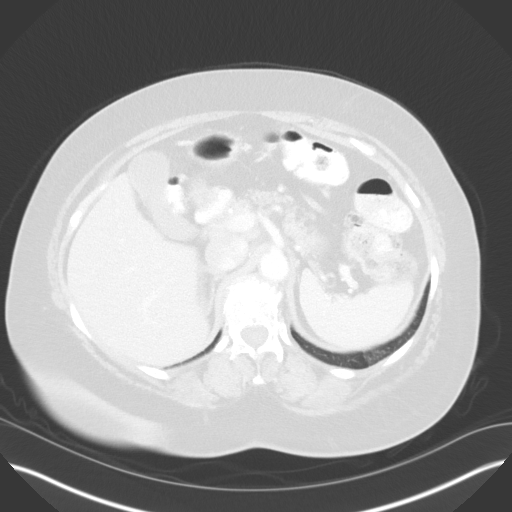
[im 83/125  mediastinal]
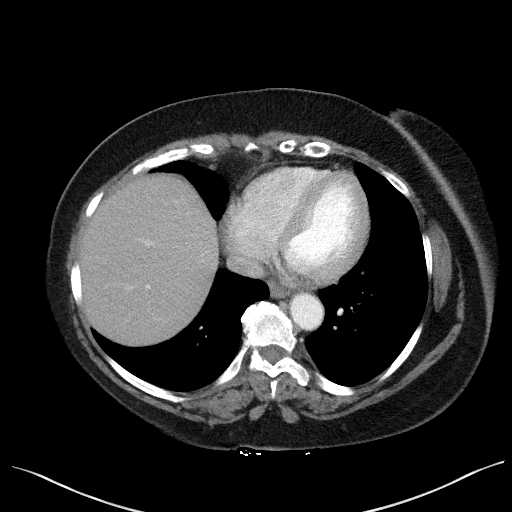
[im 83/125  lung]
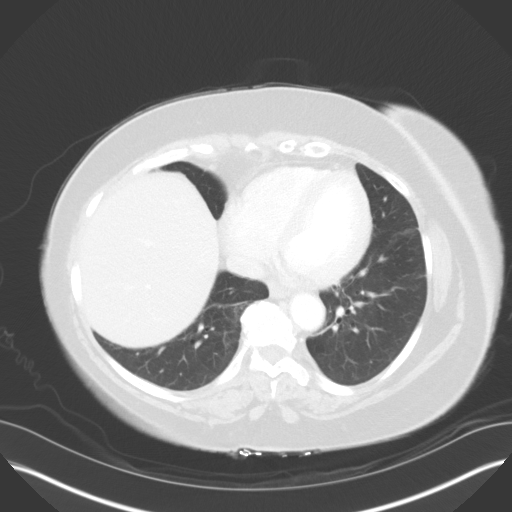
[im 97/125  mediastinal]
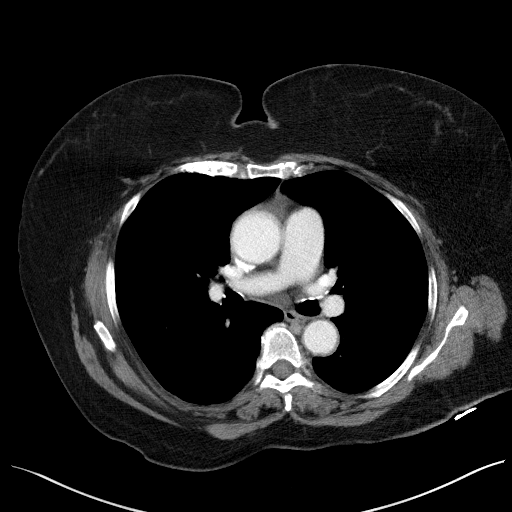
[im 97/125  lung]
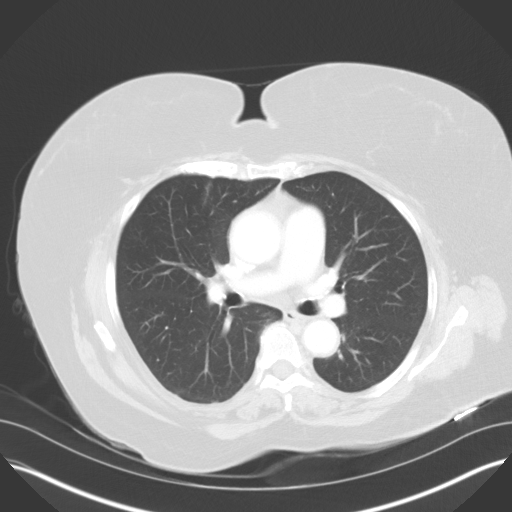
[im 111/125  mediastinal]
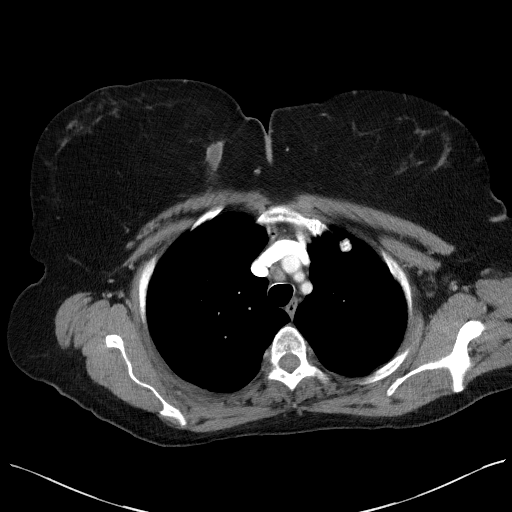
[im 111/125  lung]
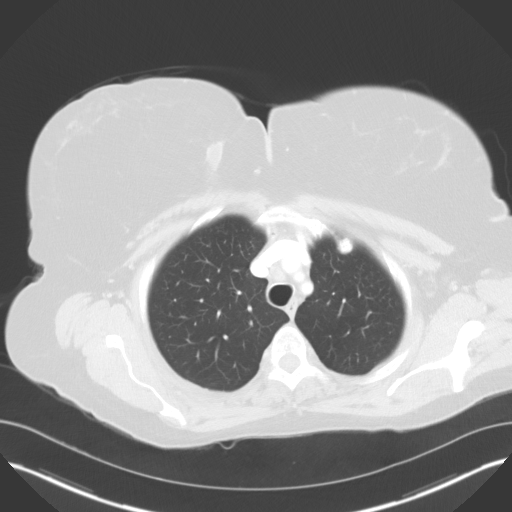
[im 111/125  bone]
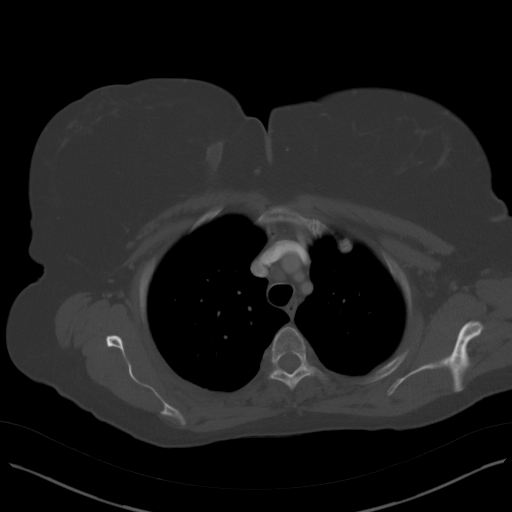

[Series 4: lung · axial · 0.78mm/px · z∈[-696,-648]mm · 2 of 160 slices shown]
[im 13/160  bone]
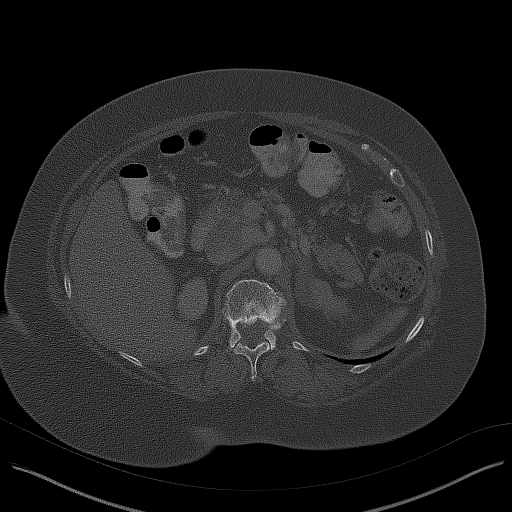
[im 37/160  bone]
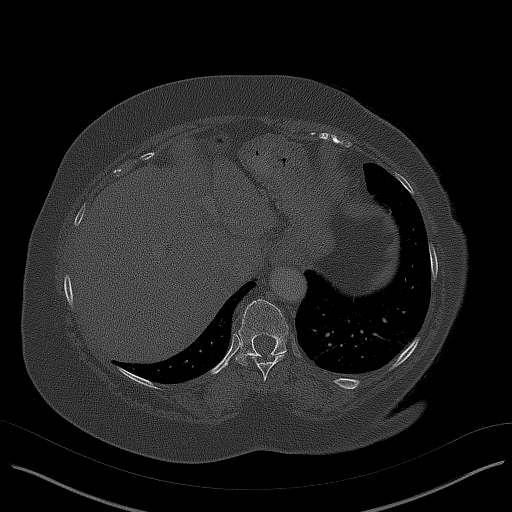

[11 of 31 positions shown; findings below may reference images not displayed]

FINDINGS: CT CHEST FINDINGS

Cardiovascular: Scattered aortic atherosclerosis without aneurysmal
dilation. No central pulmonary embolus. Three-vessel coronary artery
disease. Normal size heart. No significant pericardial
effusion/thickening.

Mediastinum/Nodes: No supraclavicular adenopathy. Left-sided thyroid
nodules with the dominant nodule measuring 1.6 cm on image [DATE].
Prominent mediastinal and left axillary lymph nodes measuring up to
9 mm at the paratracheal station on image [DATE] and 7 mm in the left
axilla, unchanged dating back to at least 9965. No pathologically
enlarged mediastinal, hilar or axillary lymph nodes.

Lungs/Pleura: Mild biapical pleuroparenchymal scarring. Linear
scarring in the lung bases, right middle lobe and lingula. No
suspicious pulmonary nodules or masses. No focal consolidations. No
pleural effusion. No pneumothorax.

Musculoskeletal: No suspicious chest wall mass. No aggressive lytic
or blastic lesion of bone. Thoracic spondylosis.

CT ABDOMEN PELVIS FINDINGS

Hepatobiliary: Mild hepatomegaly measuring 18.4 cm in craniocaudal
dimension at the midclavicular line. Mild diffuse hepatic steatosis.
No suspicious hepatic lesion. Gallbladder is unremarkable. No
biliary ductal dilatation.

Pancreas: Fatty atrophy of the pancreatic head. No pancreatic ductal
dilation.

Spleen: Normal in size without focal abnormality.

Adrenals/Urinary Tract: Left adrenal thickening without discrete
nodularity, favor hyperplasia. Right adrenal glands unremarkable. No
hydronephrosis. Symmetric enhancement excretion of contrast in the
bilateral kidneys. Bilateral subcentimeter hypodense renal lesions
measuring up to 6 mm in the interpolar region of the left kidney on
image [DATE], technically too small to accurately characterize but
statistically most likely representing renal cysts. No solid
enhancing renal lesions. Urinary bladder is grossly unremarkable for
degree of distension.

Stomach/Bowel: Radiopaque enteric contrast traverses the splenic
flexure. Stomach is predominantly decompressed limiting evaluation.
Normal positioning of the duodenum/ligament of [place]. No pathologic
dilation of small bowel. The appendix and terminal ileum appear
grossly unremarkable. Small volume of formed stool throughout the
colon. Left-sided colonic diverticulosis without findings of acute
diverticulitis.

Vascular/Lymphatic: Aortic atherosclerosis without aneurysmal
dilation. Prominent periportal lymph nodes measuring up to 6-7 mm
similar to CT November 11, 2015. No pathologically enlarged abdominal
or pelvic lymph nodes.

Reproductive: Status post hysterectomy. No suspicious adnexal
masses.

Other: No abdominopelvic ascites.

Musculoskeletal: Multilevel degenerative changes spine. Grade 1
degenerative L5 on S1 anterolisthesis. No aggressive lytic or
blastic lesion of bone.
IMPRESSION: 1. No pathologically enlarged abdominal or pelvic lymph nodes and no
evidence of metastatic disease within the chest, abdomen, or pelvis.
2. Left-sided thyroid nodules with the dominant nodule measuring
cm. Recommend thyroid US (ref: [HOSPITAL]. [DATE]):
3. Mild hepatomegaly with diffuse hepatic steatosis.
4. Left-sided colonic diverticulosis without findings of acute
diverticulitis.
5.  Aortic Atherosclerosis ([id number]).

## 2021-08-29 ENCOUNTER — Other Ambulatory Visit: Payer: Self-pay

## 2021-08-29 ENCOUNTER — Ambulatory Visit (INDEPENDENT_AMBULATORY_CARE_PROVIDER_SITE_OTHER): Payer: Medicare Other | Admitting: Cardiology

## 2021-08-29 ENCOUNTER — Encounter: Payer: Self-pay | Admitting: Cardiology

## 2021-08-29 ENCOUNTER — Ambulatory Visit (INDEPENDENT_AMBULATORY_CARE_PROVIDER_SITE_OTHER): Payer: Medicare Other

## 2021-08-29 VITALS — BP 145/93 | HR 88 | Ht 64.0 in | Wt 230.0 lb

## 2021-08-29 DIAGNOSIS — R55 Syncope and collapse: Secondary | ICD-10-CM

## 2021-08-29 DIAGNOSIS — I10 Essential (primary) hypertension: Secondary | ICD-10-CM

## 2021-08-29 NOTE — Patient Instructions (Signed)
Medication Instructions:   Your physician recommends that you continue on your current medications as directed. Please refer to the Current Medication list given to you today.  *If you need a refill on your cardiac medications before your next appointment, please call your pharmacy*   Lab Work:  None Ordered  If you have labs (blood work) drawn today and your tests are completely normal, you will receive your results only by: [place] (if you have MyChart) OR A paper copy in the mail If you have any lab test that is abnormal or we need to change your treatment, we will call you to review the results.   Testing/Procedures:  Echocardiogram  Your physician has requested that you have an echocardiogram. Echocardiography is a painless test that uses sound waves to create images of your heart. It provides your doctor with information about the size and shape of your heart and how well your heart's chambers and valves are working. This procedure takes approximately one hour. There are no restrictions for this procedure. Please note; depending on visual quality an IV may need to be placed.   2. Your physician has recommended that you wear a Zio monitor.   This monitor is a medical device that records the heart's electrical activity. Doctors most often use these monitors to diagnose arrhythmias. Arrhythmias are problems with the speed or rhythm of the heartbeat. The monitor is a small device applied to your chest. You can wear one while you do your normal daily activities. While wearing this monitor if you have any symptoms to push the button and record what you felt. Once you have worn this monitor for the period of time provider prescribed (Usually 14 days), you will return the monitor device in the postage paid box. Once it is returned they will download the data collected and provide [place] with a report which the provider will then review and we will call you with those results. Important  tips:  Avoid showering during the first 24 hours of wearing the monitor. Avoid excessive sweating to help maximize wear time. Do not submerge the device, no hot tubs, and no swimming pools. Keep any lotions or oils away from the patch. After 24 hours you may shower with the patch on. Take brief showers with your back facing the shower head.  Do not remove patch once it has been placed because that will interrupt data and decrease adhesive wear time. Push the button when you have any symptoms and write down what you were feeling. Once you have completed wearing your monitor, remove and place into box which has postage paid and place in your outgoing mailbox.  If for some reason you have misplaced your box then call our office and we can provide another box and/or mail it off for you.     Follow-Up: At [facility], you and your health needs are our priority.  As part of our continuing mission to provide you with exceptional heart care, we have created designated Provider Care Teams.  These Care Teams include your primary Cardiologist (physician) and Advanced Practice Providers (APPs -  Physician Assistants and Nurse Practitioners) who all work together to provide you with the care you need, when you need it.  We recommend signing up for the patient portal called "MyChart".  Sign up information is provided on this After Visit Summary.  MyChart is used to connect with patients for Virtual Visits (Telemedicine).  Patients are able to view lab/test results, encounter notes, upcoming  appointments, etc.  Non-urgent messages can be sent to your provider as well.   To learn more about what you can do with MyChart, go to [email or web address].    Your next appointment:   After testing  The format for your next appointment:   In Person  Provider:   You may see Dr. Garen Lah or one of the following Advanced Practice Providers on your designated Care Team:   Murray Hodgkins, NP Christell Faith,  PA-C Marrianne Mood, PA-C Cadence Bow[place], [place]

## 2021-08-29 NOTE — Progress Notes (Signed)
Cardiology Office Note:    Date:  08/29/2021   ID:  Mary Todd, DOB [birth year], MRN [id number]  PCP:  Steele Sizer, MD   [place] Providers Cardiologist:  Kate Sable, MD     Referring MD: Steele Sizer, MD   Chief Complaint  Patient presents with   Other    Syncope. Meds reviewed verbally with pt.    History of Present Illness:    Mary Todd is a 75 y.o. female with a hx of hypertension, who presents due to syncope.  Patient states passing out couple of weeks ago while at the [facility] state fair.  She was sitting when she suddenly felt dizzy, blurry vision and unwell.  She told her husband that she feels sick, prior to passing out.  She lost consciousness for couple of minutes, was taken to a nearby ED at [facility].  Work-up was unrevealing.  States passing out a couple of weeks earlier while she was sitting in the ED with her grandson.  Her grandson had attempted suicide.  While in the ED CAD, she felt dizzy, prior to passing out.  She had passed out couple of times before, work-up was unrevealing.  She thinks her symptoms of all being associated with stress.  Did not have much sleep night before the state fair.  Felt anxious and tired during the fair.  She denies palpitations, chest pain, shortness of breath.  Denies any history of heart disease.  She takes cholesterol for risk stratification.  Denies having elevated lipid levels.  Past Medical History:  Diagnosis Date   Allergy    Arthritis    knees, lower back   Cancer [facility])    Esophageal reflux    Hypertension 2006   Osteoporosis    Personal history of malignant neoplasm of breast 2011   right breast   Personal history of radiation therapy 2011   RIGHT lumpectomy w/ radiation    Past Surgical History:  Procedure Laterality Date   ABDOMINAL HYSTERECTOMY     BACK SURGERY     BREAST BIOPSY Right 2011   radiation   BREAST LUMPECTOMY Right 2011   w/ radiation    BREAST SURGERY Right 2011   right breast lumpectomy,L/SN/R for right breast CA    CATARACT EXTRACTION W/PHACO Left 06/10/2018   Procedure: CATARACT EXTRACTION PHACO AND INTRAOCULAR LENS PLACEMENT ([place]) LEFT;  Surgeon: Eulogio Bear, MD;  Location: [place];  Service: Ophthalmology;  Laterality: Left;  Diabetic - oral meds   CATARACT EXTRACTION W/PHACO Right 07/15/2018   Procedure: CATARACT EXTRACTION PHACO AND INTRAOCULAR LENS PLACEMENT ([place]) RIGHT;  Surgeon: Eulogio Bear, MD;  Location: [place];  Service: Ophthalmology;  Laterality: Right;   COLONOSCOPY  2003   PORT A CATH REVISION     REPLACEMENT TOTAL KNEE Right 10/03/2019   DR. Harlow Mares    WRIST SURGERY  2005    Current Medications: Current Meds  Medication Sig   acetaminophen (TYLENOL) 650 MG CR tablet Take 1,300 mg by mouth every 8 (eight) hours as needed for pain.   amLODipine-valsartan (EXFORGE) 5-160 MG tablet Take 1 tablet by mouth daily.   aspirin 81 MG tablet Take 81 mg by mouth daily.   atorvastatin (LIPITOR) 10 MG tablet Take 1 tablet (10 mg total) by mouth daily.   CALCIUM-MAGNESIUM PO Take 1 tablet by mouth daily.   cetirizine (ZYRTEC) 10 MG tablet Take 10 mg by mouth daily.   Cholecalciferol (VITAMIN D-3 PO) Take  1 tablet by mouth daily.   denosumab (PROLIA) 60 MG/ML SOSY injection Inject 60 mg into the skin every 6 (six) months.   gabapentin (NEURONTIN) 100 MG capsule Take 1 capsule (100 mg total) by mouth at bedtime.   meloxicam (MOBIC) 15 MG tablet Take 1 tablet (15 mg total) by mouth daily.   metoprolol succinate (TOPROL-XL) 50 MG 24 hr tablet Take 1 tablet (50 mg total) by mouth daily. Take with or immediately following a meal.   Multiple Vitamin (MULTIVITAMIN) tablet Take 1 tablet by mouth daily.   NYSTATIN powder APPLY  POWDER TOPICALLY THREE TIMES DAILY   pantoprazole (PROTONIX) 40 MG tablet Take 1 tablet (40 mg total) by mouth daily.   tolterodine (DETROL LA) 4 MG 24 hr capsule  Take 1 capsule (4 mg total) by mouth daily.     Allergies:   Shrimp [shellfish allergy] and Augmentin [amoxicillin-pot clavulanate]   Social History   Socioeconomic History   Marital status: Married    Spouse name: [name]   Number of children: 2   Years of education: some college   Highest education level: 12th grade  Occupational History   Occupation: Retired  Tobacco Use   Smoking status: Never   Smokeless tobacco: Never   Tobacco comments:    smoking cessation materials not required  Vaping Use   Vaping Use: Never used  Substance and Sexual Activity   Alcohol use: Yes    Alcohol/week: 2.0 standard drinks    Types: 2 Glasses of wine per week    Comment: occassionally   Drug use: No   Sexual activity: Not Currently  Other Topics Concern   Not on file  Social History Narrative   Not on file   Social Determinants of Health   Financial Resource Strain: Low Risk    Difficulty of Paying Living Expenses: Not hard at all  Food Insecurity: No Food Insecurity   Worried About Charity fundraiser in the Last Year: Never true   [place] in the Last Year: Never true  Transportation Needs: No Transportation Needs   Lack of Transportation (Medical): No   Lack of Transportation (Non-Medical): No  Physical Activity: Insufficiently Active   Days of Exercise per Week: 2 days   Minutes of Exercise per Session: 30 min  Stress: No Stress Concern Present   Feeling of Stress : Not at all  Social Connections: Moderately Integrated   Frequency of Communication with Friends and Family: More than three times a week   Frequency of Social Gatherings with Friends and Family: Three times a week   Attends Religious Services: More than 4 times per year   Active Member of Clubs or Organizations: No   Attends Archivist Meetings: Never   Marital Status: Married     Family History: The patient's family history includes Bone cancer in her father; Breast cancer in her cousin;  Breast cancer (age of onset: 13) in her mother; Cancer in her father; Colon cancer in her maternal grandmother; Stroke in her mother.  ROS:   Please see the history of present illness.     All other systems reviewed and are negative.  EKGs/Labs/Other Studies Reviewed:    The following studies were reviewed today:   EKG:  EKG is  ordered today.  The ekg ordered today demonstrates normal sinus rhythm, normal ECG  Recent Labs: 04/14/2021: ALT 30; Hemoglobin 12.5; Platelets 226; TSH 0.604 06/28/2021: BUN 20; Creatinine, Ser 0.69; Potassium 4.3; Sodium 136  Recent Lipid Panel    Component Value Date/Time   CHOL 195 08/11/2019 0000   TRIG 93 08/11/2019 0000   HDL 76 08/11/2019 0000   CHOLHDL 2.6 08/11/2019 0000   LDLCALC 100 (H) 08/11/2019 0000     Risk Assessment/Calculations:          Physical Exam:    VS:  BP (!) 145/93 (BP Location: Right Arm, Patient Position: Sitting, Cuff Size: Normal)   Pulse 88   Ht 5\' 4"  (1.626 m)   Wt 230 lb (104.3 kg)   SpO2 98%   BMI 39.48 kg/m     Wt Readings from Last 3 Encounters:  08/29/21 230 lb (104.3 kg)  08/19/21 227 lb 9.6 oz (103.2 kg)  07/08/21 229 lb (103.9 kg)     GEN:  Well nourished, well developed in no acute distress HEENT: Normal NECK: No JVD; No carotid bruits LYMPHATICS: No lymphadenopathy CARDIAC: RRR, no murmurs, rubs, gallops RESPIRATORY:  Clear to auscultation without rales, wheezing or rhonchi  ABDOMEN: Soft, non-tender, non-distended MUSCULOSKELETAL:  No edema; No deformity  SKIN: Warm and dry NEUROLOGIC:  Alert and oriented x 3 PSYCHIATRIC:  Normal affect   ASSESSMENT:    1. Syncope and collapse   2. Primary hypertension    PLAN:    In order of problems listed above:  Syncope.  Orthostatic vitals today with no evidence for orthostasis.  Patient's symptoms appear vasovagal in origin with prodromes of dizziness, blurry vision.  Patient's trigger for syncope appears to be stress/anxiety.  We will get  echocardiogram and obtain cardiac monitor to complete syncopal work-up.  Safety precautions, sitting when she develops prodromes advised. Hypertension, BP elevated, usually controlled.  Continue amlodipine, valsartan as prescribed.  Follow-up after echo and cardiac monitor.     Medication Adjustments/Labs and Tests Ordered: Current medicines are reviewed at length with the patient today.  Concerns regarding medicines are outlined above.  Orders Placed This Encounter  Procedures   LONG TERM MONITOR (3-14 DAYS)   EKG 12-Lead   ECHOCARDIOGRAM COMPLETE    No orders of the defined types were placed in this encounter.   Patient Instructions  Medication Instructions:   Your physician recommends that you continue on your current medications as directed. Please refer to the Current Medication list given to you today.  *If you need a refill on your cardiac medications before your next appointment, please call your pharmacy*   Lab Work:  None Ordered  If you have labs (blood work) drawn today and your tests are completely normal, you will receive your results only by: [place] (if you have MyChart) OR A paper copy in the mail If you have any lab test that is abnormal or we need to change your treatment, we will call you to review the results.   Testing/Procedures:  Echocardiogram  Your physician has requested that you have an echocardiogram. Echocardiography is a painless test that uses sound waves to create images of your heart. It provides your doctor with information about the size and shape of your heart and how well your heart's chambers and valves are working. This procedure takes approximately one hour. There are no restrictions for this procedure. Please note; depending on visual quality an IV may need to be placed.   2. Your physician has recommended that you wear a Zio monitor.   This monitor is a medical device that records the heart's electrical activity. Doctors  most often use these monitors to diagnose arrhythmias. Arrhythmias are problems  with the speed or rhythm of the heartbeat. The monitor is a small device applied to your chest. You can wear one while you do your normal daily activities. While wearing this monitor if you have any symptoms to push the button and record what you felt. Once you have worn this monitor for the period of time provider prescribed (Usually 14 days), you will return the monitor device in the postage paid box. Once it is returned they will download the data collected and provide [place] with a report which the provider will then review and we will call you with those results. Important tips:  Avoid showering during the first 24 hours of wearing the monitor. Avoid excessive sweating to help maximize wear time. Do not submerge the device, no hot tubs, and no swimming pools. Keep any lotions or oils away from the patch. After 24 hours you may shower with the patch on. Take brief showers with your back facing the shower head.  Do not remove patch once it has been placed because that will interrupt data and decrease adhesive wear time. Push the button when you have any symptoms and write down what you were feeling. Once you have completed wearing your monitor, remove and place into box which has postage paid and place in your outgoing mailbox.  If for some reason you have misplaced your box then call our office and we can provide another box and/or mail it off for you.     Follow-Up: At [facility], you and your health needs are our priority.  As part of our continuing mission to provide you with exceptional heart care, we have created designated Provider Care Teams.  These Care Teams include your primary Cardiologist (physician) and Advanced Practice Providers (APPs -  Physician Assistants and Nurse Practitioners) who all work together to provide you with the care you need, when you need it.  We recommend signing up for the patient  portal called "MyChart".  Sign up information is provided on this After Visit Summary.  MyChart is used to connect with patients for Virtual Visits (Telemedicine).  Patients are able to view lab/test results, encounter notes, upcoming appointments, etc.  Non-urgent messages can be sent to your provider as well.   To learn more about what you can do with MyChart, go to [email or web address].    Your next appointment:   After testing  The format for your next appointment:   In Person  Provider:   You may see Dr. Garen Lah or one of the following Advanced Practice Providers on your designated Care Team:   Murray Hodgkins, NP Christell Faith, PA-C Marrianne Mood, PA-C Cadence [place], [place]      Signed, Kate Sable, MD  08/29/2021 2:59 PM    [place]

## 2021-09-08 ENCOUNTER — Other Ambulatory Visit: Payer: Self-pay | Admitting: Family Medicine

## 2021-09-08 ENCOUNTER — Other Ambulatory Visit: Payer: Self-pay | Admitting: Podiatry

## 2021-09-08 DIAGNOSIS — K219 Gastro-esophageal reflux disease without esophagitis: Secondary | ICD-10-CM

## 2021-09-10 DIAGNOSIS — R55 Syncope and collapse: Secondary | ICD-10-CM

## 2021-09-14 DIAGNOSIS — R55 Syncope and collapse: Secondary | ICD-10-CM | POA: Diagnosis not present

## 2021-09-19 ENCOUNTER — Other Ambulatory Visit: Payer: Self-pay

## 2021-09-19 ENCOUNTER — Ambulatory Visit (INDEPENDENT_AMBULATORY_CARE_PROVIDER_SITE_OTHER): Payer: Medicare Other | Admitting: Urology

## 2021-09-19 DIAGNOSIS — N3946 Mixed incontinence: Secondary | ICD-10-CM

## 2021-09-19 NOTE — Progress Notes (Signed)
PTNS  Session # 27 of 45  Health & Social Factors: no change Caffeine: 1 Alcohol: 0-1 Daytime voids #per day: 3-5 Night-time voids #per night: 2 Urgency: strong Incontinence Episodes #per day: 0 Ankle used: right Treatment Setting: 5 Feeling/ Response: both Comments: patient tolerated well  Performed By: Fonnie Jarvis, CMA  Follow Up: 1 month

## 2021-09-27 DIAGNOSIS — F419 Anxiety disorder, unspecified: Secondary | ICD-10-CM | POA: Diagnosis not present

## 2021-09-27 DIAGNOSIS — R351 Nocturia: Secondary | ICD-10-CM | POA: Diagnosis not present

## 2021-09-27 DIAGNOSIS — F32A Depression, unspecified: Secondary | ICD-10-CM | POA: Diagnosis not present

## 2021-09-27 DIAGNOSIS — R55 Syncope and collapse: Secondary | ICD-10-CM | POA: Diagnosis not present

## 2021-09-28 ENCOUNTER — Other Ambulatory Visit: Payer: Self-pay | Admitting: Neurology

## 2021-09-28 DIAGNOSIS — R55 Syncope and collapse: Secondary | ICD-10-CM

## 2021-09-29 ENCOUNTER — Ambulatory Visit (INDEPENDENT_AMBULATORY_CARE_PROVIDER_SITE_OTHER): Payer: Medicare Other

## 2021-09-29 ENCOUNTER — Other Ambulatory Visit: Payer: Self-pay

## 2021-09-29 DIAGNOSIS — R55 Syncope and collapse: Secondary | ICD-10-CM | POA: Diagnosis not present

## 2021-09-29 LAB — ECHOCARDIOGRAM COMPLETE
AR max vel: 2.79 cm2
AV Area VTI: 2.57 cm2
AV Area mean vel: 2.54 cm2
AV Mean grad: 4 mmHg
AV Peak grad: 6.4 mmHg
Ao pk vel: 1.26 m/s
Area-P 1/2: 3.6 cm2
Calc EF: 52.9 %
MV M vel: 5.42 m/s
MV Peak grad: 117.5 mmHg
S' Lateral: 2.8 cm
Single Plane A2C EF: 52.9 %
Single Plane A4C EF: 55.9 %

## 2021-10-05 ENCOUNTER — Ambulatory Visit
Admission: RE | Admit: 2021-10-05 | Discharge: 2021-10-05 | Disposition: A | Discharge status: Home / Self Care | Payer: Medicare Other | Source: Ambulatory Visit | Attending: Neurology | Admitting: Neurology

## 2021-10-05 ENCOUNTER — Other Ambulatory Visit: Payer: Self-pay

## 2021-10-05 DIAGNOSIS — R55 Syncope and collapse: Secondary | ICD-10-CM | POA: Diagnosis not present

## 2021-10-05 DIAGNOSIS — G319 Degenerative disease of nervous system, unspecified: Secondary | ICD-10-CM | POA: Diagnosis not present

## 2021-10-06 ENCOUNTER — Ambulatory Visit (INDEPENDENT_AMBULATORY_CARE_PROVIDER_SITE_OTHER): Payer: Medicare Other | Admitting: Cardiology

## 2021-10-06 ENCOUNTER — Encounter: Payer: Self-pay | Admitting: Cardiology

## 2021-10-06 VITALS — BP 120/84 | HR 83 | Ht 63.0 in | Wt 228.0 lb

## 2021-10-06 DIAGNOSIS — R55 Syncope and collapse: Secondary | ICD-10-CM | POA: Diagnosis not present

## 2021-10-06 DIAGNOSIS — I10 Essential (primary) hypertension: Secondary | ICD-10-CM

## 2021-10-06 DIAGNOSIS — Z20822 Contact with and (suspected) exposure to covid-19: Secondary | ICD-10-CM | POA: Diagnosis not present

## 2021-10-06 NOTE — Patient Instructions (Signed)
Medication Instructions:  - Your physician recommends that you continue on your current medications as directed. Please refer to the Current Medication list given to you today.  *If you need a refill on your cardiac medications before your next appointment, please call your pharmacy*   Lab Work: - none ordered  If you have labs (blood work) drawn today and your tests are completely normal, you will receive your results only by: [place] (if you have MyChart) OR A paper copy in the mail If you have any lab test that is abnormal or we need to change your treatment, we will call you to review the results.   Testing/Procedures: - none ordered   Follow-Up: At [facility], you and your health needs are our priority.  As part of our continuing mission to provide you with exceptional heart care, we have created designated Provider Care Teams.  These Care Teams include your primary Cardiologist (physician) and Advanced Practice Providers (APPs -  Physician Assistants and Nurse Practitioners) who all work together to provide you with the care you need, when you need it.  We recommend signing up for the patient portal called "MyChart".  Sign up information is provided on this After Visit Summary.  MyChart is used to connect with patients for Virtual Visits (Telemedicine).  Patients are able to view lab/test results, encounter notes, upcoming appointments, etc.  Non-urgent messages can be sent to your provider as well.   To learn more about what you can do with MyChart, go to [email or web address].    Your next appointment:   As needed   The format for your next appointment:   In Person  Provider:   You may see Kate Sable, MD or one of the following Advanced Practice Providers on your designated Care Team:   Murray Hodgkins, NP Christell Faith, PA-C Cadence Kathlen [place], [place]    Other Instructions N/a

## 2021-10-06 NOTE — Progress Notes (Signed)
Cardiology Office Note:    Date:  10/06/2021   ID:  Mary Todd Monterey, DOB 02/26/1946, MRN [id number]  PCP:  Steele Sizer, MD   [facility] Providers Cardiologist:  Kate Sable, MD     Referring MD: Steele Sizer, MD   Chief Complaint  Patient presents with   Follow-up    F/U after cardiac testing    History of Present Illness:    Mary Todd is a 75 y.o. female with a hx of hypertension, who presents for follow-up.  She was last seen due to syncope.    Symptoms [place]  She was sitting and suddenly felt dizzy and unwell prior to passing out.  Had other episodes of passing out in the ED while with her grandson who attempted suicide.  Her symptoms appear vasovagal versus stress or anxiety induced.  Echocardiogram and cardiac monitor were performed to complete syncopal work-up and rule out any cardiac etiology.  Denies any history of heart disease.  Denies any further episodes of syncope.  Her grandson is doing much better.    Past Medical History:  Diagnosis Date   Allergy    Arthritis    knees, lower back   Cancer [facility])    Esophageal reflux    Hypertension 2006   Osteoporosis    Personal history of malignant neoplasm of breast 2011   right breast   Personal history of radiation therapy 2011   RIGHT lumpectomy w/ radiation    Past Surgical History:  Procedure Laterality Date   ABDOMINAL HYSTERECTOMY     BACK SURGERY     BREAST BIOPSY Right 2011   radiation   BREAST LUMPECTOMY Right 2011   w/ radiation   BREAST SURGERY Right 2011   right breast lumpectomy,L/SN/R for right breast CA    CATARACT EXTRACTION W/PHACO Left 06/10/2018   Procedure: CATARACT EXTRACTION PHACO AND INTRAOCULAR LENS PLACEMENT ([place]) LEFT;  Surgeon: Eulogio Bear, MD;  Location: [place];  Service: Ophthalmology;  Laterality: Left;  Diabetic - oral meds   CATARACT EXTRACTION W/PHACO Right 07/15/2018   Procedure: CATARACT  EXTRACTION PHACO AND INTRAOCULAR LENS PLACEMENT ([place]) RIGHT;  Surgeon: Eulogio Bear, MD;  Location: [place];  Service: Ophthalmology;  Laterality: Right;   COLONOSCOPY  2003   PORT A CATH REVISION     REPLACEMENT TOTAL KNEE Right 10/03/2019   DR. Harlow Mares    WRIST SURGERY  2005    Current Medications: Current Meds  Medication Sig   acetaminophen (TYLENOL) 650 MG CR tablet Take 1,300 mg by mouth every 8 (eight) hours as needed for pain.   amLODipine-valsartan (EXFORGE) 5-160 MG tablet Take 1 tablet by mouth daily.   aspirin 81 MG tablet Take 81 mg by mouth daily.   atorvastatin (LIPITOR) 10 MG tablet Take 1 tablet (10 mg total) by mouth daily.   CALCIUM-MAGNESIUM PO Take 1 tablet by mouth daily.   cetirizine (ZYRTEC) 10 MG tablet Take 10 mg by mouth daily.   Cholecalciferol (VITAMIN D-3 PO) Take 1 tablet by mouth daily.   denosumab (PROLIA) 60 MG/ML SOSY injection Inject 60 mg into the skin every 6 (six) months.   gabapentin (NEURONTIN) 100 MG capsule Take 1 capsule by mouth at bedtime   meloxicam (MOBIC) 15 MG tablet Take 1 tablet (15 mg total) by mouth daily.   metoprolol succinate (TOPROL-XL) 50 MG 24 hr tablet Take 1 tablet (50 mg total) by mouth daily. Take with or  immediately following a meal.   Multiple Vitamin (MULTIVITAMIN) tablet Take 1 tablet by mouth daily.   NYSTATIN powder APPLY  POWDER TOPICALLY THREE TIMES DAILY   pantoprazole (PROTONIX) 40 MG tablet Take 1 tablet by mouth once daily   sertraline (ZOLOFT) 25 MG tablet Take 25 mg by mouth every morning.   tolterodine (DETROL LA) 4 MG 24 hr capsule Take 1 capsule (4 mg total) by mouth daily.     Allergies:   Shrimp [shellfish allergy] and Augmentin [amoxicillin-pot clavulanate]   Social History   Socioeconomic History   Marital status: Married    Spouse name: [name]   Number of children: 2   Years of education: some college   Highest education level: 12th grade  Occupational History   Occupation:  Retired  Tobacco Use   Smoking status: Never   Smokeless tobacco: Never   Tobacco comments:    smoking cessation materials not required  Vaping Use   Vaping Use: Never used  Substance and Sexual Activity   Alcohol use: Yes    Alcohol/week: 2.0 standard drinks    Types: 2 Glasses of wine per week    Comment: occassionally   Drug use: No   Sexual activity: Not Currently  Other Topics Concern   Not on file  Social History Narrative   Not on file   Social Determinants of Health   Financial Resource Strain: Low Risk    Difficulty of Paying Living Expenses: Not hard at all  Food Insecurity: No Food Insecurity   Worried About Charity fundraiser in the Last Year: Never true   [place] in the Last Year: Never true  Transportation Needs: No Transportation Needs   Lack of Transportation (Medical): No   Lack of Transportation (Non-Medical): No  Physical Activity: Insufficiently Active   Days of Exercise per Week: 2 days   Minutes of Exercise per Session: 30 min  Stress: No Stress Concern Present   Feeling of Stress : Not at all  Social Connections: Moderately Integrated   Frequency of Communication with Friends and Family: More than three times a week   Frequency of Social Gatherings with Friends and Family: Three times a week   Attends Religious Services: More than 4 times per year   Active Member of Clubs or Organizations: No   Attends Archivist Meetings: Never   Marital Status: Married     Family History: The patient's family history includes Bone cancer in her father; Breast cancer in her cousin; Breast cancer (age of onset: 41) in her mother; Cancer in her father; Colon cancer in her maternal grandmother; Stroke in her mother.  ROS:   Please see the history of present illness.     All other systems reviewed and are negative.  EKGs/Labs/Other Studies Reviewed:    The following studies were reviewed today:   EKG:  EKG not ordered today.   Recent  Labs: 04/14/2021: ALT 30; Hemoglobin 12.5; Platelets 226; TSH 0.604 06/28/2021: BUN 20; Creatinine, Ser 0.69; Potassium 4.3; Sodium 136  Recent Lipid Panel    Component Value Date/Time   CHOL 195 08/11/2019 0000   TRIG 93 08/11/2019 0000   HDL 76 08/11/2019 0000   CHOLHDL 2.6 08/11/2019 0000   LDLCALC 100 (H) 08/11/2019 0000     Risk Assessment/Calculations:          Physical Exam:    VS:  BP 120/84 (BP Location: Left Arm, Patient Position: Sitting, Cuff Size: Large)  Pulse 83   Ht 5\' 3"  (1.6 m)   Wt 228 lb (103.4 kg)   SpO2 97%   BMI 40.39 kg/m     Wt Readings from Last 3 Encounters:  10/06/21 228 lb (103.4 kg)  08/29/21 230 lb (104.3 kg)  08/19/21 227 lb 9.6 oz (103.2 kg)     GEN:  Well nourished, well developed in no acute distress HEENT: Normal NECK: No JVD; No carotid bruits LYMPHATICS: No lymphadenopathy CARDIAC: RRR, no murmurs, rubs, gallops RESPIRATORY:  Clear to auscultation without rales, wheezing or rhonchi  ABDOMEN: Soft, non-tender, non-distended MUSCULOSKELETAL:  No edema; No deformity  SKIN: Warm and dry NEUROLOGIC:  Alert and oriented x 3 PSYCHIATRIC:  Normal affect   ASSESSMENT:    1. Syncope and collapse   2. Primary hypertension     PLAN:    In order of problems listed above:  Syncope.  Prior orthostatic vitals with no evidence for orthostasis.  Echo with EF 55 to 60%, no gross structural abnormalities, cardiac monitor with no significant arrhythmias to suggest etiology of syncope, occasional paroxysmal SVT.  Patient's symptoms appear vasovagal in origin with prodromes of dizziness, blurry vision.  Patient's trigger for syncope appears to be stress/anxiety.  Safety precautions, sitting when she develops prodromes again advised. Hypertension, BP controlled.  Continue amlodipine 5 mg, valsartan 160 mg  Follow-up as needed     Medication Adjustments/Labs and Tests Ordered: Current medicines are reviewed at length with the patient today.   Concerns regarding medicines are outlined above.  No orders of the defined types were placed in this encounter.   No orders of the defined types were placed in this encounter.   Patient Instructions  Medication Instructions:  - Your physician recommends that you continue on your current medications as directed. Please refer to the Current Medication list given to you today.  *If you need a refill on your cardiac medications before your next appointment, please call your pharmacy*   Lab Work: - none ordered  If you have labs (blood work) drawn today and your tests are completely normal, you will receive your results only by: [place] (if you have MyChart) OR A paper copy in the mail If you have any lab test that is abnormal or we need to change your treatment, we will call you to review the results.   Testing/Procedures: - none ordered   Follow-Up: At [facility], you and your health needs are our priority.  As part of our continuing mission to provide you with exceptional heart care, we have created designated Provider Care Teams.  These Care Teams include your primary Cardiologist (physician) and Advanced Practice Providers (APPs -  Physician Assistants and Nurse Practitioners) who all work together to provide you with the care you need, when you need it.  We recommend signing up for the patient portal called "MyChart".  Sign up information is provided on this After Visit Summary.  MyChart is used to connect with patients for Virtual Visits (Telemedicine).  Patients are able to view lab/test results, encounter notes, upcoming appointments, etc.  Non-urgent messages can be sent to your provider as well.   To learn more about what you can do with MyChart, go to [email or web address].    Your next appointment:   As needed   The format for your next appointment:   In Person  Provider:   You may see Kate Sable, MD or one of the following Advanced Practice  Providers on your designated Care Team:  Murray Hodgkins, NP Christell Faith, PA-C Cadence Kathlen Mody, [place]    Other Instructions N/a   Signed, Kate Sable, MD  10/06/2021 12:53 PM    [place]

## 2021-10-10 NOTE — Progress Notes (Signed)
Name: Mary Todd   MRN: [id number]    DOB: 03-05-1946   Date:10/11/2021       Progress Note  Subjective  Chief Complaint  Follow Up  HPI  Pre-diabetes: she denies polyphagia, polydipsia or polyuria, last A1C was 5.5%, and last visit was 5.4 %, she has been off Metformin   Syncopal Episode: two episodes in 2022, seen by cardiologist and work up negative - Dr. Mylo Red thought likely vasovagal episode. She also saw Dr. Manuella Ghazi - neurologist and worked up essentially normal and was advised sleep study and also started on zoloft low dose.    Urge incontinence: . She is under the care of Urologist, taking Detrol and seems to be improving symptoms, she is also having post tibial neuro stimulation needle electrode therapy monthly. She states nocturia down to once or twice per nigh, down from 3-4 times.   Left lateral foot pain: she saw a podiatrist and since she started gabapentin she is feeling well. No side effects of medication    HTN: she is taking  Exforge 5/160 and also metoprolol, tolerating medication well, no chest pain or palpitation. BP is at goal   Flatulence: she is no longer using a straw and only drinking after meals. Symptoms improved    GERD:she tried stopping Pantoprazole but as soon as she went off medication symptoms became severe, with heartburn  and regurgitation,. She is back on PPI daily but advised to try to wean by taking it every other day for weeks before going down further . Discussed risk of NSAI's use and GI bleed, she has not been able to stop Meloxicam yet    OA: she had TKR right knee on Dec 4 th 2020 by Dr. Harlow Mares , she completed  PT and is doing well, she denies any pain, normal extension but has a catch when flexing at times. She needs to take Meloxicam daily otherwise she feels very stiff, discussed importance of trying to take 7.5 mg . She also takes tylenol at night to control symptoms    History Right Breast Cancer/hodgkins lymphoma : under the care  of Dr. Mike Gip  she has been released from her surgeon. . Also diagnosed with hodgkin's lymphoma in 2004, breast cancer diagnosed in 2011  she stopped Arimidex Feb 2021 , up to date with follow ups and imaging   Atherosclerosis aorta/ischemic brain disease : on recent CT abdomen, last LDL was around 100, she is on low dose atorvastatin and we will adjust to 20 mg and recheck levels . MRI brain showed white matter ischemic brain disease. Goal LDL below 70   Fatty liver and hepatomegaly: seen by GI and was advised to try losing weight. Unchanged   Osteoporosis: diagnosed in 2018 with AP spine score of -3.1 . She gets Prolia at the cancer center and she is tolerating medications well. Unchanged    Thyroid nodules: incidental finding on CT neck , had biopsy by  Dr. Gabriel Carina Dec 2017  and biopsy was negative, she was last seen 05/2020 and has been released from her care . Denies dysphagia    Morbid obesity: BMI is over 40 now,  she has long history of obesity, she is off  Metformin and weight is slightly higher today   Dysthymia: she took Citalopram for years and weaned self off in 2022. She tried Wellbutrin and it improved her fatigue but we stopped due to syncopal episode and possibility of seizures, but she has be cleared by Dr. Manuella Ghazi, she  is now on Zoloft prescribed by him and she is trying to not worry about everyone anymore. Feeling better Discussed resuming Wellbutrin in addition to zoloft and she agrees   Patient Active Problem List   Diagnosis Date Noted   Total knee replacement status, right 10/03/2019   Encounter for screening mammogram for breast cancer 07/02/2018   Anterolisthesis 08/06/2017   DDD (degenerative disc disease), lumbosacral 08/06/2017   Chondromalacia patellae 07/03/2017   Osteoporosis 12/14/2016   Morbid obesity ([place]) 07/17/2016   Spinal stenosis in cervical region 05/22/2016   Thyroid nodule 05/22/2016   GERD without esophagitis 03/16/2016   Neck muscle spasm 03/16/2016    History of back surgery 03/16/2016   Osteoarthritis of both knees 03/16/2016   Large breasts 03/16/2016   Hypertension, benign 03/16/2016   Perennial allergic rhinitis with seasonal variation 03/16/2016   History of pneumococcal pneumonia 09/20/2015   Hodgkin disease ([place]) 07/15/2014   Breast cancer, right ([place]) 06/26/2013    Past Surgical History:  Procedure Laterality Date   ABDOMINAL HYSTERECTOMY     BACK SURGERY     BREAST BIOPSY Right 2011   radiation   BREAST LUMPECTOMY Right 2011   w/ radiation   BREAST SURGERY Right 2011   right breast lumpectomy,L/SN/R for right breast CA    CATARACT EXTRACTION W/PHACO Left 06/10/2018   Procedure: CATARACT EXTRACTION PHACO AND INTRAOCULAR LENS PLACEMENT ([place]) LEFT;  Surgeon: Eulogio Bear, MD;  Location: [place];  Service: Ophthalmology;  Laterality: Left;  Diabetic - oral meds   CATARACT EXTRACTION W/PHACO Right 07/15/2018   Procedure: CATARACT EXTRACTION PHACO AND INTRAOCULAR LENS PLACEMENT ([place]) RIGHT;  Surgeon: Eulogio Bear, MD;  Location: [place];  Service: Ophthalmology;  Laterality: Right;   COLONOSCOPY  2003   PORT A CATH REVISION     REPLACEMENT TOTAL KNEE Right 10/03/2019   DR. [place]  2005    Family History  Problem Relation Age of Onset   Cancer Father    Bone cancer Father    Breast cancer Mother 37   Stroke Mother    Colon cancer Maternal Grandmother    Breast cancer Cousin     Social History   Tobacco Use   Smoking status: Never   Smokeless tobacco: Never   Tobacco comments:    smoking cessation materials not required  Substance Use Topics   Alcohol use: Yes    Alcohol/week: 2.0 standard drinks    Types: 2 Glasses of wine per week    Comment: occassionally     Current Outpatient Medications:    acetaminophen (TYLENOL) 650 MG CR tablet, Take 1,300 mg by mouth every 8 (eight) hours as needed for pain., Disp: , Rfl:    amLODipine-valsartan (EXFORGE)  5-160 MG tablet, Take 1 tablet by mouth daily., Disp: 90 tablet, Rfl: 1   aspirin 81 MG tablet, Take 81 mg by mouth daily., Disp: , Rfl:    atorvastatin (LIPITOR) 10 MG tablet, Take 1 tablet (10 mg total) by mouth daily., Disp: 90 tablet, Rfl: 0   CALCIUM-MAGNESIUM PO, Take 1 tablet by mouth daily., Disp: , Rfl:    cetirizine (ZYRTEC) 10 MG tablet, Take 10 mg by mouth daily., Disp: , Rfl:    Cholecalciferol (VITAMIN D-3 PO), Take 1 tablet by mouth daily., Disp: , Rfl:    denosumab (PROLIA) 60 MG/ML SOSY injection, Inject 60 mg into the skin every 6 (six) months., Disp: , Rfl:    gabapentin (NEURONTIN) 100  MG capsule, Take 1 capsule by mouth at bedtime, Disp: 90 capsule, Rfl: 0   meloxicam (MOBIC) 15 MG tablet, Take 1 tablet (15 mg total) by mouth daily., Disp: 90 tablet, Rfl: 1   metoprolol succinate (TOPROL-XL) 50 MG 24 hr tablet, Take 1 tablet (50 mg total) by mouth daily. Take with or immediately following a meal., Disp: 90 tablet, Rfl: 1   Multiple Vitamin (MULTIVITAMIN) tablet, Take 1 tablet by mouth daily., Disp: , Rfl:    NYSTATIN powder, APPLY  POWDER TOPICALLY THREE TIMES DAILY, Disp: 60 g, Rfl: 0   pantoprazole (PROTONIX) 40 MG tablet, Take 1 tablet by mouth once daily, Disp: 90 tablet, Rfl: 0   sertraline (ZOLOFT) 25 MG tablet, Take 25 mg by mouth every morning., Disp: , Rfl:    tolterodine (DETROL LA) 4 MG 24 hr capsule, Take 1 capsule (4 mg total) by mouth daily., Disp: 90 capsule, Rfl: 3  Allergies  Allergen Reactions   Shrimp [Shellfish Allergy] Hives   Augmentin [Amoxicillin-Pot Clavulanate] Nausea Only    I personally reviewed active problem list, medication list, allergies, family history, social history, health maintenance with the patient/caregiver today.   ROS  Constitutional: Negative for fever or weight change.  Respiratory: Negative for cough and shortness of breath.   Cardiovascular: Negative for chest pain or palpitations.  Gastrointestinal: Negative for  abdominal pain, no bowel changes.  Musculoskeletal: Negative for gait problem or joint swelling.  Skin: Negative for rash.  Neurological: Negative for dizziness or headache.  No other specific complaints in a complete review of systems (except as listed in HPI above).   Objective  Vitals:   10/11/21 0947  BP: 132/70  Pulse: 88  Resp: 16  Temp: 98.2 F (36.8 C)  TempSrc: Oral  SpO2: 99%  Weight: 231 lb (104.8 kg)  Height: 5\' 3"  (1.6 m)    Body mass index is 40.92 kg/m.  Physical Exam  Constitutional: Patient appears well-developed and well-nourished. Obese  No distress.  HEENT: head atraumatic, normocephalic, pupils equal and reactive to light,neck supple Cardiovascular: Normal rate, regular rhythm and normal heart sounds.  No murmur heard. No BLE edema. Pulmonary/Chest: Effort normal and breath sounds normal. No respiratory distress. Abdominal: Soft.  There is no tenderness. Psychiatric: Patient has a normal mood and affect. behavior is normal. Judgment and thought content normal.   Recent Results (from the past 2160 hour(s))  ECHOCARDIOGRAM COMPLETE     Status: None   Collection Time: 09/29/21  9:08 AM  Result Value Ref Range   AR max vel 2.79 cm2   AV Peak grad 6.4 mmHg   Ao pk vel 1.26 m/s   S' Lateral 2.80 cm   Area-P 1/2 3.60 cm2   AV Area VTI 2.57 cm2   AV Mean grad 4.0 mmHg   Single Plane A4C EF 55.9 %   Single Plane A2C EF 52.9 %   Calc EF 52.9 %   AV Area mean vel 2.54 cm2   MV M vel 5.42 m/s   MV Peak grad 117.5 mmHg     PHQ2/9: Depression screen [facility] 2/9 10/11/2021 08/19/2021 07/08/2021 03/31/2021 02/25/2021  Decreased Interest 0 0 0 0 0  Down, Depressed, Hopeless 0 0 0 0 0  PHQ - 2 Score 0 0 0 0 0  Altered sleeping 0 0 0 - -  Tired, decreased energy 0 0 3 - -  Change in appetite 0 0 0 - -  Feeling bad or failure about yourself  0  0 3 - -  Trouble concentrating 0 0 0 - -  Moving slowly or fidgety/restless 0 0 0 - -  Suicidal thoughts 0 0 0 - -   PHQ-9 Score 0 0 6 - -  Difficult doing work/chores Not difficult at all - - - -  Some recent data might be hidden    phq 9 is negative   Fall Risk: Fall Risk  10/11/2021 08/19/2021 07/08/2021 03/31/2021 02/25/2021  Falls in the past year? 0 0 0 0 0  Number falls in past yr: 0 0 0 0 0  Injury with Fall? 0 0 0 0 0  Risk for fall due to : No Fall Risks No Fall Risks No Fall Risks No Fall Risks -  Follow up Falls prevention discussed Falls prevention discussed Falls prevention discussed - Falls evaluation completed      Functional Status Survey: Is the patient deaf or have difficulty hearing?: No Does the patient have difficulty seeing, even when wearing glasses/contacts?: No Does the patient have difficulty concentrating, remembering, or making decisions?: No Does the patient have difficulty walking or climbing stairs?: Yes Does the patient have difficulty dressing or bathing?: No Does the patient have difficulty doing errands alone such as visiting a doctor's office or shopping?: No    Assessment & Plan  1. Ischemic demyelination of brain (HCC)  - atorvastatin (LIPITOR) 20 MG tablet; Take 1 tablet (20 mg total) by mouth daily.  Dispense: 90 tablet; Refill: 0 - Lipid panel  2. Atherosclerosis of aorta (HCC)  - atorvastatin (LIPITOR) 20 MG tablet; Take 1 tablet (20 mg total) by mouth daily.  Dispense: 90 tablet; Refill: 0 - Lipid panel  3. GERD without esophagitis  - pantoprazole (PROTONIX) 40 MG tablet; Take 1 tablet (40 mg total) by mouth daily.  Dispense: 90 tablet; Refill: 0  4. Pre-diabetes   5. Age-related osteoporosis without current pathological fracture   6. Hodgkin's disease, stage IIA ([place])   7. Morbid obesity ([place])  Discussed with the patient the risk posed by an increased BMI. Discussed importance of portion control, calorie counting and at least 150 minutes of physical activity weekly. Avoid sweet beverages and drink more water. Eat at least 6 servings of  fruit and vegetables daily    8. Hodgkin lymphoma of intra-abdominal lymph nodes, unspecified Hodgkin lymphoma type ([place])   9. Essential hypertension  - COMPLETE METABOLIC PANEL WITH GFR - CBC with Differential/Platelet  10. Syncope and collapse   11. Hepatic steatosis   12. Dysthymia  - buPROPion (WELLBUTRIN XL) 150 MG 24 hr tablet; Take 1 tablet (150 mg total) by mouth daily.  Dispense: 90 tablet; Refill: 1  13. Thyroid nodule

## 2021-10-11 ENCOUNTER — Encounter: Payer: Self-pay | Admitting: Family Medicine

## 2021-10-11 ENCOUNTER — Ambulatory Visit (INDEPENDENT_AMBULATORY_CARE_PROVIDER_SITE_OTHER): Payer: Medicare Other | Admitting: Family Medicine

## 2021-10-11 VITALS — BP 132/70 | HR 88 | Temp 98.2°F | Resp 16 | Ht 63.0 in | Wt 231.0 lb

## 2021-10-11 DIAGNOSIS — I10 Essential (primary) hypertension: Secondary | ICD-10-CM | POA: Diagnosis not present

## 2021-10-11 DIAGNOSIS — R7303 Prediabetes: Secondary | ICD-10-CM | POA: Diagnosis not present

## 2021-10-11 DIAGNOSIS — I700 Atherosclerosis of aorta: Secondary | ICD-10-CM | POA: Diagnosis not present

## 2021-10-11 DIAGNOSIS — K760 Fatty (change of) liver, not elsewhere classified: Secondary | ICD-10-CM

## 2021-10-11 DIAGNOSIS — R55 Syncope and collapse: Secondary | ICD-10-CM

## 2021-10-11 DIAGNOSIS — K219 Gastro-esophageal reflux disease without esophagitis: Secondary | ICD-10-CM | POA: Diagnosis not present

## 2021-10-11 DIAGNOSIS — C8190 Hodgkin lymphoma, unspecified, unspecified site: Secondary | ICD-10-CM

## 2021-10-11 DIAGNOSIS — G378 Other specified demyelinating diseases of central nervous system: Secondary | ICD-10-CM | POA: Diagnosis not present

## 2021-10-11 DIAGNOSIS — M810 Age-related osteoporosis without current pathological fracture: Secondary | ICD-10-CM

## 2021-10-11 DIAGNOSIS — F341 Dysthymic disorder: Secondary | ICD-10-CM | POA: Diagnosis not present

## 2021-10-11 DIAGNOSIS — E041 Nontoxic single thyroid nodule: Secondary | ICD-10-CM

## 2021-10-11 DIAGNOSIS — C8193 Hodgkin lymphoma, unspecified, intra-abdominal lymph nodes: Secondary | ICD-10-CM | POA: Diagnosis not present

## 2021-10-11 DIAGNOSIS — I6782 Cerebral ischemia: Secondary | ICD-10-CM

## 2021-10-11 MED ORDER — ATORVASTATIN CALCIUM 20 MG PO TABS: 20.0000 mg | ORAL_TABLET | Freq: Every day | ORAL | 0 refills | Status: DC

## 2021-10-11 MED ORDER — BUPROPION HCL ER (XL) 150 MG PO TB24: 150.0000 mg | ORAL_TABLET | Freq: Every day | ORAL | 1 refills | Status: DC

## 2021-10-11 MED ORDER — PANTOPRAZOLE SODIUM 40 MG PO TBEC: 40.0000 mg | DELAYED_RELEASE_TABLET | Freq: Every day | ORAL | 0 refills | Status: DC

## 2021-10-11 NOTE — Patient Instructions (Signed)
Try to take half dose of meloxicam or skip daty Try to take pantoprazole every other day  Resuming wellbutrin for energy  Increased dose of atorvastatin from 10 mg to 20 mg daily

## 2021-10-17 ENCOUNTER — Other Ambulatory Visit: Payer: Self-pay

## 2021-10-17 ENCOUNTER — Encounter: Payer: Self-pay | Admitting: Urology

## 2021-10-17 ENCOUNTER — Ambulatory Visit (INDEPENDENT_AMBULATORY_CARE_PROVIDER_SITE_OTHER): Payer: Medicare Other | Admitting: Urology

## 2021-10-17 DIAGNOSIS — N3946 Mixed incontinence: Secondary | ICD-10-CM

## 2021-10-17 NOTE — Progress Notes (Signed)
Patient ID: Mary Todd, female   DOB: Apr 09, 1946, 75 y.o.   MRN: [id number] PTNS  Session # 28 of 45  Health & Social Factors: no change Caffeine: 2 Alcohol: 0 Daytime voids #per day: 3-4 Night-time voids #per night: 1-2 Urgency: mild Incontinence Episodes #per day: 0 Ankle used: right Treatment Setting: 4 Feeling/ Response: sensory Comments: pt tolerated well  Performed By: Edwin Dada, CMA  Follow Up: 45 of 31 in 37mth

## 2021-11-01 ENCOUNTER — Other Ambulatory Visit: Payer: Self-pay | Admitting: Family Medicine

## 2021-11-01 DIAGNOSIS — I700 Atherosclerosis of aorta: Secondary | ICD-10-CM

## 2021-11-03 ENCOUNTER — Ambulatory Visit (INDEPENDENT_AMBULATORY_CARE_PROVIDER_SITE_OTHER): Payer: Medicare Other

## 2021-11-03 DIAGNOSIS — I700 Atherosclerosis of aorta: Secondary | ICD-10-CM

## 2021-11-03 DIAGNOSIS — I10 Essential (primary) hypertension: Secondary | ICD-10-CM

## 2021-11-03 DIAGNOSIS — N3946 Mixed incontinence: Secondary | ICD-10-CM

## 2021-11-03 NOTE — Chronic Care Management (AMB) (Signed)
Chronic Care Management   CCM RN Visit Note  11/03/2021 Name: Mary Todd MRN: [id number] DOB: [birth year]  Subjective: Mary Todd is a 76 y.o. year old female who is a primary care patient of Steele Sizer, MD. The care management team was consulted for assistance with disease management and care coordination needs.    Engaged with patient by telephone for follow up visit in response to provider referral for case management and care coordination services.   Consent to Services:  The patient was given information about Chronic Care Management services, agreed to services, and gave verbal consent prior to initiation of services.  Please see initial visit note for detailed documentation.    Assessment: Review of patient past medical history, allergies, medications, health status, including review of consultants reports, laboratory and other test data, was performed as part of comprehensive evaluation and provision of chronic care management services.   SDOH (Social Determinants of Health) assessments and interventions performed: No  CCM Care Plan  Allergies  Allergen Reactions   Shrimp [Shellfish Allergy] Hives   Augmentin [Amoxicillin-Pot Clavulanate] Nausea Only    Outpatient Encounter Medications as of 11/03/2021  Medication Sig   acetaminophen (TYLENOL) 650 MG CR tablet Take 1,300 mg by mouth every 8 (eight) hours as needed for pain.   amLODipine-valsartan (EXFORGE) 5-160 MG tablet Take 1 tablet by mouth daily.   aspirin 81 MG tablet Take 81 mg by mouth daily.   atorvastatin (LIPITOR) 20 MG tablet Take 1 tablet (20 mg total) by mouth daily.   buPROPion (WELLBUTRIN XL) 150 MG 24 hr tablet Take 1 tablet (150 mg total) by mouth daily.   CALCIUM-MAGNESIUM PO Take 1 tablet by mouth daily.   cetirizine (ZYRTEC) 10 MG tablet Take 10 mg by mouth daily.   Cholecalciferol (VITAMIN D-3 PO) Take 1 tablet by mouth daily.   denosumab (PROLIA) 60 MG/ML SOSY injection  Inject 60 mg into the skin every 6 (six) months.   gabapentin (NEURONTIN) 100 MG capsule Take 1 capsule by mouth at bedtime   meloxicam (MOBIC) 15 MG tablet Take 1 tablet (15 mg total) by mouth daily.   metoprolol succinate (TOPROL-XL) 50 MG 24 hr tablet Take 1 tablet (50 mg total) by mouth daily. Take with or immediately following a meal.   Multiple Vitamin (MULTIVITAMIN) tablet Take 1 tablet by mouth daily.   NYSTATIN powder APPLY  POWDER TOPICALLY THREE TIMES DAILY   pantoprazole (PROTONIX) 40 MG tablet Take 1 tablet (40 mg total) by mouth daily.   sertraline (ZOLOFT) 25 MG tablet Take 25 mg by mouth every morning.   tolterodine (DETROL LA) 4 MG 24 hr capsule Take 1 capsule (4 mg total) by mouth daily.   No facility-administered encounter medications on file as of 11/03/2021.    Patient Active Problem List   Diagnosis Date Noted   Total knee replacement status, right 10/03/2019   Encounter for screening mammogram for breast cancer 07/02/2018   Anterolisthesis 08/06/2017   DDD (degenerative disc disease), lumbosacral 08/06/2017   Chondromalacia patellae 07/03/2017   Osteoporosis 12/14/2016   Morbid obesity ([place]) 07/17/2016   Spinal stenosis in cervical region 05/22/2016   Thyroid nodule 05/22/2016   GERD without esophagitis 03/16/2016   Neck muscle spasm 03/16/2016   History of back surgery 03/16/2016   Osteoarthritis of both knees 03/16/2016   Large breasts 03/16/2016   Hypertension, benign 03/16/2016   Perennial allergic rhinitis with seasonal variation 03/16/2016   History of pneumococcal pneumonia 09/20/2015   Hodgkin  disease ([place]) 07/15/2014   Breast cancer, right ([place]) 06/26/2013     Patient Care Plan: RN Care Management Plan of Care     Problem Identified: HLD and HTN      Long-Range Goal: Disease Progression Prevented or Minimized Completed 11/03/2021  Priority: High  Note:   Current Barriers:  Chronic Disease Management support and education needs related to HTN  and HLD   RNCM Clinical Goal(s):  Patient will demonstrate ongoing adherence to prescribed treatment plan for HTN and HLD through collaboration with the provider, RN Care Manager and the care team.  Interventions: 1:1 collaboration with primary care provider regarding development and update of comprehensive plan of care as evidenced by provider attestation and co-signature Inter-disciplinary care team collaboration (see longitudinal plan of care) Evaluation of current treatment plan related to  self management and patient's adherence to plan as established by provider.   Hyperlipidemia Interventions:   Medications reviewed. Advised to continue taking medications as prescribed. Advised to notify the provider is unable to tolerate the regimen.  Reviewed provider established cholesterol goals. Discussed importance of regular laboratory monitoring. Reviewed importance of limiting foods high in cholesterol. Advised to continue engaging in low impact exercises as tolerated.   Mixed Incontinence: Discussed plan for management of incontinence. Reports taking Detrol as prescribed and completing nerve stimulation as scheduled. Declines current need for incontinence supplies.   Hypertension Interventions:      Provided information regarding established blood pressure parameters along with indications for notifying a provider. Encouraged to monitor and record readings.  Discussed compliance with recommended cardiac prudent diet. Encouraged to read nutrition labels, monitor sodium intake, and avoid highly processed foods when possible. Reviewed s/sx of heart attack, stroke and worsening symptoms that require immediate medical attention.   Patient Goals/Self-Care Activities: Take all medications as prescribed Attend all scheduled provider appointments Call pharmacy for medication refills 3-7 days in advance of running out of medications Perform all self care activities independently  Call provider  office for new concerns or questions    Goal Met      PLAN Mrs. [place] has met her care management goals. Agreed to contact the clinic if her health needs change and outreach is required. The care management team will gladly assist.   Horris Latino [facility] Care Management [facility] [phone]

## 2021-11-03 NOTE — Patient Instructions (Signed)
Thank you for allowing the Chronic Care Management team to participate in your care. It was great speaking with you today! °

## 2021-11-05 ENCOUNTER — Other Ambulatory Visit: Payer: Self-pay | Admitting: Family Medicine

## 2021-11-05 DIAGNOSIS — M170 Bilateral primary osteoarthritis of knee: Secondary | ICD-10-CM

## 2021-11-05 DIAGNOSIS — I700 Atherosclerosis of aorta: Secondary | ICD-10-CM

## 2021-11-05 NOTE — Telephone Encounter (Signed)
Called pharmacy and noted that last RF was dated 10/11/21 but was printed. Phoned in order Atorvastatin 20 mg 1 tablet daily #90 zero refills.  Requested Prescriptions  Pending Prescriptions Disp Refills   atorvastatin (LIPITOR) 10 MG tablet [Pharmacy Med Name: Atorvastatin Calcium 10 MG Oral Tablet] 90 tablet 0    Sig: Take 1 tablet by mouth once daily     Cardiovascular:  Antilipid - Statins Failed - 11/05/2021  1:23 PM      Failed - Total Cholesterol in normal range and within 360 days    Cholesterol  Date Value Ref Range Status  08/11/2019 195 <200 mg/dL Final          Failed - LDL in normal range and within 360 days    LDL Cholesterol (Calc)  Date Value Ref Range Status  08/11/2019 100 (H) mg/dL (calc) Final    Comment:    Reference range: <100 . Desirable range <100 mg/dL for primary prevention;   <70 mg/dL for patients with CHD or diabetic patients  with > or = 2 CHD risk factors. Marland Kitchen LDL-C is now calculated using the Martin-Hopkins  calculation, which is a validated novel method providing  better accuracy than the Friedewald equation in the  estimation of LDL-C.  Cresenciano Genre et al. Annamaria Helling. 6286;381(77): 2061-2068  (http://education.[employer].com/faq/FAQ164)           Failed - HDL in normal range and within 360 days    HDL  Date Value Ref Range Status  08/11/2019 76 > OR = 50 mg/dL Final          Failed - Triglycerides in normal range and within 360 days    Triglycerides  Date Value Ref Range Status  08/11/2019 93 <150 mg/dL Final          Passed - Patient is not pregnant      Passed - Valid encounter within last 12 months    Recent Outpatient Visits           3 weeks ago Ischemic demyelination of brain [facility])   [facility] Steele Sizer, MD   2 months ago Syncope and collapse   [facility] Steele Sizer, MD   4 months ago Atherosclerosis of aorta [facility])   [facility] Steele Sizer, MD    8 months ago Vasovagal syncope   [facility] Just, Laurita Quint, FNP   10 months ago Hodgkin lymphoma of intra-abdominal lymph nodes, unspecified Hodgkin lymphoma type [facility])   [facility] Steele Sizer, MD       Future Appointments             In 2 weeks McGowan, Gordan Payment [place]   In 3 months MacDiarmid, Nicki Reaper, MD [place]   In 3 months Steele Sizer, MD [facility], [facility]

## 2021-11-05 NOTE — Telephone Encounter (Signed)
last RF 07/08/21 #90 1 RF should have enough until 3/23  Requested Prescriptions  Refused Prescriptions Disp Refills   meloxicam (MOBIC) 15 MG tablet [Pharmacy Med Name: Meloxicam 15 MG Oral Tablet] 90 tablet 0    Sig: Take 1 tablet by mouth once daily     Analgesics:  COX2 Inhibitors Passed - 11/05/2021 11:42 AM      Passed - HGB in normal range and within 360 days    Hemoglobin  Date Value Ref Range Status  04/14/2021 12.5 12.0 - 15.0 g/dL Final  03/16/2016 13.4 11.1 - 15.9 g/dL Final         Passed - Cr in normal range and within 360 days    Creat  Date Value Ref Range Status  08/11/2019 0.65 0.60 - 0.93 mg/dL Final    Comment:    For patients >71 years of age, the reference limit for Creatinine is approximately 13% higher for people identified as African-American. .    Creatinine, Ser  Date Value Ref Range Status  06/28/2021 0.69 0.44 - 1.00 mg/dL Final         Passed - Patient is not pregnant      Passed - Valid encounter within last 12 months    Recent Outpatient Visits          3 weeks ago Ischemic demyelination of brain [facility])   [facility] Steele Sizer, MD   2 months ago Syncope and collapse   [facility] Steele Sizer, MD   4 months ago Atherosclerosis of aorta [facility])   [facility] Steele Sizer, MD   8 months ago Vasovagal syncope   [facility] Just, Laurita Quint, FNP   10 months ago Hodgkin lymphoma of intra-abdominal lymph nodes, unspecified Hodgkin lymphoma type [facility])   [facility] Steele Sizer, MD      Future Appointments            In 2 weeks McGowan, Gordan Payment [place]   In 3 months MacDiarmid, Nicki Reaper, MD [place]   In 3 months Steele Sizer, MD [facility]

## 2021-11-21 ENCOUNTER — Other Ambulatory Visit: Payer: Self-pay

## 2021-11-21 ENCOUNTER — Ambulatory Visit (INDEPENDENT_AMBULATORY_CARE_PROVIDER_SITE_OTHER): Payer: Medicare Other | Admitting: Urology

## 2021-11-21 DIAGNOSIS — R350 Frequency of micturition: Secondary | ICD-10-CM | POA: Diagnosis not present

## 2021-11-21 NOTE — Progress Notes (Signed)
Patient ID: Mary Todd, female   DOB: [birth year], 76 y.o.   MRN: [id number] PTNS  Session # 29 of 45  Health & Social Factors: no change Caffeine: 2 Alcohol: 0 Daytime voids #per day: 3 Night-time voids #per night: 1 Urgency: mild Incontinence Episodes #per day: 0 Ankle used: right Treatment Setting: 3 Feeling/ Response: both Comments: pt tolerated well  Performed By: Edwin Dada, CMA  Follow Up: 30 of 45 in 1 mth

## 2021-11-27 DIAGNOSIS — G4733 Obstructive sleep apnea (adult) (pediatric): Secondary | ICD-10-CM | POA: Diagnosis not present

## 2021-11-29 DIAGNOSIS — I10 Essential (primary) hypertension: Secondary | ICD-10-CM | POA: Diagnosis not present

## 2021-12-06 ENCOUNTER — Other Ambulatory Visit: Payer: Self-pay | Admitting: Podiatry

## 2021-12-07 ENCOUNTER — Encounter: Payer: Self-pay | Admitting: Emergency Medicine

## 2021-12-07 ENCOUNTER — Ambulatory Visit (INDEPENDENT_AMBULATORY_CARE_PROVIDER_SITE_OTHER): Payer: Medicare Other

## 2021-12-07 ENCOUNTER — Ambulatory Visit
Admission: EM | Admit: 2021-12-07 | Discharge: 2021-12-07 | Disposition: A | Discharge status: Home / Self Care | Payer: Medicare Other | Attending: Family Medicine | Admitting: Family Medicine

## 2021-12-07 ENCOUNTER — Other Ambulatory Visit: Payer: Self-pay

## 2021-12-07 DIAGNOSIS — R059 Cough, unspecified: Secondary | ICD-10-CM | POA: Diagnosis not present

## 2021-12-07 DIAGNOSIS — J069 Acute upper respiratory infection, unspecified: Secondary | ICD-10-CM

## 2021-12-07 MED ORDER — ALBUTEROL SULFATE HFA 108 (90 BASE) MCG/ACT IN AERS: 2.0000 | INHALATION_SPRAY | RESPIRATORY_TRACT | 0 refills | Status: DC | PRN

## 2021-12-07 MED ORDER — AEROCHAMBER PLUS MISC: 2 refills | Status: DC

## 2021-12-07 MED ORDER — PROMETHAZINE-DM 6.25-15 MG/5ML PO SYRP: 5.0000 mL | ORAL_SOLUTION | Freq: Four times a day (QID) | ORAL | 0 refills | Status: DC | PRN

## 2021-12-07 NOTE — ED Provider Notes (Signed)
[facility]    CSN: [id number] Arrival date & time: 12/07/21  1048      History   Chief Complaint Chief Complaint  Patient presents with   Cough    HPI Mary Todd is a 76 y.o. female. Presenting with cough and congestion for about 1 week. Now chest seems more tight and congested with associated SOB during episodes of coughing. She does also endorse a productive cough with yellowish phlegm; no blood. She denies fever, weakness/fatigue, current SOB, or chest pain. Treatment includes mucinex which did not help. Took home COVID this past Saturday and negative. Patient is covid vaccinated.    Cough Associated symptoms: no chest pain, no ear pain, no fever, no headaches, no shortness of breath and no wheezing    Past Medical History:  Diagnosis Date   Allergy    Arthritis    knees, lower back   Cancer [facility])    Esophageal reflux    Hypertension 2006   Osteoporosis    Personal history of malignant neoplasm of breast 2011   right breast   Personal history of radiation therapy 2011   RIGHT lumpectomy w/ radiation    Patient Active Problem List   Diagnosis Date Noted   Total knee replacement status, right 10/03/2019   Encounter for screening mammogram for breast cancer 07/02/2018   Anterolisthesis 08/06/2017   DDD (degenerative disc disease), lumbosacral 08/06/2017   Chondromalacia patellae 07/03/2017   Osteoporosis 12/14/2016   Morbid obesity ([place]) 07/17/2016   Spinal stenosis in cervical region 05/22/2016   Thyroid nodule 05/22/2016   GERD without esophagitis 03/16/2016   Neck muscle spasm 03/16/2016   History of back surgery 03/16/2016   Osteoarthritis of both knees 03/16/2016   Large breasts 03/16/2016   Hypertension, benign 03/16/2016   Perennial allergic rhinitis with seasonal variation 03/16/2016   History of pneumococcal pneumonia 09/20/2015   Hodgkin disease ([place]) 07/15/2014   Breast cancer, right ([place]) 06/26/2013    Past Surgical  History:  Procedure Laterality Date   ABDOMINAL HYSTERECTOMY     BACK SURGERY     BREAST BIOPSY Right 2011   radiation   BREAST LUMPECTOMY Right 2011   w/ radiation   BREAST SURGERY Right 2011   right breast lumpectomy,L/SN/R for right breast CA    CATARACT EXTRACTION W/PHACO Left 06/10/2018   Procedure: CATARACT EXTRACTION PHACO AND INTRAOCULAR LENS PLACEMENT ([place]) LEFT;  Surgeon: Eulogio Bear, MD;  Location: [place];  Service: Ophthalmology;  Laterality: Left;  Diabetic - oral meds   CATARACT EXTRACTION W/PHACO Right 07/15/2018   Procedure: CATARACT EXTRACTION PHACO AND INTRAOCULAR LENS PLACEMENT ([place]) RIGHT;  Surgeon: Eulogio Bear, MD;  Location: [place];  Service: Ophthalmology;  Laterality: Right;   COLONOSCOPY  2003   PORT A CATH REVISION     REPLACEMENT TOTAL KNEE Right 10/03/2019   DR. Harlow Mares    WRIST SURGERY  2005    OB History     Gravida  2   Para  2   Term      Preterm      AB      Living  2      SAB      IAB      Ectopic      Multiple      Live Births           Obstetric Comments  1st Menstrual Cycle:  12 1st Pregnancy:  22.  Home Medications    Prior to Admission medications   Medication Sig Start Date End Date Taking? Authorizing Provider  acetaminophen (TYLENOL) 650 MG CR tablet Take 1,300 mg by mouth every 8 (eight) hours as needed for pain.   Yes [provider]  albuterol (VENTOLIN HFA) 108 (90 Base) MCG/ACT inhaler Inhale 2 puffs into the lungs every 4 (four) hours as needed for wheezing or shortness of breath. 12/07/21  Yes Josephanthony Tindel, Genia Del, MD  amLODipine-valsartan (EXFORGE) 5-160 MG tablet Take 1 tablet by mouth daily. 07/08/21  Yes Steele Sizer, MD  aspirin 81 MG tablet Take 81 mg by mouth daily.   Yes [provider]  atorvastatin (LIPITOR) 20 MG tablet Take 1 tablet (20 mg total) by mouth daily. 10/11/21  Yes Sowles, Drue Stager, MD  buPROPion (WELLBUTRIN XL) 150 MG 24  hr tablet Take 1 tablet (150 mg total) by mouth daily. 10/11/21  Yes Sowles, Drue Stager, MD  CALCIUM-MAGNESIUM PO Take 1 tablet by mouth daily.   Yes [provider]  cetirizine (ZYRTEC) 10 MG tablet Take 10 mg by mouth daily.   Yes [provider]  Cholecalciferol (VITAMIN D-3 PO) Take 1 tablet by mouth daily.   Yes [provider]  denosumab (PROLIA) 60 MG/ML SOSY injection Inject 60 mg into the skin every 6 (six) months.   Yes Lequita Asal, MD  gabapentin (NEURONTIN) 100 MG capsule Take 1 capsule by mouth at bedtime 09/12/21  Yes Edrick Kins, DPM  meloxicam (MOBIC) 15 MG tablet Take 1 tablet (15 mg total) by mouth daily. 07/08/21  Yes Sowles, Drue Stager, MD  metoprolol succinate (TOPROL-XL) 50 MG 24 hr tablet Take 1 tablet (50 mg total) by mouth daily. Take with or immediately following a meal. 07/08/21  Yes Sowles, Drue Stager, MD  Multiple Vitamin (MULTIVITAMIN) tablet Take 1 tablet by mouth daily.   Yes [provider]  pantoprazole (PROTONIX) 40 MG tablet Take 1 tablet (40 mg total) by mouth daily. 10/11/21  Yes Sowles, Drue Stager, MD  promethazine-dextromethorphan (PROMETHAZINE-DM) 6.25-15 MG/5ML syrup Take 5 mLs by mouth 4 (four) times daily as needed for cough. 12/07/21  Yes Carrie Mew, MD  sertraline (ZOLOFT) 25 MG tablet Take 25 mg by mouth every morning. 09/27/21  Yes [provider]  Spacer/Aero-Holding Chambers (AEROCHAMBER PLUS) inhaler Use as instructed 12/07/21  Yes Carrie Mew, MD  tolterodine (DETROL LA) 4 MG 24 hr capsule Take 1 capsule (4 mg total) by mouth daily. 01/31/21  Yes MacDiarmid, Nicki Reaper, MD  NYSTATIN powder APPLY  POWDER TOPICALLY THREE TIMES DAILY 07/05/21   Steele Sizer, MD    Family History Family History  Problem Relation Age of Onset   Cancer Father    Bone cancer Father    Breast cancer Mother 35   Stroke Mother    Colon cancer Maternal Grandmother    Breast cancer Cousin     Social History Social History    Tobacco Use   Smoking status: Never   Smokeless tobacco: Never   Tobacco comments:    smoking cessation materials not required  Vaping Use   Vaping Use: Never used  Substance Use Topics   Alcohol use: Yes    Alcohol/week: 2.0 standard drinks    Types: 2 Glasses of wine per week    Comment: occassionally   Drug use: No     Allergies   Shrimp [shellfish allergy] and Augmentin [amoxicillin-pot clavulanate]   Review of Systems Review of Systems  Constitutional:  Negative for  fatigue and fever.  HENT:  Positive for congestion. Negative for ear pain.   Respiratory:  Positive for cough and chest tightness. Negative for choking, shortness of breath, wheezing and stridor.   Cardiovascular:  Negative for chest pain, palpitations and leg swelling.  Gastrointestinal:  Negative for abdominal pain.  Neurological:  Negative for headaches.    Physical Exam Triage Vital Signs ED Triage Vitals  Enc Vitals Group     BP 12/07/21 1101 (!) 154/87     Pulse Rate 12/07/21 1101 82     Resp 12/07/21 1101 18     Temp 12/07/21 1101 98 F (36.7 C)     Temp Source 12/07/21 1101 Oral     SpO2 12/07/21 1101 97 %     Weight 12/07/21 1059 231 lb 0.7 oz (104.8 kg)     Height 12/07/21 1059 5\' 3"  (1.6 m)     Head Circumference --      Peak Flow --      Pain Score 12/07/21 1058 5     Pain Loc --      Pain Edu? --      Excl. in [place]? --    No data found.  Updated Vital Signs BP (!) 138/92 (BP Location: Right Arm)    Pulse 82    Temp 98 F (36.7 C) (Oral)    Resp 18    Ht 5\' 3"  (1.6 m)    Wt 104.8 kg    SpO2 97%    BMI 40.93 kg/m   Visual Acuity Right Eye Distance:   Left Eye Distance:   Bilateral Distance:    Right Eye Near:   Left Eye Near:    Bilateral Near:     Physical Exam Vitals reviewed.  Constitutional:      General: She is not in acute distress.    Appearance: Normal appearance.  HENT:     Head: Normocephalic and atraumatic.     Nose: Nose normal.  Eyes:     Extraocular  Movements: Extraocular movements intact.     Conjunctiva/sclera: Conjunctivae normal.     Pupils: Pupils are equal, round, and reactive to light.  Cardiovascular:     Rate and Rhythm: Normal rate and regular rhythm.     Pulses: Normal pulses.     Heart sounds: Normal heart sounds.  Pulmonary:     Effort: Pulmonary effort is normal.     Breath sounds: Normal breath sounds.  Lymphadenopathy:     Cervical: No cervical adenopathy.  Neurological:     General: No focal deficit present.     Mental Status: She is alert and oriented to person, place, and time.  Psychiatric:        Mood and Affect: Mood normal.      UC Treatments / Results  Labs (all labs ordered are listed, but only abnormal results are displayed) Labs Reviewed - No data to display  EKG   Radiology DG Chest 2 View  Result Date: 12/07/2021 CLINICAL DATA:  Cough and congestion for about a week. EXAM: CHEST - 2 VIEW COMPARISON:  Radiographs 12/04/2018.  CT 04/20/2021. FINDINGS: The heart size and mediastinal contours are stable. There is stable linear scarring or atelectasis at both lung bases. The lungs are otherwise clear. No pleural effusion or pneumothorax. The bones appear unremarkable. IMPRESSION: Stable chest.  No active cardiopulmonary process. Electronically Signed   By: Richardean Sale M.D.   On: 12/07/2021 12:02    Procedures Procedures (including critical care time)  Medications Ordered in UC Medications - No data to display  Initial Impression / Assessment and Plan / UC Course  I have reviewed the triage vital signs and the nursing notes.  Pertinent labs & imaging results that were available during my care of the patient were reviewed by me and considered in my medical decision making (see chart for details).  Clinical Course as of 12/07/21 1218  Wed Dec 07, 2021  1142 DG Chest 2 View [AF]  1147 BP(!): 154/87 [AF]  1155 DG Chest 2 View [AF]  3500 DG Chest 2 View [AF]    Clinical Course User  Index [AF] Carrie Mew, MD     URI- likely viral. Reviewed xray and unremarkable Encouraged symptomatic treatment at this time Patient to use albuterol prn coughing/wheezing and cough syrup as needed Discussed risks, benefits, and side effects of medication. Patient to follow up if symptoms worsen or fail to resolve.  Final Clinical Impressions(s) / UC Diagnoses   Final diagnoses:  Viral URI with cough     Discharge Instructions      Please use albuterol 2 puff as needed, this should help with congestion.  Please take the cough syrup as needed Humidifiers will also help with your symptoms      ED Prescriptions     Medication Sig Dispense Auth. Provider   promethazine-dextromethorphan (PROMETHAZINE-DM) 6.25-15 MG/5ML syrup Take 5 mLs by mouth 4 (four) times daily as needed for cough. 118 mL Carrie Mew, MD   albuterol (VENTOLIN HFA) 108 (90 Base) MCG/ACT inhaler Inhale 2 puffs into the lungs every 4 (four) hours as needed for wheezing or shortness of breath. 1 each Carrie Mew, MD   Spacer/Aero-Holding Chambers (AEROCHAMBER PLUS) inhaler Use as instructed 1 each Carrie Mew, MD      PDMP not reviewed this encounter.   Carrie Mew, MD 12/07/21 1340

## 2021-12-07 NOTE — Discharge Instructions (Addendum)
Please use albuterol 2 puff as needed, this should help with congestion.  Please take the cough syrup as needed Humidifiers will also help with your symptoms

## 2021-12-07 NOTE — ED Triage Notes (Signed)
Pt c/o cough, chest congestion, nasal congestion. Started about a week ago. Denies fever.

## 2021-12-14 ENCOUNTER — Other Ambulatory Visit: Payer: Self-pay | Admitting: Podiatry

## 2021-12-21 ENCOUNTER — Other Ambulatory Visit: Payer: Self-pay | Admitting: *Deleted

## 2021-12-21 DIAGNOSIS — C50911 Malignant neoplasm of unspecified site of right female breast: Secondary | ICD-10-CM

## 2021-12-21 DIAGNOSIS — C8193 Hodgkin lymphoma, unspecified, intra-abdominal lymph nodes: Secondary | ICD-10-CM

## 2021-12-21 DIAGNOSIS — M810 Age-related osteoporosis without current pathological fracture: Secondary | ICD-10-CM

## 2021-12-23 DIAGNOSIS — Z20822 Contact with and (suspected) exposure to covid-19: Secondary | ICD-10-CM | POA: Diagnosis not present

## 2021-12-23 NOTE — Progress Notes (Signed)
Error

## 2021-12-26 ENCOUNTER — Other Ambulatory Visit: Payer: Self-pay

## 2021-12-26 ENCOUNTER — Ambulatory Visit (INDEPENDENT_AMBULATORY_CARE_PROVIDER_SITE_OTHER): Payer: Medicare Other | Admitting: Urology

## 2021-12-26 DIAGNOSIS — R32 Unspecified urinary incontinence: Secondary | ICD-10-CM | POA: Diagnosis not present

## 2021-12-26 NOTE — Progress Notes (Signed)
Patient ID: Mary Todd, female   DOB: 1946/02/04, 76 y.o.   MRN: [id number] PTNS  Session # 30 of 45  Health & Social Factors: no change Caffeine: 1 Alcohol: 0 Daytime voids #per day: 3 Night-time voids #per night: 1 Urgency: mild Incontinence Episodes #per day: 0 Ankle used: right Treatment Setting: 2 Feeling/ Response: sensory Comments: pt tolerated well  Performed By: Edwin Dada, CMA  Follow Up: #31 of 45

## 2021-12-29 ENCOUNTER — Encounter: Payer: Self-pay | Admitting: Oncology

## 2021-12-29 ENCOUNTER — Inpatient Hospital Stay (HOSPITAL_BASED_OUTPATIENT_CLINIC_OR_DEPARTMENT_OTHER): Payer: Medicare Other | Admitting: Oncology

## 2021-12-29 ENCOUNTER — Inpatient Hospital Stay: Payer: Medicare Other

## 2021-12-29 ENCOUNTER — Ambulatory Visit: Payer: Medicare Other

## 2021-12-29 ENCOUNTER — Other Ambulatory Visit: Payer: Self-pay

## 2021-12-29 ENCOUNTER — Inpatient Hospital Stay: Payer: Medicare Other | Attending: Oncology

## 2021-12-29 VITALS — BP 154/110 | HR 88 | Temp 96.6°F | Wt 235.0 lb

## 2021-12-29 DIAGNOSIS — C8193 Hodgkin lymphoma, unspecified, intra-abdominal lymph nodes: Secondary | ICD-10-CM

## 2021-12-29 DIAGNOSIS — Z1231 Encounter for screening mammogram for malignant neoplasm of breast: Secondary | ICD-10-CM | POA: Diagnosis not present

## 2021-12-29 DIAGNOSIS — M810 Age-related osteoporosis without current pathological fracture: Secondary | ICD-10-CM

## 2021-12-29 DIAGNOSIS — C50911 Malignant neoplasm of unspecified site of right female breast: Secondary | ICD-10-CM | POA: Insufficient documentation

## 2021-12-29 DIAGNOSIS — M8588 Other specified disorders of bone density and structure, other site: Secondary | ICD-10-CM | POA: Diagnosis not present

## 2021-12-29 DIAGNOSIS — M8580 Other specified disorders of bone density and structure, unspecified site: Secondary | ICD-10-CM | POA: Insufficient documentation

## 2021-12-29 LAB — COMPREHENSIVE METABOLIC PANEL
ALT: 30 U/L (ref 0–44)
AST: 24 U/L (ref 15–41)
Albumin: 4 g/dL (ref 3.5–5.0)
Alkaline Phosphatase: 87 U/L (ref 38–126)
Anion gap: 6 (ref 5–15)
BUN: 16 mg/dL (ref 8–23)
CO2: 28 mmol/L (ref 22–32)
Calcium: 9.4 mg/dL (ref 8.9–10.3)
Chloride: 100 mmol/L (ref 98–111)
Creatinine, Ser: 0.68 mg/dL (ref 0.44–1.00)
GFR, Estimated: >60 mL/min (ref >60)
Glucose, Bld: 100 mg/dL — ABNORMAL HIGH (ref 70–99)
Potassium: 4.5 mmol/L (ref 3.5–5.1)
Sodium: 134 mmol/L — ABNORMAL LOW (ref 135–145)
Total Bilirubin: 0.5 mg/dL (ref 0.3–1.2)
Total Protein: 7.8 g/dL (ref 6.5–8.1)

## 2021-12-29 LAB — CBC WITH DIFFERENTIAL/PLATELET
Abs Immature Granulocytes: 0.04 10*3/uL (ref 0.00–0.07)
Basophils Absolute: 0 10*3/uL (ref 0.0–0.1)
Basophils Relative: 1 %
Eosinophils Absolute: 0.3 10*3/uL (ref 0.0–0.5)
Eosinophils Relative: 4 %
HCT: 37.8 % (ref 36.0–46.0)
Hemoglobin: 12.7 g/dL (ref 12.0–15.0)
Immature Granulocytes: 1 %
Lymphocytes Relative: 32 %
Lymphs Abs: 2.4 10*3/uL (ref 0.7–4.0)
MCH: 30.5 pg (ref 26.0–34.0)
MCHC: 33.6 g/dL (ref 30.0–36.0)
MCV: 90.6 fL (ref 80.0–100.0)
Monocytes Absolute: 0.7 10*3/uL (ref 0.1–1.0)
Monocytes Relative: 9 %
Neutro Abs: 4.1 10*3/uL (ref 1.7–7.7)
Neutrophils Relative %: 53 %
Platelets: 209 10*3/uL (ref 150–400)
RBC: 4.17 MIL/uL (ref 3.87–5.11)
RDW: 14.6 % (ref 11.5–15.5)
WBC: 7.6 10*3/uL (ref 4.0–10.5)
nRBC: 0 % (ref 0.0–0.2)

## 2021-12-29 LAB — LACTATE DEHYDROGENASE: LDH: 137 U/L (ref 98–192)

## 2021-12-29 MED ORDER — DENOSUMAB 60 MG/ML ~~LOC~~ SOSY
60.0000 mg | PREFILLED_SYRINGE | Freq: Once | SUBCUTANEOUS | Status: AC
  Administered 2021-12-29: 60 mg via SUBCUTANEOUS
  Filled 2021-12-29: qty 1

## 2021-12-29 NOTE — Progress Notes (Signed)
Hematology/Oncology Progress note [phone] [phone]      Clinic Day:  12/29/2021  Referring physician: Steele Sizer, MD  Chief Complaint: Mary Todd is a 76 y.o. female with stage II Hodgkin's disease (2004) and stage IA right breast cancer (2011)  Mary Todd is a 76 y.o.afemale who has above oncology history reviewed by me today presented for follow up visit for management of stage II Hodgkin's disease (2004) and stage IA right breast cancer (2011)   Patient previously followed up by Dr.Corcoran, patient switched care to me on 04/14/2022 Extensive medical record review was performed by me  stage II Hodgkin's disease (2004) and stage IA right breast cancer (2011).   2004 stage IIA Hodgkin's disease after presenting with low counts and joint pain.  Abdomen and pelvic CT scan as well as PET scan revealed a pelvic mass and retroperitoneal lymphadenopathy.  Biopsy confirmed Hodgkin's disease.     She received ABVD times 5 through cycle 3-A and then AVD without the Bleomycin times 7 subsequent cycles.  Treatment completed in 12/2003.     CT and PET scans were negative in 05/2006 and in 06/2006.   07/21/2010. She underwent right breast lumpectomy and sentinel lymph node biopsy Pathology revealed a 0.5 cm grade II invasive ductal carcinoma.  Two sentinel lymph nodes were negative.  Tumor was ER + (>90%), PR + (40%), and Her2/neu 1+.  Pathologic stage was [id number].  She received Mammosite radiation (completed 08/22/2010).   Oncotype DX testing revealed a low risk lesion.  BRCA1/2 testing was negative.  She received 5 years of an aromatase inhibitor.   Breast cancer index (BCI) testing on 07/23/2015 revealed a 6.3% (CI: 2.9%-9.6%) risk of late recurrent disease (years 5-10) after 5 years of hormonal therapy and a high likelihood of benefit from continued hormonal therapy (30% reduction).  Decision was made to continue  Arimidex for 5 more years.   Bilateral screening mammogram on 07/13/2020 revealed no evidence of malignancy.    Thyroid ultrasound  on 04/13/2007 revealed a 2.3 cm solid and cystic nodule within the left thyroid gland. There was also a small nodule within the right thyroid gland that did not meet size criteria for measurement. There was a single benign appearing lymph node within the right neck.  FNA in 06/2016 was  "indeterminate" and in 08/2016 was "benign".     Bone density study on 09/05/2011 revealed osteopenia with a  T-score of -1.7 in L1-L4.  Bone density study on 11/14/2012 revealed osteopenia with a  T-score of -1.5 in L1-L4.  Bone density study on 12/14/2016 revealed osteoporosis in L2-3 with a T-score of -3.1.  Left femur normal with T-score of -0.8.  Bone density on 05/05/2019 revealed osteopenia with a T-score of -2.4 in the AP spine L2-L3.  She began Prolia on 01/24/2017, every 6 months   INTERVAL HISTORY Mary Todd is a 76 y.o. female who has above history reviewed by me today presents for evaluation of night sweats, fatigue and tiredness, hot flashes.  She has finished 10 years of Arimidex and is stopped in March 2022. At last visit, patient felt more fatigue, tiredness and night sweats since coming off Arimidex. 04/21/2021, CT scan showed no pathologically enlarged abnormal or pelvic lymph nodes and no evidence of metastatic disease within the chest abdomen and pelvis.  Left-sided thyroid nodule 1.6 cm.  Mild hepatomegaly with diffuse hepatic steatosis.  Left-sided colonic diverticulosis without findings of acute diverticulitis. Patient reports thyroid  nodule has been monitored by endocrinology/PCP.  thyroid ultrasound done in July 2021 which showed multiple stable thyroid nodules.we recommend her to continue follow up with them to monitor her thyroid nodules.   Patient reports that currently her symptoms has improved after her primary care provider started her on  medication to "take the edge off".  Review of Systems  Constitutional:  Positive for fatigue. Negative for appetite change, chills and fever.  HENT:   Negative for hearing loss and voice change.   Eyes:  Negative for eye problems.  Respiratory:  Negative for chest tightness and cough.   Cardiovascular:  Negative for chest pain.  Gastrointestinal:  Negative for abdominal distention, abdominal pain and blood in stool.  Endocrine: Negative for hot flashes.  Genitourinary:  Negative for difficulty urinating and frequency.   Musculoskeletal:  Negative for arthralgias.  Skin:  Negative for itching and rash.  Neurological:  Negative for extremity weakness.  Hematological:  Negative for adenopathy.  Psychiatric/Behavioral:  Negative for confusion.     Past Medical History:  Diagnosis Date   Allergy    Arthritis    knees, lower back   Cancer [facility])    Esophageal reflux    Hypertension 2006   Osteoporosis    Personal history of malignant neoplasm of breast 2011   right breast   Personal history of radiation therapy 2011   RIGHT lumpectomy w/ radiation    Past Surgical History:  Procedure Laterality Date   ABDOMINAL HYSTERECTOMY     BACK SURGERY     BREAST BIOPSY Right 2011   radiation   BREAST LUMPECTOMY Right 2011   w/ radiation   BREAST SURGERY Right 2011   right breast lumpectomy,L/SN/R for right breast CA    CATARACT EXTRACTION W/PHACO Left 06/10/2018   Procedure: CATARACT EXTRACTION PHACO AND INTRAOCULAR LENS PLACEMENT ([place]) LEFT;  Surgeon: Eulogio Bear, MD;  Location: [place];  Service: Ophthalmology;  Laterality: Left;  Diabetic - oral meds   CATARACT EXTRACTION W/PHACO Right 07/15/2018   Procedure: CATARACT EXTRACTION PHACO AND INTRAOCULAR LENS PLACEMENT ([place]) RIGHT;  Surgeon: Eulogio Bear, MD;  Location: [place];  Service: Ophthalmology;  Laterality: Right;   COLONOSCOPY  2003   PORT A CATH REVISION     REPLACEMENT TOTAL KNEE Right  10/03/2019   DR. Pitts  2005    Family History  Problem Relation Age of Onset   Cancer Father    Bone cancer Father    Breast cancer Mother 44   Stroke Mother    Colon cancer Maternal Grandmother    Breast cancer Cousin     Social History:  reports that she has never smoked. She has never used smokeless tobacco. She reports current alcohol use of about 2.0 standard drinks per week. She reports that she does not use drugs.The patient is alone today.  Allergies:  Allergies  Allergen Reactions   Shrimp [Shellfish Allergy] Hives   Augmentin [Amoxicillin-Pot Clavulanate] Nausea Only    Current Medications: Current Outpatient Medications  Medication Sig Dispense Refill   acetaminophen (TYLENOL) 650 MG CR tablet Take 1,300 mg by mouth every 8 (eight) hours as needed for pain.     albuterol (VENTOLIN HFA) 108 (90 Base) MCG/ACT inhaler Inhale 2 puffs into the lungs every 4 (four) hours as needed for wheezing or shortness of breath. 1 each 0   amLODipine-valsartan (EXFORGE) 5-160 MG tablet Take 1 tablet by mouth daily. 90 tablet 1  aspirin 81 MG tablet Take 81 mg by mouth daily.     atorvastatin (LIPITOR) 20 MG tablet Take 1 tablet (20 mg total) by mouth daily. 90 tablet 0   buPROPion (WELLBUTRIN XL) 150 MG 24 hr tablet Take 1 tablet (150 mg total) by mouth daily. 90 tablet 1   CALCIUM-MAGNESIUM PO Take 1 tablet by mouth daily.     cetirizine (ZYRTEC) 10 MG tablet Take 10 mg by mouth daily.     Cholecalciferol (VITAMIN D-3 PO) Take 1 tablet by mouth daily.     denosumab (PROLIA) 60 MG/ML SOSY injection Inject 60 mg into the skin every 6 (six) months.     gabapentin (NEURONTIN) 100 MG capsule Take 1 capsule by mouth at bedtime 90 capsule 0   meloxicam (MOBIC) 15 MG tablet Take 1 tablet (15 mg total) by mouth daily. 90 tablet 1   metoprolol succinate (TOPROL-XL) 50 MG 24 hr tablet Take 1 tablet (50 mg total) by mouth daily. Take with or immediately following a meal. 90  tablet 1   Multiple Vitamin (MULTIVITAMIN) tablet Take 1 tablet by mouth daily.     NYSTATIN powder APPLY  POWDER TOPICALLY THREE TIMES DAILY 60 g 0   pantoprazole (PROTONIX) 40 MG tablet Take 1 tablet (40 mg total) by mouth daily. 90 tablet 0   promethazine-dextromethorphan (PROMETHAZINE-DM) 6.25-15 MG/5ML syrup Take 5 mLs by mouth 4 (four) times daily as needed for cough. 118 mL 0   sertraline (ZOLOFT) 25 MG tablet Take 25 mg by mouth every morning.     Spacer/Aero-Holding Chambers (AEROCHAMBER PLUS) inhaler Use as instructed 1 each 2   tolterodine (DETROL LA) 4 MG 24 hr capsule Take 1 capsule (4 mg total) by mouth daily. 90 capsule 3   No current facility-administered medications for this visit.     Performance status (ECOG): 0  Vitals Blood pressure (!) 154/110, pulse 88, temperature (!) 96.6 F (35.9 C), temperature source Tympanic, weight 235 lb (106.6 kg).  Physical Exam Constitutional:      General: She is not in acute distress.    Appearance: She is obese. She is not diaphoretic.  HENT:     Head: Normocephalic and atraumatic.     Nose: Nose normal.     Mouth/Throat:     Pharynx: No oropharyngeal exudate.  Eyes:     General: No scleral icterus.    Pupils: Pupils are equal, round, and reactive to light.  Cardiovascular:     Rate and Rhythm: Normal rate and regular rhythm.     Heart sounds: No murmur heard. Pulmonary:     Effort: Pulmonary effort is normal. No respiratory distress.     Breath sounds: No rales.  Chest:     Chest wall: No tenderness.  Abdominal:     General: There is no distension.     Palpations: Abdomen is soft.     Tenderness: There is no abdominal tenderness.  Musculoskeletal:        General: Normal range of motion.     Cervical back: Normal range of motion and neck supple.  Skin:    General: Skin is warm and dry.     Findings: No erythema.  Neurological:     Mental Status: She is alert and oriented to person, place, and time.     Cranial  Nerves: No cranial nerve deficit.     Motor: No abnormal muscle tone.     Coordination: Coordination normal.  Psychiatric:  Mood and Affect: Mood and affect normal.     Laboratory findings CBC    Component Value Date/Time   WBC 7.6 12/29/2021 1315   RBC 4.17 12/29/2021 1315   HGB 12.7 12/29/2021 1315   HGB 13.4 03/16/2016 1458   HCT 37.8 12/29/2021 1315   HCT 39.6 03/16/2016 1458   PLT 209 12/29/2021 1315   PLT 226 03/16/2016 1458   MCV 90.6 12/29/2021 1315   MCV 88 03/16/2016 1458   MCV 91 11/03/2014 1015   MCH 30.5 12/29/2021 1315   MCHC 33.6 12/29/2021 1315   RDW 14.6 12/29/2021 1315   RDW 15.3 03/16/2016 1458   RDW 14.1 11/03/2014 1015   LYMPHSABS 2.4 12/29/2021 1315   LYMPHSABS 2.5 03/16/2016 1458   LYMPHSABS 2.0 11/03/2014 1015   MONOABS 0.7 12/29/2021 1315   MONOABS 0.7 11/03/2014 1015   EOSABS 0.3 12/29/2021 1315   EOSABS 0.3 03/16/2016 1458   EOSABS 0.2 11/03/2014 1015   BASOSABS 0.0 12/29/2021 1315   BASOSABS 0.0 03/16/2016 1458   BASOSABS 0.1 11/03/2014 1015   CMP Latest Ref Rng & Units 12/29/2021 06/28/2021 04/14/2021  Glucose 70 - 99 mg/dL 100(H) 99 123(H)  BUN 8 - 23 mg/dL _0 Creatinine 0.44 - 1.00 mg/dL 0.68 0.69 0.61  Sodium 135 - 145 mmol/L 134(L) 136 136  Potassium 3.5 - 5.1 mmol/L 4.5 4.3 4.7  Chloride 98 - 111 mmol/L 100 102 104  CO2 22 - 32 mmol/L _1 Calcium 8.9 - 10.3 mg/dL 9.4 9.3 9.2  Total Protein 6.5 - 8.1 g/dL 7.8 - 7.5  Total Bilirubin 0.3 - 1.2 mg/dL 0.5 - 0.6  Alkaline Phos 38 - 126 U/L 87 - 69  AST 15 - 41 U/L 24 - 24  ALT 0 - 44 U/L 30 - 30     Assessment and plan 1. Malignant neoplasm of right female breast, unspecified estrogen receptor status, unspecified site of breast ([place])   2. Encounter for screening mammogram for breast cancer   3. Osteopenia of spine   4. Hodgkin lymphoma of intra-abdominal lymph nodes, unspecified Hodgkin lymphoma type ([place])     #History of stage II Hodgkin's lymphoma Patient  has been in remission for many years. Labs reviewed and discussed with patient. CT scan last June 2023 showed no signs of lymphoma recurrence. LDH is normal.   #Stage IA right breast cancer Clinically she is doing well except above symptoms. Finished 10 years of antiestrogen treatments. Continue annual screening mammogram.  #Osteopenia 05/05/2021, bone density showed stable osteopenia.   Continue calcium and vitamin D supplementation Continue Prolia every 6 months.  #Thyroid nodule, continue follow up with endocrinology Dr. Gabriel Carina and primary care provider.  RTC in 6 months for labs (BMP) and Prolia. Follow-up in 1 year lab MD CBC CMP LDH  CA27.29, Prolia   I discussed the assessment and treatment plan with the patient.  The patient was provided an opportunity to ask questions and all were answered.  The patient agreed with the plan and demonstrated an understanding of the instructions.  The patient was advised to call back if the symptoms worsen or if the condition fails to improve as anticipated.  Earlie Server, MD, PhD Hematology Oncology  12/29/2021

## 2021-12-30 LAB — CANCER ANTIGEN 27.29: CA 27.29: 13.1 U/mL (ref 0.0–38.6)

## 2022-01-13 ENCOUNTER — Other Ambulatory Visit: Payer: Self-pay | Admitting: Family Medicine

## 2022-01-13 DIAGNOSIS — M170 Bilateral primary osteoarthritis of knee: Secondary | ICD-10-CM

## 2022-01-20 NOTE — Progress Notes (Signed)
Error

## 2022-01-23 ENCOUNTER — Ambulatory Visit (INDEPENDENT_AMBULATORY_CARE_PROVIDER_SITE_OTHER): Payer: Medicare Other | Admitting: Urology

## 2022-01-23 ENCOUNTER — Other Ambulatory Visit: Payer: Self-pay

## 2022-01-23 DIAGNOSIS — R32 Unspecified urinary incontinence: Secondary | ICD-10-CM

## 2022-01-23 NOTE — Progress Notes (Signed)
Patient ID: Mary Todd, female   DOB: [birth year], 76 y.o.   MRN: [id number] ?PTNS ? ?Session # 31 of 33 ? ?Health & Social Factors: no change ?Caffeine: 1 ?Alcohol: 0 ?Daytime voids #per day: 3 ?Night-time voids #per night: 1 ?Urgency: mild ?Incontinence Episodes #per day: 0 ?Ankle used: left ?Treatment Setting: 4 ?Feeling/ Response: both ?Comments: pt tolerated well ? ?Performed By: Edwin Dada, [place] ? ?Follow Up: #32 of 45 ? ?

## 2022-01-29 DIAGNOSIS — Z20822 Contact with and (suspected) exposure to covid-19: Secondary | ICD-10-CM | POA: Diagnosis not present

## 2022-02-02 DIAGNOSIS — Z20822 Contact with and (suspected) exposure to covid-19: Secondary | ICD-10-CM | POA: Diagnosis not present

## 2022-02-03 ENCOUNTER — Other Ambulatory Visit: Payer: Self-pay | Admitting: Family Medicine

## 2022-02-03 DIAGNOSIS — G378 Other specified demyelinating diseases of central nervous system: Secondary | ICD-10-CM

## 2022-02-03 DIAGNOSIS — I700 Atherosclerosis of aorta: Secondary | ICD-10-CM

## 2022-02-06 ENCOUNTER — Other Ambulatory Visit: Payer: Self-pay

## 2022-02-06 ENCOUNTER — Encounter: Payer: Self-pay | Admitting: Urology

## 2022-02-06 ENCOUNTER — Ambulatory Visit: Payer: Self-pay | Admitting: Urology

## 2022-02-06 DIAGNOSIS — G378 Other specified demyelinating diseases of central nervous system: Secondary | ICD-10-CM

## 2022-02-06 DIAGNOSIS — I700 Atherosclerosis of aorta: Secondary | ICD-10-CM

## 2022-02-08 ENCOUNTER — Other Ambulatory Visit: Payer: Self-pay | Admitting: Family Medicine

## 2022-02-08 DIAGNOSIS — I10 Essential (primary) hypertension: Secondary | ICD-10-CM

## 2022-02-13 DIAGNOSIS — Z20822 Contact with and (suspected) exposure to covid-19: Secondary | ICD-10-CM | POA: Diagnosis not present

## 2022-02-14 NOTE — Progress Notes (Signed)
Name: Mary Todd   MRN: [id number]    DOB: 03-21-1946   Date:02/15/2022 ? ?     Progress Note ? ?Subjective ? ?Chief Complaint ? ?Follow Up ? ?HPI ? ?Pre-diabetes: she denies polyphagia, polydipsia or polyuria, last A1C was 5.5%, and last visit was 5.4 %, she has been off Metformin , we will recheck level  ? ?Syncopal Episode: two episodes in 2022, seen by cardiologist and work up negative - Dr. Mylo Red thought likely vasovagal episode. She also saw Dr. Manuella Ghazi - neurologist and worked up essentially normal , she had a sleep study , per [facility] chart mild OSA and may consider auto Pap 5-20 cm H2O, she will go back to see him soon to discuss results in person  ?  ?Urge incontinence: . She is under the care of Urologist, taking Detrol and seems to be improving symptoms, she is also having post tibial neuro stimulation needle electrode therapy monthly. She states nocturia down to zero r twice per nigh, down from 3-4 times. Unchanged  ?  ?HTN: she is taking  Exforge 5/160 and also metoprolol, tolerating medication well, no chest pain or palpitation. BP is at goal today it was high when she went to oncologist last month  ?  ?GERD: She is back on PPI daily but advised to try to wean by taking it every other day for weeks before going down further . Discussed risk of NSAI's use and GI bleed, she has not been able to stop Meloxicam yet . Unchanged  ?  ?OA: she had TKR right knee on Dec 4 th 2020 by Dr. Harlow Mares . She needs to take Meloxicam daily otherwise she feels very stiff, discussed importance of trying to take 7.5 mg but she has not tried yet  ?  ?History Right Breast Cancer/hodgkins lymphoma : under the care of Dr. Mike Gip  she has been released from her surgeon. . Also diagnosed with hodgkin's lymphoma in 2004, breast cancer diagnosed in 2011  she stopped Arimidex Feb 2021 , up to date with follow ups and imaging  ? ?Atherosclerosis aorta/ischemic brain disease : on recent CT abdomen, last LDL was around 100, she is  on low dose atorvastatin and we will adjust to 20 mg and recheck levels . MRI brain showed white matter ischemic brain disease. Goal LDL below 70  ? ?Fatty liver and hepatomegaly: seen by GI and was advised to try losing weight. She will be referred back for colonoscopy now  ? ?Osteoporosis: diagnosed in 2018 with AP spine score of -3.1 . She gets Prolia at the cancer center and she is tolerating medications well. We will check vitamin D level  ?  ?Thyroid nodules: incidental finding on CT neck , had biopsy by  Dr. Gabriel Carina Dec 2017  and biopsy was negative, she was last seen 05/2020 and has been released from her care . Denies dysphagia  or hair loss  ?  ?Morbid obesity: BMI is over 40 now,  she has long history of obesity, she is off  Metformin but weight has been stable. Discussed Weight Watchers and also intermittent fasting  ? ?Dysthymia: she took Citalopram for years and weaned self off in 2022. She tried Wellbutrin and it improved her fatigue but we stopped due to syncopal episode and possibility of seizures, but she has be cleared by Dr. Manuella Ghazi and is back on medication, he also added Zoloft at 25 mg but she has noticed any improvement of symptoms, we will try adjusting dose  to 50 mg daily  Normal Phq 9 but she is under stress at home , taking care of two children a 75 yo and 75 yo ( great- great niece and nephew )  and her husband that has been moody -he is now back to work part time and it has helped  ? ?Patient Active Problem List  ? Diagnosis Date Noted  ? Total knee replacement status, right 10/03/2019  ? Encounter for screening mammogram for breast cancer 07/02/2018  ? Anterolisthesis 08/06/2017  ? DDD (degenerative disc disease), lumbosacral 08/06/2017  ? Chondromalacia patellae 07/03/2017  ? Osteoporosis 12/14/2016  ? Morbid obesity ([place]) 07/17/2016  ? Spinal stenosis in cervical region 05/22/2016  ? Thyroid nodule 05/22/2016  ? GERD without esophagitis 03/16/2016  ? Neck muscle spasm 03/16/2016  ? History  of back surgery 03/16/2016  ? Osteoarthritis of both knees 03/16/2016  ? Large breasts 03/16/2016  ? Hypertension, benign 03/16/2016  ? Perennial allergic rhinitis with seasonal variation 03/16/2016  ? History of pneumococcal pneumonia 09/20/2015  ? Hodgkin disease ([place]) 07/15/2014  ? Breast cancer, right ([place]) 06/26/2013  ? ? ?Past Surgical History:  ?Procedure Laterality Date  ? ABDOMINAL HYSTERECTOMY    ? BACK SURGERY    ? BREAST BIOPSY Right 2011  ? radiation  ? BREAST LUMPECTOMY Right 2011  ? w/ radiation  ? BREAST SURGERY Right 2011  ? right breast lumpectomy,L/SN/R for right breast CA   ? CATARACT EXTRACTION W/PHACO Left 06/10/2018  ? Procedure: CATARACT EXTRACTION PHACO AND INTRAOCULAR LENS PLACEMENT ([place]) LEFT;  Surgeon: Eulogio Bear, MD;  Location: [place];  Service: Ophthalmology;  Laterality: Left;  Diabetic - oral meds  ? CATARACT EXTRACTION W/PHACO Right 07/15/2018  ? Procedure: CATARACT EXTRACTION PHACO AND INTRAOCULAR LENS PLACEMENT ([place]) RIGHT;  Surgeon: Eulogio Bear, MD;  Location: [place];  Service: Ophthalmology;  Laterality: Right;  ? COLONOSCOPY  2003  ? PORT A CATH REVISION    ? REPLACEMENT TOTAL KNEE Right 10/03/2019  ? DR. Harlow Mares   ? WRIST SURGERY  2005  ? ? ?Family History  ?Problem Relation Age of Onset  ? Cancer Father   ? Bone cancer Father   ? Breast cancer Mother 32  ? Stroke Mother   ? Colon cancer Maternal Grandmother   ? Breast cancer Cousin   ? ? ?Social History  ? ?Tobacco Use  ? Smoking status: Never  ? Smokeless tobacco: Never  ? Tobacco comments:  ?  smoking cessation materials not required  ?Substance Use Topics  ? Alcohol use: Yes  ?  Alcohol/week: 2.0 standard drinks  ?  Types: 2 Glasses of wine per week  ?  Comment: occassionally  ? ? ? ?Current Outpatient Medications:  ?  acetaminophen (TYLENOL) 650 MG CR tablet, Take 1,300 mg by mouth every 8 (eight) hours as needed for pain., Disp: , Rfl:  ?  albuterol (VENTOLIN HFA) 108 (90 Base)  MCG/ACT inhaler, Inhale 2 puffs into the lungs every 4 (four) hours as needed for wheezing or shortness of breath., Disp: 1 each, Rfl: 0 ?  amLODipine-valsartan (EXFORGE) 5-160 MG tablet, Take 1 tablet by mouth once daily, Disp: 90 tablet, Rfl: 0 ?  aspirin 81 MG tablet, Take 81 mg by mouth daily., Disp: , Rfl:  ?  atorvastatin (LIPITOR) 20 MG tablet, Take 1 tablet by mouth once daily, Disp: 90 tablet, Rfl: 0 ?  buPROPion (WELLBUTRIN XL) 150 MG 24 hr tablet, Take 1 tablet (150 mg total) by  mouth daily., Disp: 90 tablet, Rfl: 1 ?  CALCIUM-MAGNESIUM PO, Take 1 tablet by mouth daily., Disp: , Rfl:  ?  cetirizine (ZYRTEC) 10 MG tablet, Take 10 mg by mouth daily., Disp: , Rfl:  ?  Cholecalciferol (VITAMIN D-3 PO), Take 1 tablet by mouth daily., Disp: , Rfl:  ?  denosumab (PROLIA) 60 MG/ML SOSY injection, Inject 60 mg into the skin every 6 (six) months., Disp: , Rfl:  ?  gabapentin (NEURONTIN) 100 MG capsule, Take 1 capsule by mouth at bedtime, Disp: 90 capsule, Rfl: 0 ?  meloxicam (MOBIC) 15 MG tablet, Take 1 tablet by mouth once daily, Disp: 90 tablet, Rfl: 0 ?  metoprolol succinate (TOPROL-XL) 50 MG 24 hr tablet, Take 1 tablet (50 mg total) by mouth daily. Take with or immediately following a meal., Disp: 90 tablet, Rfl: 1 ?  Multiple Vitamin (MULTIVITAMIN) tablet, Take 1 tablet by mouth daily., Disp: , Rfl:  ?  NYSTATIN powder, APPLY  POWDER TOPICALLY THREE TIMES DAILY, Disp: 60 g, Rfl: 0 ?  pantoprazole (PROTONIX) 40 MG tablet, Take 1 tablet (40 mg total) by mouth daily., Disp: 90 tablet, Rfl: 0 ?  promethazine-dextromethorphan (PROMETHAZINE-DM) 6.25-15 MG/5ML syrup, Take 5 mLs by mouth 4 (four) times daily as needed for cough., Disp: 118 mL, Rfl: 0 ?  sertraline (ZOLOFT) 25 MG tablet, Take 25 mg by mouth every morning., Disp: , Rfl:  ?  Spacer/Aero-Holding Chambers (AEROCHAMBER PLUS) inhaler, Use as instructed, Disp: 1 each, Rfl: 2 ?  tolterodine (DETROL LA) 4 MG 24 hr capsule, Take 1 capsule (4 mg total) by mouth  daily., Disp: 90 capsule, Rfl: 3 ? ?Allergies  ?Allergen Reactions  ? Shrimp [Shellfish Allergy] Hives  ? Augmentin [Amoxicillin-Pot Clavulanate] Nausea Only  ? ? ?I personally reviewed active problem list,

## 2022-02-15 ENCOUNTER — Ambulatory Visit (INDEPENDENT_AMBULATORY_CARE_PROVIDER_SITE_OTHER): Payer: Medicare Other | Admitting: Family Medicine

## 2022-02-15 ENCOUNTER — Encounter: Payer: Self-pay | Admitting: Family Medicine

## 2022-02-15 VITALS — BP 122/84 | HR 91 | Resp 16 | Ht 63.0 in | Wt 234.0 lb

## 2022-02-15 DIAGNOSIS — M810 Age-related osteoporosis without current pathological fracture: Secondary | ICD-10-CM

## 2022-02-15 DIAGNOSIS — R739 Hyperglycemia, unspecified: Secondary | ICD-10-CM | POA: Diagnosis not present

## 2022-02-15 DIAGNOSIS — C8190 Hodgkin lymphoma, unspecified, unspecified site: Secondary | ICD-10-CM

## 2022-02-15 DIAGNOSIS — Z23 Encounter for immunization: Secondary | ICD-10-CM

## 2022-02-15 DIAGNOSIS — G378 Other specified demyelinating diseases of central nervous system: Secondary | ICD-10-CM | POA: Diagnosis not present

## 2022-02-15 DIAGNOSIS — I10 Essential (primary) hypertension: Secondary | ICD-10-CM

## 2022-02-15 DIAGNOSIS — Z1211 Encounter for screening for malignant neoplasm of colon: Secondary | ICD-10-CM

## 2022-02-15 DIAGNOSIS — R7303 Prediabetes: Secondary | ICD-10-CM | POA: Diagnosis not present

## 2022-02-15 DIAGNOSIS — F341 Dysthymic disorder: Secondary | ICD-10-CM | POA: Diagnosis not present

## 2022-02-15 DIAGNOSIS — L304 Erythema intertrigo: Secondary | ICD-10-CM

## 2022-02-15 DIAGNOSIS — I700 Atherosclerosis of aorta: Secondary | ICD-10-CM | POA: Diagnosis not present

## 2022-02-15 DIAGNOSIS — K219 Gastro-esophageal reflux disease without esophagitis: Secondary | ICD-10-CM

## 2022-02-15 DIAGNOSIS — I6782 Cerebral ischemia: Secondary | ICD-10-CM

## 2022-02-15 MED ORDER — BUPROPION HCL ER (XL) 150 MG PO TB24: 150.0000 mg | ORAL_TABLET | Freq: Every day | ORAL | 1 refills | Status: DC

## 2022-02-15 MED ORDER — NYSTATIN 100000 UNIT/GM EX POWD: Freq: Two times a day (BID) | CUTANEOUS | 1 refills | Status: DC

## 2022-02-15 MED ORDER — PANTOPRAZOLE SODIUM 40 MG PO TBEC: 40.0000 mg | DELAYED_RELEASE_TABLET | Freq: Every day | ORAL | 1 refills | Status: DC

## 2022-02-15 MED ORDER — METOPROLOL SUCCINATE ER 50 MG PO TB24: 50.0000 mg | ORAL_TABLET | Freq: Every day | ORAL | 1 refills | Status: DC

## 2022-02-15 MED ORDER — ATORVASTATIN CALCIUM 20 MG PO TABS: 20.0000 mg | ORAL_TABLET | Freq: Every day | ORAL | 1 refills | Status: DC

## 2022-02-15 MED ORDER — TETANUS-DIPHTH-ACELL PERTUSSIS 5-2-15.5 LF-MCG/0.5 IM SUSP: 0.5000 mL | Freq: Once | INTRAMUSCULAR | 0 refills | Status: AC

## 2022-02-15 MED ORDER — AMLODIPINE BESYLATE-VALSARTAN 5-160 MG PO TABS: 1.0000 | ORAL_TABLET | Freq: Every day | ORAL | 1 refills | Status: DC

## 2022-02-15 MED ORDER — SERTRALINE HCL 50 MG PO TABS: 50.0000 mg | ORAL_TABLET | Freq: Every morning | ORAL | 0 refills | Status: DC

## 2022-02-16 LAB — HEMOGLOBIN A1C
Hgb A1c MFr Bld: 5.8 % of total Hgb — ABNORMAL HIGH (ref <5.7)
Mean Plasma Glucose: 120 mg/dL
eAG (mmol/L): 6.6 mmol/L

## 2022-02-16 LAB — VITAMIN D 25 HYDROXY (VIT D DEFICIENCY, FRACTURES): Vit D, 25-Hydroxy: 35 ng/mL (ref 30–100)

## 2022-02-16 LAB — LIPID PANEL
Cholesterol: 152 mg/dL (ref <200)
HDL: 82 mg/dL (ref >=50)
LDL Cholesterol (Calc): 55 mg/dL (calc)
Non-HDL Cholesterol (Calc): 70 mg/dL (calc) (ref <130)
Total CHOL/HDL Ratio: 1.9 (calc) (ref <5.0)
Triglycerides: 73 mg/dL (ref <150)

## 2022-02-17 ENCOUNTER — Other Ambulatory Visit: Payer: Self-pay

## 2022-02-17 DIAGNOSIS — Z1211 Encounter for screening for malignant neoplasm of colon: Secondary | ICD-10-CM

## 2022-02-17 MED ORDER — PEG 3350-KCL-NA BICARB-NACL 420 G PO SOLR: 4000.0000 mL | Freq: Once | ORAL | 0 refills | Status: AC

## 2022-02-17 NOTE — Progress Notes (Signed)
Gastroenterology Pre-Procedure Review ? ?Request Date: Thursday 03/09/22 ?Requesting Physician: Dr. Allen Norris ? ?PATIENT REVIEW QUESTIONS: The patient responded to the following health history questions as indicated:   ? ?1. Are you having any GI issues? no ?2. Do you have a personal history of Polyps? no ?3. Do you have a family history of Colon Cancer or Polyps? no ?4. Diabetes Mellitus? no ?5. Joint replacements in the past 12 months?no ?6. Major health problems in the past 3 months?no ?7. Any artificial heart valves, MVP, or defibrillator?no ?   ?MEDICATIONS & ALLERGIES:    ?Patient reports the following regarding taking any anticoagulation/antiplatelet therapy:   ?Plavix, Coumadin, Eliquis, Xarelto, Lovenox, Pradaxa, Brilinta, or Effient? no ?Aspirin? no ? ?Patient confirms/reports the following medications:  ?Current Outpatient Medications  ?Medication Sig Dispense Refill  ? acetaminophen (TYLENOL) 650 MG CR tablet Take 1,300 mg by mouth every 8 (eight) hours as needed for pain.    ? amLODipine-valsartan (EXFORGE) 5-160 MG tablet Take 1 tablet by mouth daily. 90 tablet 1  ? aspirin 81 MG tablet Take 81 mg by mouth daily.    ? atorvastatin (LIPITOR) 20 MG tablet Take 1 tablet (20 mg total) by mouth daily. 90 tablet 1  ? buPROPion (WELLBUTRIN XL) 150 MG 24 hr tablet Take 1 tablet (150 mg total) by mouth daily. 90 tablet 1  ? CALCIUM-MAGNESIUM PO Take 1 tablet by mouth daily.    ? cetirizine (ZYRTEC) 10 MG tablet Take 10 mg by mouth daily.    ? Cholecalciferol (VITAMIN D-3 PO) Take 1 tablet by mouth daily.    ? denosumab (PROLIA) 60 MG/ML SOSY injection Inject 60 mg into the skin every 6 (six) months.    ? gabapentin (NEURONTIN) 100 MG capsule Take 1 capsule by mouth at bedtime 90 capsule 0  ? meloxicam (MOBIC) 15 MG tablet Take 1 tablet by mouth once daily 90 tablet 0  ? metoprolol succinate (TOPROL-XL) 50 MG 24 hr tablet Take 1 tablet (50 mg total) by mouth daily. Take with or immediately following a meal. 90 tablet  1  ? Multiple Vitamin (MULTIVITAMIN) tablet Take 1 tablet by mouth daily.    ? nystatin powder Apply topically 2 (two) times daily. 60 g 1  ? pantoprazole (PROTONIX) 40 MG tablet Take 1 tablet (40 mg total) by mouth daily. 90 tablet 1  ? sertraline (ZOLOFT) 50 MG tablet Take 1 tablet (50 mg total) by mouth every morning. 90 tablet 0  ? tolterodine (DETROL LA) 4 MG 24 hr capsule Take 1 capsule (4 mg total) by mouth daily. 90 capsule 3  ? ?No current facility-administered medications for this visit.  ? ? ?Patient confirms/reports the following allergies:  ?Allergies  ?Allergen Reactions  ? Shrimp [Shellfish Allergy] Hives  ? Augmentin [Amoxicillin-Pot Clavulanate] Nausea Only  ? ? ?No orders of the defined types were placed in this encounter. ? ? ?AUTHORIZATION INFORMATION ?Primary Insurance: ?1D#: ?Group #: ? ?Secondary Insurance: ?1D#: ?Group #: ? ?SCHEDULE INFORMATION: ?Date: Thursday 03/09/22 ?Time: ?Location: [place] ?

## 2022-02-20 ENCOUNTER — Ambulatory Visit (INDEPENDENT_AMBULATORY_CARE_PROVIDER_SITE_OTHER): Payer: Medicare Other | Admitting: Urology

## 2022-02-20 DIAGNOSIS — R32 Unspecified urinary incontinence: Secondary | ICD-10-CM

## 2022-02-20 DIAGNOSIS — R350 Frequency of micturition: Secondary | ICD-10-CM | POA: Diagnosis not present

## 2022-02-20 NOTE — Progress Notes (Signed)
Patient ID: Mary Todd, female   DOB: 05-01-1946, 76 y.o.   MRN: [id number] ?PTNS ? ?Session # 32 of 13 ? ?Health & Social Factors: no change ?Caffeine: 1 ?Alcohol: 0 ?Daytime voids #per day: 3 ?Night-time voids #per night: 1 ?Urgency: mild ?Incontinence Episodes #per day: 0 ?Ankle used: left ?Treatment Setting: 3 ?Feeling/ Response: sensory ?Comments: pt tolerated well ? ?Performed By: Edwin Dada, Guadalupe ? ?Follow Up: 1 mth for # 33 of 45  ?

## 2022-02-22 DIAGNOSIS — H35051 Retinal neovascularization, unspecified, right eye: Secondary | ICD-10-CM | POA: Diagnosis not present

## 2022-03-02 ENCOUNTER — Other Ambulatory Visit: Payer: Self-pay

## 2022-03-02 ENCOUNTER — Encounter: Payer: Self-pay | Admitting: Gastroenterology

## 2022-03-04 DIAGNOSIS — Z20822 Contact with and (suspected) exposure to covid-19: Secondary | ICD-10-CM | POA: Diagnosis not present

## 2022-03-06 DIAGNOSIS — Z20822 Contact with and (suspected) exposure to covid-19: Secondary | ICD-10-CM | POA: Diagnosis not present

## 2022-03-09 ENCOUNTER — Encounter
Admission: RE | Disposition: A | Discharge status: Home / Self Care | Payer: Self-pay | Source: Home / Self Care | Attending: Gastroenterology

## 2022-03-09 ENCOUNTER — Other Ambulatory Visit: Payer: Self-pay

## 2022-03-09 ENCOUNTER — Ambulatory Visit
Admission: RE | Admit: 2022-03-09 | Discharge: 2022-03-09 | Disposition: A | Discharge status: Home / Self Care | Payer: Medicare Other | Attending: Gastroenterology | Admitting: Gastroenterology

## 2022-03-09 ENCOUNTER — Encounter: Payer: Self-pay | Admitting: Gastroenterology

## 2022-03-09 ENCOUNTER — Ambulatory Visit: Payer: Medicare Other | Admitting: Anesthesiology

## 2022-03-09 DIAGNOSIS — D126 Benign neoplasm of colon, unspecified: Secondary | ICD-10-CM | POA: Diagnosis not present

## 2022-03-09 DIAGNOSIS — K5730 Diverticulosis of large intestine without perforation or abscess without bleeding: Secondary | ICD-10-CM | POA: Diagnosis not present

## 2022-03-09 DIAGNOSIS — I10 Essential (primary) hypertension: Secondary | ICD-10-CM | POA: Insufficient documentation

## 2022-03-09 DIAGNOSIS — D124 Benign neoplasm of descending colon: Secondary | ICD-10-CM | POA: Insufficient documentation

## 2022-03-09 DIAGNOSIS — K219 Gastro-esophageal reflux disease without esophagitis: Secondary | ICD-10-CM | POA: Insufficient documentation

## 2022-03-09 DIAGNOSIS — Z1211 Encounter for screening for malignant neoplasm of colon: Secondary | ICD-10-CM | POA: Diagnosis not present

## 2022-03-09 DIAGNOSIS — K635 Polyp of colon: Secondary | ICD-10-CM

## 2022-03-09 HISTORY — PX: COLONOSCOPY: SHX5424

## 2022-03-09 HISTORY — PX: POLYPECTOMY: SHX5525

## 2022-03-09 SURGERY — COLONOSCOPY
Anesthesia: General | Site: Rectum

## 2022-03-09 MED ORDER — LIDOCAINE HCL (CARDIAC) PF 100 MG/5ML IV SOSY
PREFILLED_SYRINGE | INTRAVENOUS | Status: DC | PRN
  Administered 2022-03-09: 30 mg via INTRAVENOUS

## 2022-03-09 MED ORDER — LABETALOL HCL 5 MG/ML IV SOLN
INTRAVENOUS | Status: DC | PRN
  Administered 2022-03-09: 5 mg via INTRAVENOUS

## 2022-03-09 MED ORDER — PROPOFOL 10 MG/ML IV BOLUS
INTRAVENOUS | Status: DC | PRN
Start: 2022-03-09 — End: 2022-03-09
  Administered 2022-03-09 (×5): 30 mg via INTRAVENOUS
  Administered 2022-03-09 (×2): 20 mg via INTRAVENOUS
  Administered 2022-03-09 (×5): 30 mg via INTRAVENOUS
  Administered 2022-03-09: 100 mg via INTRAVENOUS

## 2022-03-09 MED ORDER — ACETAMINOPHEN 325 MG PO TABS: 325.0000 mg | ORAL_TABLET | ORAL | Status: DC | PRN

## 2022-03-09 MED ORDER — ACETAMINOPHEN 160 MG/5ML PO SOLN: 325.0000 mg | ORAL | Status: DC | PRN

## 2022-03-09 MED ORDER — LACTATED RINGERS IV SOLN: INTRAVENOUS | Status: DC

## 2022-03-09 MED ORDER — STERILE WATER FOR IRRIGATION IR SOLN
Status: DC | PRN
  Administered 2022-03-09: 100 mL

## 2022-03-09 MED ORDER — ONDANSETRON HCL 4 MG/2ML IJ SOLN
4.0000 mg | Freq: Once | INTRAMUSCULAR | Status: AC | PRN
  Administered 2022-03-09: 4 mg via INTRAVENOUS

## 2022-03-09 MED ORDER — SODIUM CHLORIDE 0.9 % IV SOLN: INTRAVENOUS | Status: DC

## 2022-03-09 SURGICAL SUPPLY — 7 items
FORCEPS BIOP RAD 4 LRG CAP 4 (CUTTING FORCEPS) ×1 IMPLANT
GOWN CVR UNV OPN BCK APRN NK (MISCELLANEOUS) ×2 IMPLANT
GOWN ISOL THUMB LOOP REG UNIV (MISCELLANEOUS) ×4
KIT PRC NS LF DISP ENDO (KITS) ×1 IMPLANT
KIT PROCEDURE OLYMPUS (KITS) ×2
MANIFOLD NEPTUNE II (INSTRUMENTS) ×2 IMPLANT
WATER STERILE IRR 250ML POUR (IV SOLUTION) ×2 IMPLANT

## 2022-03-09 NOTE — Anesthesia Postprocedure Evaluation (Signed)
Anesthesia Post Note ? ?Patient: Mary Todd ? ?Procedure(s) Performed: COLONOSCOPY WITH BIOPSY (Rectum) ?POLYPECTOMY (Rectum) ? ? ?  ?Patient location during evaluation: PACU ?Anesthesia Type: General ?Level of consciousness: awake and alert ?Pain management: pain level controlled ?Vital Signs Assessment: post-procedure vital signs reviewed and stable ?Respiratory status: spontaneous breathing, nonlabored ventilation, respiratory function stable and patient connected to nasal cannula oxygen ?Cardiovascular status: blood pressure returned to baseline and stable ?Postop Assessment: no apparent nausea or vomiting ?Anesthetic complications: no ? ? ?No notable events documented. ? ?Torii Royse ELAINE ? ? ? ? ? ?

## 2022-03-09 NOTE — H&P (Signed)
? ?Lucilla Lame, MD [facility] ?[address] ?[place],  [address] ?Phone: [phone] ?Fax : [phone] ? ?Primary Care Physician:  Steele Sizer, MD ?Primary Gastroenterologist:  Dr. Allen Norris ? ?Pre-Procedure History & Physical: ?HPI:  Mary Todd is a 76 y.o. female is here for a screening colonoscopy.  ? ?Past Medical History:  ?Diagnosis Date  ? Allergy   ? Arthritis   ? knees, lower back  ? Cancer [facility])   ? Breast cancer  ? Esophageal reflux   ? Hypertension 2006  ? Osteoporosis   ? Personal history of malignant neoplasm of breast 2011  ? right breast  ? Personal history of radiation therapy 2011  ? RIGHT lumpectomy w/ radiation  ? ? ?Past Surgical History:  ?Procedure Laterality Date  ? ABDOMINAL HYSTERECTOMY    ? BACK SURGERY    ? BREAST BIOPSY Right 2011  ? radiation  ? BREAST LUMPECTOMY Right 2011  ? w/ radiation  ? BREAST SURGERY Right 2011  ? right breast lumpectomy,L/SN/R for right breast CA   ? CATARACT EXTRACTION W/PHACO Left 06/10/2018  ? Procedure: CATARACT EXTRACTION PHACO AND INTRAOCULAR LENS PLACEMENT ([place]) LEFT;  Surgeon: Eulogio Bear, MD;  Location: [place];  Service: Ophthalmology;  Laterality: Left;  Diabetic - oral meds  ? CATARACT EXTRACTION W/PHACO Right 07/15/2018  ? Procedure: CATARACT EXTRACTION PHACO AND INTRAOCULAR LENS PLACEMENT ([place]) RIGHT;  Surgeon: Eulogio Bear, MD;  Location: [place];  Service: Ophthalmology;  Laterality: Right;  ? COLONOSCOPY  2003  ? PORT A CATH REVISION    ? REPLACEMENT TOTAL KNEE Right 10/03/2019  ? DR. Harlow Mares   ? WRIST SURGERY  2005  ? ? ?Prior to Admission medications   ?Medication Sig Start Date End Date Taking? Authorizing Provider  ?acetaminophen (TYLENOL) 650 MG CR tablet Take 1,300 mg by mouth every 8 (eight) hours as needed for pain.   Yes [provider]  ?amLODipine-valsartan (EXFORGE) 5-160 MG tablet Take 1 tablet by mouth daily. 02/15/22  Yes Sowles, Drue Stager, MD  ?atorvastatin  (LIPITOR) 20 MG tablet Take 1 tablet (20 mg total) by mouth daily. 02/15/22  Yes Sowles, Drue Stager, MD  ?buPROPion (WELLBUTRIN XL) 150 MG 24 hr tablet Take 1 tablet (150 mg total) by mouth daily. 02/15/22  Yes Sowles, Drue Stager, MD  ?CALCIUM-MAGNESIUM PO Take 1 tablet by mouth daily.   Yes [provider]  ?cetirizine (ZYRTEC) 10 MG tablet Take 10 mg by mouth daily.   Yes [provider]  ?Cholecalciferol (VITAMIN D-3 PO) Take 1 tablet by mouth daily.   Yes [provider]  ?gabapentin (NEURONTIN) 100 MG capsule Take 1 capsule by mouth at bedtime 12/14/21  Yes Edrick Kins, DPM  ?meloxicam (MOBIC) 15 MG tablet Take 1 tablet by mouth once daily 01/13/22  Yes Sowles, Drue Stager, MD  ?metoprolol succinate (TOPROL-XL) 50 MG 24 hr tablet Take 1 tablet (50 mg total) by mouth daily. Take with or immediately following a meal. 02/15/22  Yes Sowles, Drue Stager, MD  ?Multiple Vitamin (MULTIVITAMIN) tablet Take 1 tablet by mouth daily.   Yes [provider]  ?pantoprazole (PROTONIX) 40 MG tablet Take 1 tablet (40 mg total) by mouth daily. 02/15/22  Yes Sowles, Drue Stager, MD  ?sertraline (ZOLOFT) 50 MG tablet Take 1 tablet (50 mg total) by mouth every morning. 02/15/22  Yes Sowles, Drue Stager, MD  ?tolterodine (DETROL LA) 4 MG 24 hr capsule Take 1 capsule (4 mg total) by mouth daily. 01/31/21  Yes Bjorn Loser, MD  ?  aspirin 81 MG tablet Take 81 mg by mouth daily. ?Patient not taking: Reported on 03/02/2022    [provider]  ?denosumab (PROLIA) 60 MG/ML SOSY injection Inject 60 mg into the skin every 6 (six) months.    Lequita Asal, MD  ?nystatin powder Apply topically 2 (two) times daily. ?Patient taking differently: Apply topically 2 (two) times daily as needed. 02/15/22   Steele Sizer, MD  ? ? ?Allergies as of 02/17/2022 - Review Complete 02/15/2022  ?Allergen Reaction Noted  ? Shrimp [shellfish allergy] Hives 05/30/2018  ? Augmentin [amoxicillin-pot clavulanate] Nausea Only 08/04/2015   ? ? ?Family History  ?Problem Relation Age of Onset  ? Cancer Father   ? Bone cancer Father   ? Breast cancer Mother 46  ? Stroke Mother   ? Colon cancer Maternal Grandmother   ? Breast cancer Cousin   ? ? ?Social History  ? ?Socioeconomic History  ? Marital status: Married  ?  Spouse name: [name]  ? Number of children: 2  ? Years of education: some college  ? Highest education level: 12th grade  ?Occupational History  ? Occupation: Retired  ?Tobacco Use  ? Smoking status: Never  ? Smokeless tobacco: Never  ? Tobacco comments:  ?  smoking cessation materials not required  ?Vaping Use  ? Vaping Use: Never used  ?Substance and Sexual Activity  ? Alcohol use: Not Currently  ?  Comment: occasionally  ? Drug use: No  ? Sexual activity: Not Currently  ?Other Topics Concern  ? Not on file  ?Social History Narrative  ? Not on file  ? ?Social Determinants of Health  ? ?Financial Resource Strain: Low Risk   ? Difficulty of Paying Living Expenses: Not hard at all  ?Food Insecurity: No Food Insecurity  ? Worried About Charity fundraiser in the Last Year: Never true  ? Ran Out of Food in the Last Year: Never true  ?Transportation Needs: No Transportation Needs  ? Lack of Transportation (Medical): No  ? Lack of Transportation (Non-Medical): No  ?Physical Activity: Insufficiently Active  ? Days of Exercise per Week: 2 days  ? Minutes of Exercise per Session: 30 min  ?Stress: No Stress Concern Present  ? Feeling of Stress : Not at all  ?Social Connections: Moderately Integrated  ? Frequency of Communication with Friends and Family: More than three times a week  ? Frequency of Social Gatherings with Friends and Family: Three times a week  ? Attends Religious Services: More than 4 times per year  ? Active Member of Clubs or Organizations: No  ? Attends Archivist Meetings: Never  ? Marital Status: Married  ?Intimate Partner Violence: Not At Risk  ? Fear of Current or Ex-Partner: No  ? Emotionally Abused: No  ? Physically  Abused: No  ? Sexually Abused: No  ? ? ?Review of Systems: ?See HPI, otherwise negative ROS ? ?Physical Exam: ?BP (!) 135/93   Pulse 91   Temp 97.9 ?F (36.6 ?C) (Temporal)   Ht '5\' 3"'$  (1.6 m)   Wt 103.4 kg   SpO2 95%   BMI 40.39 kg/m?  ?General:   Alert,  pleasant and cooperative in NAD ?Head:  Normocephalic and atraumatic. ?Neck:  Supple; no masses or thyromegaly. ?Lungs:  Clear throughout to auscultation.    ?Heart:  Regular rate and rhythm. ?Abdomen:  Soft, nontender and nondistended. Normal bowel sounds, without guarding, and without rebound.   ?Neurologic:  Alert and  oriented x4;  grossly normal neurologically. ? ?Impression/Plan: ?Mary Todd is now here to undergo a screening colonoscopy. ? ?Risks, benefits, and alternatives regarding colonoscopy have been reviewed with the patient.  Questions have been answered.  All parties agreeable. ?

## 2022-03-09 NOTE — Transfer of Care (Signed)
Immediate Anesthesia Transfer of Care Note ? ?Patient: Mary Todd ? ?Procedure(s) Performed: COLONOSCOPY WITH BIOPSY (Rectum) ?POLYPECTOMY (Rectum) ? ?Patient Location: PACU ? ?Anesthesia Type: General ? ?Level of Consciousness: awake, alert  and patient cooperative ? ?Airway and Oxygen Therapy: Patient Spontanous Breathing and Patient connected to supplemental oxygen ? ?Post-op Assessment: Post-op Vital signs reviewed, Patient's Cardiovascular Status Stable, Respiratory Function Stable, Patent Airway and No signs of Nausea or vomiting ? ?Post-op Vital Signs: Reviewed and stable ? ?Complications: No notable events documented. ? ?

## 2022-03-09 NOTE — Op Note (Signed)
[facility] ?Gastroenterology ?Patient Name: Mary Todd ?Procedure Date: 03/09/2022 8:37 AM ?MRN: [id number] ?Account #: [id number] ?Date of Birth: Nov 23, 1945 ?Admit Type: Outpatient ?Age: 76 ?Room: [facility] OR ROOM 01 ?Gender: Female ?Note Status: Finalized ?Instrument Name: [id number] ?Procedure:             Colonoscopy ?Indications:           Screening for colorectal malignant neoplasm ?Providers:             Lucilla Lame MD, MD ?Referring MD:          Bethena Roys. Ancil Boozer, MD (Referring MD) ?Medicines:             Propofol per Anesthesia ?Complications:         No immediate complications. ?Procedure:             Pre-Anesthesia Assessment: ?                       - Prior to the procedure, a History and Physical was  ?                       performed, and patient medications and allergies were  ?                       reviewed. The patient's tolerance of previous  ?                       anesthesia was also reviewed. The risks and benefits  ?                       of the procedure and the sedation options and risks  ?                       were discussed with the patient. All questions were  ?                       answered, and informed consent was obtained. Prior  ?                       Anticoagulants: The patient has taken no previous  ?                       anticoagulant or antiplatelet agents. ASA Grade  ?                       Assessment: II - A patient with mild systemic disease.  ?                       After reviewing the risks and benefits, the patient  ?                       was deemed in satisfactory condition to undergo the  ?                       procedure. ?                       After obtaining informed consent, the colonoscope was  ?  passed under direct vision. Throughout the procedure,  ?                       the patient's blood pressure, pulse, and oxygen  ?                       saturations were monitored continuously. The  ?                       Colonoscope  was introduced through the anus and  ?                       advanced to the the cecum, identified by appendiceal  ?                       orifice and ileocecal valve. The colonoscopy was  ?                       performed without difficulty. The patient tolerated  ?                       the procedure well. The quality of the bowel  ?                       preparation was excellent. ?Findings: ?     The perianal and digital rectal examinations were normal. ?     Two sessile polyps were found in the transverse colon. The polyps were 3  ?     to 4 mm in size. These polyps were removed with a cold biopsy forceps.  ?     Resection and retrieval were complete. ?     A few small-mouthed diverticula were found in the entire colon. ?Impression:            - Two 3 to 4 mm polyps in the transverse colon,  ?                       removed with a cold biopsy forceps. Resected and  ?                       retrieved. ?                       - Diverticulosis in the entire examined colon. ?Recommendation:        - Discharge patient to home. ?                       - Resume previous diet. ?                       - Continue present medications. ?                       - Await pathology results. ?                       - Repeat colonoscopy is not recommended for  ?                       surveillance. ?Procedure Code(s):     --- Professional --- ?  41962, Colonoscopy, flexible; with biopsy, single or  ?                       multiple ?Diagnosis Code(s):     --- Professional --- ?                       Z12.11, Encounter for screening for malignant neoplasm  ?                       of colon ?                       K63.5, Polyp of colon ?CPT copyright 2019 American Medical Association. All rights reserved. ?The codes documented in this report are preliminary and upon coder review may  ?be revised to meet current compliance requirements. ?Lucilla Lame MD, MD ?03/09/2022 9:10:53 AM ?This report has been signed  electronically. ?Number of Addenda: 0 ?Note Initiated On: 03/09/2022 8:37 AM ?Scope Withdrawal Time: 0 hours 10 minutes 27 seconds  ?Total Procedure Duration: 0 hours 17 minutes 25 seconds  ?Estimated Blood Loss:  Estimated blood loss: none. ?     [facility] ?

## 2022-03-09 NOTE — Anesthesia Preprocedure Evaluation (Signed)
Anesthesia Evaluation  ?Patient identified by MRN, date of birth, ID band ?Patient awake ? ? ? ?Reviewed: ?Allergy & Precautions, H&P , NPO status , Patient's Chart, lab work & pertinent test results, reviewed documented beta blocker date and time  ? ?Airway ?Mallampati: II ? ?TM Distance: >3 FB ?Neck ROM: full ? ? ? Dental ?no notable dental hx. ? ?  ?Pulmonary ?neg pulmonary ROS,  ?  ?Pulmonary exam normal ?breath sounds clear to auscultation ? ? ? ? ? ? Cardiovascular ?Exercise Tolerance: Good ?hypertension, negative cardio ROS ? ? ?Rhythm:regular Rate:Normal ? ? ?  ?Neuro/Psych ?negative neurological ROS ? negative psych ROS  ? GI/Hepatic ?negative GI ROS, Neg liver ROS, GERD  ,  ?Endo/Other  ?negative endocrine ROS ? Renal/GU ?negative Renal ROS  ?negative genitourinary ?  ?Musculoskeletal ? ? Abdominal ?  ?Peds ? Hematology ?negative hematology ROS ?(+)   ?Anesthesia Other Findings ? ? Reproductive/Obstetrics ?negative OB ROS ? ?  ? ? ? ? ? ? ? ? ? ? ? ? ? ?  ?  ? ? ? ? ? ? ? ? ?Anesthesia Physical ?Anesthesia Plan ? ?ASA: 2 ? ?Anesthesia Plan: General  ? ?Post-op Pain Management:   ? ?Induction:  ? ?PONV Risk Score and Plan: 3 and Propofol infusion and Treatment may vary due to age or medical condition ? ?Airway Management Planned:  ? ?Additional Equipment:  ? ?Intra-op Plan:  ? ?Post-operative Plan:  ? ?Informed Consent: I have reviewed the patients History and Physical, chart, labs and discussed the procedure including the risks, benefits and alternatives for the proposed anesthesia with the patient or authorized representative who has indicated his/her understanding and acceptance.  ? ? ? ?Dental Advisory Given ? ?Plan Discussed with: CRNA ? ?Anesthesia Plan Comments:   ? ? ? ? ? ? ?Anesthesia Quick Evaluation ? ?

## 2022-03-09 NOTE — Anesthesia Procedure Notes (Signed)
Date/Time: 03/09/2022 8:47 AM ?Performed by: Cameron Ali, CRNA ?Pre-anesthesia Checklist: Patient identified, Emergency Drugs available, Suction available, Timeout performed and Patient being monitored ?Patient Re-evaluated:Patient Re-evaluated prior to induction ?Oxygen Delivery Method: Nasal cannula ?Placement Confirmation: positive ETCO2 ? ? ? ? ?

## 2022-03-10 ENCOUNTER — Encounter: Payer: Self-pay | Admitting: Gastroenterology

## 2022-03-13 LAB — SURGICAL PATHOLOGY

## 2022-03-15 ENCOUNTER — Encounter: Payer: Self-pay | Admitting: Gastroenterology

## 2022-03-20 ENCOUNTER — Encounter: Payer: Self-pay | Admitting: Urology

## 2022-03-20 ENCOUNTER — Ambulatory Visit (INDEPENDENT_AMBULATORY_CARE_PROVIDER_SITE_OTHER): Payer: Medicare Other | Admitting: Urology

## 2022-03-20 DIAGNOSIS — R350 Frequency of micturition: Secondary | ICD-10-CM

## 2022-03-20 NOTE — Progress Notes (Unsigned)
Patient ID: Mary Todd, female   DOB: 08-28-1946, 76 y.o.   MRN: [id number] PTNS  Session # 33 of 45  Health & Social Factors: no change Caffeine: 1 Alcohol: 0 Daytime voids #per day: 3 Night-time voids #per night: 1 Urgency: mild Incontinence Episodes #per day: 0 Ankle used: right Treatment Setting: 4 Feeling/ Response: both Comments: pt tolerated well  Performed By: Edwin Dada, CMA  Follow Up: 1 mth for 34 of 45

## 2022-03-23 ENCOUNTER — Other Ambulatory Visit: Payer: Self-pay | Admitting: Podiatry

## 2022-04-06 ENCOUNTER — Ambulatory Visit (INDEPENDENT_AMBULATORY_CARE_PROVIDER_SITE_OTHER): Payer: Medicare Other

## 2022-04-06 DIAGNOSIS — Z0000 Encounter for general adult medical examination without abnormal findings: Secondary | ICD-10-CM

## 2022-04-06 NOTE — Progress Notes (Signed)
Subjective:   Mary Todd is a 76 y.o. female who presents for Medicare Annual (Subsequent) preventive examination.  Virtual Visit via Telephone Note  I connected with  Kerrigan Gombos Moger on 04/06/22 at  3:30 PM EDT by telephone and verified that I am speaking with the correct person using two identifiers.  Location: Patient: home Provider: [place] Persons participating in the virtual visit: [place]   I discussed the limitations, risks, security and privacy concerns of performing an evaluation and management service by telephone and the availability of in person appointments. The patient expressed understanding and agreed to proceed.  Interactive audio and video telecommunications were attempted between this nurse and patient, however failed, due to patient having technical difficulties OR patient did not have access to video capability.  We continued and completed visit with audio only.  Some vital signs may be absent or patient reported.   Clemetine Marker, LPN   Review of Systems     Cardiac Risk Factors include: advanced age (>30mn, >>78women);hypertension;obesity (BMI >30kg/m2);dyslipidemia     Objective:    There were no vitals filed for this visit. There is no height or weight on file to calculate BMI.     04/06/2022    3:40 PM 03/09/2022    8:00 AM 12/29/2021    1:25 PM 12/07/2021   11:01 AM 04/14/2021    2:35 PM 03/31/2021    3:42 PM 01/01/2020   10:14 AM  Advanced Directives  Does Patient Have a Medical Advance Directive? No No No No No No No  Does patient want to make changes to medical advance directive? No - Patient declined        Would patient like information on creating a medical advance directive?  No - Patient declined   No - Patient declined Yes (MAU/Ambulatory/Procedural Areas - Information given) No - Patient declined    Current Medications (verified) Outpatient Encounter Medications as of 04/06/2022  Medication Sig    acetaminophen (TYLENOL) 650 MG CR tablet Take 1,300 mg by mouth every 8 (eight) hours as needed for pain.   amLODipine-valsartan (EXFORGE) 5-160 MG tablet Take 1 tablet by mouth daily.   atorvastatin (LIPITOR) 20 MG tablet Take 1 tablet (20 mg total) by mouth daily.   buPROPion (WELLBUTRIN XL) 150 MG 24 hr tablet Take 1 tablet (150 mg total) by mouth daily.   CALCIUM-MAGNESIUM PO Take 1 tablet by mouth daily.   cetirizine (ZYRTEC) 10 MG tablet Take 10 mg by mouth daily.   Cholecalciferol (VITAMIN D-3 PO) Take 1 tablet by mouth daily.   denosumab (PROLIA) 60 MG/ML SOSY injection Inject 60 mg into the skin every 6 (six) months.   meloxicam (MOBIC) 15 MG tablet Take 1 tablet by mouth once daily   metoprolol succinate (TOPROL-XL) 50 MG 24 hr tablet Take 1 tablet (50 mg total) by mouth daily. Take with or immediately following a meal.   Multiple Vitamin (MULTIVITAMIN) tablet Take 1 tablet by mouth daily.   nystatin powder Apply topically 2 (two) times daily.   pantoprazole (PROTONIX) 40 MG tablet Take 1 tablet (40 mg total) by mouth daily.   sertraline (ZOLOFT) 50 MG tablet Take 1 tablet (50 mg total) by mouth every morning.   tolterodine (DETROL LA) 4 MG 24 hr capsule Take 1 capsule (4 mg total) by mouth daily.   gabapentin (NEURONTIN) 100 MG capsule Take 1 capsule by mouth at bedtime (Patient not taking: Reported on 04/06/2022)   No facility-administered encounter medications  on file as of 04/06/2022.    Allergies (verified) Shrimp [shellfish allergy] and Augmentin [amoxicillin-pot clavulanate]   History: Past Medical History:  Diagnosis Date   Allergy    Arthritis    knees, lower back   Cancer [facility])    Breast cancer   Esophageal reflux    Hypertension 2006   Osteoporosis    Personal history of malignant neoplasm of breast 2011   right breast   Personal history of radiation therapy 2011   RIGHT lumpectomy w/ radiation   Past Surgical History:  Procedure Laterality Date   ABDOMINAL  HYSTERECTOMY     BACK SURGERY     BREAST BIOPSY Right 2011   radiation   BREAST LUMPECTOMY Right 2011   w/ radiation   BREAST SURGERY Right 2011   right breast lumpectomy,L/SN/R for right breast CA    CATARACT EXTRACTION W/PHACO Left 06/10/2018   Procedure: CATARACT EXTRACTION PHACO AND INTRAOCULAR LENS PLACEMENT ([place]) LEFT;  Surgeon: Eulogio Bear, MD;  Location: [place];  Service: Ophthalmology;  Laterality: Left;  Diabetic - oral meds   CATARACT EXTRACTION W/PHACO Right 07/15/2018   Procedure: CATARACT EXTRACTION PHACO AND INTRAOCULAR LENS PLACEMENT ([place]) RIGHT;  Surgeon: Eulogio Bear, MD;  Location: [place];  Service: Ophthalmology;  Laterality: Right;   COLONOSCOPY  2003   COLONOSCOPY N/A 03/09/2022   Procedure: COLONOSCOPY WITH BIOPSY;  Surgeon: Lucilla Lame, MD;  Location: [place];  Service: Endoscopy;  Laterality: N/A;   POLYPECTOMY N/A 03/09/2022   Procedure: POLYPECTOMY;  Surgeon: Lucilla Lame, MD;  Location: [place];  Service: Endoscopy;  Laterality: N/A;   PORT A CATH REVISION     REPLACEMENT TOTAL KNEE Right 10/03/2019   DR. Stowell  2005   Family History  Problem Relation Age of Onset   Cancer Father    Bone cancer Father    Breast cancer Mother 47   Stroke Mother    Colon cancer Maternal Grandmother    Breast cancer Cousin    Social History   Socioeconomic History   Marital status: Married    Spouse name: [name]   Number of children: 2   Years of education: some college   Highest education level: 12th grade  Occupational History   Occupation: Retired  Tobacco Use   Smoking status: Never   Smokeless tobacco: Never   Tobacco comments:    smoking cessation materials not required  Vaping Use   Vaping Use: Never used  Substance and Sexual Activity   Alcohol use: Yes    Comment: occasionally   Drug use: No   Sexual activity: Not Currently  Other Topics Concern   Not on file  Social  History Narrative   Not on file   Social Determinants of Health   Financial Resource Strain: Low Risk  (04/06/2022)   Overall Financial Resource Strain (CARDIA)    Difficulty of Paying Living Expenses: Not hard at all  Food Insecurity: No Food Insecurity (04/06/2022)   Hunger Vital Sign    Worried About Running Out of Food in the Last Year: Never true    [place] in the Last Year: Never true  Transportation Needs: No Transportation Needs (04/06/2022)   PRAPARE - Hydrologist (Medical): No    Lack of Transportation (Non-Medical): No  Physical Activity: Insufficiently Active (04/06/2022)   Exercise Vital Sign    Days of Exercise per Week: 3 days  Minutes of Exercise per Session: 20 min  Stress: No Stress Concern Present (04/06/2022)   [place]    Feeling of Stress : Not at all  Social Connections: Moderately Integrated (04/06/2022)   Social Connection and Isolation Panel [NHANES]    Frequency of Communication with Friends and Family: More than three times a week    Frequency of Social Gatherings with Friends and Family: Three times a week    Attends Religious Services: More than 4 times per year    Active Member of Clubs or Organizations: No    Attends Archivist Meetings: Never    Marital Status: Married    Tobacco Counseling Counseling given: Not Answered Tobacco comments: smoking cessation materials not required   Clinical Intake:  Pre-visit preparation completed: Yes  Pain : No/denies pain     Nutritional Risks: None Diabetes: No  How often do you need to have someone help you when you read instructions, pamphlets, or other written materials from your doctor or pharmacy?: 1 - Never    Interpreter Needed?: No  Information entered by :: Clemetine Marker LPN   Activities of Daily Living    04/06/2022    3:41 PM 03/09/2022    7:52 AM  In your present state of  health, do you have any difficulty performing the following activities:  Hearing? 0 0  Vision? 0 0  Difficulty concentrating or making decisions? 0 0  Walking or climbing stairs? 0 0  Dressing or bathing? 0 0  Doing errands, shopping? 0   Preparing Food and eating ? N   Using the Toilet? N   In the past six months, have you accidently leaked urine? N   Do you have problems with loss of bowel control? N   Managing your Medications? N   Managing your Finances? N   Housekeeping or managing your Housekeeping? N     Patient Care Team: Steele Sizer, MD as PCP - General (Family Medicine) Solum, Betsey Holiday, MD as Consulting Physician (Endocrinology) Laneta Simmers as Physician Assistant (Urology) Edrick Kins, DPM as Consulting Physician (Podiatry) Lovell Sheehan, MD as Consulting Physician (Orthopedic Surgery) Earlie Server, MD as Consulting Physician (Oncology) Kate Sable, MD as Consulting Physician (Cardiology)  Indicate any recent Medical Services you may have received from other than [facility] providers in the past year (date may be approximate).     Assessment:   This is a routine wellness examination for E[place]y.  Hearing/Vision screen Hearing Screening - Comments:: Pt denies hearing difficulty Vision Screening - Comments:: Annual vision screenings at [facility] issues and exercise activities discussed: Current Exercise Habits: Home exercise routine, Type of exercise: walking, Time (Minutes): 20, Frequency (Times/Week): 3, Weekly Exercise (Minutes/Week): 60, Intensity: Mild, Exercise limited by: None identified   Goals Addressed   None    Depression Screen    04/06/2022    3:39 PM 02/15/2022    9:42 AM 11/03/2021    3:26 PM 10/11/2021    9:47 AM 08/19/2021    8:45 AM 07/08/2021   10:50 AM 03/31/2021    3:41 PM  PHQ 2/9 Scores  PHQ - 2 Score 0 0 0 0 0 0 0  PHQ- 9 Score 0 0  0 0 6     Fall Risk    04/06/2022    3:41 PM 02/15/2022    9:42 AM  10/11/2021    9:46 AM 08/19/2021  8:45 AM 07/08/2021   10:50 AM  Fall Risk   Falls in the past year? 1 1 0 0 0  Number falls in past yr: 0 0 0 0 0  Injury with Fall? 0 0 0 0 0  Risk for fall due to : No Fall Risks No Fall Risks No Fall Risks No Fall Risks No Fall Risks  Follow up Falls prevention discussed Falls prevention discussed Falls prevention discussed Falls prevention discussed Falls prevention discussed    FALL RISK PREVENTION PERTAINING TO THE HOME:  Any stairs in or around the home? Yes  If so, are there any without handrails? No  Home free of loose throw rugs in walkways, pet beds, electrical cords, etc? Yes  Adequate lighting in your home to reduce risk of falls? Yes   ASSISTIVE DEVICES UTILIZED TO PREVENT FALLS:  Life alert? No  Use of a cane, walker or w/c? No  Grab bars in the bathroom? No  Shower chair or bench in shower? No  Elevated toilet seat or a handicapped toilet? Yes   TIMED UP AND GO:  Was the test performed? No . Telephonic visit.   Cognitive Function: Normal cognitive status assessed by direct observation by this Nurse Health Advisor. No abnormalities found.          01/01/2020   10:17 AM 12/27/2018   10:07 AM 12/25/2017   11:06 AM  6CIT Screen  What Year? 0 points 0 points 0 points  What month? 0 points 0 points 0 points  What time? 0 points 0 points 0 points  Count back from 20 0 points 0 points 0 points  Months in reverse 0 points 2 points 0 points  Repeat phrase 0 points 0 points 0 points  Total Score 0 points 2 points 0 points    Immunizations Immunization History  Administered Date(s) Administered   Fluad Quad(high Dose 65+) 08/11/2019, 07/06/2020, 07/08/2021   Influenza, High Dose Seasonal PF 09/20/2015, 07/17/2016, 07/03/2017, 07/03/2018   Influenza-Unspecified 07/30/2013   Moderna Sars-Covid-2 Vaccination 12/05/2019, 01/02/2020, 08/31/2020, 03/24/2021   Pneumococcal Conjugate-13 05/04/2016   Pneumococcal Polysaccharide-23  08/27/2007, 06/17/2013   Tdap 09/16/2010   Zoster Recombinat (Shingrix) 05/30/2018, 07/29/2018   Zoster, Live 02/28/2011    TDAP status: Up to date per patient done at [facility] need records  Flu Vaccine status: Up to date  Pneumococcal vaccine status: Up to date  Covid-19 vaccine status: Completed vaccines  Qualifies for Shingles Vaccine? Yes   Zostavax completed Yes   Shingrix Completed?: Yes  Screening Tests Health Maintenance  Topic Date Due   TETANUS/TDAP  09/16/2020   COVID-19 Vaccine (5 - Booster for Moderna series) 05/19/2021   INFLUENZA VACCINE  05/30/2022   MAMMOGRAM  07/27/2022   COLONOSCOPY (Pts 45-77yr Insurance coverage will need to be confirmed)  03/09/2032   Pneumonia Vaccine 75 Years old  Completed   DEXA SCAN  Completed   Hepatitis C Screening  Completed   Zoster Vaccines- Shingrix  Completed   HPV VACCINES  Aged Out    Health Maintenance  Health Maintenance Due  Topic Date Due   TETANUS/TDAP  09/16/2020   COVID-19 Vaccine (5 - Booster for Moderna series) 05/19/2021    Colorectal cancer screening: No longer required.   Mammogram status: Completed 07/27/21. Repeat every year. Scheduled for 07/28/22  Bone Density status: Completed 05/05/21. Results reflect: Bone density results: OSTEOPENIA. Repeat every 2 years.  Lung Cancer Screening: (Low Dose CT Chest recommended if Age 75-80years, 30 pack-year currently smoking  OR have quit w/in 15years.) does not qualify.   Additional Screening:  Hepatitis C Screening: does qualify; Completed 03/17/16  Vision Screening: Recommended annual ophthalmology exams for early detection of glaucoma and other disorders of the eye. Is the patient up to date with their annual eye exam?  Yes  Who is the provider or what is the name of the office in which the patient attends annual eye exams? [facility].   Dental Screening: Recommended annual dental exams for proper oral hygiene  Community Resource Referral /  Chronic Care Management: CRR required this visit?  No   CCM required this visit?  No      Plan:     I have personally reviewed and noted the following in the patient's chart:   Medical and social history Use of alcohol, tobacco or illicit drugs  Current medications and supplements including opioid prescriptions.  Functional ability and status Nutritional status Physical activity Advanced directives List of other physicians Hospitalizations, surgeries, and ER visits in previous 12 months Vitals Screenings to include cognitive, depression, and falls Referrals and appointments  In addition, I have reviewed and discussed with patient certain preventive protocols, quality metrics, and best practice recommendations. A written personalized care plan for preventive services as well as general preventive health recommendations were provided to patient.     Clemetine Marker, LPN   07/03/5037   Nurse Notes: none

## 2022-04-06 NOTE — Patient Instructions (Signed)
[name] , Thank you for taking time to come for your Medicare Wellness Visit. I appreciate your ongoing commitment to your health goals. Please review the following plan we discussed and let me know if I can assist you in the future.   Screening recommendations/referrals: Colonoscopy: no longer required  Mammogram: done 07/28/21. Scheduled 07/28/22 Bone Density: done 05/05/21 Recommended yearly ophthalmology/optometry visit for glaucoma screening and checkup Recommended yearly dental visit for hygiene and checkup  Vaccinations: Influenza vaccine: done 07/08/21 Pneumococcal vaccine: done 05/04/16 Tdap vaccine: due Shingles vaccine: done 05/30/18 & 07/29/18   Covid-19:done 12/05/19, 01/02/20, 08/31/20 & 03/24/21  Advanced directives: Please bring a copy of your health care power of attorney and living will to the office at your convenience once you have completed those documents.   Conditions/risks identified: recommend increasing physical activity  Next appointment: Follow up in one year for your annual wellness visit    Preventive Care 65 Years and Older, Female Preventive care refers to lifestyle choices and visits with your health care provider that can promote health and wellness. What does preventive care include? A yearly physical exam. This is also called an annual well check. Dental exams once or twice a year. Routine eye exams. Ask your health care provider how often you should have your eyes checked. Personal lifestyle choices, including: Daily care of your teeth and gums. Regular physical activity. Eating a healthy diet. Avoiding tobacco and drug use. Limiting alcohol use. Practicing safe sex. Taking low-dose aspirin every day. Taking vitamin and mineral supplements as recommended by your health care provider. What happens during an annual well check? The services and screenings done by your health care provider during your annual well check will depend on your age, overall  health, lifestyle risk factors, and family history of disease. Counseling  Your health care provider may ask you questions about your: Alcohol use. Tobacco use. Drug use. Emotional well-being. Home and relationship well-being. Sexual activity. Eating habits. History of falls. Memory and ability to understand (cognition). Work and work Statistician. Reproductive health. Screening  You may have the following tests or measurements: Height, weight, and BMI. Blood pressure. Lipid and cholesterol levels. These may be checked every 5 years, or more frequently if you are over 37 years old. Skin check. Lung cancer screening. You may have this screening every year starting at age 3 if you have a 30-pack-year history of smoking and currently smoke or have quit within the past 15 years. Fecal occult blood test (FOBT) of the stool. You may have this test every year starting at age 73. Flexible sigmoidoscopy or colonoscopy. You may have a sigmoidoscopy every 5 years or a colonoscopy every 10 years starting at age 38. Hepatitis C blood test. Hepatitis B blood test. Sexually transmitted disease (STD) testing. Diabetes screening. This is done by checking your blood sugar (glucose) after you have not eaten for a while (fasting). You may have this done every 1-3 years. Bone density scan. This is done to screen for osteoporosis. You may have this done starting at age 33. Mammogram. This may be done every 1-2 years. Talk to your health care provider about how often you should have regular mammograms. Talk with your health care provider about your test results, treatment options, and if necessary, the need for more tests. Vaccines  Your health care provider may recommend certain vaccines, such as: Influenza vaccine. This is recommended every year. Tetanus, diphtheria, and acellular pertussis (Tdap, Td) vaccine. You may need a Td booster every  10 years. Zoster vaccine. You may need this after age  23. Pneumococcal 13-valent conjugate (PCV13) vaccine. One dose is recommended after age 21. Pneumococcal polysaccharide (PPSV23) vaccine. One dose is recommended after age 51. Talk to your health care provider about which screenings and vaccines you need and how often you need them. This information is not intended to replace advice given to you by your health care provider. Make sure you discuss any questions you have with your health care provider. Document Released: 11/12/2015 Document Revised: 07/05/2016 Document Reviewed: 08/17/2015 Elsevier Interactive Patient Education  2017 [place] Prevention in the Home Falls can cause injuries. They can happen to people of all ages. There are many things you can do to make your home safe and to help prevent falls. What can I do on the outside of my home? Regularly fix the edges of walkways and driveways and fix any cracks. Remove anything that might make you trip as you walk through a door, such as a raised step or threshold. Trim any bushes or trees on the path to your home. Use bright outdoor lighting. Clear any walking paths of anything that might make someone trip, such as rocks or tools. Regularly check to see if handrails are loose or broken. Make sure that both sides of any steps have handrails. Any raised decks and porches should have guardrails on the edges. Have any leaves, snow, or ice cleared regularly. Use sand or salt on walking paths during winter. Clean up any spills in your garage right away. This includes oil or grease spills. What can I do in the bathroom? Use night lights. Install grab bars by the toilet and in the tub and shower. Do not use towel bars as grab bars. Use non-skid mats or decals in the tub or shower. If you need to sit down in the shower, use a plastic, non-slip stool. Keep the floor dry. Clean up any water that spills on the floor as soon as it happens. Remove soap buildup in the tub or shower  regularly. Attach bath mats securely with double-sided non-slip rug tape. Do not have throw rugs and other things on the floor that can make you trip. What can I do in the bedroom? Use night lights. Make sure that you have a light by your bed that is easy to reach. Do not use any sheets or blankets that are too big for your bed. They should not hang down onto the floor. Have a firm chair that has side arms. You can use this for support while you get dressed. Do not have throw rugs and other things on the floor that can make you trip. What can I do in the kitchen? Clean up any spills right away. Avoid walking on wet floors. Keep items that you use a lot in easy-to-reach places. If you need to reach something above you, use a strong step stool that has a grab bar. Keep electrical cords out of the way. Do not use floor polish or wax that makes floors slippery. If you must use wax, use non-skid floor wax. Do not have throw rugs and other things on the floor that can make you trip. What can I do with my stairs? Do not leave any items on the stairs. Make sure that there are handrails on both sides of the stairs and use them. Fix handrails that are broken or loose. Make sure that handrails are as long as the stairways. Check any carpeting to make  sure that it is firmly attached to the stairs. Fix any carpet that is loose or worn. Avoid having throw rugs at the top or bottom of the stairs. If you do have throw rugs, attach them to the floor with carpet tape. Make sure that you have a light switch at the top of the stairs and the bottom of the stairs. If you do not have them, ask someone to add them for you. What else can I do to help prevent falls? Wear shoes that: Do not have high heels. Have rubber bottoms. Are comfortable and fit you well. Are closed at the toe. Do not wear sandals. If you use a stepladder: Make sure that it is fully opened. Do not climb a closed stepladder. Make sure that  both sides of the stepladder are locked into place. Ask someone to hold it for you, if possible. Clearly mark and make sure that you can see: Any grab bars or handrails. First and last steps. Where the edge of each step is. Use tools that help you move around (mobility aids) if they are needed. These include: Canes. Walkers. Scooters. Crutches. Turn on the lights when you go into a dark area. Replace any light bulbs as soon as they burn out. Set up your furniture so you have a clear path. Avoid moving your furniture around. If any of your floors are uneven, fix them. If there are any pets around you, be aware of where they are. Review your medicines with your doctor. Some medicines can make you feel dizzy. This can increase your chance of falling. Ask your doctor what other things that you can do to help prevent falls. This information is not intended to replace advice given to you by your health care provider. Make sure you discuss any questions you have with your health care provider. Document Released: 08/12/2009 Document Revised: 03/23/2016 Document Reviewed: 11/20/2014 Elsevier Interactive Patient Education  2017 [employer].

## 2022-04-08 ENCOUNTER — Other Ambulatory Visit: Payer: Self-pay | Admitting: Family Medicine

## 2022-04-08 DIAGNOSIS — M170 Bilateral primary osteoarthritis of knee: Secondary | ICD-10-CM

## 2022-04-27 DIAGNOSIS — M542 Cervicalgia: Secondary | ICD-10-CM | POA: Diagnosis not present

## 2022-04-27 DIAGNOSIS — F32A Depression, unspecified: Secondary | ICD-10-CM | POA: Diagnosis not present

## 2022-04-27 DIAGNOSIS — R351 Nocturia: Secondary | ICD-10-CM | POA: Diagnosis not present

## 2022-04-27 DIAGNOSIS — F419 Anxiety disorder, unspecified: Secondary | ICD-10-CM | POA: Diagnosis not present

## 2022-04-27 DIAGNOSIS — R55 Syncope and collapse: Secondary | ICD-10-CM | POA: Diagnosis not present

## 2022-04-27 DIAGNOSIS — R9082 White matter disease, unspecified: Secondary | ICD-10-CM | POA: Diagnosis not present

## 2022-05-04 ENCOUNTER — Encounter: Payer: Self-pay | Admitting: Hematology and Oncology

## 2022-05-08 ENCOUNTER — Encounter: Payer: Self-pay | Admitting: Urology

## 2022-05-08 ENCOUNTER — Ambulatory Visit (INDEPENDENT_AMBULATORY_CARE_PROVIDER_SITE_OTHER): Payer: Medicare Other | Admitting: Urology

## 2022-05-08 VITALS — BP 147/87 | HR 84

## 2022-05-08 DIAGNOSIS — R32 Unspecified urinary incontinence: Secondary | ICD-10-CM | POA: Diagnosis not present

## 2022-05-08 DIAGNOSIS — R351 Nocturia: Secondary | ICD-10-CM

## 2022-05-08 DIAGNOSIS — N3946 Mixed incontinence: Secondary | ICD-10-CM

## 2022-05-08 MED ORDER — TOLTERODINE TARTRATE ER 4 MG PO CP24: 4.0000 mg | ORAL_CAPSULE | Freq: Every day | ORAL | 3 refills | Status: DC

## 2022-05-08 NOTE — Progress Notes (Signed)
05/08/2022 3:26 PM   [place] 1945/12/13 [id number]  Referring provider: Steele Sizer, MD [address] [place],  [place] [address]  Chief Complaint  Patient presents with   Urinary Incontinence    Follow up    HPI: I was consulted by the above provider to assist the patient's urge incontinence worsening over many months.  She has urge incontinence wearing 1 pad a day damp.  She denies stress incontinence bedwetting.  She voids every 2-3 hours and gets up 3-4 times a night    The patient had mild descensus at rest with grade 2 hypermobility the bladder neck and minimal movement.  No stress incontinence.  Small high grade 1 cystocele.  She had a suburethral swelling nontender and she likely does not have a diverticulum    Patient has mild urge incontinence and moderate nighttime frequency  I will reassess the patient on Myrbetriq.  Desmopressin is a future option.  Assessed on Myrbetriq in about 6 weeks.  She said her primary most bothersome symptom is the nighttime frequency.  Percutaneous tibial nerve stimulation is another option   Frequency and urgency improved during the day but still gets up at night similar amount. The patient was given oxybutynin ER 10 mg and Detrol LA 4 mg and reassess in 8 weeks.  If she does not reach her goal I will discussed desmopressin recognizing potential cost issues and percutaneous tibial nerve stimulation for milder OAB symptoms and nighttime frequency   Frequency and urge incontinence and nocturia all better on tolterodine.  Oxybutynin did not help.  Gets up once a night depending on fluid intake.       Tolterodine not working as well and now she is getting up more at night which is affecting her quality life.    Due to insurance I did not offer desmopressin.  I went through percutaneous tibial nerve stimulation with template.  Handout given.   TOday Patient is on monthly percutaneous tibial nerve stimulation and  Detrol.  Frequency stable.  Minimal key in the door syndrome with urge incontinence during the day.  Gets up once a night instead of 6.  She is very pleased      Today Urgency incontinence much better.  She stopped PTNS last month and does not see a major difference.  Wants to stay on Detrol.  No infections.  Frequency stable  PMH: Past Medical History:  Diagnosis Date   Allergy    Arthritis    knees, lower back   Cancer [facility])    Breast cancer   Esophageal reflux    Hypertension 2006   Osteoporosis    Personal history of malignant neoplasm of breast 2011   right breast   Personal history of radiation therapy 2011   RIGHT lumpectomy w/ radiation    Surgical History: Past Surgical History:  Procedure Laterality Date   ABDOMINAL HYSTERECTOMY     BACK SURGERY     BREAST BIOPSY Right 2011   radiation   BREAST LUMPECTOMY Right 2011   w/ radiation   BREAST SURGERY Right 2011   right breast lumpectomy,L/SN/R for right breast CA    CATARACT EXTRACTION W/PHACO Left 06/10/2018   Procedure: CATARACT EXTRACTION PHACO AND INTRAOCULAR LENS PLACEMENT ([place]) LEFT;  Surgeon: Eulogio Bear, MD;  Location: [place];  Service: Ophthalmology;  Laterality: Left;  Diabetic - oral meds   CATARACT EXTRACTION W/PHACO Right 07/15/2018   Procedure: CATARACT EXTRACTION PHACO AND INTRAOCULAR LENS PLACEMENT ([facility])  RIGHT;  Surgeon: Eulogio Bear, MD;  Location: [place];  Service: Ophthalmology;  Laterality: Right;   COLONOSCOPY  2003   COLONOSCOPY N/A 03/09/2022   Procedure: COLONOSCOPY WITH BIOPSY;  Surgeon: Lucilla Lame, MD;  Location: [facility];  Service: Endoscopy;  Laterality: N/A;   POLYPECTOMY N/A 03/09/2022   Procedure: POLYPECTOMY;  Surgeon: Lucilla Lame, MD;  Location: [facility];  Service: Endoscopy;  Laterality: N/A;   PORT A CATH REVISION     REPLACEMENT TOTAL KNEE Right 10/03/2019   DR. Harlow Mares    WRIST SURGERY  2005    Home Medications:   Allergies as of 05/08/2022       Reactions   Shrimp [shellfish Allergy] Hives   Augmentin [amoxicillin-pot Clavulanate] Nausea Only        Medication List        Accurate as of May 08, 2022  3:26 PM. If you have any questions, ask your nurse or doctor.          acetaminophen 650 MG CR tablet Commonly known as: TYLENOL Take 1,300 mg by mouth every 8 (eight) hours as needed for pain.   amLODipine-valsartan 5-160 MG tablet Commonly known as: EXFORGE Take 1 tablet by mouth daily.   atorvastatin 20 MG tablet Commonly known as: LIPITOR Take 1 tablet (20 mg total) by mouth daily.   buPROPion 150 MG 24 hr tablet Commonly known as: Wellbutrin XL Take 1 tablet (150 mg total) by mouth daily.   CALCIUM-MAGNESIUM PO Take 1 tablet by mouth daily.   cetirizine 10 MG tablet Commonly known as: ZYRTEC Take 10 mg by mouth daily.   denosumab 60 MG/ML Sosy injection Commonly known as: PROLIA Inject 60 mg into the skin every 6 (six) months.   gabapentin 100 MG capsule Commonly known as: NEURONTIN Take 1 capsule by mouth at bedtime   meloxicam 15 MG tablet Commonly known as: MOBIC Take 1 tablet by mouth once daily   metoprolol succinate 50 MG 24 hr tablet Commonly known as: TOPROL-XL Take 1 tablet (50 mg total) by mouth daily. Take with or immediately following a meal.   multivitamin tablet Take 1 tablet by mouth daily.   nystatin powder Commonly known as: nystatin Apply topically 2 (two) times daily.   pantoprazole 40 MG tablet Commonly known as: PROTONIX Take 1 tablet (40 mg total) by mouth daily.   sertraline 50 MG tablet Commonly known as: ZOLOFT Take 1 tablet (50 mg total) by mouth every morning.   tolterodine 4 MG 24 hr capsule Commonly known as: DETROL LA Take 1 capsule (4 mg total) by mouth daily.   VITAMIN D-3 PO Take 1 tablet by mouth daily.        Allergies:  Allergies  Allergen Reactions   Shrimp [Shellfish Allergy] Hives   Augmentin  [Amoxicillin-Pot Clavulanate] Nausea Only    Family History: Family History  Problem Relation Age of Onset   Cancer Father    Bone cancer Father    Breast cancer Mother 34   Stroke Mother    Colon cancer Maternal Grandmother    Breast cancer Cousin     Social History:  reports that she has never smoked. She has never used smokeless tobacco. She reports current alcohol use. She reports that she does not use drugs.  ROS:  Laboratory Data: Lab Results  Component Value Date   WBC 7.6 12/29/2021   HGB 12.7 12/29/2021   HCT 37.8 12/29/2021   MCV 90.6 12/29/2021   PLT 209 12/29/2021    Lab Results  Component Value Date   CREATININE 0.68 12/29/2021    No results found for: "PSA"  No results found for: "TESTOSTERONE"  Lab Results  Component Value Date   HGBA1C 5.8 (H) 02/15/2022    Urinalysis    Component Value Date/Time   COLORURINE YELLOW (A) 12/04/2018 0906   APPEARANCEUR Hazy (A) 12/29/2019 1504   LABSPEC 1.009 12/04/2018 0906   LABSPEC 1.026 03/19/2012 2152   PHURINE 6.0 12/04/2018 0906   GLUCOSEU Negative 12/29/2019 1504   GLUCOSEU Negative 03/19/2012 2152   HGBUR NEGATIVE 12/04/2018 0906   BILIRUBINUR Negative 12/29/2019 1504   BILIRUBINUR Negative 03/19/2012 2152   KETONESUR NEGATIVE 12/04/2018 0906   PROTEINUR Trace (A) 12/29/2019 1504   PROTEINUR NEGATIVE 12/04/2018 0906   NITRITE Negative 12/29/2019 1504   NITRITE NEGATIVE 12/04/2018 0906   LEUKOCYTESUR 1+ (A) 12/29/2019 1504   LEUKOCYTESUR Trace 03/19/2012 2152    Pertinent Imaging:   Assessment & Plan: Prescription renewed 90x3 and I will see in a year  1. Urinary incontinence, unspecified type   - Urinalysis, Complete   No follow-ups on file.  [name], MD  [facility] [address], [place], [place] [address] [phone]

## 2022-05-09 LAB — MICROSCOPIC EXAMINATION
Bacteria, UA: NONE SEEN
RBC, Urine: NONE SEEN /hpf (ref 0–2)

## 2022-05-09 LAB — URINALYSIS, COMPLETE
Bilirubin, UA: NEGATIVE
Glucose, UA: NEGATIVE
Ketones, UA: NEGATIVE
Leukocytes,UA: NEGATIVE
Nitrite, UA: NEGATIVE
Protein,UA: NEGATIVE
RBC, UA: NEGATIVE
Specific Gravity, UA: 1.015 (ref 1.005–1.030)
Urobilinogen, Ur: 0.2 mg/dL (ref 0.2–1.0)
pH, UA: 5.5 (ref 5.0–7.5)

## 2022-05-12 ENCOUNTER — Other Ambulatory Visit: Payer: Self-pay | Admitting: Student

## 2022-05-12 DIAGNOSIS — R55 Syncope and collapse: Secondary | ICD-10-CM

## 2022-05-12 DIAGNOSIS — M542 Cervicalgia: Secondary | ICD-10-CM

## 2022-05-18 ENCOUNTER — Ambulatory Visit: Admission: RE | Admit: 2022-05-18 | Payer: Medicare Other | Source: Ambulatory Visit

## 2022-05-24 ENCOUNTER — Other Ambulatory Visit: Payer: Medicare Other

## 2022-05-24 ENCOUNTER — Ambulatory Visit
Admission: RE | Admit: 2022-05-24 | Discharge: 2022-05-24 | Disposition: A | Discharge status: Home / Self Care | Payer: Medicare Other | Source: Ambulatory Visit | Attending: Student | Admitting: Student

## 2022-05-24 DIAGNOSIS — R55 Syncope and collapse: Secondary | ICD-10-CM | POA: Diagnosis not present

## 2022-05-24 DIAGNOSIS — M542 Cervicalgia: Secondary | ICD-10-CM | POA: Diagnosis not present

## 2022-06-16 NOTE — Progress Notes (Unsigned)
Name: Mary Todd   MRN: [id number]    DOB: 1946-04-05   Date:06/19/2022       Progress Note  Subjective  Chief Complaint  Follow Up  HPI  Pre-diabetes: she denies polyphagia, polydipsia or polyuria, last A1C was 5.8 % , she has been off Metformin . She has not been following a low sugar diet   Syncopal Episode: two episodes in 2022, seen by cardiologist and work up negative - Dr. Mylo Red thought likely vasovagal episode. She also saw Dr. Manuella Ghazi - neurologist and worked up essentially normal , she had a sleep study , per [facility] chart mild OSA and may consider auto Pap 5-20 cm H2O but she never started therapy   DDD cervical spine: she had repeat MRI [place]-spine , ordered by neurologist , they discussed referral psyatrist but she states pain is not bad enough and does not want referral at this time   IMPRESSION: c-spine done 04/2022  Chronic degenerative spondylosis from C3-4 through C7-T1, not  visibly worsened since 2017. No compressive canal stenosis.  Foraminal narrowing that could possibly be symptomatic on both sides  at the C5-6 level.   Worsening of disc degeneration at T1-2 with loss of disc height and  a shallow disc herniation more prominent in the left posterolateral  direction with proximal foraminal encroachment. This level was not  studied in the axial plane. Discogenic edema of the endplates could  relate to regional pain. There is probably proximal foraminal  encroachment on the left that could possibly affect the left T1  nerve.   Urge incontinence: . She is under the care of Urologist, taking Detrol and seems to be improving symptoms, she is also having post tibial neuro stimulation needle electrode therapy monthly - but stopped therapy when reached the desired outcome. She states nocturia down to once per night   HTN: she is taking  Exforge 5/160 and also metoprolol, tolerating medication well, no chest pain or palpitation. She denies dizziness    GERD: She is  back on PPI daily , unable to tolerate skipping doses    OA: she had TKR right knee on Dec 4 th 2020 by Dr. Harlow Mares . She needs to take Meloxicam daily otherwise she feels very stiff, discussed importance of trying to take 7.5 mg but she has not tried yet    History Right Breast Cancer/hodgkins lymphoma : under the care of Dr. Mike Gip  she has been released from her surgeon. . Also diagnosed with hodgkin's lymphoma in 2004, breast cancer diagnosed in 2011  she stopped Arimidex Feb 2021 , up to date with follow ups and imaging , due for repeat in September 2023  Atherosclerosis aorta/ischemic brain disease : on recent CT abdomen, last LDLwas at goal down from 100 to 55 on current statin therapy  . MRI brain showed white matter ischemic brain disease. Goal LDL below 70   Fatty liver and hepatomegaly: seen by GI and was advised to try losing weight. Liver enzymes normal   Osteoporosis: diagnosed in 2018 with AP spine score of -3.1 . She gets Prolia at the cancer center and she is tolerating medications well. Compliant, last bone density was done in 2022    Thyroid nodules: incidental finding on CT neck , had biopsy by  Dr. Gabriel Carina Dec 2017  and biopsy was negative, she was last seen 05/2020 and has been released from her care . Denies dysphagia  or hair loss    Morbid obesity: BMI is over 40  now, she joined [employer] in June 2023 - doing the online app, weight is down on 2 lbs, discussed meetings   Dysthymia: she took Citalopram for years and weaned self off in 2022. She tried Wellbutrin and it improved her fatigue but we stopped due to syncopal episode and possibility of seizures, but she has be cleared by Dr. Manuella Ghazi and is back on medication, he also added Zoloft at 25 mg but she has noticed any improvement of symptoms, we will try adjusting dose to 50 mg daily  Normal Phq 9 but she is under stress at home , taking care of two children a 81 yo and 71 yo ( great- great niece and nephew ) , they are  going to school full day   Patient Active Problem List   Diagnosis Date Noted   Polyp of transverse colon    Total knee replacement status, right 10/03/2019   Encounter for screening colonoscopy 07/02/2018   Anterolisthesis 08/06/2017   DDD (degenerative disc disease), lumbosacral 08/06/2017   Chondromalacia patellae 07/03/2017   Osteoporosis 12/14/2016   Morbid obesity ([place]) 07/17/2016   Spinal stenosis in cervical region 05/22/2016   Thyroid nodule 05/22/2016   GERD without esophagitis 03/16/2016   Neck muscle spasm 03/16/2016   History of back surgery 03/16/2016   Osteoarthritis of both knees 03/16/2016   Large breasts 03/16/2016   Hypertension, benign 03/16/2016   Perennial allergic rhinitis with seasonal variation 03/16/2016   History of pneumococcal pneumonia 09/20/2015   Hodgkin disease ([place]) 07/15/2014   Breast cancer, right ([place]) 06/26/2013    Past Surgical History:  Procedure Laterality Date   ABDOMINAL HYSTERECTOMY     BACK SURGERY     BREAST BIOPSY Right 2011   radiation   BREAST LUMPECTOMY Right 2011   w/ radiation   BREAST SURGERY Right 2011   right breast lumpectomy,L/SN/R for right breast CA    CATARACT EXTRACTION W/PHACO Left 06/10/2018   Procedure: CATARACT EXTRACTION PHACO AND INTRAOCULAR LENS PLACEMENT ([place]) LEFT;  Surgeon: Eulogio Bear, MD;  Location: [facility];  Service: Ophthalmology;  Laterality: Left;  Diabetic - oral meds   CATARACT EXTRACTION W/PHACO Right 07/15/2018   Procedure: CATARACT EXTRACTION PHACO AND INTRAOCULAR LENS PLACEMENT ([place]) RIGHT;  Surgeon: Eulogio Bear, MD;  Location: [facility];  Service: Ophthalmology;  Laterality: Right;   COLONOSCOPY  2003   COLONOSCOPY N/A 03/09/2022   Procedure: COLONOSCOPY WITH BIOPSY;  Surgeon: Lucilla Lame, MD;  Location: [facility];  Service: Endoscopy;  Laterality: N/A;   POLYPECTOMY N/A 03/09/2022   Procedure: POLYPECTOMY;  Surgeon: Lucilla Lame, MD;  Location:  [facility];  Service: Endoscopy;  Laterality: N/A;   PORT A CATH REVISION     REPLACEMENT TOTAL KNEE Right 10/03/2019   DR. Magee  2005    Family History  Problem Relation Age of Onset   Cancer Father    Bone cancer Father    Breast cancer Mother 105   Stroke Mother    Colon cancer Maternal Grandmother    Breast cancer Cousin     Social History   Tobacco Use   Smoking status: Never    Passive exposure: Never   Smokeless tobacco: Never   Tobacco comments:    smoking cessation materials not required  Substance Use Topics   Alcohol use: Yes    Comment: occasionally     Current Outpatient Medications:    acetaminophen (TYLENOL) 650 MG CR tablet, Take  1,300 mg by mouth every 8 (eight) hours as needed for pain., Disp: , Rfl:    amLODipine-valsartan (EXFORGE) 5-160 MG tablet, Take 1 tablet by mouth daily., Disp: 90 tablet, Rfl: 1   atorvastatin (LIPITOR) 20 MG tablet, Take 1 tablet (20 mg total) by mouth daily., Disp: 90 tablet, Rfl: 1   buPROPion (WELLBUTRIN XL) 150 MG 24 hr tablet, Take 1 tablet (150 mg total) by mouth daily., Disp: 90 tablet, Rfl: 1   CALCIUM-MAGNESIUM PO, Take 1 tablet by mouth daily., Disp: , Rfl:    cetirizine (ZYRTEC) 10 MG tablet, Take 10 mg by mouth daily., Disp: , Rfl:    Cholecalciferol (VITAMIN D-3 PO), Take 1 tablet by mouth daily., Disp: , Rfl:    denosumab (PROLIA) 60 MG/ML SOSY injection, Inject 60 mg into the skin every 6 (six) months., Disp: , Rfl:    meloxicam (MOBIC) 15 MG tablet, Take 1 tablet by mouth once daily, Disp: 90 tablet, Rfl: 0   metoprolol succinate (TOPROL-XL) 50 MG 24 hr tablet, Take 1 tablet (50 mg total) by mouth daily. Take with or immediately following a meal., Disp: 90 tablet, Rfl: 1   Multiple Vitamin (MULTIVITAMIN) tablet, Take 1 tablet by mouth daily., Disp: , Rfl:    nystatin powder, Apply topically 2 (two) times daily., Disp: 60 g, Rfl: 1   pantoprazole (PROTONIX) 40 MG tablet, Take 1 tablet  (40 mg total) by mouth daily., Disp: 90 tablet, Rfl: 1   sertraline (ZOLOFT) 50 MG tablet, Take 1 tablet (50 mg total) by mouth every morning., Disp: 90 tablet, Rfl: 0   tolterodine (DETROL LA) 4 MG 24 hr capsule, Take 1 capsule (4 mg total) by mouth daily., Disp: 90 capsule, Rfl: 3  Allergies  Allergen Reactions   Shrimp [Shellfish Allergy] Hives   Augmentin [Amoxicillin-Pot Clavulanate] Nausea Only    I personally reviewed active problem list, medication list, allergies, family history, social history, health maintenance with the patient/caregiver today.   ROS  Constitutional: Negative for fever or weight change.  Respiratory: Negative for cough and shortness of breath.   Cardiovascular: Negative for chest pain or palpitations.  Gastrointestinal: Negative for abdominal pain, no bowel changes.  Musculoskeletal: Negative for gait problem or joint swelling.  Skin: Negative for rash.  Neurological: Negative for dizziness or headache.  No other specific complaints in a complete review of systems (except as listed in HPI above).   Objective  Vitals:   06/19/22 0958  BP: 124/86  Pulse: 85  Resp: 16  SpO2: 94%  Weight: 232 lb (105.2 kg)  Height: '5\' 3"'$  (1.6 m)    Body mass index is 41.1 kg/m.  Physical Exam  Constitutional: Patient appears well-developed and well-nourished. Obese  No distress.  HEENT: head atraumatic, normocephalic, pupils equal and reactive to light, neck supple Cardiovascular: Normal rate, regular rhythm and normal heart sounds.  No murmur heard. No BLE edema. Pulmonary/Chest: Effort normal and breath sounds normal. No respiratory distress. Abdominal: Soft.  There is no tenderness. Psychiatric: Patient has a normal mood and affect. behavior is normal. Judgment and thought content normal.   Recent Results (from the past 2160 hour(s))  Urinalysis, Complete     Status: None   Collection Time: 05/08/22  3:36 PM  Result Value Ref Range   Specific Gravity, UA  1.015 1.005 - 1.030   pH, UA 5.5 5.0 - 7.5   Color, UA Yellow Yellow   Appearance Ur Clear Clear   Leukocytes,UA Negative Negative   Protein,UA  Negative Negative/Trace   Glucose, UA Negative Negative   Ketones, UA Negative Negative   RBC, UA Negative Negative   Bilirubin, UA Negative Negative   Urobilinogen, Ur 0.2 0.2 - 1.0 mg/dL   Nitrite, UA Negative Negative   Microscopic Examination See below:   Microscopic Examination     Status: None   Collection Time: 05/08/22  3:36 PM   Urine  Result Value Ref Range   WBC, UA 0-5 0 - 5 /hpf   RBC, Urine None seen 0 - 2 /hpf   Epithelial Cells (non renal) 0-10 0 - 10 /hpf   Bacteria, UA None seen None seen/Few    PHQ2/9:    06/19/2022    9:57 AM 04/06/2022    3:39 PM 02/15/2022    9:42 AM 11/03/2021    3:26 PM 10/11/2021    9:47 AM  Depression screen PHQ 2/9  Decreased Interest 0 0 0 0 0  Down, Depressed, Hopeless 0 0 0 0 0  PHQ - 2 Score 0 0 0 0 0  Altered sleeping 0 0 0  0  Tired, decreased energy 0 0 0  0  Change in appetite 0 0 0  0  Feeling bad or failure about yourself  0 0 0  0  Trouble concentrating 0 0 0  0  Moving slowly or fidgety/restless 0 0 0  0  Suicidal thoughts 0 0 0  0  PHQ-9 Score 0 0 0  0  Difficult doing work/chores     Not difficult at all    phq 9 is negative   Fall Risk:    06/19/2022    9:57 AM 04/06/2022    3:41 PM 02/15/2022    9:42 AM 10/11/2021    9:46 AM 08/19/2021    8:45 AM  Fall Risk   Falls in the past year? 0 1 1 0 0  Number falls in past yr: 0 0 0 0 0  Injury with Fall? 0 0 0 0 0  Risk for fall due to : No Fall Risks No Fall Risks No Fall Risks No Fall Risks No Fall Risks  Follow up Falls prevention discussed Falls prevention discussed Falls prevention discussed Falls prevention discussed Falls prevention discussed      Functional Status Survey: Is the patient deaf or have difficulty hearing?: No Does the patient have difficulty seeing, even when wearing glasses/contacts?: No Does  the patient have difficulty concentrating, remembering, or making decisions?: No Does the patient have difficulty walking or climbing stairs?: Yes Does the patient have difficulty dressing or bathing?: No Does the patient have difficulty doing errands alone such as visiting a doctor's office or shopping?: No    Assessment & Plan  1. Atherosclerosis of aorta ([facility])  On statin therapy and LDL at goal   2. Hodgkin's disease, stage IIA ([place])  Keep follow up with oncologist   3. Morbid obesity ([place])  Advised in person weight watchers   4. Essential hypertension  Bp is at goal   5. GERD without esophagitis  Stable on medication  6. Age-related osteoporosis without current pathological fracture  Continue Prolia  7. Dysthymia  Doing better on medication   8. Pre-diabetes

## 2022-06-19 ENCOUNTER — Ambulatory Visit (INDEPENDENT_AMBULATORY_CARE_PROVIDER_SITE_OTHER): Payer: Medicare Other | Admitting: Family Medicine

## 2022-06-19 ENCOUNTER — Encounter: Payer: Self-pay | Admitting: Family Medicine

## 2022-06-19 VITALS — BP 124/86 | HR 85 | Resp 16 | Ht 63.0 in | Wt 232.0 lb

## 2022-06-19 DIAGNOSIS — I700 Atherosclerosis of aorta: Secondary | ICD-10-CM

## 2022-06-19 DIAGNOSIS — R7303 Prediabetes: Secondary | ICD-10-CM | POA: Diagnosis not present

## 2022-06-19 DIAGNOSIS — M810 Age-related osteoporosis without current pathological fracture: Secondary | ICD-10-CM

## 2022-06-19 DIAGNOSIS — I10 Essential (primary) hypertension: Secondary | ICD-10-CM | POA: Diagnosis not present

## 2022-06-19 DIAGNOSIS — C8190 Hodgkin lymphoma, unspecified, unspecified site: Secondary | ICD-10-CM | POA: Diagnosis not present

## 2022-06-19 DIAGNOSIS — K219 Gastro-esophageal reflux disease without esophagitis: Secondary | ICD-10-CM | POA: Diagnosis not present

## 2022-06-19 DIAGNOSIS — F341 Dysthymic disorder: Secondary | ICD-10-CM | POA: Diagnosis not present

## 2022-06-29 ENCOUNTER — Other Ambulatory Visit: Payer: Self-pay

## 2022-06-29 ENCOUNTER — Ambulatory Visit: Payer: Self-pay

## 2022-06-29 DIAGNOSIS — M810 Age-related osteoporosis without current pathological fracture: Secondary | ICD-10-CM

## 2022-06-29 NOTE — Chronic Care Management (AMB) (Signed)
   06/29/2022  Serin Thornell Gutzmer 01-30-1946 [id number]  Documentation encounter created to complete case transition. The care management team will follow for care coordination as needed.   [facility] [phone]

## 2022-06-30 ENCOUNTER — Inpatient Hospital Stay: Payer: Medicare Other

## 2022-06-30 ENCOUNTER — Inpatient Hospital Stay: Payer: Medicare Other | Attending: Oncology

## 2022-06-30 DIAGNOSIS — M810 Age-related osteoporosis without current pathological fracture: Secondary | ICD-10-CM

## 2022-06-30 DIAGNOSIS — C50912 Malignant neoplasm of unspecified site of left female breast: Secondary | ICD-10-CM | POA: Insufficient documentation

## 2022-06-30 LAB — BASIC METABOLIC PANEL
Anion gap: 9 (ref 5–15)
BUN: 17 mg/dL (ref 8–23)
CO2: 25 mmol/L (ref 22–32)
Calcium: 9 mg/dL (ref 8.9–10.3)
Chloride: 101 mmol/L (ref 98–111)
Creatinine, Ser: 0.71 mg/dL (ref 0.44–1.00)
GFR, Estimated: >60 mL/min (ref >60)
Glucose, Bld: 101 mg/dL — ABNORMAL HIGH (ref 70–99)
Potassium: 4 mmol/L (ref 3.5–5.1)
Sodium: 135 mmol/L (ref 135–145)

## 2022-06-30 MED ORDER — DENOSUMAB 60 MG/ML ~~LOC~~ SOSY
60.0000 mg | PREFILLED_SYRINGE | Freq: Once | SUBCUTANEOUS | Status: AC
  Administered 2022-06-30: 60 mg via SUBCUTANEOUS
  Filled 2022-06-30: qty 1

## 2022-07-08 ENCOUNTER — Other Ambulatory Visit: Payer: Self-pay | Admitting: Family Medicine

## 2022-07-08 DIAGNOSIS — M170 Bilateral primary osteoarthritis of knee: Secondary | ICD-10-CM

## 2022-07-28 ENCOUNTER — Ambulatory Visit
Admission: RE | Admit: 2022-07-28 | Discharge: 2022-07-28 | Disposition: A | Discharge status: Home / Self Care | Payer: Medicare Other | Source: Ambulatory Visit | Attending: Oncology | Admitting: Oncology

## 2022-07-28 DIAGNOSIS — Z1231 Encounter for screening mammogram for malignant neoplasm of breast: Secondary | ICD-10-CM | POA: Insufficient documentation

## 2022-08-01 ENCOUNTER — Other Ambulatory Visit: Payer: Self-pay | Admitting: Oncology

## 2022-08-01 DIAGNOSIS — N6489 Other specified disorders of breast: Secondary | ICD-10-CM

## 2022-08-01 DIAGNOSIS — R928 Other abnormal and inconclusive findings on diagnostic imaging of breast: Secondary | ICD-10-CM

## 2022-08-02 ENCOUNTER — Telehealth: Payer: Self-pay

## 2022-08-02 NOTE — Telephone Encounter (Signed)
Per Roselyn Reef, they will get pt scheduled and contact with appt once orders are signed.

## 2022-08-02 NOTE — Telephone Encounter (Signed)
-----   Message from Earlie Server, MD sent at 08/01/2022 10:54 PM EDT ----- She needs Diagnostic mammogram and possibly ultrasound of the right breast.

## 2022-08-07 ENCOUNTER — Ambulatory Visit (INDEPENDENT_AMBULATORY_CARE_PROVIDER_SITE_OTHER): Payer: Medicare Other

## 2022-08-07 DIAGNOSIS — Z23 Encounter for immunization: Secondary | ICD-10-CM | POA: Diagnosis not present

## 2022-08-08 ENCOUNTER — Ambulatory Visit
Admission: RE | Admit: 2022-08-08 | Discharge: 2022-08-08 | Disposition: A | Discharge status: Home / Self Care | Payer: Medicare Other | Source: Ambulatory Visit | Attending: Oncology | Admitting: Oncology

## 2022-08-08 DIAGNOSIS — N6489 Other specified disorders of breast: Secondary | ICD-10-CM

## 2022-08-08 DIAGNOSIS — R928 Other abnormal and inconclusive findings on diagnostic imaging of breast: Secondary | ICD-10-CM | POA: Insufficient documentation

## 2022-08-08 DIAGNOSIS — R922 Inconclusive mammogram: Secondary | ICD-10-CM | POA: Diagnosis not present

## 2022-08-22 ENCOUNTER — Ambulatory Visit
Admission: EM | Admit: 2022-08-22 | Discharge: 2022-08-22 | Disposition: A | Discharge status: Home / Self Care | Payer: Medicare Other | Attending: Family Medicine | Admitting: Family Medicine

## 2022-08-22 ENCOUNTER — Ambulatory Visit (INDEPENDENT_AMBULATORY_CARE_PROVIDER_SITE_OTHER): Payer: Medicare Other

## 2022-08-22 DIAGNOSIS — R9082 White matter disease, unspecified: Secondary | ICD-10-CM | POA: Diagnosis not present

## 2022-08-22 DIAGNOSIS — R079 Chest pain, unspecified: Secondary | ICD-10-CM

## 2022-08-22 DIAGNOSIS — R059 Cough, unspecified: Secondary | ICD-10-CM

## 2022-08-22 DIAGNOSIS — F32A Depression, unspecified: Secondary | ICD-10-CM | POA: Diagnosis not present

## 2022-08-22 DIAGNOSIS — F419 Anxiety disorder, unspecified: Secondary | ICD-10-CM | POA: Diagnosis not present

## 2022-08-22 DIAGNOSIS — R0789 Other chest pain: Secondary | ICD-10-CM | POA: Diagnosis not present

## 2022-08-22 DIAGNOSIS — R351 Nocturia: Secondary | ICD-10-CM | POA: Diagnosis not present

## 2022-08-22 DIAGNOSIS — J189 Pneumonia, unspecified organism: Secondary | ICD-10-CM | POA: Diagnosis not present

## 2022-08-22 DIAGNOSIS — R55 Syncope and collapse: Secondary | ICD-10-CM | POA: Diagnosis not present

## 2022-08-22 DIAGNOSIS — M542 Cervicalgia: Secondary | ICD-10-CM | POA: Diagnosis not present

## 2022-08-22 DIAGNOSIS — G4733 Obstructive sleep apnea (adult) (pediatric): Secondary | ICD-10-CM | POA: Diagnosis not present

## 2022-08-22 MED ORDER — ALBUTEROL SULFATE HFA 108 (90 BASE) MCG/ACT IN AERS: 2.0000 | INHALATION_SPRAY | RESPIRATORY_TRACT | 0 refills | Status: DC | PRN

## 2022-08-22 MED ORDER — CLARITHROMYCIN 500 MG PO TABS: 500.0000 mg | ORAL_TABLET | Freq: Two times a day (BID) | ORAL | 0 refills | Status: AC

## 2022-08-22 MED ORDER — PROMETHAZINE-DM 6.25-15 MG/5ML PO SYRP: 5.0000 mL | ORAL_SOLUTION | Freq: Four times a day (QID) | ORAL | 0 refills | Status: DC | PRN

## 2022-08-22 NOTE — ED Triage Notes (Signed)
Pt c/o sore throat, cough, throat and chest burning from drainage x5days

## 2022-08-22 NOTE — ED Provider Notes (Signed)
[facility]    CSN: [id number] Arrival date & time: 08/22/22  1509      History   Chief Complaint Chief Complaint  Patient presents with   Cough   Sore Throat    HPI Mary Todd Todd is a 76 y.o. female.   HPI   Mary Todd Todd presents for cough and chest drainage. Has a sore throat and burning sensation in her chest. No painful swallowing. Has chest tightness with coughing with intermittent shortness of breath. Symptoms started about 5 days ago. No one else has similar sx.  Denies fever, chills, nausea, vomiting, diarrhea, abdominal pain, nasal congestion, rash. She wakes up coughing.   Endorse hoarseness. Had a slight headache today.  She took Mary Todd Todd for her headache which helped.  Had similar sx previously when she had bronchitis. She does not smoke.      Past Medical History:  Diagnosis Date   Allergy    Arthritis    knees, lower back   Cancer Mary Todd Todd)    Breast cancer   Esophageal reflux    Hypertension 2006   Osteoporosis    Personal history of malignant neoplasm of breast 2011   right breast   Personal history of radiation therapy 2011   RIGHT lumpectomy w/ radiation    Patient Active Problem List   Diagnosis Date Noted   Polyp of transverse colon    Total knee replacement status, right 10/03/2019   Encounter for screening colonoscopy 07/02/2018   Anterolisthesis 08/06/2017   DDD (degenerative disc disease), lumbosacral 08/06/2017   Chondromalacia patellae 07/03/2017   Osteoporosis 12/14/2016   Morbid obesity (Mary Todd) 07/17/2016   Spinal stenosis in cervical region 05/22/2016   Thyroid nodule 05/22/2016   GERD without esophagitis 03/16/2016   Neck muscle spasm 03/16/2016   History of back surgery 03/16/2016   Osteoarthritis of both knees 03/16/2016   Large breasts 03/16/2016   Hypertension, benign 03/16/2016   Perennial allergic rhinitis with seasonal variation 03/16/2016   History of pneumococcal pneumonia 09/20/2015   Hodgkin disease  (Mary Todd Todd) 07/15/2014   Breast cancer, right (Mary Todd Todd) 06/26/2013    Past Surgical History:  Procedure Laterality Date   ABDOMINAL HYSTERECTOMY     BACK SURGERY     BREAST BIOPSY Right 2011   radiation   BREAST LUMPECTOMY Right 2011   w/ radiation   BREAST SURGERY Right 2011   right breast lumpectomy,L/SN/R for right breast CA    CATARACT EXTRACTION W/PHACO Left 06/10/2018   Procedure: CATARACT EXTRACTION PHACO AND INTRAOCULAR LENS PLACEMENT (Mary Todd Todd) LEFT;  Surgeon: Mary Todd Bear, MD;  Location: Mary Todd [facility];  Service: Ophthalmology;  Laterality: Left;  Diabetic - oral meds   CATARACT EXTRACTION W/PHACO Right 07/15/2018   Procedure: CATARACT EXTRACTION PHACO AND INTRAOCULAR LENS PLACEMENT ([place]) RIGHT;  Surgeon: Mary Todd Bear, MD;  Location: [facility];  Service: Ophthalmology;  Laterality: Right;   COLONOSCOPY  2003   COLONOSCOPY N/A 03/09/2022   Procedure: COLONOSCOPY WITH BIOPSY;  Surgeon: Mary Todd Lame, MD;  Location: [facility];  Service: Endoscopy;  Laterality: N/A;   POLYPECTOMY N/A 03/09/2022   Procedure: POLYPECTOMY;  Surgeon: Mary Todd Lame, MD;  Location: [facility];  Service: Endoscopy;  Laterality: N/A;   PORT A CATH REVISION     REPLACEMENT TOTAL KNEE Right 10/03/2019   DR. Fowler SURGERY  2005    OB History     Gravida  2   Para  2   Term  Preterm      AB      Living  2      SAB      IAB      Ectopic      Multiple      Live Births           Obstetric Comments  1st Menstrual Cycle:  12 1st Pregnancy:  22.          Home Medications    Prior to Admission medications   Medication Sig Start Date End Date Taking? Authorizing Provider  acetaminophen (Mary Todd Todd) 650 MG CR tablet Take 1,300 mg by mouth every 8 (eight) hours as needed for pain.   Yes [provider]  albuterol (VENTOLIN HFA) 108 (90 Base) MCG/ACT inhaler Inhale 2 puffs into the lungs every 4 (four) hours as needed for wheezing or  shortness of breath. 08/22/22  Yes Mary Todd Laura, DO  amLODipine-valsartan (EXFORGE) 5-160 MG tablet Take 1 tablet by mouth daily. 02/15/22  Yes Todd, Mary Todd Stager, MD  atorvastatin (LIPITOR) 20 MG tablet Take 1 tablet (20 mg total) by mouth daily. 02/15/22  Yes Todd, Mary Todd Stager, MD  buPROPion (WELLBUTRIN XL) 150 MG 24 hr tablet Take 1 tablet (150 mg total) by mouth daily. 02/15/22  Yes Todd, Mary Todd Stager, MD  cetirizine (ZYRTEC) 10 MG tablet Take 10 mg by mouth daily.   Yes [provider]  Cholecalciferol (VITAMIN D-3 PO) Take 1 tablet by mouth daily.   Yes [provider]  clarithromycin (BIAXIN) 500 MG tablet Take 1 tablet (500 mg total) by mouth 2 (two) times daily for 5 days. 08/22/22 08/27/22 Yes Mary Todd Blecher, DO  denosumab (PROLIA) 60 MG/ML SOSY injection Inject 60 mg into the skin every 6 (six) months.   Yes Mary Todd Asal, MD  meloxicam (MOBIC) 15 MG tablet Take 1 tablet by mouth once daily 07/09/22  Yes Todd, Mary Todd Stager, MD  metoprolol succinate (TOPROL-XL) 50 MG 24 hr tablet Take 1 tablet (50 mg total) by mouth daily. Take with or immediately following a meal. 02/15/22  Yes Todd, Mary Todd Stager, MD  Multiple Vitamin (MULTIVITAMIN) tablet Take 1 tablet by mouth daily.   Yes [provider]  nystatin powder Apply topically 2 (two) times daily. 02/15/22  Yes Todd, Mary Todd Stager, MD  pantoprazole (PROTONIX) 40 MG tablet Take 1 tablet (40 mg total) by mouth daily. 02/15/22  Yes Todd, Mary Todd Stager, MD  promethazine-dextromethorphan (PROMETHAZINE-DM) 6.25-15 MG/5ML syrup Take 5 mLs by mouth 4 (four) times daily as needed. 08/22/22  Yes Mary Todd Duffey, DO  sertraline (ZOLOFT) 50 MG tablet Take 1 tablet (50 mg total) by mouth every morning. 02/15/22  Yes Todd, Mary Todd Stager, MD  tolterodine (DETROL LA) 4 MG 24 hr capsule Take 1 capsule (4 mg total) by mouth daily. 05/08/22  Yes Todd, Mary Todd Reaper, MD  CALCIUM-MAGNESIUM PO Take 1 tablet by mouth daily.    [provider]    Family  History Family History  Problem Relation Age of Onset   Cancer Father    Bone cancer Father    Breast cancer Mother 44   Stroke Mother    Colon cancer Maternal Grandmother    Breast cancer Cousin     Social History Social History   Tobacco Use   Smoking status: Never    Passive exposure: Never   Smokeless tobacco: Never   Tobacco comments:    smoking cessation materials not required  Vaping Use   Vaping Use: Never used  Substance Use Topics   Alcohol use: Yes  Comment: occasionally   Drug use: No     Allergies   Shrimp [shellfish allergy] and Augmentin [amoxicillin-pot clavulanate]   Review of Systems Review of Systems: negative unless otherwise stated in HPI.      Physical Exam Triage Vital Signs ED Triage Vitals  Enc Vitals Group     BP 08/22/22 1618 (!) 148/105     Pulse Rate 08/22/22 1618 78     Resp 08/22/22 1618 18     Temp 08/22/22 1618 98.4 F (36.9 C)     Temp Source 08/22/22 1618 Oral     SpO2 08/22/22 1618 99 %     Weight 08/22/22 1616 230 lb (104.3 kg)     Height 08/22/22 1616 '5\' 3"'$  (1.6 m)     Head Circumference --      Peak Flow --      Pain Score 08/22/22 1616 6     Pain Loc --      Pain Edu? --      Excl. in [place]? --    No data found.  Updated Vital Signs BP (!) 148/105 (BP Location: Left Arm)   Pulse 78   Temp 98.4 F (36.9 C) (Oral)   Resp 18   Ht '5\' 3"'$  (1.6 m)   Wt 104.3 kg   SpO2 99%   BMI 40.74 kg/m   Visual Acuity Right Eye Distance:   Left Eye Distance:   Bilateral Distance:    Right Eye Near:   Left Eye Near:    Bilateral Near:     Physical Exam GEN:     alert, non-toxic appearing female in no distress     HENT:  mucus membranes moist, no nasal discharge EYES:   pupils equal and reactive, EOMi ,  no scleral injection NECK:  normal ROM   RESP:  no increased work of breathing, left basilar coarse rales  CVS:   regular rate  and rhythm Skin:   warm and dry, no rash on visible skin , normal  skin  turgor    UC Treatments / Results  Labs (all labs ordered are listed, but only abnormal results are displayed) Labs Reviewed - No data to display  EKG   Radiology DG Chest 2 View  Result Date: 08/22/2022 CLINICAL DATA:  Cough. Chest tightness. Personal history of Hodgkin's disease and right-sided breast cancer. No active malignancy. EXAM: CHEST - 2 VIEW COMPARISON:  Two-view chest x-ray 2/8 chest 23 FINDINGS: Heart size is normal. Lung volumes are low. Asymmetric left lower lobe airspace disease is present. Lungs are otherwise clear. IMPRESSION: 1. Asymmetric left lower lobe airspace disease concerning for pneumonia. 2. Low lung volumes. Electronically Signed   By: San Morelle M.D.   On: 08/22/2022 16:29    Procedures Procedures (including critical care time)  Medications Ordered in UC Medications - No data to display  Initial Impression / Assessment and Plan / UC Course  I have reviewed the triage vital signs and the nursing notes.  Pertinent labs & imaging results that were available during my care of the patient were reviewed by me and considered in my medical decision making (see chart for details).       Pt is a 76 y.o. female who presents for 5 days of respiratory symptoms. Yazhini is  afebrile here without recent antipyretics. Satting well on room air. Overall pt is  non-toxic appearing, well hydrated, without respiratory distress. Pulmonary exam  is remarkable left basilar rales.  CXR obtained and concerning  for left lower lung pneumonia. Treat with antibiotics and cough medications as below. Albuterol for chest tightness. Typical duration of symptoms discussed.  Return and ED precautions given and patient voiced understanding. Discussed MDM, treatment plan and plan for follow-up with patient who agrees with plan.     Final Clinical Impressions(s) / UC Diagnoses   Final diagnoses:  Community acquired pneumonia of left lower lobe of lung     Discharge  Instructions      Stop by the pharmacy to pick up your prescriptions.  See handout on community acquired pneumonia. Follow up with your primary care provider as needed.  Go to ED for red flag symptoms, including; fevers you cannot reduce with Mary Todd Todd/Motrin, severe headaches, vision changes, numbness/weakness in part of the body, lethargy, confusion, intractable vomiting, severe dehydration, chest pain, breathing difficulty, severe persistent abdominal or pelvic pain, signs of severe infection (increased redness, swelling of an area), feeling faint or passing out, dizziness, etc. You should especially go to the ED for sudden acute worsening of condition if you do not elect to go at this time.       ED Prescriptions     Medication Sig Dispense Auth. Provider   clarithromycin (BIAXIN) 500 MG tablet Take 1 tablet (500 mg total) by mouth 2 (two) times daily for 5 days. 10 tablet Labrisha Wuellner, DO   promethazine-dextromethorphan (PROMETHAZINE-DM) 6.25-15 MG/5ML syrup Take 5 mLs by mouth 4 (four) times daily as needed. 118 mL Kalee Broxton, DO   albuterol (VENTOLIN HFA) 108 (90 Base) MCG/ACT inhaler Inhale 2 puffs into the lungs every 4 (four) hours as needed for wheezing or shortness of breath. 6.7 g Lyndee Hensen, DO      PDMP not reviewed this encounter.   Lyndee Hensen, DO 08/22/22 1713

## 2022-08-22 NOTE — Discharge Instructions (Signed)
Stop by the pharmacy to pick up your prescriptions.  See handout on community acquired pneumonia. Follow up with your primary care provider as needed.  Go to ED for red flag symptoms, including; fevers you cannot reduce with Tylenol/Motrin, severe headaches, vision changes, numbness/weakness in part of the body, lethargy, confusion, intractable vomiting, severe dehydration, chest pain, breathing difficulty, severe persistent abdominal or pelvic pain, signs of severe infection (increased redness, swelling of an area), feeling faint or passing out, dizziness, etc. You should especially go to the ED for sudden acute worsening of condition if you do not elect to go at this time.

## 2022-08-23 ENCOUNTER — Other Ambulatory Visit: Payer: Self-pay | Admitting: Family Medicine

## 2022-08-23 DIAGNOSIS — I10 Essential (primary) hypertension: Secondary | ICD-10-CM

## 2022-08-24 DIAGNOSIS — H35051 Retinal neovascularization, unspecified, right eye: Secondary | ICD-10-CM | POA: Diagnosis not present

## 2022-08-28 NOTE — Progress Notes (Signed)
Name: Mary Todd   MRN: [id number]    DOB: [birth year]   Date:08/29/2022       Progress Note  Subjective  Chief Complaint  Follow up from UC visit  HPI  Left lower pneumonia: patient developed a cough and chest congestion about 2 weeks ago, she went to urgent care on 08/22/2022, she had a CXR that showed lower left lobe pneumonia and sent home with a 5 day course of clarithromycin. She states the cough has improved but she had diarrhea ( resolved) and is having some chest congestion and some indigestion and has been taking Tums. She denies fever or chills. No wheezing or SOB. Feeling tired but appetite is normal.   Patient Active Problem List   Diagnosis Date Noted   Polyp of transverse colon    Total knee replacement status, right 10/03/2019   Encounter for screening colonoscopy 07/02/2018   Anterolisthesis 08/06/2017   DDD (degenerative disc disease), lumbosacral 08/06/2017   Chondromalacia patellae 07/03/2017   Osteoporosis 12/14/2016   Morbid obesity (H[facility]) 07/17/2016   Spinal stenosis in cervical region 05/22/2016   Thyroid nodule 05/22/2016   Spinal stenosis in cervical region 05/22/2016   GERD without esophagitis 03/16/2016   Neck muscle spasm 03/16/2016   History of back surgery 03/16/2016   Osteoarthritis of both knees 03/16/2016   Large breasts 03/16/2016   Hypertension, benign 03/16/2016   Perennial allergic rhinitis with seasonal variation 03/16/2016   History of pneumococcal pneumonia 09/20/2015   Hodgkin disease (HCC) 07/15/2014   Breast cancer, right (HCC) 06/26/2013    Past Surgical History:  Procedure Laterality Date   ABDOMINAL HYSTERECTOMY     BACK SURGERY     BREAST BIOPSY Right 2011   radiation   BREAST LUMPECTOMY Right 2011   w/ radiation   BREAST SURGERY Right 2011   right breast lumpectomy,L/SN/R for right breast CA    CATARACT EXTRACTION W/PHACO Left 06/10/2018   Procedure: CATARACT EXTRACTION PHACO AND INTRAOCULAR LENS PLACEMENT ([facility])  LEFT;  Surgeon: Nevada Crane, MD;  Location: [facility];  Service: Ophthalmology;  Laterality: Left;  Diabetic - oral meds   CATARACT EXTRACTION W/PHACO Right 07/15/2018   Procedure: CATARACT EXTRACTION PHACO AND INTRAOCULAR LENS PLACEMENT ([facility]) RIGHT;  Surgeon: Nevada Crane, MD;  Location: [facility];  Service: Ophthalmology;  Laterality: Right;   COLONOSCOPY  2003   COLONOSCOPY N/A 03/09/2022   Procedure: COLONOSCOPY WITH BIOPSY;  Surgeon: Midge Minium, MD;  Location: [facility];  Service: Endoscopy;  Laterality: N/A;   POLYPECTOMY N/A 03/09/2022   Procedure: POLYPECTOMY;  Surgeon: Midge Minium, MD;  Location: [facility];  Service: Endoscopy;  Laterality: N/A;   PORT A CATH REVISION     REPLACEMENT TOTAL KNEE Right 10/03/2019   DR. Odis Luster    WRIST SURGERY  2005    Family History  Problem Relation Age of Onset   Cancer Father    Bone cancer Father    Breast cancer Mother 10   Stroke Mother    Colon cancer Maternal Grandmother    Breast cancer Cousin     Social History   Tobacco Use   Smoking status: Never    Passive exposure: Never   Smokeless tobacco: Never   Tobacco comments:    smoking cessation materials not required  Substance Use Topics   Alcohol use: Yes    Comment: occasionally     Current Outpatient Medications:    acetaminophen (TYLENOL) 650 MG CR tablet, Take 1,300 mg  by mouth every 8 (eight) hours as needed for pain., Disp: , Rfl:    albuterol (VENTOLIN HFA) 108 (90 Base) MCG/ACT inhaler, Inhale 2 puffs into the lungs every 4 (four) hours as needed for wheezing or shortness of breath., Disp: 6.7 g, Rfl: 0   amLODipine-valsartan (EXFORGE) 5-160 MG tablet, Take 1 tablet by mouth daily., Disp: 90 tablet, Rfl: 1   atorvastatin (LIPITOR) 20 MG tablet, Take 1 tablet (20 mg total) by mouth daily., Disp: 90 tablet, Rfl: 1   buPROPion (WELLBUTRIN XL) 150 MG 24 hr tablet, Take 1 tablet (150 mg total) by mouth daily., Disp: 90  tablet, Rfl: 1   CALCIUM-MAGNESIUM PO, Take 1 tablet by mouth daily., Disp: , Rfl:    cetirizine (ZYRTEC) 10 MG tablet, Take 10 mg by mouth daily., Disp: , Rfl:    Cholecalciferol (VITAMIN D-3 PO), Take 1 tablet by mouth daily., Disp: , Rfl:    denosumab (PROLIA) 60 MG/ML SOSY injection, Inject 60 mg into the skin every 6 (six) months., Disp: , Rfl:    meloxicam (MOBIC) 15 MG tablet, Take 1 tablet by mouth once daily, Disp: 90 tablet, Rfl: 0   metoprolol succinate (TOPROL-XL) 50 MG 24 hr tablet, TAKE 1 TABLET BY MOUTH ONCE DAILY TAKE  WITH  OR  IMMEDIATELY  FOLLOWING  A  MEAL, Disp: 90 tablet, Rfl: 0   Multiple Vitamin (MULTIVITAMIN) tablet, Take 1 tablet by mouth daily., Disp: , Rfl:    nystatin powder, Apply topically 2 (two) times daily., Disp: 60 g, Rfl: 1   pantoprazole (PROTONIX) 40 MG tablet, Take 1 tablet (40 mg total) by mouth daily., Disp: 90 tablet, Rfl: 1   promethazine-dextromethorphan (PROMETHAZINE-DM) 6.25-15 MG/5ML syrup, Take 5 mLs by mouth 4 (four) times daily as needed., Disp: 118 mL, Rfl: 0   sertraline (ZOLOFT) 50 MG tablet, Take 1 tablet (50 mg total) by mouth every morning., Disp: 90 tablet, Rfl: 0   tolterodine (DETROL LA) 4 MG 24 hr capsule, Take 1 capsule (4 mg total) by mouth daily., Disp: 90 capsule, Rfl: 3  Allergies  Allergen Reactions   Shrimp [Shellfish Allergy] Hives   Augmentin [Amoxicillin-Pot Clavulanate] Nausea Only    I personally reviewed active problem list, medication list, allergies, family history, social history, health maintenance with the patient/caregiver today.   ROS  Ten systems reviewed and is negative except as mentioned in HPI   Objective  Vitals:   08/29/22 1133  BP: 126/72  Pulse: 84  Resp: 16  Temp: 98.1 F (36.7 C)  TempSrc: Oral  SpO2: 97%  Weight: 230 lb 12.8 oz (104.7 kg)  Height: 5\' 3"  (1.6 m)    Body mass index is 40.88 kg/m.  Physical Exam  Constitutional: Patient appears well-developed and well-nourished.  Obese  No distress.  HEENT: head atraumatic, normocephalic, pupils equal and reactive to light, ears normal TM, neck supple, throat within normal limits Cardiovascular: Normal rate, regular rhythm and normal heart sounds.  No murmur heard. No BLE edema. Pulmonary/Chest: Effort normal and breath sounds normal. No respiratory distress. Abdominal: Soft.  There is no tenderness. Psychiatric: Patient has a normal mood and affect. behavior is normal. Judgment and thought content normal.   Recent Results (from the past 2160 hour(s))  Basic metabolic panel     Status: Abnormal   Collection Time: 06/30/22 12:57 PM  Result Value Ref Range   Sodium 135 135 - 145 mmol/L   Potassium 4.0 3.5 - 5.1 mmol/L   Chloride 101 98 -  111 mmol/L   CO2 25 22 - 32 mmol/L   Glucose, Bld 101 (H) 70 - 99 mg/dL    Comment: Glucose reference range applies only to samples taken after fasting for at least 8 hours.   BUN 17 8 - 23 mg/dL   Creatinine, Ser 0.98 0.44 - 1.00 mg/dL   Calcium 9.0 8.9 - 11.9 mg/dL   GFR, Estimated >14 >78 mL/min    Comment: (NOTE) Calculated using the CKD-EPI Creatinine Equation (2021)    Anion gap 9 5 - 15    Comment: Performed at [facility], [address] [place], [place] [address]      PHQ2/9:    08/29/2022   11:35 AM 06/19/2022    9:57 AM 04/06/2022    3:39 PM 02/15/2022    9:42 AM 11/03/2021    3:26 PM  Depression screen PHQ 2/9  Decreased Interest 0 0 0 0 0  Down, Depressed, Hopeless 0 0 0 0 0  PHQ - 2 Score 0 0 0 0 0  Altered sleeping 0 0 0 0   Tired, decreased energy 0 0 0 0   Change in appetite 0 0 0 0   Feeling bad or failure about yourself  0 0 0 0   Trouble concentrating 0 0 0 0   Moving slowly or fidgety/restless 0 0 0 0   Suicidal thoughts 0 0 0 0   PHQ-9 Score 0 0 0 0     phq 9 is negative   Fall Risk:    08/29/2022   11:35 AM 06/19/2022    9:57 AM 04/06/2022    3:41 PM 02/15/2022    9:42 AM 10/11/2021    9:46 AM  Fall Risk   Falls in the past  year? 0 0 1 1 0  Number falls in past yr:  0 0 0 0  Injury with Fall?  0 0 0 0  Risk for fall due to : No Fall Risks No Fall Risks No Fall Risks No Fall Risks No Fall Risks  Follow up Falls prevention discussed;Education provided;Falls evaluation completed Falls prevention discussed Falls prevention discussed Falls prevention discussed Falls prevention discussed     Functional Status Survey: Is the patient deaf or have difficulty hearing?: No Does the patient have difficulty seeing, even when wearing glasses/contacts?: No Does the patient have difficulty concentrating, remembering, or making decisions?: No Does the patient have difficulty walking or climbing stairs?: No Does the patient have difficulty dressing or bathing?: No Does the patient have difficulty doing errands alone such as visiting a doctor's office or shopping?: No    Assessment & Plan  1. Community acquired pneumonia of left lower lobe of lung  - DG Chest 2 View; Future   We will recheck CXR in 6 weeks Check CBC and BMP  Normal exam today, explain she may have some gastritis from antibiotics. Try pepcid bid, mucinex and call back if no improvement of symptoms

## 2022-08-29 ENCOUNTER — Encounter: Payer: Self-pay | Admitting: Family Medicine

## 2022-08-29 ENCOUNTER — Ambulatory Visit (INDEPENDENT_AMBULATORY_CARE_PROVIDER_SITE_OTHER): Payer: Medicare Other | Admitting: Family Medicine

## 2022-08-29 VITALS — BP 126/72 | HR 84 | Temp 98.1°F | Resp 16 | Ht 63.0 in | Wt 230.8 lb

## 2022-08-29 DIAGNOSIS — J189 Pneumonia, unspecified organism: Secondary | ICD-10-CM

## 2022-08-30 LAB — CBC WITH DIFFERENTIAL/PLATELET
Absolute Monocytes: 580 cells/uL (ref 200–950)
Basophils Absolute: 18 cells/uL (ref 0–200)
Basophils Relative: 0.3 %
Eosinophils Absolute: 250 cells/uL (ref 15–500)
Eosinophils Relative: 4.1 %
HCT: 39.2 % (ref 35.0–45.0)
Hemoglobin: 13.3 g/dL (ref 11.7–15.5)
Lymphs Abs: 2245 cells/uL (ref 850–3900)
MCH: 30.1 pg (ref 27.0–33.0)
MCHC: 33.9 g/dL (ref 32.0–36.0)
MCV: 88.7 fL (ref 80.0–100.0)
MPV: 11.5 fL (ref 7.5–12.5)
Monocytes Relative: 9.5 %
Neutro Abs: 3007 cells/uL (ref 1500–7800)
Neutrophils Relative %: 49.3 %
Platelets: 222 10*3/uL (ref 140–400)
RBC: 4.42 10*6/uL (ref 3.80–5.10)
RDW: 13.1 % (ref 11.0–15.0)
Total Lymphocyte: 36.8 %
WBC: 6.1 10*3/uL (ref 3.8–10.8)

## 2022-08-30 LAB — BASIC METABOLIC PANEL WITH GFR
BUN: 21 mg/dL (ref 7–25)
CO2: 26 mmol/L (ref 20–32)
Calcium: 9.1 mg/dL (ref 8.6–10.4)
Chloride: 101 mmol/L (ref 98–110)
Creat: 0.74 mg/dL (ref 0.60–1.00)
Glucose, Bld: 99 mg/dL (ref 65–99)
Potassium: 5 mmol/L (ref 3.5–5.3)
Sodium: 136 mmol/L (ref 135–146)
eGFR: 84 mL/min/{1.73_m2} (ref >=60)

## 2022-08-31 ENCOUNTER — Ambulatory Visit: Payer: Medicare Other | Admitting: Family Medicine

## 2022-09-08 ENCOUNTER — Other Ambulatory Visit: Payer: Self-pay | Admitting: Family Medicine

## 2022-09-08 DIAGNOSIS — K219 Gastro-esophageal reflux disease without esophagitis: Secondary | ICD-10-CM

## 2022-09-27 ENCOUNTER — Other Ambulatory Visit: Payer: Self-pay | Admitting: Family Medicine

## 2022-09-27 DIAGNOSIS — F341 Dysthymic disorder: Secondary | ICD-10-CM

## 2022-10-10 ENCOUNTER — Other Ambulatory Visit: Payer: Self-pay | Admitting: Family Medicine

## 2022-10-10 DIAGNOSIS — M170 Bilateral primary osteoarthritis of knee: Secondary | ICD-10-CM

## 2022-11-01 ENCOUNTER — Other Ambulatory Visit: Payer: Self-pay | Admitting: Family Medicine

## 2022-11-01 DIAGNOSIS — I10 Essential (primary) hypertension: Secondary | ICD-10-CM

## 2022-11-01 DIAGNOSIS — I6782 Cerebral ischemia: Secondary | ICD-10-CM

## 2022-11-01 DIAGNOSIS — I700 Atherosclerosis of aorta: Secondary | ICD-10-CM

## 2022-11-02 ENCOUNTER — Encounter: Payer: Self-pay | Admitting: Hematology and Oncology

## 2022-11-02 ENCOUNTER — Ambulatory Visit
Admission: RE | Admit: 2022-11-02 | Discharge: 2022-11-02 | Disposition: A | Discharge status: Home / Self Care | Payer: Medicare Other | Source: Ambulatory Visit | Attending: Family Medicine | Admitting: Family Medicine

## 2022-11-02 ENCOUNTER — Ambulatory Visit
Admission: RE | Admit: 2022-11-02 | Discharge: 2022-11-02 | Disposition: A | Discharge status: Home / Self Care | Payer: Medicare Other | Attending: Family Medicine | Admitting: Family Medicine

## 2022-11-02 DIAGNOSIS — J189 Pneumonia, unspecified organism: Secondary | ICD-10-CM | POA: Diagnosis not present

## 2022-11-15 DIAGNOSIS — Z23 Encounter for immunization: Secondary | ICD-10-CM | POA: Diagnosis not present

## 2022-11-19 ENCOUNTER — Other Ambulatory Visit: Payer: Self-pay | Admitting: Family Medicine

## 2022-11-19 DIAGNOSIS — I10 Essential (primary) hypertension: Secondary | ICD-10-CM

## 2022-12-05 ENCOUNTER — Other Ambulatory Visit: Payer: Self-pay | Admitting: Family Medicine

## 2022-12-05 DIAGNOSIS — K219 Gastro-esophageal reflux disease without esophagitis: Secondary | ICD-10-CM

## 2022-12-12 ENCOUNTER — Encounter: Payer: Self-pay | Admitting: Hematology and Oncology

## 2022-12-13 ENCOUNTER — Encounter: Payer: Self-pay | Admitting: Hematology and Oncology

## 2022-12-19 NOTE — Progress Notes (Unsigned)
Name: Mary Todd   MRN: [id number]    DOB: Oct 16, 1946   Date:12/20/2022       Progress Note  Subjective  Chief Complaint  Follow Up  HPI  Pre-diabetes: she denies polyphagia, polydipsia or polyuria, last A1C was 5.8 % , she has been off Metformin .  Syncopal Episode: two episodes in 2022, seen by cardiologist and work up negative - Dr. Mylo Red thought likely vasovagal episode. She also saw Dr. Manuella Ghazi - neurologist and worked up essentially normal , she had a sleep study , per [facility] chart mild OSA and may consider auto Pap 5-20 cm H2O but she never started therapy . She feels tired all the time, discussed importance of weight loss and regular physical activity   DDD cervical spine: she had repeat MRI Oxly-spine , ordered by neurologist , they discussed referral psyatrist in the past  but she states pain is not bad enough and does not want referral at this time   IMPRESSION: c-spine done 04/2022  Chronic degenerative spondylosis from C3-4 through C7-T1, not  visibly worsened since 2017. No compressive canal stenosis.  Foraminal narrowing that could possibly be symptomatic on both sides  at the C5-6 level.   Worsening of disc degeneration at T1-2 with loss of disc height and  a shallow disc herniation more prominent in the left posterolateral  direction with proximal foraminal encroachment. This level was not  studied in the axial plane. Discogenic edema of the endplates could  relate to regional pain. There is probably proximal foraminal  encroachment on the left that could possibly affect the left T1  nerve.   Urge incontinence: . She is under the care of Urologist, taking Detrol and seems to be improving symptoms, she is also having post tibial neuro stimulation needle electrode therapy monthly - but stopped therapy when reached the desired outcome. Unchanged   HTN: she is taking  Exforge 5/160 and also metoprolol, tolerating medication well, no chest pain , dizziness SOB or  palpitation.   GERD: She is back on PPI daily , unable to tolerate skipping doses Stable.    OA: she had TKR right knee on Dec 4 th 2020 by Dr. Harlow Mares . She needs to take Meloxicam daily otherwise she feels very stiff, discussed importance of trying to take 7.5 mg but she has not tried yet Explained again importance of regular physical activity , she has also noticed some instability on left knee and discussed going back to Ortho    History Right Breast Cancer/hodgkins lymphoma : under the care of Dr. Tasia Catchings  she has been released from her surgeon. . Also diagnosed with hodgkin's lymphoma in 2004, breast cancer diagnosed in 2011  she stopped Arimidex Feb 2021 , up to date with follow ups and imaging Unchanged   Atherosclerosis aorta/ischemic brain disease : on recent CT abdomen, last LDLwas at goal down from 100 to 55 on current statin therapy  . MRI brain showed white matter ischemic brain disease.   Fatty liver and hepatomegaly: seen by GI and was advised to try losing weight. Unchanged   Osteoporosis: diagnosed in 2018 with AP spine score of -3.1 . She gets Prolia at the cancer center and she is tolerating medications well. Compliant, last bone density was done in 2022 , repeat in Dec 2024    Thyroid nodules: incidental finding on CT neck , had biopsy by  Dr. Gabriel Carina Dec 2017  and biopsy was negative, she was last seen 05/2020 and has been  released from her care  unchanged    Morbid obesity: BMI is over 40 now, she joined [employer] in June 2023 she states it did not help her lose weight , she states she could not follow the plan. She has also Go-lo. Discussed intermittent fasting   Dysthymia: she took Citalopram for years and weaned self off in 2022. She tried Wellbutrin and it improved her fatigue but we stopped due to syncopal episode and possibility of seizures, but she has be cleared by Dr. Manuella Ghazi and is back on medication, he also added Zoloft at 25 mg but she has noticed any improvement of  symptoms, we will try adjusting dose to 50 mg daily but not sure if taking. She is feeling tired, has lack of motivation. Discussed importance of trying therapy and regular physical activity   Intertrigo: she needs refills of powder and cream but not sure of the name of the cream  Rectal pain: she states she has intermittent sensation of rectal spasms that may last one minute and happens about twice a week. Not sure of triggers. Last colonoscopy was 2023   Patient Active Problem List   Diagnosis Date Noted   Polyp of transverse colon    Total knee replacement status, right 10/03/2019   Encounter for screening colonoscopy 07/02/2018   Anterolisthesis 08/06/2017   DDD (degenerative disc disease), lumbosacral 08/06/2017   Chondromalacia patellae 07/03/2017   Osteoporosis 12/14/2016   Morbid obesity ([place]) 07/17/2016   Spinal stenosis in cervical region 05/22/2016   Thyroid nodule 05/22/2016   Spinal stenosis in cervical region 05/22/2016   GERD without esophagitis 03/16/2016   Neck muscle spasm 03/16/2016   History of back surgery 03/16/2016   Osteoarthritis of both knees 03/16/2016   Large breasts 03/16/2016   Hypertension, benign 03/16/2016   Perennial allergic rhinitis with seasonal variation 03/16/2016   History of pneumococcal pneumonia 09/20/2015   Hodgkin disease ([place]) 07/15/2014   Breast cancer, right ([place]) 06/26/2013    Past Surgical History:  Procedure Laterality Date   ABDOMINAL HYSTERECTOMY     BACK SURGERY     BREAST BIOPSY Right 2011   radiation   BREAST LUMPECTOMY Right 2011   w/ radiation   BREAST SURGERY Right 2011   right breast lumpectomy,L/SN/R for right breast CA    CATARACT EXTRACTION W/PHACO Left 06/10/2018   Procedure: CATARACT EXTRACTION PHACO AND INTRAOCULAR LENS PLACEMENT ([place]) LEFT;  Surgeon: Eulogio Bear, MD;  Location: [facility];  Service: Ophthalmology;  Laterality: Left;  Diabetic - oral meds   CATARACT EXTRACTION W/PHACO Right  07/15/2018   Procedure: CATARACT EXTRACTION PHACO AND INTRAOCULAR LENS PLACEMENT ([place]) RIGHT;  Surgeon: Eulogio Bear, MD;  Location: [facility];  Service: Ophthalmology;  Laterality: Right;   COLONOSCOPY  2003   COLONOSCOPY N/A 03/09/2022   Procedure: COLONOSCOPY WITH BIOPSY;  Surgeon: Lucilla Lame, MD;  Location: [facility];  Service: Endoscopy;  Laterality: N/A;   POLYPECTOMY N/A 03/09/2022   Procedure: POLYPECTOMY;  Surgeon: Lucilla Lame, MD;  Location: [facility];  Service: Endoscopy;  Laterality: N/A;   PORT A CATH REVISION     REPLACEMENT TOTAL KNEE Right 10/03/2019   DR. Harlow Mares    WRIST SURGERY  2005    Family History  Problem Relation Age of Onset   Cancer Father    Bone cancer Father    Breast cancer Mother 49   Stroke Mother    Colon cancer Maternal Grandmother    Breast cancer Cousin  Social History   Tobacco Use   Smoking status: Never    Passive exposure: Never   Smokeless tobacco: Never   Tobacco comments:    smoking cessation materials not required  Substance Use Topics   Alcohol use: Yes    Comment: occasionally     Current Outpatient Medications:    acetaminophen (TYLENOL) 650 MG CR tablet, Take 1,300 mg by mouth every 8 (eight) hours as needed for pain., Disp: , Rfl:    CALCIUM-MAGNESIUM PO, Take 1 tablet by mouth daily., Disp: , Rfl:    cetirizine (ZYRTEC) 10 MG tablet, Take 10 mg by mouth daily., Disp: , Rfl:    Cholecalciferol (VITAMIN D-3 PO), Take 1 tablet by mouth daily., Disp: , Rfl:    denosumab (PROLIA) 60 MG/ML SOSY injection, Inject 60 mg into the skin every 6 (six) months., Disp: , Rfl:    Multiple Vitamin (MULTIVITAMIN) tablet, Take 1 tablet by mouth daily., Disp: , Rfl:    tolterodine (DETROL LA) 4 MG 24 hr capsule, Take 1 capsule (4 mg total) by mouth daily., Disp: 90 capsule, Rfl: 3   amLODipine-valsartan (EXFORGE) 5-160 MG tablet, Take 1 tablet by mouth daily., Disp: 90 tablet, Rfl: 0   atorvastatin  (LIPITOR) 20 MG tablet, Take 1 tablet (20 mg total) by mouth daily., Disp: 90 tablet, Rfl: 0   buPROPion (WELLBUTRIN XL) 150 MG 24 hr tablet, Take 1 tablet (150 mg total) by mouth daily., Disp: 90 tablet, Rfl: 0   meloxicam (MOBIC) 15 MG tablet, Take 1 tablet (15 mg total) by mouth daily., Disp: 90 tablet, Rfl: 0   metoprolol succinate (TOPROL-XL) 50 MG 24 hr tablet, Take 1 tablet (50 mg total) by mouth daily. Take with or immediately following a meal., Disp: 90 tablet, Rfl: 0   nystatin powder, Apply topically 2 (two) times daily., Disp: 60 g, Rfl: 1   pantoprazole (PROTONIX) 40 MG tablet, Take 1 tablet (40 mg total) by mouth daily., Disp: 90 tablet, Rfl: 0   sertraline (ZOLOFT) 50 MG tablet, Take 1 tablet (50 mg total) by mouth every morning., Disp: 90 tablet, Rfl: 0  Allergies  Allergen Reactions   Shrimp [Shellfish Allergy] Hives   Augmentin [Amoxicillin-Pot Clavulanate] Nausea Only    I personally reviewed active problem list, medication list, allergies, family history, social history, health maintenance with the patient/caregiver today.   ROS  Constitutional: Negative for fever or weight change.  Respiratory: Negative for cough and shortness of breath.   Cardiovascular: Negative for chest pain or palpitations.  Gastrointestinal: Negative for abdominal pain, no bowel changes.  Musculoskeletal: Negative for gait problem or joint swelling.  Skin: Negative for rash.  Neurological: Negative for dizziness or headache.  No other specific complaints in a complete review of systems (except as listed in HPI above).   Objective  Vitals:   12/20/22 0937  BP: 124/72  Pulse: 84  Resp: 18  Temp: 97.8 F (36.6 C)  TempSrc: Oral  SpO2: 96%  Weight: 235 lb 6.4 oz (106.8 kg)  Height: 5' 3"$  (1.6 m)    Body mass index is 41.7 kg/m.  Physical Exam  Constitutional: Patient appears well-developed and well-nourished. Obese  No distress.  HEENT: head atraumatic, normocephalic, pupils  equal and reactive to light, neck supple Cardiovascular: Normal rate, regular rhythm and normal heart sounds.  No murmur heard. No BLE edema. Pulmonary/Chest: Effort normal and breath sounds normal. No respiratory distress. Abdominal: Soft.  There is no tenderness. Psychiatric: Patient has a normal mood  and affect. behavior is normal. Judgment and thought content normal.    PHQ2/9:    12/20/2022    9:39 AM 08/29/2022   11:35 AM 06/19/2022    9:57 AM 04/06/2022    3:39 PM 02/15/2022    9:42 AM  Depression screen PHQ 2/9  Decreased Interest 2 0 0 0 0  Down, Depressed, Hopeless 2 0 0 0 0  PHQ - 2 Score 4 0 0 0 0  Altered sleeping 0 0 0 0 0  Tired, decreased energy 1 0 0 0 0  Change in appetite 0 0 0 0 0  Feeling bad or failure about yourself  1 0 0 0 0  Trouble concentrating 0 0 0 0 0  Moving slowly or fidgety/restless 0 0 0 0 0  Suicidal thoughts 0 0 0 0 0  PHQ-9 Score 6 0 0 0 0  Difficult doing work/chores Somewhat difficult        phq 9 is positive   Fall Risk:    12/20/2022    9:39 AM 08/29/2022   11:35 AM 06/19/2022    9:57 AM 04/06/2022    3:41 PM 02/15/2022    9:42 AM  Fall Risk   Falls in the past year? 1 0 0 1 1  Number falls in past yr: 0  0 0 0  Injury with Fall? 0  0 0 0  Risk for fall due to : History of fall(s) No Fall Risks No Fall Risks No Fall Risks No Fall Risks  Follow up Falls prevention discussed;Education provided;Falls evaluation completed Falls prevention discussed;Education provided;Falls evaluation completed Falls prevention discussed Falls prevention discussed Falls prevention discussed      Functional Status Survey: Is the patient deaf or have difficulty hearing?: No Does the patient have difficulty seeing, even when wearing glasses/contacts?: No Does the patient have difficulty concentrating, remembering, or making decisions?: No Does the patient have difficulty walking or climbing stairs?: Yes Does the patient have difficulty dressing or  bathing?: No Does the patient have difficulty doing errands alone such as visiting a doctor's office or shopping?: No    Assessment & Plan  1. Atherosclerosis of aorta (H[facility])  - atorvastatin (LIPITOR) 20 MG tablet; Take 1 tablet (20 mg total) by mouth daily.  Dispense: 90 tablet; Refill: 0  2. Hodgkin's disease, stage IIA ([place])  Under the care of oncologist - Dr. Tasia Catchings  3. Morbid obesity ([place])  Discussed with the patient the risk posed by an increased BMI. Discussed importance of portion control, calorie counting and at least 150 minutes of physical activity weekly. Avoid sweet beverages and drink more water. Eat at least 6 servings of fruit and vegetables daily    4. Essential hypertension  - amLODipine-valsartan (EXFORGE) 5-160 MG tablet; Take 1 tablet by mouth daily.  Dispense: 90 tablet; Refill: 0 - metoprolol succinate (TOPROL-XL) 50 MG 24 hr tablet; Take 1 tablet (50 mg total) by mouth daily. Take with or immediately following a meal.  Dispense: 90 tablet; Refill: 0  5. GERD without esophagitis  - pantoprazole (PROTONIX) 40 MG tablet; Take 1 tablet (40 mg total) by mouth daily.  Dispense: 90 tablet; Refill: 0  6. Ischemic demyelination of brain  - atorvastatin (LIPITOR) 20 MG tablet; Take 1 tablet (20 mg total) by mouth daily.  Dispense: 90 tablet; Refill: 0  7. Dysthymia  - buPROPion (WELLBUTRIN XL) 150 MG 24 hr tablet; Take 1 tablet (150 mg total) by mouth daily.  Dispense: 90 tablet; Refill: 0 -  sertraline (ZOLOFT) 50 MG tablet; Take 1 tablet (50 mg total) by mouth every morning.  Dispense: 90 tablet; Refill: 0  8. Primary osteoarthritis of both knees  - meloxicam (MOBIC) 15 MG tablet; Take 1 tablet (15 mg total) by mouth daily.  Dispense: 90 tablet; Refill: 0  9. Age-related osteoporosis without current pathological fracture  Continue infusion  10. Mixed incontinence   11. Pre-diabetes   12. Intertrigo  - nystatin powder; Apply topically 2 (two) times daily.   Dispense: 60 g; Refill: 1

## 2022-12-20 ENCOUNTER — Encounter: Payer: Self-pay | Admitting: Hematology and Oncology

## 2022-12-20 ENCOUNTER — Encounter: Payer: Self-pay | Admitting: Family Medicine

## 2022-12-20 ENCOUNTER — Ambulatory Visit (INDEPENDENT_AMBULATORY_CARE_PROVIDER_SITE_OTHER): Payer: Medicare Other | Admitting: Family Medicine

## 2022-12-20 VITALS — BP 124/72 | HR 84 | Temp 97.8°F | Resp 18 | Ht 63.0 in | Wt 235.4 lb

## 2022-12-20 DIAGNOSIS — M170 Bilateral primary osteoarthritis of knee: Secondary | ICD-10-CM

## 2022-12-20 DIAGNOSIS — N3946 Mixed incontinence: Secondary | ICD-10-CM

## 2022-12-20 DIAGNOSIS — C8190 Hodgkin lymphoma, unspecified, unspecified site: Secondary | ICD-10-CM | POA: Diagnosis not present

## 2022-12-20 DIAGNOSIS — K219 Gastro-esophageal reflux disease without esophagitis: Secondary | ICD-10-CM

## 2022-12-20 DIAGNOSIS — C50911 Malignant neoplasm of unspecified site of right female breast: Secondary | ICD-10-CM | POA: Diagnosis not present

## 2022-12-20 DIAGNOSIS — I700 Atherosclerosis of aorta: Secondary | ICD-10-CM

## 2022-12-20 DIAGNOSIS — M810 Age-related osteoporosis without current pathological fracture: Secondary | ICD-10-CM

## 2022-12-20 DIAGNOSIS — R739 Hyperglycemia, unspecified: Secondary | ICD-10-CM | POA: Diagnosis not present

## 2022-12-20 DIAGNOSIS — R5383 Other fatigue: Secondary | ICD-10-CM

## 2022-12-20 DIAGNOSIS — L304 Erythema intertrigo: Secondary | ICD-10-CM | POA: Diagnosis not present

## 2022-12-20 DIAGNOSIS — F341 Dysthymic disorder: Secondary | ICD-10-CM | POA: Diagnosis not present

## 2022-12-20 DIAGNOSIS — R7303 Prediabetes: Secondary | ICD-10-CM

## 2022-12-20 DIAGNOSIS — G3789 Other specified demyelinating diseases of central nervous system: Secondary | ICD-10-CM | POA: Diagnosis not present

## 2022-12-20 DIAGNOSIS — I10 Essential (primary) hypertension: Secondary | ICD-10-CM

## 2022-12-20 DIAGNOSIS — I6782 Cerebral ischemia: Secondary | ICD-10-CM

## 2022-12-20 MED ORDER — AMLODIPINE BESYLATE-VALSARTAN 5-160 MG PO TABS: 1.0000 | ORAL_TABLET | Freq: Every day | ORAL | 0 refills | Status: DC

## 2022-12-20 MED ORDER — ATORVASTATIN CALCIUM 20 MG PO TABS: 20.0000 mg | ORAL_TABLET | Freq: Every day | ORAL | 0 refills | Status: DC

## 2022-12-20 MED ORDER — BUPROPION HCL ER (XL) 150 MG PO TB24: 150.0000 mg | ORAL_TABLET | Freq: Every day | ORAL | 0 refills | Status: DC

## 2022-12-20 MED ORDER — METOPROLOL SUCCINATE ER 50 MG PO TB24: 50.0000 mg | ORAL_TABLET | Freq: Every day | ORAL | 0 refills | Status: DC

## 2022-12-20 MED ORDER — MELOXICAM 15 MG PO TABS: 15.0000 mg | ORAL_TABLET | Freq: Every day | ORAL | 0 refills | Status: DC

## 2022-12-20 MED ORDER — NYSTATIN 100000 UNIT/GM EX POWD: Freq: Two times a day (BID) | CUTANEOUS | 1 refills | Status: DC

## 2022-12-20 MED ORDER — SERTRALINE HCL 50 MG PO TABS: 50.0000 mg | ORAL_TABLET | Freq: Every morning | ORAL | 0 refills | Status: DC

## 2022-12-20 MED ORDER — PANTOPRAZOLE SODIUM 40 MG PO TBEC: 40.0000 mg | DELAYED_RELEASE_TABLET | Freq: Every day | ORAL | 0 refills | Status: DC

## 2022-12-20 NOTE — Addendum Note (Signed)
Addended by: Loistine Chance on: 12/20/2022 10:46 AM   Modules accepted: Orders

## 2022-12-21 LAB — B12 AND FOLATE PANEL
Folate: 11.3 ng/mL
Vitamin B-12: 331 pg/mL (ref 200–1100)

## 2022-12-21 LAB — HEMOGLOBIN A1C
Hgb A1c MFr Bld: 6.1 % of total Hgb — ABNORMAL HIGH (ref <5.7)
Mean Plasma Glucose: 128 mg/dL
eAG (mmol/L): 7.1 mmol/L

## 2022-12-21 LAB — COMPLETE METABOLIC PANEL WITH GFR
AG Ratio: 1.3 (calc) (ref 1.0–2.5)
ALT: 43 U/L — ABNORMAL HIGH (ref 6–29)
AST: 25 U/L (ref 10–35)
Albumin: 4.2 g/dL (ref 3.6–5.1)
Alkaline phosphatase (APISO): 91 U/L (ref 37–153)
BUN: 17 mg/dL (ref 7–25)
CO2: 29 mmol/L (ref 20–32)
Calcium: 9.5 mg/dL (ref 8.6–10.4)
Chloride: 101 mmol/L (ref 98–110)
Creat: 0.7 mg/dL (ref 0.60–1.00)
Globulin: 3.2 g/dL (calc) (ref 1.9–3.7)
Glucose, Bld: 97 mg/dL (ref 65–99)
Potassium: 4.7 mmol/L (ref 3.5–5.3)
Sodium: 137 mmol/L (ref 135–146)
Total Bilirubin: 0.5 mg/dL (ref 0.2–1.2)
Total Protein: 7.4 g/dL (ref 6.1–8.1)
eGFR: 90 mL/min/{1.73_m2} (ref >=60)

## 2022-12-21 LAB — LIPID PANEL
Cholesterol: 157 mg/dL (ref <200)
HDL: 80 mg/dL (ref >=50)
LDL Cholesterol (Calc): 63 mg/dL (calc)
Non-HDL Cholesterol (Calc): 77 mg/dL (calc) (ref <130)
Total CHOL/HDL Ratio: 2 (calc) (ref <5.0)
Triglycerides: 62 mg/dL (ref <150)

## 2022-12-21 LAB — VITAMIN D 25 HYDROXY (VIT D DEFICIENCY, FRACTURES): Vit D, 25-Hydroxy: 39 ng/mL (ref 30–100)

## 2022-12-27 ENCOUNTER — Other Ambulatory Visit: Payer: Self-pay

## 2022-12-27 DIAGNOSIS — C50911 Malignant neoplasm of unspecified site of right female breast: Secondary | ICD-10-CM

## 2022-12-28 ENCOUNTER — Inpatient Hospital Stay: Payer: Medicare Other

## 2022-12-28 ENCOUNTER — Inpatient Hospital Stay: Payer: Medicare Other | Admitting: Oncology

## 2022-12-28 ENCOUNTER — Inpatient Hospital Stay: Payer: Medicare Other | Attending: Oncology | Admitting: Oncology

## 2022-12-28 ENCOUNTER — Encounter: Payer: Self-pay | Admitting: Oncology

## 2022-12-28 VITALS — BP 158/96 | HR 77 | Temp 96.2°F | Wt 236.3 lb

## 2022-12-28 DIAGNOSIS — Z79811 Long term (current) use of aromatase inhibitors: Secondary | ICD-10-CM | POA: Diagnosis not present

## 2022-12-28 DIAGNOSIS — C50911 Malignant neoplasm of unspecified site of right female breast: Secondary | ICD-10-CM

## 2022-12-28 DIAGNOSIS — M810 Age-related osteoporosis without current pathological fracture: Secondary | ICD-10-CM

## 2022-12-28 DIAGNOSIS — Z853 Personal history of malignant neoplasm of breast: Secondary | ICD-10-CM | POA: Insufficient documentation

## 2022-12-28 DIAGNOSIS — Z8572 Personal history of non-Hodgkin lymphomas: Secondary | ICD-10-CM | POA: Insufficient documentation

## 2022-12-28 DIAGNOSIS — Z1231 Encounter for screening mammogram for malignant neoplasm of breast: Secondary | ICD-10-CM

## 2022-12-28 DIAGNOSIS — Z8571 Personal history of Hodgkin lymphoma: Secondary | ICD-10-CM | POA: Diagnosis not present

## 2022-12-28 DIAGNOSIS — E041 Nontoxic single thyroid nodule: Secondary | ICD-10-CM

## 2022-12-28 LAB — CBC WITH DIFFERENTIAL (CANCER CENTER ONLY)
Abs Immature Granulocytes: 0.05 10*3/uL (ref 0.00–0.07)
Basophils Absolute: 0 10*3/uL (ref 0.0–0.1)
Basophils Relative: 1 %
Eosinophils Absolute: 0.3 10*3/uL (ref 0.0–0.5)
Eosinophils Relative: 5 %
HCT: 37.4 % (ref 36.0–46.0)
Hemoglobin: 12.5 g/dL (ref 12.0–15.0)
Immature Granulocytes: 1 %
Lymphocytes Relative: 29 %
Lymphs Abs: 2.2 10*3/uL (ref 0.7–4.0)
MCH: 30.6 pg (ref 26.0–34.0)
MCHC: 33.4 g/dL (ref 30.0–36.0)
MCV: 91.4 fL (ref 80.0–100.0)
Monocytes Absolute: 0.6 10*3/uL (ref 0.1–1.0)
Monocytes Relative: 9 %
Neutro Abs: 4.2 10*3/uL (ref 1.7–7.7)
Neutrophils Relative %: 55 %
Platelet Count: 215 10*3/uL (ref 150–400)
RBC: 4.09 MIL/uL (ref 3.87–5.11)
RDW: 14 % (ref 11.5–15.5)
WBC Count: 7.4 10*3/uL (ref 4.0–10.5)
nRBC: 0 % (ref 0.0–0.2)

## 2022-12-28 LAB — CMP (CANCER CENTER ONLY)
ALT: 36 U/L (ref 0–44)
AST: 25 U/L (ref 15–41)
Albumin: 4 g/dL (ref 3.5–5.0)
Alkaline Phosphatase: 89 U/L (ref 38–126)
Anion gap: 9 (ref 5–15)
BUN: 17 mg/dL (ref 8–23)
CO2: 27 mmol/L (ref 22–32)
Calcium: 9.2 mg/dL (ref 8.9–10.3)
Chloride: 100 mmol/L (ref 98–111)
Creatinine: 0.7 mg/dL (ref 0.44–1.00)
GFR, Estimated: >60 mL/min (ref >60)
Glucose, Bld: 100 mg/dL — ABNORMAL HIGH (ref 70–99)
Potassium: 4.2 mmol/L (ref 3.5–5.1)
Sodium: 136 mmol/L (ref 135–145)
Total Bilirubin: 0.5 mg/dL (ref 0.3–1.2)
Total Protein: 7.5 g/dL (ref 6.5–8.1)

## 2022-12-28 LAB — LACTATE DEHYDROGENASE: LDH: 134 U/L (ref 98–192)

## 2022-12-28 MED ORDER — DENOSUMAB 60 MG/ML ~~LOC~~ SOSY
60.0000 mg | PREFILLED_SYRINGE | Freq: Once | SUBCUTANEOUS | Status: AC
  Administered 2022-12-28: 60 mg via SUBCUTANEOUS
  Filled 2022-12-28: qty 1

## 2022-12-28 NOTE — Assessment & Plan Note (Addendum)
#  Stage IA right breast cancer Clinically she is doing well except above symptoms Finished 10 years of antiestrogen treatments. Continue annual screening mammogram.

## 2022-12-28 NOTE — Progress Notes (Signed)
Hematology/Oncology Progress note [phone] [phone]    Referring physician: Steele Sizer, MD  ASSESSMENT & PLAN:   History of Hodgkin's lymphoma #History of stage II Hodgkin's lymphoma Patient has been in remission for many years. Labs reviewed and discussed with patient. LDH is normal. Clinically patient is doing well without any constitutional symptoms. Continue clinical surveillance.  Breast cancer, right ([place]) #Stage IA right breast cancer Clinically she is doing well except above symptoms Finished 10 years of antiestrogen treatments. Continue annual screening mammogram.  Osteoporosis #Osteopenia 05/05/2021, bone density showed stable osteopenia.  repeat in July 2024 Continue calcium and vitamin D supplementation Continue Prolia every 6 months- proceed today.   Thyroid nodule continue follow up with endocrinology Dr. Gabriel Carina and primary care provider.   Orders Placed This Encounter  Procedures   MM 3D SCREEN BREAST BILATERAL    Standing Status:   Future    Standing Expiration Date:   12/28/2023    Order Specific Question:   Reason for Exam (SYMPTOM  OR DIAGNOSIS REQUIRED)    Answer:   Breast Cancer    Order Specific Question:   Preferred imaging location?    Answer:   [facility]   DG Bone Density    Standing Status:   Future    Standing Expiration Date:   12/28/2023    Order Specific Question:   Reason for Exam (SYMPTOM  OR DIAGNOSIS REQUIRED)    Answer:   Breast cancer    Order Specific Question:   Preferred imaging location?    Answer:   [facility] Only    Standing Status:   Future    Standing Expiration Date:   12/28/2023   CBC with Differential (Cancer Center Only)    Standing Status:   Future    Standing Expiration Date:   12/28/2023   CMP ([facility] only)    Standing Status:   Future    Standing Expiration Date:   12/28/2023   Lactate dehydrogenase    Standing Status:    Future    Standing Expiration Date:   12/28/2023   Cancer antigen 27.29    Standing Status:   Future    Standing Expiration Date:   12/28/2023   Follow up  6 months lab - BMP + prolia  1 year lab MD same blood work + prolia  All questions were answered. The patient knows to call the clinic with any problems, questions or concerns.  Earlie Server, MD, PhD [facility] Hematology Oncology 12/28/2022    Chief Complaint: Nuvia Uhr is a 77 y.o. female with stage II Hodgkin's disease (2004) and stage IA right breast cancer (2011)  PERTINENT ONCOLOGY HISTORY Tyra Raisanen is a 77 y.o.afemale who has above oncology history reviewed by me today presented for follow up visit for management of stage II Hodgkin's disease (2004) and stage IA right breast cancer (2011)   Patient previously followed up by Dr.Corcoran, patient switched care to me on 04/14/2022 Extensive medical record review was performed by me  stage II Hodgkin's disease (2004) and stage IA right breast cancer (2011).   2004 stage IIA Hodgkin's disease after presenting with low counts and joint pain.  Abdomen and pelvic CT scan as well as PET scan revealed a pelvic mass and retroperitoneal lymphadenopathy.  Biopsy confirmed Hodgkin's disease.     She received ABVD times 5 through cycle 3-A and then AVD without the Bleomycin times 7 subsequent cycles.  Treatment completed in 12/2003.     CT and PET scans were negative in 05/2006 and in 06/2006.   07/21/2010. She underwent right breast lumpectomy and sentinel lymph node biopsy Pathology revealed a 0.5 cm grade II invasive ductal carcinoma.  Two sentinel lymph nodes were negative.  Tumor was ER + (>90%), PR + (40%), and Her2/neu 1+.  Pathologic stage was [id number].  She received Mammosite radiation (completed 08/22/2010).   Oncotype DX testing revealed a low risk lesion.  BRCA1/2 testing was negative.  She received 5 years of an aromatase inhibitor.   Breast cancer index  (BCI) testing on 07/23/2015 revealed a 6.3% (CI: 2.9%-9.6%) risk of late recurrent disease (years 5-10) after 5 years of hormonal therapy and a high likelihood of benefit from continued hormonal therapy (30% reduction).  Decision was made to continue Arimidex for 5 more years.   Bilateral screening mammogram on 07/13/2020 revealed no evidence of malignancy.    Thyroid ultrasound  on 04/13/2007 revealed a 2.3 cm solid and cystic nodule within the left thyroid gland. There was also a small nodule within the right thyroid gland that did not meet size criteria for measurement. There was a single benign appearing lymph node within the right neck.  FNA in 06/2016 was  "indeterminate" and in 08/2016 was "benign".     Bone density study on 09/05/2011 revealed osteopenia with a  T-score of -1.7 in L1-L4.  Bone density study on 11/14/2012 revealed osteopenia with a  T-score of -1.5 in L1-L4.  Bone density study on 12/14/2016 revealed osteoporosis in L2-3 with a T-score of -3.1.  Left femur normal with T-score of -0.8.  Bone density on 05/05/2019 revealed osteopenia with a T-score of -2.4 in the AP spine L2-L3.  She began Prolia on 01/24/2017, every 6 months 04/21/2021, CT scan showed no pathologically enlarged abnormal or pelvic lymph nodes and no evidence of metastatic disease within the chest abdomen and pelvis.  Left-sided thyroid nodule 1.6 cm.  Mild hepatomegaly with diffuse hepatic steatosis.  Left-sided colonic diverticulosis without findings of acute diverticulitis.  She has finished 10 years of Arimidex and is stopped in March 2022.  INTERVAL HISTORY Shekima Kiester is a 77 y.o. female who has above history reviewed by me today for follow-up of history of Hodgkin's lymphoma, history of breast cancer. Night sweats fatigue improved after started on Zoloft Today patient reports feeling well.  She has no new breast concerns.  .  Review of Systems  Constitutional:  Positive for fatigue.  Negative for appetite change, chills and fever.  HENT:   Negative for hearing loss and voice change.   Eyes:  Negative for eye problems.  Respiratory:  Negative for chest tightness and cough.   Cardiovascular:  Negative for chest pain.  Gastrointestinal:  Negative for abdominal distention, abdominal pain and blood in stool.  Endocrine: Negative for hot flashes.  Genitourinary:  Negative for difficulty urinating and frequency.   Musculoskeletal:  Negative for arthralgias.  Skin:  Negative for itching and rash.  Neurological:  Negative for extremity weakness.  Hematological:  Negative for adenopathy.  Psychiatric/Behavioral:  Negative for confusion.      Past Medical History:  Diagnosis Date   Allergy    Arthritis    knees, lower back   Cancer [facility])    Breast cancer   Esophageal reflux    Hypertension 2006   Osteoporosis    Personal history of malignant neoplasm of breast 2011   right breast   Personal history  of radiation therapy 2011   RIGHT lumpectomy w/ radiation    Past Surgical History:  Procedure Laterality Date   ABDOMINAL HYSTERECTOMY     BACK SURGERY     BREAST BIOPSY Right 2011   radiation   BREAST LUMPECTOMY Right 2011   w/ radiation   BREAST SURGERY Right 2011   right breast lumpectomy,L/SN/R for right breast CA    CATARACT EXTRACTION W/PHACO Left 06/10/2018   Procedure: CATARACT EXTRACTION PHACO AND INTRAOCULAR LENS PLACEMENT ([place]) LEFT;  Surgeon: Eulogio Bear, MD;  Location: [place];  Service: Ophthalmology;  Laterality: Left;  Diabetic - oral meds   CATARACT EXTRACTION W/PHACO Right 07/15/2018   Procedure: CATARACT EXTRACTION PHACO AND INTRAOCULAR LENS PLACEMENT ([place]) RIGHT;  Surgeon: Eulogio Bear, MD;  Location: [place];  Service: Ophthalmology;  Laterality: Right;   COLONOSCOPY  2003   COLONOSCOPY N/A 03/09/2022   Procedure: COLONOSCOPY WITH BIOPSY;  Surgeon: Lucilla Lame, MD;  Location: [place];  Service:  Endoscopy;  Laterality: N/A;   POLYPECTOMY N/A 03/09/2022   Procedure: POLYPECTOMY;  Surgeon: Lucilla Lame, MD;  Location: [place];  Service: Endoscopy;  Laterality: N/A;   PORT A CATH REVISION     REPLACEMENT TOTAL KNEE Right 10/03/2019   DR. Neoga  2005    Family History  Problem Relation Age of Onset   Cancer Father    Bone cancer Father    Breast cancer Mother 74   Stroke Mother    Colon cancer Maternal Grandmother    Breast cancer Cousin     Social History:  reports that she has never smoked. She has never been exposed to tobacco smoke. She has never used smokeless tobacco. She reports current alcohol use. She reports that she does not use drugs.The patient is alone today.  Allergies:  Allergies  Allergen Reactions   Shrimp [Shellfish Allergy] Hives   Augmentin [Amoxicillin-Pot Clavulanate] Nausea Only    Current Medications: Current Outpatient Medications  Medication Sig Dispense Refill   acetaminophen (TYLENOL) 650 MG CR tablet Take 1,300 mg by mouth every 8 (eight) hours as needed for pain.     amLODipine-valsartan (EXFORGE) 5-160 MG tablet Take 1 tablet by mouth daily. 90 tablet 0   atorvastatin (LIPITOR) 20 MG tablet Take 1 tablet (20 mg total) by mouth daily. 90 tablet 0   buPROPion (WELLBUTRIN XL) 150 MG 24 hr tablet Take 1 tablet (150 mg total) by mouth daily. 90 tablet 0   CALCIUM-MAGNESIUM PO Take 1 tablet by mouth daily.     cetirizine (ZYRTEC) 10 MG tablet Take 10 mg by mouth daily.     Cholecalciferol (VITAMIN D-3 PO) Take 1 tablet by mouth daily.     denosumab (PROLIA) 60 MG/ML SOSY injection Inject 60 mg into the skin every 6 (six) months.     meloxicam (MOBIC) 15 MG tablet Take 1 tablet (15 mg total) by mouth daily. 90 tablet 0   metoprolol succinate (TOPROL-XL) 50 MG 24 hr tablet Take 1 tablet (50 mg total) by mouth daily. Take with or immediately following a meal. 90 tablet 0   Multiple Vitamin (MULTIVITAMIN) tablet Take 1  tablet by mouth daily.     nystatin powder Apply topically 2 (two) times daily. 60 g 1   pantoprazole (PROTONIX) 40 MG tablet Take 1 tablet (40 mg total) by mouth daily. 90 tablet 0   sertraline (ZOLOFT) 50 MG tablet Take 1 tablet (50 mg total) by  mouth every morning. 90 tablet 0   tolterodine (DETROL LA) 4 MG 24 hr capsule Take 1 capsule (4 mg total) by mouth daily. 90 capsule 3   No current facility-administered medications for this visit.     Performance status (ECOG): 0  Vitals Blood pressure (!) 158/96, pulse 77, temperature (!) 96.2 F (35.7 C), temperature source Tympanic, weight 236 lb 4.8 oz (107.2 kg), SpO2 94 %.  Physical Exam Constitutional:      General: She is not in acute distress.    Appearance: She is obese. She is not diaphoretic.  HENT:     Head: Normocephalic and atraumatic.     Nose: Nose normal.     Mouth/Throat:     Pharynx: No oropharyngeal exudate.  Eyes:     General: No scleral icterus.    Pupils: Pupils are equal, round, and reactive to light.  Cardiovascular:     Rate and Rhythm: Normal rate and regular rhythm.     Heart sounds: No murmur heard. Pulmonary:     Effort: Pulmonary effort is normal. No respiratory distress.     Breath sounds: No rales.  Chest:     Chest wall: No tenderness.  Abdominal:     General: There is no distension.     Palpations: Abdomen is soft.     Tenderness: There is no abdominal tenderness.  Musculoskeletal:        General: Normal range of motion.     Cervical back: Normal range of motion and neck supple.  Skin:    General: Skin is warm and dry.     Findings: No erythema.  Neurological:     Mental Status: She is alert and oriented to person, place, and time.     Cranial Nerves: No cranial nerve deficit.     Motor: No abnormal muscle tone.     Coordination: Coordination normal.  Psychiatric:        Mood and Affect: Mood and affect normal.   Patient declines breast examination.   Laboratory findings     Latest Ref Rng & Units 12/28/2022    9:27 AM 08/29/2022   11:57 AM 12/29/2021    1:15 PM  CBC  WBC 4.0 - 10.5 K/uL 7.4  6.1  7.6   Hemoglobin 12.0 - 15.0 g/dL 12.5  13.3  12.7   Hematocrit 36.0 - 46.0 % 37.4  39.2  37.8   Platelets 150 - 400 K/uL 215  222  209         Latest Ref Rng & Units 12/28/2022    9:27 AM 12/20/2022   10:51 AM 08/29/2022   11:57 AM  CMP  Glucose 70 - 99 mg/dL 100  97  99   BUN 8 - 23 mg/dL '17  17  21   '$ Creatinine 0.44 - 1.00 mg/dL 0.70  0.70  0.74   Sodium 135 - 145 mmol/L 136  137  136   Potassium 3.5 - 5.1 mmol/L 4.2  4.7  5.0   Chloride 98 - 111 mmol/L 100  101  101   CO2 22 - 32 mmol/L '27  29  26   '$ Calcium 8.9 - 10.3 mg/dL 9.2  9.5  9.1   Total Protein 6.5 - 8.1 g/dL 7.5  7.4    Total Bilirubin 0.3 - 1.2 mg/dL 0.5  0.5    Alkaline Phos 38 - 126 U/L 89     AST 15 - 41 U/L 25  25    ALT 0 -  44 U/L 36  43

## 2022-12-28 NOTE — Assessment & Plan Note (Addendum)
#  History of stage II Hodgkin's lymphoma Patient has been in remission for many years. Labs reviewed and discussed with patient. LDH is normal. Clinically patient is doing well without any constitutional symptoms. Continue clinical surveillance.

## 2022-12-28 NOTE — Assessment & Plan Note (Addendum)
#  Osteopenia 05/05/2021, bone density showed stable osteopenia.  repeat in July 2024 Continue calcium and vitamin D supplementation Continue Prolia every 6 months- proceed today.

## 2022-12-28 NOTE — Assessment & Plan Note (Signed)
continue follow up with endocrinology Dr. Gabriel Carina and primary care provider.

## 2022-12-30 LAB — CANCER ANTIGEN 27.29: CA 27.29: 11.3 U/mL (ref 0.0–38.6)

## 2023-01-01 ENCOUNTER — Inpatient Hospital Stay: Payer: Medicare Other

## 2023-01-01 ENCOUNTER — Inpatient Hospital Stay: Payer: Medicare Other | Admitting: Oncology

## 2023-01-09 NOTE — Progress Notes (Signed)
[facility] Quality Team Note  Name: Mary Todd Date of Birth: June 15, 1946 MRN: [id number] Date: 01/09/2023  [facility] Quality Team has reviewed this patient's chart, please see recommendations below:  [facility] Quality Other; ([place] BLOOD PRESSURE UNDER 140/90 FOR GAP CLOSURE. PATIENT HAS APPT 03/21/2023 WITH PCP, PLEASE ADDRESS DURING OFFICE VISIT.)

## 2023-02-20 ENCOUNTER — Other Ambulatory Visit: Payer: Self-pay | Admitting: Family Medicine

## 2023-02-20 DIAGNOSIS — L304 Erythema intertrigo: Secondary | ICD-10-CM

## 2023-02-22 DIAGNOSIS — Z961 Presence of intraocular lens: Secondary | ICD-10-CM | POA: Diagnosis not present

## 2023-02-22 DIAGNOSIS — H35051 Retinal neovascularization, unspecified, right eye: Secondary | ICD-10-CM | POA: Diagnosis not present

## 2023-02-22 DIAGNOSIS — H31002 Unspecified chorioretinal scars, left eye: Secondary | ICD-10-CM | POA: Diagnosis not present

## 2023-03-18 ENCOUNTER — Other Ambulatory Visit: Payer: Self-pay | Admitting: Family Medicine

## 2023-03-18 DIAGNOSIS — L304 Erythema intertrigo: Secondary | ICD-10-CM

## 2023-03-20 NOTE — Progress Notes (Unsigned)
Name: Mary Todd   MRN: [id number]    DOB: December 18, 1945   Date:03/21/2023       Progress Note  Subjective  Chief Complaint  Follow Up  HPI  Pre-diabetes: she denies polyphagia, polydipsia or polyuria, last A1C was  up from 5.8 %  to 6.1 % , she used to take Metformin years ago.   Syncopal Episode: two episodes in 2022, seen by cardiologist and work up negative - Dr. Myriam Forehand thought likely vasovagal episode. She also saw Dr. Sherryll Burger - neurologist and worked up essentially normal , she had a sleep study , per [facility] chart mild OSA and may consider auto Pap 5-20 cm H2O but she never started therapy . Unchanged   DDD cervical spine/Thoracic back pain: she had repeat MRI [facility]-spine , ordered by neurologist , they discussed referral psyatrist in the past  but she states pain is not bad enough  on her neck but her lower back has been painful all the time and radiates to buttocks, she used to go to a pain clinic in [place] and would like to go back now   IMPRESSION: c-spine done 04/2022  Chronic degenerative spondylosis from C3-4 through C7-T1, not  visibly worsened since 2017. No compressive canal stenosis.  Foraminal narrowing that could possibly be symptomatic on both sides  at the C5-6 level.   Worsening of disc degeneration at T1-2 with loss of disc height and  a shallow disc herniation more prominent in the left posterolateral  direction with proximal foraminal encroachment. This level was not  studied in the axial plane. Discogenic edema of the endplates could  relate to regional pain. There is probably proximal foraminal  encroachment on the left that could possibly affect the left T1  nerve.   Urge incontinence: . She is under the care of Urologist, taking Detrol and seems to be improving symptoms, she is also having post tibial neuro stimulation needle electrode therapy monthly - but stopped therapy when reached the desired outcome. She states having to wear pads at night now    HTN: she is taking  Exforge 5/160 and also metoprolol, tolerating medication well, no chest pain , dizziness SOB or palpitation.BP is at goal today    GERD: She is back on PPI daily , unable to tolerate skipping doses Stable.    OA: she had TKR right knee on Dec 4 th 2020 by Dr. Odis Luster . She needs to take Meloxicam daily otherwise she feels very stiff, discussed importance of trying to take 7.5 mg but she has not tried yet Explained again importance of regular physical activity , she has also noticed some instability on left knee and discussed going back to Ortho    History Right Breast Cancer/hodgkins lymphoma : under the care of Dr. Cathie Hoops  she has been released from her surgeon. . Also diagnosed with hodgkin's lymphoma in 2004, breast cancer diagnosed in 2011  she stopped Arimidex Feb 2021 , up to date with follow ups and imaging  Reviewed labs done recently at cancer center   Atherosclerosis aorta/ischemic brain disease : on recent CT abdomen, last LDLwas at goal down from 100 to 55 on current statin therapy  . MRI brain showed white matter ischemic brain disease. Unchanged   Fatty liver and hepatomegaly: seen by GI and was advised to try losing weight. Reminded her of that   Osteoporosis: diagnosed in 2018 with AP spine score of -3.1 . She gets Prolia at the cancer center and she is  tolerating medications well. Compliant, last bone density was done in 2022 , repeat in Dec 2024    Thyroid nodules: incidental finding on CT neck , had biopsy by  Dr. Tedd Sias Dec 2017  and biopsy was negative, she was last seen 05/2020 and has been released from her care. Unchanged    Morbid obesity: BMI is over 40 now, she joined [employer] in June 2023 she states it did not help her lose weight , she states  the lack of motivation is not helping her lose weight   Dysthymia: she took Citalopram for years and weaned self off in 2022. She tried Wellbutrin and it improved her fatigue but we stopped due to syncopal  episode and possibility of seizures, but she has be cleared by Dr. Sherryll Burger and is back on medication, he also added Zoloft at 25 mg but she has noticed any improvement of symptoms, we will try adjusting dose to 50 mg daily and also on Wellbutrin XL 150 mg  , she states not as irritable lately but still has lack of motivation and we will increase dose of zoloft today    Patient Active Problem List   Diagnosis Date Noted   Polyp of transverse colon    Total knee replacement status, right 10/03/2019   Encounter for screening colonoscopy 07/02/2018   Anterolisthesis 08/06/2017   DDD (degenerative disc disease), lumbosacral 08/06/2017   Chondromalacia patellae 07/03/2017   Osteoporosis 12/14/2016   Morbid obesity (HCC) 07/17/2016   Spinal stenosis in cervical region 05/22/2016   Thyroid nodule 05/22/2016   Spinal stenosis in cervical region 05/22/2016   GERD without esophagitis 03/16/2016   Neck muscle spasm 03/16/2016   History of back surgery 03/16/2016   Osteoarthritis of both knees 03/16/2016   Large breasts 03/16/2016   Hypertension, benign 03/16/2016   Perennial allergic rhinitis with seasonal variation 03/16/2016   History of pneumococcal pneumonia 09/20/2015   History of Hodgkin's lymphoma 07/15/2014   Breast cancer, right (HCC) 06/26/2013    Past Surgical History:  Procedure Laterality Date   ABDOMINAL HYSTERECTOMY     BACK SURGERY     BREAST BIOPSY Right 2011   radiation   BREAST LUMPECTOMY Right 2011   w/ radiation   BREAST SURGERY Right 2011   right breast lumpectomy,L/SN/R for right breast CA    CATARACT EXTRACTION W/PHACO Left 06/10/2018   Procedure: CATARACT EXTRACTION PHACO AND INTRAOCULAR LENS PLACEMENT (IOC) LEFT;  Surgeon: Nevada Crane, MD;  Location: [facility];  Service: Ophthalmology;  Laterality: Left;  Diabetic - oral meds   CATARACT EXTRACTION W/PHACO Right 07/15/2018   Procedure: CATARACT EXTRACTION PHACO AND INTRAOCULAR LENS PLACEMENT (IOC)  RIGHT;  Surgeon: Nevada Crane, MD;  Location: [facility];  Service: Ophthalmology;  Laterality: Right;   COLONOSCOPY  2003   COLONOSCOPY N/A 03/09/2022   Procedure: COLONOSCOPY WITH BIOPSY;  Surgeon: Midge Minium, MD;  Location: [facility];  Service: Endoscopy;  Laterality: N/A;   POLYPECTOMY N/A 03/09/2022   Procedure: POLYPECTOMY;  Surgeon: Midge Minium, MD;  Location: [facility];  Service: Endoscopy;  Laterality: N/A;   PORT A CATH REVISION     REPLACEMENT TOTAL KNEE Right 10/03/2019   DR. Odis Luster    WRIST SURGERY  2005    Family History  Problem Relation Age of Onset   Cancer Father    Bone cancer Father    Breast cancer Mother 20   Stroke Mother    Colon cancer Maternal Grandmother  Breast cancer Cousin     Social History   Tobacco Use   Smoking status: Never    Passive exposure: Never   Smokeless tobacco: Never   Tobacco comments:    smoking cessation materials not required  Substance Use Topics   Alcohol use: Yes    Comment: occasionally     Current Outpatient Medications:    acetaminophen (TYLENOL) 650 MG CR tablet, Take 1,300 mg by mouth every 8 (eight) hours as needed for pain., Disp: , Rfl:    amLODipine-valsartan (EXFORGE) 5-160 MG tablet, Take 1 tablet by mouth daily., Disp: 90 tablet, Rfl: 0   atorvastatin (LIPITOR) 20 MG tablet, Take 1 tablet (20 mg total) by mouth daily., Disp: 90 tablet, Rfl: 0   buPROPion (WELLBUTRIN XL) 150 MG 24 hr tablet, Take 1 tablet (150 mg total) by mouth daily., Disp: 90 tablet, Rfl: 0   CALCIUM-MAGNESIUM PO, Take 1 tablet by mouth daily., Disp: , Rfl:    cetirizine (ZYRTEC) 10 MG tablet, Take 10 mg by mouth daily., Disp: , Rfl:    Cholecalciferol (VITAMIN D-3 PO), Take 1 tablet by mouth daily., Disp: , Rfl:    denosumab (PROLIA) 60 MG/ML SOSY injection, Inject 60 mg into the skin every 6 (six) months., Disp: , Rfl:    meloxicam (MOBIC) 15 MG tablet, Take 1 tablet (15 mg total) by mouth daily.,  Disp: 90 tablet, Rfl: 0   metoprolol succinate (TOPROL-XL) 50 MG 24 hr tablet, Take 1 tablet (50 mg total) by mouth daily. Take with or immediately following a meal., Disp: 90 tablet, Rfl: 0   Multiple Vitamin (MULTIVITAMIN) tablet, Take 1 tablet by mouth daily., Disp: , Rfl:    nystatin (MYCOSTATIN/NYSTOP) powder, APPLY  POWDER TOPICALLY TWICE DAILY, Disp: 60 g, Rfl: 0   pantoprazole (PROTONIX) 40 MG tablet, Take 1 tablet (40 mg total) by mouth daily., Disp: 90 tablet, Rfl: 0   sertraline (ZOLOFT) 50 MG tablet, Take 1 tablet (50 mg total) by mouth every morning., Disp: 90 tablet, Rfl: 0   tolterodine (DETROL LA) 4 MG 24 hr capsule, Take 1 capsule (4 mg total) by mouth daily., Disp: 90 capsule, Rfl: 3  Allergies  Allergen Reactions   Shrimp [Shellfish Allergy] Hives   Augmentin [Amoxicillin-Pot Clavulanate] Nausea Only    I personally reviewed active problem list, medication list, allergies, family history, social history, health maintenance with the patient/caregiver today.   ROS  Constitutional: Negative for fever or weight change.  Respiratory: Negative for cough and shortness of breath.   Cardiovascular: Negative for chest pain or palpitations.  Gastrointestinal: Negative for abdominal pain, no bowel changes.  Musculoskeletal: Negative for gait problem or joint swelling.  Skin: Negative for rash.  Neurological: Negative for dizziness or headache.  No other specific complaints in a complete review of systems (except as listed in HPI above).   Objective  Vitals:   03/21/23 1020  BP: 136/84  Pulse: 84  Resp: 16  Temp: 97.8 F (36.6 C)  TempSrc: Oral  SpO2: 94%  Weight: 238 lb (108 kg)  Height: 5\' 3"  (1.6 m)    Body mass index is 42.16 kg/m.  Physical Exam  Constitutional: Patient appears well-developed and well-nourished. Obese  No distress.  HEENT: head atraumatic, normocephalic, pupils equal and reactive to light,, neck supple Cardiovascular: Normal rate, regular  rhythm and normal heart sounds.  No murmur heard. No BLE edema. Pulmonary/Chest: Effort normal and breath sounds normal. No respiratory distress. Abdominal: Soft.  There is no tenderness.  Psychiatric: Patient has a normal mood and affect. behavior is normal. Judgment and thought content normal.   Recent Results (from the past 2160 hour(s))  Cancer antigen 27.29     Status: None   Collection Time: 12/28/22  9:27 AM  Result Value Ref Range   CA 27.29 11.3 0.0 - 38.6 U/mL    Comment: (NOTE) Siemens Centaur Immunochemiluminometric Methodology (ICMA) Values obtained with different assay methods or kits cannot be used interchangeably. Results cannot be interpreted as absolute evidence of the presence or absence of malignant disease. Performed At: [facility] [address] [place], [place] [id number] Jolene Schimke MD WU:[id number]   Lactate dehydrogenase     Status: None   Collection Time: 12/28/22  9:27 AM  Result Value Ref Range   LDH 134 98 - 192 U/L    Comment: Performed at [facility], [address] [place], [place] [address]  CMP (Cancer Center only)     Status: Abnormal   Collection Time: 12/28/22  9:27 AM  Result Value Ref Range   Sodium 136 135 - 145 mmol/L   Potassium 4.2 3.5 - 5.1 mmol/L   Chloride 100 98 - 111 mmol/L   CO2 27 22 - 32 mmol/L   Glucose, Bld 100 (H) 70 - 99 mg/dL    Comment: Glucose reference range applies only to samples taken after fasting for at least 8 hours.   BUN 17 8 - 23 mg/dL   Creatinine 3.08 6.57 - 1.00 mg/dL   Calcium 9.2 8.9 - 84.6 mg/dL   Total Protein 7.5 6.5 - 8.1 g/dL   Albumin 4.0 3.5 - 5.0 g/dL   AST 25 15 - 41 U/L   ALT 36 0 - 44 U/L   Alkaline Phosphatase 89 38 - 126 U/L   Total Bilirubin 0.5 0.3 - 1.2 mg/dL   GFR, Estimated >96 >29 mL/min    Comment: (NOTE) Calculated using the CKD-EPI Creatinine Equation (2021)    Anion gap 9 5 - 15    Comment: Performed at [facility], [address]  [place], [place] [address]  CBC with Differential (Cancer Center Only)     Status: None   Collection Time: 12/28/22  9:27 AM  Result Value Ref Range   WBC Count 7.4 4.0 - 10.5 K/uL   RBC 4.09 3.87 - 5.11 MIL/uL   Hemoglobin 12.5 12.0 - 15.0 g/dL   HCT 32.4 40.1 - 02.7 %   MCV 91.4 80.0 - 100.0 fL   MCH 30.6 26.0 - 34.0 pg   MCHC 33.4 30.0 - 36.0 g/dL   RDW 25.3 66.4 - 40.3 %   Platelet Count 215 150 - 400 K/uL   nRBC 0.0 0.0 - 0.2 %   Neutrophils Relative % 55 %   Neutro Abs 4.2 1.7 - 7.7 K/uL   Lymphocytes Relative 29 %   Lymphs Abs 2.2 0.7 - 4.0 K/uL   Monocytes Relative 9 %   Monocytes Absolute 0.6 0.1 - 1.0 K/uL   Eosinophils Relative 5 %   Eosinophils Absolute 0.3 0.0 - 0.5 K/uL   Basophils Relative 1 %   Basophils Absolute 0.0 0.0 - 0.1 K/uL   Immature Granulocytes 1 %   Abs Immature Granulocytes 0.05 0.00 - 0.07 K/uL    Comment: Performed at  [facility], [address] [place], [place] [address]    PHQ2/9:    03/21/2023   10:22 AM 12/20/2022    9:39 AM 08/29/2022   11:35 AM  06/19/2022    9:57 AM 04/06/2022    3:39 PM  Depression screen PHQ 2/9  Decreased Interest 0 2 0 0 0  Down, Depressed, Hopeless 0 2 0 0 0  PHQ - 2 Score 0 4 0 0 0  Altered sleeping 0 0 0 0 0  Tired, decreased energy 0 1 0 0 0  Change in appetite 0 0 0 0 0  Feeling bad or failure about yourself  0 1 0 0 0  Trouble concentrating 0 0 0 0 0  Moving slowly or fidgety/restless 0 0 0 0 0  Suicidal thoughts 0 0 0 0 0  PHQ-9 Score 0 6 0 0 0  Difficult doing work/chores  Somewhat difficult       phq 9 is negative   Fall Risk:    03/21/2023   10:22 AM 12/20/2022    9:39 AM 08/29/2022   11:35 AM 06/19/2022    9:57 AM 04/06/2022    3:41 PM  Fall Risk   Falls in the past year? 1 1 0 0 1  Number falls in past yr: 0 0  0 0  Injury with Fall? 0 0  0 0  Risk for fall due to : History of fall(s) History of fall(s) No Fall Risks No Fall Risks No Fall Risks  Follow up Falls prevention  discussed;Education provided;Falls evaluation completed Falls prevention discussed;Education provided;Falls evaluation completed Falls prevention discussed;Education provided;Falls evaluation completed Falls prevention discussed Falls prevention discussed      Functional Status Survey: Is the patient deaf or have difficulty hearing?: No Does the patient have difficulty seeing, even when wearing glasses/contacts?: No Does the patient have difficulty concentrating, remembering, or making decisions?: No Does the patient have difficulty walking or climbing stairs?: Yes Does the patient have difficulty dressing or bathing?: No Does the patient have difficulty doing errands alone such as visiting a doctor's office or shopping?: No    Assessment & Plan  1. Atherosclerosis of aorta (HCC)  - atorvastatin (LIPITOR) 20 MG tablet; Take 1 tablet (20 mg total) by mouth daily.  Dispense: 90 tablet; Refill: 1  2. Morbid obesity (HCC)  Discussed with the patient the risk posed by an increased BMI. Discussed importance of portion control, calorie counting and at least 150 minutes of physical activity weekly. Avoid sweet beverages and drink more water. Eat at least 6 servings of fruit and vegetables daily    3. Age-related osteoporosis without current pathological fracture  On Prolia   4. GERD without esophagitis  - pantoprazole (PROTONIX) 40 MG tablet; Take 1 tablet (40 mg total) by mouth daily.  Dispense: 90 tablet; Refill: 1  5. Essential hypertension  - amLODipine-valsartan (EXFORGE) 5-160 MG tablet; Take 1 tablet by mouth daily.  Dispense: 90 tablet; Refill: 1 - metoprolol succinate (TOPROL-XL) 50 MG 24 hr tablet; Take 1 tablet (50 mg total) by mouth daily. Take with or immediately following a meal.  Dispense: 90 tablet; Refill: 1  6. Pre-diabetes  Discussed diet   7. Dysthymia  - buPROPion (WELLBUTRIN XL) 150 MG 24 hr tablet; Take 1 tablet (150 mg total) by mouth daily.  Dispense: 90  tablet; Refill: 0 - sertraline (ZOLOFT) 100 MG tablet; Take 1 tablet (100 mg total) by mouth every morning.  Dispense: 90 tablet; Refill: 0  8. DDD (degenerative disc disease), lumbar  - Ambulatory referral to Pain Clinic  9. Ischemic demyelination of brain  - atorvastatin (LIPITOR) 20 MG tablet; Take 1 tablet (20 mg total) by  mouth daily.  Dispense: 90 tablet; Refill: 1  10. Primary osteoarthritis of both knees  - meloxicam (MOBIC) 15 MG tablet; Take 1 tablet (15 mg total) by mouth daily.  Dispense: 90 tablet; Refill: 1

## 2023-03-21 ENCOUNTER — Encounter: Payer: Self-pay | Admitting: Family Medicine

## 2023-03-21 ENCOUNTER — Ambulatory Visit (INDEPENDENT_AMBULATORY_CARE_PROVIDER_SITE_OTHER): Payer: Medicare Other | Admitting: Family Medicine

## 2023-03-21 VITALS — BP 136/84 | HR 84 | Temp 97.8°F | Resp 16 | Ht 63.0 in | Wt 238.0 lb

## 2023-03-21 DIAGNOSIS — I700 Atherosclerosis of aorta: Secondary | ICD-10-CM

## 2023-03-21 DIAGNOSIS — M5136 Other intervertebral disc degeneration, lumbar region: Secondary | ICD-10-CM | POA: Diagnosis not present

## 2023-03-21 DIAGNOSIS — I10 Essential (primary) hypertension: Secondary | ICD-10-CM

## 2023-03-21 DIAGNOSIS — F341 Dysthymic disorder: Secondary | ICD-10-CM | POA: Diagnosis not present

## 2023-03-21 DIAGNOSIS — K219 Gastro-esophageal reflux disease without esophagitis: Secondary | ICD-10-CM | POA: Diagnosis not present

## 2023-03-21 DIAGNOSIS — R7303 Prediabetes: Secondary | ICD-10-CM

## 2023-03-21 DIAGNOSIS — M170 Bilateral primary osteoarthritis of knee: Secondary | ICD-10-CM | POA: Diagnosis not present

## 2023-03-21 DIAGNOSIS — M810 Age-related osteoporosis without current pathological fracture: Secondary | ICD-10-CM

## 2023-03-21 DIAGNOSIS — I6782 Cerebral ischemia: Secondary | ICD-10-CM

## 2023-03-21 DIAGNOSIS — G3789 Other specified demyelinating diseases of central nervous system: Secondary | ICD-10-CM

## 2023-03-21 MED ORDER — ATORVASTATIN CALCIUM 20 MG PO TABS: 20.0000 mg | ORAL_TABLET | Freq: Every day | ORAL | 1 refills | Status: DC

## 2023-03-21 MED ORDER — BUPROPION HCL ER (XL) 150 MG PO TB24: 150.0000 mg | ORAL_TABLET | Freq: Every day | ORAL | 0 refills | Status: DC

## 2023-03-21 MED ORDER — PANTOPRAZOLE SODIUM 40 MG PO TBEC: 40.0000 mg | DELAYED_RELEASE_TABLET | Freq: Every day | ORAL | 1 refills | Status: DC

## 2023-03-21 MED ORDER — MELOXICAM 15 MG PO TABS: 15.0000 mg | ORAL_TABLET | Freq: Every day | ORAL | 1 refills | Status: DC

## 2023-03-21 MED ORDER — METOPROLOL SUCCINATE ER 50 MG PO TB24: 50.0000 mg | ORAL_TABLET | Freq: Every day | ORAL | 1 refills | Status: DC

## 2023-03-21 MED ORDER — AMLODIPINE BESYLATE-VALSARTAN 5-160 MG PO TABS: 1.0000 | ORAL_TABLET | Freq: Every day | ORAL | 1 refills | Status: DC

## 2023-03-21 MED ORDER — SERTRALINE HCL 100 MG PO TABS: 100.0000 mg | ORAL_TABLET | Freq: Every morning | ORAL | 0 refills | Status: DC

## 2023-04-02 DIAGNOSIS — M5126 Other intervertebral disc displacement, lumbar region: Secondary | ICD-10-CM | POA: Diagnosis not present

## 2023-04-02 DIAGNOSIS — M5416 Radiculopathy, lumbar region: Secondary | ICD-10-CM | POA: Diagnosis not present

## 2023-04-15 ENCOUNTER — Other Ambulatory Visit: Payer: Self-pay | Admitting: Family Medicine

## 2023-04-15 DIAGNOSIS — L304 Erythema intertrigo: Secondary | ICD-10-CM

## 2023-04-16 DIAGNOSIS — M5416 Radiculopathy, lumbar region: Secondary | ICD-10-CM | POA: Diagnosis not present

## 2023-04-20 DIAGNOSIS — M5416 Radiculopathy, lumbar region: Secondary | ICD-10-CM | POA: Diagnosis not present

## 2023-05-08 ENCOUNTER — Ambulatory Visit
Admission: RE | Admit: 2023-05-08 | Discharge: 2023-05-08 | Disposition: A | Discharge status: Home / Self Care | Payer: Medicare Other | Source: Ambulatory Visit | Attending: Oncology | Admitting: Oncology

## 2023-05-08 DIAGNOSIS — Z780 Asymptomatic menopausal state: Secondary | ICD-10-CM | POA: Diagnosis not present

## 2023-05-08 DIAGNOSIS — C50919 Malignant neoplasm of unspecified site of unspecified female breast: Secondary | ICD-10-CM | POA: Diagnosis not present

## 2023-05-08 DIAGNOSIS — Z79811 Long term (current) use of aromatase inhibitors: Secondary | ICD-10-CM | POA: Diagnosis not present

## 2023-05-08 DIAGNOSIS — M8589 Other specified disorders of bone density and structure, multiple sites: Secondary | ICD-10-CM | POA: Diagnosis not present

## 2023-05-10 ENCOUNTER — Other Ambulatory Visit: Payer: Self-pay

## 2023-05-10 DIAGNOSIS — N3946 Mixed incontinence: Secondary | ICD-10-CM

## 2023-05-10 DIAGNOSIS — R351 Nocturia: Secondary | ICD-10-CM

## 2023-05-10 MED ORDER — TOLTERODINE TARTRATE ER 4 MG PO CP24
4.0000 mg | ORAL_CAPSULE | Freq: Every day | ORAL | 0 refills | Status: DC
Start: 2023-05-10 — End: 2023-05-14

## 2023-05-14 ENCOUNTER — Ambulatory Visit: Payer: Medicare Other | Admitting: Urology

## 2023-05-14 VITALS — BP 130/80 | HR 74 | Ht 63.0 in | Wt 233.0 lb

## 2023-05-14 DIAGNOSIS — R351 Nocturia: Secondary | ICD-10-CM

## 2023-05-14 DIAGNOSIS — N3946 Mixed incontinence: Secondary | ICD-10-CM | POA: Diagnosis not present

## 2023-05-14 LAB — URINALYSIS, COMPLETE
Bilirubin, UA: NEGATIVE
Glucose, UA: NEGATIVE
Ketones, UA: NEGATIVE
Nitrite, UA: NEGATIVE
Protein,UA: NEGATIVE
RBC, UA: NEGATIVE
Specific Gravity, UA: 1.02 (ref 1.005–1.030)
Urobilinogen, Ur: 1 mg/dL (ref 0.2–1.0)
pH, UA: 6 (ref 5.0–7.5)

## 2023-05-14 LAB — MICROSCOPIC EXAMINATION: Epithelial Cells (non renal): >10 /hpf — AB (ref 0–10)

## 2023-05-14 MED ORDER — GEMTESA 75 MG PO TABS: 1.0000 | ORAL_TABLET | Freq: Every day | ORAL | 11 refills | Status: DC

## 2023-05-14 MED ORDER — TOLTERODINE TARTRATE ER 4 MG PO CP24
4.0000 mg | ORAL_CAPSULE | Freq: Every day | ORAL | 3 refills | Status: DC
Start: 2023-05-14 — End: 2024-06-26

## 2023-05-14 NOTE — Addendum Note (Signed)
Addended by: Consuella Lose on: 05/14/2023 11:47 AM   Modules accepted: Orders

## 2023-05-14 NOTE — Progress Notes (Signed)
05/14/2023 11:09 AM   Derwood Kaplan Gomer 07-Jun-1946 [id number]  Referring provider: Alba Cory, MD [address] [place],  [place] [address]  Chief Complaint  Patient presents with   Urinary Incontinence    HPI: I was consulted by the above provider to assist the patient's urge incontinence worsening over many months.  She has urge incontinence wearing 1 pad a day damp.  She denies stress incontinence bedwetting.  She voids every 2-3 hours and gets up 3-4 times a night    The patient had mild descensus at rest with grade 2 hypermobility the bladder neck and minimal movement.  No stress incontinence.  Small high grade 1 cystocele.  She had a suburethral swelling nontender and she likely does not have a diverticulum    Patient has mild urge incontinence and moderate nighttime frequency  I will reassess the patient on Myrbetriq.  Desmopressin is a future option.  Assessed on Myrbetriq in about 6 weeks.  She said her primary most bothersome symptom is the nighttime frequency.  Percutaneous tibial nerve stimulation is another option   Frequency and urgency improved during the day but still gets up at night similar amount. The patient was given oxybutynin ER 10 mg and Detrol LA 4 mg and reassess in 8 weeks.  If she does not reach her goal I will discussed desmopressin recognizing potential cost issues and percutaneous tibial nerve stimulation for milder OAB symptoms and nighttime frequency   Frequency and urge incontinence and nocturia all better on tolterodine.  Oxybutynin did not help.  Gets up once a night depending on fluid intake.       Tolterodine not working as well and now she is getting up more at night which is affecting her quality life.    Due to insurance I did not offer desmopressin.  I went through percutaneous tibial nerve stimulation with template.  Handout given.   TOday Patient is on monthly percutaneous tibial nerve stimulation and Detrol.  Frequency  stable.  Minimal key in the door syndrome with urge incontinence during the day.  Gets up once a night instead of 6.  She is very pleased       Today Urgency incontinence much better.  She stopped PTNS last month and does not see a major difference.  Wants to stay on Detrol.  No infections.  Frequency stable.  90 x 3 sent to pharmacy and I will see in 1 year  Today Frequency is stable.  Stays dry during the day.  Some mornings she has foot on the floor syndrome.  Clinically not infected   PMH: Past Medical History:  Diagnosis Date   Allergy    Arthritis    knees, lower back   Cancer [facility])    Breast cancer   Esophageal reflux    Hypertension 2006   Osteoporosis    Personal history of malignant neoplasm of breast 2011   right breast   Personal history of radiation therapy 2011   RIGHT lumpectomy w/ radiation    Surgical History: Past Surgical History:  Procedure Laterality Date   ABDOMINAL HYSTERECTOMY     BACK SURGERY     BREAST BIOPSY Right 2011   radiation   BREAST LUMPECTOMY Right 2011   w/ radiation   BREAST SURGERY Right 2011   right breast lumpectomy,L/SN/R for right breast CA    CATARACT EXTRACTION W/PHACO Left 06/10/2018   Procedure: CATARACT EXTRACTION PHACO AND INTRAOCULAR LENS PLACEMENT (IOC) LEFT;  Surgeon: Willey Blade  Loraine Leriche, MD;  Location:  [facility];  Service: Ophthalmology;  Laterality: Left;  Diabetic - oral meds   CATARACT EXTRACTION W/PHACO Right 07/15/2018   Procedure: CATARACT EXTRACTION PHACO AND INTRAOCULAR LENS PLACEMENT (IOC) RIGHT;  Surgeon: Nevada Crane, MD;  Location: [facility];  Service: Ophthalmology;  Laterality: Right;   COLONOSCOPY  2003   COLONOSCOPY N/A 03/09/2022   Procedure: COLONOSCOPY WITH BIOPSY;  Surgeon: Midge Minium, MD;  Location: [facility];  Service: Endoscopy;  Laterality: N/A;   POLYPECTOMY N/A 03/09/2022   Procedure: POLYPECTOMY;  Surgeon: Midge Minium, MD;  Location: [facility];   Service: Endoscopy;  Laterality: N/A;   PORT A CATH REVISION     REPLACEMENT TOTAL KNEE Right 10/03/2019   DR. Odis Luster    WRIST SURGERY  2005    Home Medications:  Allergies as of 05/14/2023       Reactions   Shrimp [shellfish Allergy] Hives   Augmentin [amoxicillin-pot Clavulanate] Nausea Only        Medication List        Accurate as of May 14, 2023 11:09 AM. If you have any questions, ask your nurse or doctor.          acetaminophen 650 MG CR tablet Commonly known as: TYLENOL Take 1,300 mg by mouth every 8 (eight) hours as needed for pain.   amLODipine-valsartan 5-160 MG tablet Commonly known as: EXFORGE Take 1 tablet by mouth daily.   atorvastatin 20 MG tablet Commonly known as: LIPITOR Take 1 tablet (20 mg total) by mouth daily.   buPROPion 150 MG 24 hr tablet Commonly known as: WELLBUTRIN XL Take 1 tablet (150 mg total) by mouth daily.   CALCIUM-MAGNESIUM PO Take 1 tablet by mouth daily.   cetirizine 10 MG tablet Commonly known as: ZYRTEC Take 10 mg by mouth daily.   denosumab 60 MG/ML Sosy injection Commonly known as: PROLIA Inject 60 mg into the skin every 6 (six) months.   meloxicam 15 MG tablet Commonly known as: MOBIC Take 1 tablet (15 mg total) by mouth daily.   metoprolol succinate 50 MG 24 hr tablet Commonly known as: TOPROL-XL Take 1 tablet (50 mg total) by mouth daily. Take with or immediately following a meal.   multivitamin tablet Take 1 tablet by mouth daily.   nystatin powder Commonly known as: MYCOSTATIN/NYSTOP APPLY  POWDER TOPICALLY TWICE DAILY   pantoprazole 40 MG tablet Commonly known as: PROTONIX Take 1 tablet (40 mg total) by mouth daily.   sertraline 100 MG tablet Commonly known as: ZOLOFT Take 1 tablet (100 mg total) by mouth every morning.   tolterodine 4 MG 24 hr capsule Commonly known as: DETROL LA Take 1 capsule (4 mg total) by mouth daily.   VITAMIN D-3 PO Take 1 tablet by mouth daily.         Allergies:  Allergies  Allergen Reactions   Shrimp [Shellfish Allergy] Hives   Augmentin [Amoxicillin-Pot Clavulanate] Nausea Only    Family History: Family History  Problem Relation Age of Onset   Cancer Father    Bone cancer Father    Breast cancer Mother 57   Stroke Mother    Colon cancer Maternal Grandmother    Breast cancer Cousin     Social History:  reports that she has never smoked. She has never been exposed to tobacco smoke. She has never used smokeless tobacco. She reports current alcohol use. She reports that she does not use drugs.  ROS:  Physical Exam: There were no vitals taken for this visit.  Constitutional:  Alert and oriented, No acute distress. HEENT: [place] AT, moist mucus membranes.  Trachea midline, no masses.   Laboratory Data: Lab Results  Component Value Date   WBC 7.4 12/28/2022   HGB 12.5 12/28/2022   HCT 37.4 12/28/2022   MCV 91.4 12/28/2022   PLT 215 12/28/2022    Lab Results  Component Value Date   CREATININE 0.70 12/28/2022    No results found for: "PSA"  No results found for: "TESTOSTERONE"  Lab Results  Component Value Date   HGBA1C 6.1 (H) 12/20/2022    Urinalysis    Component Value Date/Time   COLORURINE YELLOW (A) 12/04/2018 0906   APPEARANCEUR Clear 05/08/2022 1536   LABSPEC 1.009 12/04/2018 0906   LABSPEC 1.026 03/19/2012 2152   PHURINE 6.0 12/04/2018 0906   GLUCOSEU Negative 05/08/2022 1536   GLUCOSEU Negative 03/19/2012 2152   HGBUR NEGATIVE 12/04/2018 0906   BILIRUBINUR Negative 05/08/2022 1536   BILIRUBINUR Negative 03/19/2012 2152   KETONESUR NEGATIVE 12/04/2018 0906   PROTEINUR Negative 05/08/2022 1536   PROTEINUR NEGATIVE 12/04/2018 0906   NITRITE Negative 05/08/2022 1536   NITRITE NEGATIVE 12/04/2018 0906   LEUKOCYTESUR Negative 05/08/2022 1536   LEUKOCYTESUR Trace 03/19/2012 2152    Pertinent Imaging:   Assessment & Plan: Patient  understands that likely medication will not work better and overall she is done quite well staying dry during the day.  I do not think InterStim or Botox would be in her best interest at this stage.  She would like to try Gemtesa samples but otherwise stay on the Detrol and be seen in a year.  Samples given with a hand-delivered prescription of both.  I will see in 1 year the either on Detrol or Gemtesa  1. Mixed incontinence  - Urinalysis, Complete   No follow-ups on file.  Martina Sinner, MD  [facility] [address], [address] [place], [place] [address] [phone]

## 2023-06-06 DIAGNOSIS — M5126 Other intervertebral disc displacement, lumbar region: Secondary | ICD-10-CM | POA: Diagnosis not present

## 2023-06-06 DIAGNOSIS — M5416 Radiculopathy, lumbar region: Secondary | ICD-10-CM | POA: Diagnosis not present

## 2023-06-08 ENCOUNTER — Other Ambulatory Visit: Payer: Self-pay | Admitting: Neurosurgery

## 2023-06-08 DIAGNOSIS — M5416 Radiculopathy, lumbar region: Secondary | ICD-10-CM

## 2023-06-12 ENCOUNTER — Ambulatory Visit
Admission: RE | Admit: 2023-06-12 | Discharge: 2023-06-12 | Disposition: A | Discharge status: Home / Self Care | Payer: Medicare Other | Source: Ambulatory Visit | Attending: Neurosurgery | Admitting: Neurosurgery

## 2023-06-12 DIAGNOSIS — M47816 Spondylosis without myelopathy or radiculopathy, lumbar region: Secondary | ICD-10-CM | POA: Diagnosis not present

## 2023-06-12 DIAGNOSIS — M4316 Spondylolisthesis, lumbar region: Secondary | ICD-10-CM | POA: Diagnosis not present

## 2023-06-12 DIAGNOSIS — M5416 Radiculopathy, lumbar region: Secondary | ICD-10-CM | POA: Diagnosis not present

## 2023-06-12 DIAGNOSIS — M48061 Spinal stenosis, lumbar region without neurogenic claudication: Secondary | ICD-10-CM | POA: Diagnosis not present

## 2023-06-14 ENCOUNTER — Other Ambulatory Visit: Payer: Self-pay | Admitting: Family Medicine

## 2023-06-14 DIAGNOSIS — F341 Dysthymic disorder: Secondary | ICD-10-CM

## 2023-06-20 ENCOUNTER — Ambulatory Visit: Payer: Medicare Other | Admitting: Family Medicine

## 2023-06-21 ENCOUNTER — Ambulatory Visit (INDEPENDENT_AMBULATORY_CARE_PROVIDER_SITE_OTHER): Payer: Medicare Other

## 2023-06-21 VITALS — Ht 63.0 in | Wt 233.0 lb

## 2023-06-21 DIAGNOSIS — Z0000 Encounter for general adult medical examination without abnormal findings: Secondary | ICD-10-CM | POA: Diagnosis not present

## 2023-06-21 NOTE — Progress Notes (Signed)
Subjective:   Mary Todd is a 77 y.o. female who presents for Medicare Annual (Subsequent) preventive examination.  Visit Complete: Virtual  I connected with  [name] on 06/21/23 by a audio enabled telemedicine application and verified that I am speaking with the correct person using two identifiers.  Patient Location: Home  Provider Location: Office/Clinic  I discussed the limitations of evaluation and management by telemedicine. The patient expressed understanding and agreed to proceed.  Vital Signs: Unable to obtain new vitals due to this being a telehealth visit.  Patient Medicare AWV questionnaire was completed by the patient on (not done); I have confirmed that all information answered by patient is correct and no changes since this date.  Review of Systems    Cardiac Risk Factors include: advanced age (>82men, >23 women);diabetes mellitus;hypertension;dyslipidemia;obesity (BMI >30kg/m2)    Objective:    Today's Vitals   06/21/23 1526 06/21/23 1527  Weight: 233 lb (105.7 kg)   Height: 5\' 3"  (1.6 m)   PainSc:  6    Body mass index is 41.27 kg/m.     06/21/2023    3:38 PM 12/28/2022   10:11 AM 08/22/2022    4:18 PM 04/06/2022    3:40 PM 03/09/2022    8:00 AM 12/29/2021    1:25 PM 12/07/2021   11:01 AM  Advanced Directives  Does Patient Have a Medical Advance Directive? Yes Yes No No No No No  Type of Estate agent of [place];Living will Healthcare Power of [place];Living will       Does patient want to make changes to medical advance directive?    No - Patient declined     Copy of Healthcare Power of Attorney in Chart?  No - copy requested       Would patient like information on creating a medical advance directive?     No - Patient declined      Current Medications (verified) Outpatient Encounter Medications as of 06/21/2023  Medication Sig   acetaminophen (TYLENOL) 650 MG CR tablet Take 1,300 mg by mouth every 8  (eight) hours as needed for pain.   amLODipine-valsartan (EXFORGE) 5-160 MG tablet Take 1 tablet by mouth daily.   atorvastatin (LIPITOR) 20 MG tablet Take 1 tablet (20 mg total) by mouth daily.   buPROPion (WELLBUTRIN XL) 150 MG 24 hr tablet Take 1 tablet (150 mg total) by mouth daily.   CALCIUM-MAGNESIUM PO Take 1 tablet by mouth daily.   cetirizine (ZYRTEC) 10 MG tablet Take 10 mg by mouth daily.   Cholecalciferol (VITAMIN D-3 PO) Take 1 tablet by mouth daily.   denosumab (PROLIA) 60 MG/ML SOSY injection Inject 60 mg into the skin every 6 (six) months.   meloxicam (MOBIC) 15 MG tablet Take 1 tablet (15 mg total) by mouth daily.   metoprolol succinate (TOPROL-XL) 50 MG 24 hr tablet Take 1 tablet (50 mg total) by mouth daily. Take with or immediately following a meal.   Multiple Vitamin (MULTIVITAMIN) tablet Take 1 tablet by mouth daily.   nystatin (MYCOSTATIN/NYSTOP) powder APPLY  POWDER TOPICALLY TWICE DAILY   pantoprazole (PROTONIX) 40 MG tablet Take 1 tablet (40 mg total) by mouth daily.   sertraline (ZOLOFT) 100 MG tablet TAKE 1 TABLET BY MOUTH IN THE MORNING   Vibegron (GEMTESA) 75 MG TABS Take 1 tablet (75 mg total) by mouth daily.   tolterodine (DETROL LA) 4 MG 24 hr capsule Take 1 capsule (4 mg total) by mouth daily. (Patient not  taking: Reported on 06/21/2023)   No facility-administered encounter medications on file as of 06/21/2023.    Allergies (verified) Shrimp [shellfish allergy] and Augmentin [amoxicillin-pot clavulanate]   History: Past Medical History:  Diagnosis Date   Allergy    Arthritis    knees, lower back   Cancer [facility])    Breast cancer   Esophageal reflux    Hypertension 2006   Osteoporosis    Personal history of malignant neoplasm of breast 2011   right breast   Personal history of radiation therapy 2011   RIGHT lumpectomy w/ radiation   Past Surgical History:  Procedure Laterality Date   ABDOMINAL HYSTERECTOMY     BACK SURGERY     BREAST BIOPSY  Right 2011   radiation   BREAST LUMPECTOMY Right 2011   w/ radiation   BREAST SURGERY Right 2011   right breast lumpectomy,L/SN/R for right breast CA    CATARACT EXTRACTION W/PHACO Left 06/10/2018   Procedure: CATARACT EXTRACTION PHACO AND INTRAOCULAR LENS PLACEMENT (IOC) LEFT;  Surgeon: Nevada Crane, MD;  Location: [facility];  Service: Ophthalmology;  Laterality: Left;  Diabetic - oral meds   CATARACT EXTRACTION W/PHACO Right 07/15/2018   Procedure: CATARACT EXTRACTION PHACO AND INTRAOCULAR LENS PLACEMENT (IOC) RIGHT;  Surgeon: Nevada Crane, MD;  Location: [facility];  Service: Ophthalmology;  Laterality: Right;   COLONOSCOPY  2003   COLONOSCOPY N/A 03/09/2022   Procedure: COLONOSCOPY WITH BIOPSY;  Surgeon: Midge Minium, MD;  Location: [facility];  Service: Endoscopy;  Laterality: N/A;   POLYPECTOMY N/A 03/09/2022   Procedure: POLYPECTOMY;  Surgeon: Midge Minium, MD;  Location: [facility];  Service: Endoscopy;  Laterality: N/A;   PORT A CATH REVISION     REPLACEMENT TOTAL KNEE Right 10/03/2019   DR. Odis Luster    WRIST SURGERY  2005   Family History  Problem Relation Age of Onset   Cancer Father    Bone cancer Father    Breast cancer Mother 16   Stroke Mother    Colon cancer Maternal Grandmother    Breast cancer Cousin    Social History   Socioeconomic History   Marital status: Married    Spouse name: [name]   Number of children: 2   Years of education: some college   Highest education level: 12th grade  Occupational History   Occupation: Retired  Tobacco Use   Smoking status: Never    Passive exposure: Never   Smokeless tobacco: Never   Tobacco comments:    smoking cessation materials not required  Vaping Use   Vaping status: Never Used  Substance and Sexual Activity   Alcohol use: Yes    Comment: occasionally   Drug use: No   Sexual activity: Not Currently  Other Topics Concern   Not on file  Social History Narrative    Not on file   Social Determinants of Health   Financial Resource Strain: Low Risk  (06/21/2023)   Overall Financial Resource Strain (CARDIA)    Difficulty of Paying Living Expenses: Not hard at all  Food Insecurity: No Food Insecurity (06/21/2023)   Hunger Vital Sign    Worried About Running Out of Food in the Last Year: Never true    Ran Out of Food in the Last Year: Never true  Transportation Needs: No Transportation Needs (06/21/2023)   PRAPARE - Administrator, Civil Service (Medical): No    Lack of Transportation (Non-Medical): No  Physical Activity: Insufficiently Active (06/21/2023)  Exercise Vital Sign    Days of Exercise per Week: 3 days    Minutes of Exercise per Session: 30 min  Stress: No Stress Concern Present (06/21/2023)   [employer] of Occupational Health - Occupational Stress Questionnaire    Feeling of Stress : Only a little  Social Connections: Moderately Integrated (06/21/2023)   Social Connection and Isolation Panel [NHANES]    Frequency of Communication with Friends and Family: More than three times a week    Frequency of Social Gatherings with Friends and Family: Three times a week    Attends Religious Services: More than 4 times per year    Active Member of Clubs or Organizations: No    Attends Banker Meetings: Never    Marital Status: Married    Tobacco Counseling Counseling given: Not Answered Tobacco comments: smoking cessation materials not required   Clinical Intake:  Pre-visit preparation completed: Yes  Pain : 0-10 Pain Score: 6  Pain Type: Chronic pain Pain Location: Back (left knee -4) Pain Descriptors / Indicators: Aching Pain Onset: More than a month ago Pain Frequency: Intermittent Pain Relieving Factors: tylenol arthritis, Meloxicam  Pain Relieving Factors: tylenol arthritis, Meloxicam  BMI - recorded: 41.27 Nutritional Status: BMI > 30  Obese Nutritional Risks: None Diabetes: No  How often do  you need to have someone help you when you read instructions, pamphlets, or other written materials from your doctor or pharmacy?: 1 - Never  Interpreter Needed?: No  Comments: lives with others Information entered by :: B.Chez Bulnes,LPN   Activities of Daily Living    06/21/2023    3:38 PM 03/21/2023   10:22 AM  In your present state of health, do you have any difficulty performing the following activities:  Hearing? 0 0  Vision? 0 0  Difficulty concentrating or making decisions? 0 0  Walking or climbing stairs? 1 1  Dressing or bathing? 0 0  Doing errands, shopping? 0 0  Preparing Food and eating ? N   Using the Toilet? N   In the past six months, have you accidently leaked urine? N   Do you have problems with loss of bowel control? N   Managing your Medications? N   Managing your Finances? N   Housekeeping or managing your Housekeeping? N     Patient Care Team: Alba Cory, MD as PCP - General (Family Medicine) Solum, Marlana Salvage, MD as Consulting Physician (Endocrinology) Hulan Fray as Physician Assistant (Urology) Felecia Shelling, DPM as Consulting Physician (Podiatry) Lyndle Herrlich, MD as Consulting Physician (Orthopedic Surgery) Rickard Patience, MD as Consulting Physician (Oncology) Debbe Odea, MD as Consulting Physician (Cardiology)  Indicate any recent Medical Services you may have received from other than [facility] providers in the past year (date may be approximate).     Assessment:   This is a routine wellness examination for Triena.  Hearing/Vision screen Hearing Screening - Comments:: Adequate hearing Vision Screening - Comments:: Adequate vision after cataracts removed;readers only Dr Brooke Dare  Dietary issues and exercise activities discussed:     Goals Addressed             This Visit's Progress    DIET - INCREASE WATER INTAKE   On track    Recommend to drink at least 6-8 8oz glasses of water per day.     Increase physical activity   On  track    Pt would like to start water aerobics again twice weekly and increase physical activity overall  Depression Screen    06/21/2023    3:35 PM 03/21/2023   10:22 AM 12/20/2022    9:39 AM 08/29/2022   11:35 AM 06/19/2022    9:57 AM 04/06/2022    3:39 PM 02/15/2022    9:42 AM  PHQ 2/9 Scores  PHQ - 2 Score 0 0 4 0 0 0 0  PHQ- 9 Score  0 6 0 0 0 0    Fall Risk    06/21/2023    3:31 PM 06/21/2023    3:30 PM 03/21/2023   10:22 AM 12/20/2022    9:39 AM 08/29/2022   11:35 AM  Fall Risk   Falls in the past year? 0  1 1 0  Number falls in past yr: 0  0 0   Injury with Fall? 0  0 0   Risk for fall due to : No Fall Risks No Fall Risks History of fall(s) History of fall(s) No Fall Risks  Follow up Education provided;Falls prevention discussed Education provided;Falls prevention discussed Falls prevention discussed;Education provided;Falls evaluation completed Falls prevention discussed;Education provided;Falls evaluation completed Falls prevention discussed;Education provided;Falls evaluation completed    MEDICARE RISK AT HOME: Medicare Risk at Home Any stairs in or around the home?: Yes If so, are there any without handrails?: Yes Home free of loose throw rugs in walkways, pet beds, electrical cords, etc?: Yes Adequate lighting in your home to reduce risk of falls?: Yes Life alert?: No Use of a cane, walker or w/c?: No Grab bars in the bathroom?: No Shower chair or bench in shower?: No Elevated toilet seat or a handicapped toilet?: No  TIMED UP AND GO:  Was the test performed?  No    Cognitive Function:        06/21/2023    3:42 PM 01/01/2020   10:17 AM 12/27/2018   10:07 AM 12/25/2017   11:06 AM  6CIT Screen  What Year? 0 points 0 points 0 points 0 points  What month? 0 points 0 points 0 points 0 points  What time? 0 points 0 points 0 points 0 points  Count back from 20 0 points 0 points 0 points 0 points  Months in reverse 0 points 0 points 2 points 0 points   Repeat phrase 0 points 0 points 0 points 0 points  Total Score 0 points 0 points 2 points 0 points    Immunizations Immunization History  Administered Date(s) Administered   Fluad Quad(high Dose 65+) 08/11/2019, 07/06/2020, 07/08/2021, 08/07/2022   Influenza, High Dose Seasonal PF 09/20/2015, 07/17/2016, 07/03/2017, 07/03/2018   Influenza-Unspecified 07/30/2013   Moderna Sars-Covid-2 Vaccination 12/05/2019, 01/02/2020, 08/31/2020, 03/24/2021   Pfizer Covid-19 Vaccine Bivalent Booster 80yrs & up 08/08/2021   Pneumococcal Conjugate-13 05/04/2016   Pneumococcal Polysaccharide-23 08/27/2007, 06/17/2013   RSV,unspecified 11/15/2022   Tdap 09/16/2010, 02/15/2022   Unspecified SARS-COV-2 Vaccination 11/15/2022   Zoster Recombinant(Shingrix) 05/30/2018, 07/29/2018   Zoster, Live 02/28/2011    TDAP status: Up to date  Flu Vaccine status: Up to date  Pneumococcal vaccine status: Up to date  Covid-19 vaccine status: Completed vaccines  Qualifies for Shingles Vaccine? Yes   Zostavax completed Yes   Shingrix Completed?: Yes  Screening Tests Health Maintenance  Topic Date Due   COVID-19 Vaccine (7 - 2023-24 season) 01/10/2023   INFLUENZA VACCINE  07/03/2023 (Originally 05/31/2023)   MAMMOGRAM  07/29/2023   Medicare Annual Wellness (AWV)  06/20/2024   DTaP/Tdap/Td (3 - Td or Tdap) 02/16/2032   Pneumonia Vaccine 1+ Years old  Completed  DEXA SCAN  Completed   Hepatitis C Screening  Completed   Zoster Vaccines- Shingrix  Completed   HPV VACCINES  Aged Out   Colonoscopy  Discontinued    Health Maintenance  Health Maintenance Due  Topic Date Due   COVID-19 Vaccine (7 - 2023-24 season) 01/10/2023    Colorectal cancer screening: No longer required.   Mammogram status: No longer required due to age.  Bone Density status: Completed yes. Results reflect: Bone density results: OSTEOPOROSIS. Repeat every 5/3-5 years.  Lung Cancer Screening: (Low Dose CT Chest recommended if Age  22-80 years, 20 pack-year currently smoking OR have quit w/in 15years.) does not qualify.   Lung Cancer Screening Referral: no  Additional Screening:  Hepatitis C Screening: does not qualify; Completed yes  Vision Screening: Recommended annual ophthalmology exams for early detection of glaucoma and other disorders of the eye. Is the patient up to date with their annual eye exam?  Yes  Who is the provider or what is the name of the office in which the patient attends annual eye exams? Dr Brooke Dare If pt is not established with a provider, would they like to be referred to a provider to establish care? No .   Dental Screening: Recommended annual dental exams for proper oral hygiene  Diabetic Foot Exam: n/a  Community Resource Referral / Chronic Care Management: CRR required this visit?  No   CCM required this visit?  No     Plan:     I have personally reviewed and noted the following in the patient's chart:   Medical and social history Use of alcohol, tobacco or illicit drugs  Current medications and supplements including opioid prescriptions. Patient is not currently taking opioid prescriptions. Functional ability and status Nutritional status Physical activity Advanced directives List of other physicians Hospitalizations, surgeries, and ER visits in previous 12 months Vitals Screenings to include cognitive, depression, and falls Referrals and appointments  In addition, I have reviewed and discussed with patient certain preventive protocols, quality metrics, and best practice recommendations. A written personalized care plan for preventive services as well as general preventive health recommendations were provided to patient.     Sue Lush, LPN   [id number]   After Visit Summary: (MyChart) Due to this being a telephonic visit, the after visit summary with patients personalized plan was offered to patient via MyChart   Nurse Notes: Pt states she is doing alright. She  expresses concern of having lots of gas and rectal spasms. She has an appt with PCP tomorrow and would like to discuss this as well as he weight.

## 2023-06-21 NOTE — Patient Instructions (Signed)
Mary Todd , Thank you for taking time to come for your Medicare Wellness Visit. I appreciate your ongoing commitment to your health goals. Please review the following plan we discussed and let me know if I can assist you in the future.   Referrals/Orders/Follow-Ups/Clinician Recommendations: none  This is a list of the screening recommended for you and due dates:  Health Maintenance  Topic Date Due   COVID-19 Vaccine (7 - 2023-24 season) 01/10/2023   Flu Shot  07/03/2023*   Mammogram  07/29/2023   Medicare Annual Wellness Visit  06/20/2024   DTaP/Tdap/Td vaccine (3 - Td or Tdap) 02/16/2032   Pneumonia Vaccine  Completed   DEXA scan (bone density measurement)  Completed   Hepatitis C Screening  Completed   Zoster (Shingles) Vaccine  Completed   HPV Vaccine  Aged Out   Colon Cancer Screening  Discontinued  *Topic was postponed. The date shown is not the original due date.    Advanced directives: (Copy Requested) Please bring a copy of your health care power of attorney and living will to the office to be added to your chart at your convenience.  Next Medicare Annual Wellness Visit scheduled for next year: Yes 06/26/2024 @ 3:30pm telephone

## 2023-06-21 NOTE — Progress Notes (Signed)
Name: Mary Todd   MRN: [id number]    DOB: [birth year]   Date:06/22/2023       Progress Note  Subjective  Chief Complaint  Follow up  HPI  Pre-diabetes: she denies polyphagia, polydipsia or polyuria, last A1C was  up from 5.8 %  to 6.1 % today is 6.2 % . She no longer takes Metformin.   Syncopal Episode: two episodes in 2022, seen by cardiologist and work up negative - Dr. Myriam Forehand thought likely vasovagal episode. She also saw Dr. Sherryll Burger - neurologist and worked up essentially normal , she had a sleep study , per [facility] chart mild OSA and may consider auto Pap 5-20 cm H2O but she never started therapy . No other episodes, she has an upcoming visit with Dr. Sherryll Burger next month   DDD cervical spine/Thoracic back pain/lumbar pain  she is under the care of pain clinic in [place], history of low back surgery, current pain is worse on lumbar spine and recently went for repeat MRI but results not back yet. Pain is constant, nagging, worse when standing  and walking ( has to take breaks to seat down ) and affecting her qualify of life. Avoiding going shopping and doing things she likes. Discussed trying lyrica and she is wiling to try it , she will start at night and titrate as tolerated , she will contact me if she needs to go up on the dose   IMPRESSION: c-spine done 04/2022  Chronic degenerative spondylosis from C3-4 through C7-T1, not  visibly worsened since 2017. No compressive canal stenosis.  Foraminal narrowing that could possibly be symptomatic on both sides  at the C5-6 level.   Worsening of disc degeneration at T1-2 with loss of disc height and  a shallow disc herniation more prominent in the left posterolateral  direction with proximal foraminal encroachment. This level was not  studied in the axial plane. Discogenic edema of the endplates could  relate to regional pain. There is probably proximal foraminal  encroachment on the left that could possibly affect the left T1  nerve.    Urge incontinence: . She is under the care of Urologist, taking Detrol and seems to be improving symptoms, she is also having post tibial neuro stimulation needle electrode therapy monthly - but stopped therapy when reached the desired outcome. She states having to wear pads at night now   HTN: she is taking  Exforge 5/160 and also metoprolol, tolerating medication well, no chest pain , dizziness SOB or palpitation. BP is at goal    GERD: She is back on PPI daily , unable to tolerate skipping doses . Unchanged    OA: she had TKR right knee on Dec 4 th 2020 by Dr. Odis Luster . She needs to take Meloxicam daily otherwise she feels very stiff, discussed importance of trying to take 7.5 mg but she has not tried yet Explained again importance of regular physical activity , she has also noticed some instability on left knee, she does not want to go back to ortho at this time   History Right Breast Cancer/hodgkins lymphoma : under the care of Dr. Cathie Hoops  she has been released from her surgeon. Also diagnosed with hodgkin's lymphoma in 2004, breast cancer diagnosed in 2011  she stopped Arimidex Feb 2021. Last CA 27.29 stable in Feb 2024  Atherosclerosis aorta/Small vessel disease  : on recent CT abdomen, last LDLwas at goal down from 100 to 55 on current statin therapy  . MRI brain  showed white matter ischemic brain disease ( small vessel disease ) .Continue medications to control BP and LDL below 70   Fatty liver and hepatomegaly: seen by GI and was advised to try losing weight. Weight is trending up   Osteoporosis: diagnosed in 2018 with AP spine score of -3.1 . She gets Prolia at the cancer center and she is tolerating medications well. Compliant, last bone density was done in July 2024 and showed improvement of bone mass of spine - however it could be false reading due to DDD spine    Thyroid nodules: incidental finding on CT neck , had biopsy by  Dr. Tedd Sias Dec 2017  and biopsy was negative, she was last  seen 05/2020 and has been released from her care. Unchanged    Morbid obesity: BMI is over 40 now, she joined [employer] in June 2023 she states it did not help her lose weight, insurance does not cover weight loss medication, pain causes inability to be physically active . She is willing to pay cash for contrave.   Dysthymia: she took Citalopram for years and weaned self off in 2022. She tried Wellbutrin and it improved her fatigue but we stopped due to syncopal episode and possibility of seizures, but she has be cleared by Dr. Sherryll Burger and is back on medication. She is now on zoloft 100 mg and Wellbutrin and seems to be working well for her , since she wants to pay cash for Contrave we will stop wellbutrin again, since there is bupropion on the formulation    Patient Active Problem List   Diagnosis Date Noted   Polyp of transverse colon    Total knee replacement status, right 10/03/2019   Anterolisthesis 08/06/2017   DDD (degenerative disc disease), lumbosacral 08/06/2017   Chondromalacia patellae 07/03/2017   Osteoporosis 12/14/2016   Morbid obesity (HCC) 07/17/2016   Prolapsed cervical intervertebral disc 06/06/2016   Spinal stenosis in cervical region 05/22/2016   Thyroid nodule 05/22/2016   GERD without esophagitis 03/16/2016   Neck muscle spasm 03/16/2016   History of back surgery 03/16/2016   Osteoarthritis of both knees 03/16/2016   Large breasts 03/16/2016   Hypertension, benign 03/16/2016   Perennial allergic rhinitis with seasonal variation 03/16/2016   History of pneumococcal pneumonia 09/20/2015   History of Hodgkin's lymphoma 07/15/2014   Breast cancer, right (HCC) 06/26/2013    Past Surgical History:  Procedure Laterality Date   ABDOMINAL HYSTERECTOMY     BACK SURGERY     BREAST BIOPSY Right 2011   radiation   BREAST LUMPECTOMY Right 2011   w/ radiation   BREAST SURGERY Right 2011   right breast lumpectomy,L/SN/R for right breast CA    CATARACT EXTRACTION  W/PHACO Left 06/10/2018   Procedure: CATARACT EXTRACTION PHACO AND INTRAOCULAR LENS PLACEMENT (IOC) LEFT;  Surgeon: Nevada Crane, MD;  Location: [facility];  Service: Ophthalmology;  Laterality: Left;  Diabetic - oral meds   CATARACT EXTRACTION W/PHACO Right 07/15/2018   Procedure: CATARACT EXTRACTION PHACO AND INTRAOCULAR LENS PLACEMENT (IOC) RIGHT;  Surgeon: Nevada Crane, MD;  Location: [facility];  Service: Ophthalmology;  Laterality: Right;   COLONOSCOPY  2003   COLONOSCOPY N/A 03/09/2022   Procedure: COLONOSCOPY WITH BIOPSY;  Surgeon: Midge Minium, MD;  Location: [facility];  Service: Endoscopy;  Laterality: N/A;   POLYPECTOMY N/A 03/09/2022   Procedure: POLYPECTOMY;  Surgeon: Midge Minium, MD;  Location: [facility];  Service: Endoscopy;  Laterality: N/A;   PORT  A CATH REVISION     REPLACEMENT TOTAL KNEE Right 10/03/2019   DR. Odis Luster    WRIST SURGERY  2005    Family History  Problem Relation Age of Onset   Cancer Father    Bone cancer Father    Breast cancer Mother 60   Stroke Mother    Colon cancer Maternal Grandmother    Breast cancer Cousin     Social History   Tobacco Use   Smoking status: Never    Passive exposure: Never   Smokeless tobacco: Never   Tobacco comments:    smoking cessation materials not required  Substance Use Topics   Alcohol use: Yes    Comment: occasionally     Current Outpatient Medications:    acetaminophen (TYLENOL) 650 MG CR tablet, Take 1,300 mg by mouth every 8 (eight) hours as needed for pain., Disp: , Rfl:    amLODipine-valsartan (EXFORGE) 5-160 MG tablet, Take 1 tablet by mouth daily., Disp: 90 tablet, Rfl: 1   atorvastatin (LIPITOR) 20 MG tablet, Take 1 tablet (20 mg total) by mouth daily., Disp: 90 tablet, Rfl: 1   CALCIUM-MAGNESIUM PO, Take 1 tablet by mouth daily., Disp: , Rfl:    cetirizine (ZYRTEC) 10 MG tablet, Take 10 mg by mouth daily., Disp: , Rfl:    Cholecalciferol (VITAMIN D-3  PO), Take 1 tablet by mouth daily., Disp: , Rfl:    denosumab (PROLIA) 60 MG/ML SOSY injection, Inject 60 mg into the skin every 6 (six) months., Disp: , Rfl:    meloxicam (MOBIC) 15 MG tablet, Take 1 tablet (15 mg total) by mouth daily., Disp: 90 tablet, Rfl: 1   metoprolol succinate (TOPROL-XL) 50 MG 24 hr tablet, Take 1 tablet (50 mg total) by mouth daily. Take with or immediately following a meal., Disp: 90 tablet, Rfl: 1   Multiple Vitamin (MULTIVITAMIN) tablet, Take 1 tablet by mouth daily., Disp: , Rfl:    nystatin (MYCOSTATIN/NYSTOP) powder, APPLY  POWDER TOPICALLY TWICE DAILY, Disp: 60 g, Rfl: 0   pantoprazole (PROTONIX) 40 MG tablet, Take 1 tablet (40 mg total) by mouth daily., Disp: 90 tablet, Rfl: 1   pregabalin (LYRICA) 50 MG capsule, Take 1 capsule (50 mg total) by mouth 3 (three) times daily. Start at night and go up to three times daily as tolerated, Disp: 90 capsule, Rfl: 0   tolterodine (DETROL LA) 4 MG 24 hr capsule, Take 1 capsule (4 mg total) by mouth daily., Disp: 90 capsule, Rfl: 3   Vibegron (GEMTESA) 75 MG TABS, Take 1 tablet (75 mg total) by mouth daily., Disp: 30 tablet, Rfl: 11   Naltrexone-buPROPion HCl ER (CONTRAVE) 8-90 MG TB12, Start 1 tablet every morning for 7 days, then 1 tablet twice daily for 7 days, then 2 tablets every morning and one every evening, Disp: 120 tablet, Rfl: 0   sertraline (ZOLOFT) 100 MG tablet, Take 1 tablet (100 mg total) by mouth every morning., Disp: 90 tablet, Rfl: 0  Allergies  Allergen Reactions   Shrimp [Shellfish Allergy] Hives   Augmentin [Amoxicillin-Pot Clavulanate] Nausea Only    I personally reviewed active problem list, medication list, allergies, family history with the patient/caregiver today.   ROS  Ten systems reviewed and is negative except as mentioned in HPI    Objective  Vitals:   06/22/23 1008  BP: 130/76  Pulse: 86  Resp: 16  Temp: 98 F (36.7 C)  TempSrc: Oral  SpO2: 94%  Weight: 238 lb (108 kg)   Height:  5\' 3"  (1.6 m)    Body mass index is 42.16 kg/m.  Physical Exam Constitutional: Patient appears well-developed and well-nourished. Obese  No distress.  HEENT: head atraumatic, normocephalic, pupils equal and reactive to light, neck supple Cardiovascular: Normal rate, regular rhythm and normal heart sounds.  No murmur heard. No BLE edema. Pulmonary/Chest: Effort normal and breath sounds normal. No respiratory distress. Abdominal: Soft.  There is no tenderness. Muscular skeletal: no pain during palpation, negative straight leg raise  Psychiatric: Patient has a normal mood and affect. behavior is normal. Judgment and thought content normal.   Recent Results (from the past 2160 hour(s))  Urinalysis, Complete     Status: Abnormal   Collection Time: 05/14/23 11:14 AM  Result Value Ref Range   Specific Gravity, UA 1.020 1.005 - 1.030   pH, UA 6.0 5.0 - 7.5   Color, UA Yellow Yellow   Appearance Ur Clear Clear   Leukocytes,UA Trace (A) Negative   Protein,UA Negative Negative/Trace   Glucose, UA Negative Negative   Ketones, UA Negative Negative   RBC, UA Negative Negative   Bilirubin, UA Negative Negative   Urobilinogen, Ur 1.0 0.2 - 1.0 mg/dL   Nitrite, UA Negative Negative   Microscopic Examination See below:   Microscopic Examination     Status: Abnormal   Collection Time: 05/14/23 11:14 AM   Urine  Result Value Ref Range   WBC, UA 6-10 (A) 0 - 5 /hpf   RBC, Urine 0-2 0 - 2 /hpf   Epithelial Cells (non renal) >10 (A) 0 - 10 /hpf   Mucus, UA Present (A) Not Estab.   Bacteria, UA Few None seen/Few  POCT HgB A1C     Status: Abnormal   Collection Time: 06/22/23 10:20 AM  Result Value Ref Range   Hemoglobin A1C 6.2 (A) 4.0 - 5.6 %   HbA1c POC (<> result, manual entry)     HbA1c, POC (prediabetic range)     HbA1c, POC (controlled diabetic range)       PHQ2/9:    06/22/2023   10:08 AM 06/21/2023    3:35 PM 03/21/2023   10:22 AM 12/20/2022    9:39 AM 08/29/2022    11:35 AM  Depression screen PHQ 2/9  Decreased Interest 0 0 0 2 0  Down, Depressed, Hopeless 0 0 0 2 0  PHQ - 2 Score 0 0 0 4 0  Altered sleeping 0  0 0 0  Tired, decreased energy 0  0 1 0  Change in appetite 0  0 0 0  Feeling bad or failure about yourself  0  0 1 0  Trouble concentrating 0  0 0 0  Moving slowly or fidgety/restless 0  0 0 0  Suicidal thoughts 0  0 0 0  PHQ-9 Score 0  0 6 0  Difficult doing work/chores Not difficult at all   Somewhat difficult     phq 9 is negative   Fall Risk:    06/22/2023   10:07 AM 06/21/2023    3:31 PM 06/21/2023    3:30 PM 03/21/2023   10:22 AM 12/20/2022    9:39 AM  Fall Risk   Falls in the past year? 0 0  1 1  Number falls in past yr: 0 0  0 0  Injury with Fall? 0 0  0 0  Risk for fall due to : No Fall Risks No Fall Risks No Fall Risks History of fall(s) History of fall(s)  Follow up  Falls prevention discussed;Education provided;Falls evaluation completed Education provided;Falls prevention discussed Education provided;Falls prevention discussed Falls prevention discussed;Education provided;Falls evaluation completed Falls prevention discussed;Education provided;Falls evaluation completed     Assessment & Plan  1. Pre-diabetes  - POCT HgB A1C  2. Atherosclerosis of aorta (HCC)  On statin therapy   3. Morbid obesity (HCC)  - Naltrexone-buPROPion HCl ER (CONTRAVE) 8-90 MG TB12; Start 1 tablet every morning for 7 days, then 1 tablet twice daily for 7 days, then 2 tablets every morning and one every evening  Dispense: 120 tablet; Refill: 0  4. Age-related osteoporosis without current pathological fracture  On Prolia   5. Essential hypertension  BP is at goal   6. DDD (degenerative disc disease), lumbar  - pregabalin (LYRICA) 50 MG capsule; Take 1 capsule (50 mg total) by mouth 3 (three) times daily. Start at night and go up to three times daily as tolerated  Dispense: 90 capsule; Refill: 0  7. Right lumbar radiculitis  -  pregabalin (LYRICA) 50 MG capsule; Take 1 capsule (50 mg total) by mouth 3 (three) times daily. Start at night and go up to three times daily as tolerated  Dispense: 90 capsule; Refill: 0  8. Dysthymia  - sertraline (ZOLOFT) 100 MG tablet; Take 1 tablet (100 mg total) by mouth every morning.  Dispense: 90 tablet; Refill: 0  9. Disease of small blood vessels (HCC)  On statin therapy and bp medications

## 2023-06-22 ENCOUNTER — Ambulatory Visit (INDEPENDENT_AMBULATORY_CARE_PROVIDER_SITE_OTHER): Payer: Medicare Other | Admitting: Family Medicine

## 2023-06-22 ENCOUNTER — Encounter: Payer: Self-pay | Admitting: Family Medicine

## 2023-06-22 VITALS — BP 130/76 | HR 86 | Temp 98.0°F | Resp 16 | Ht 63.0 in | Wt 238.0 lb

## 2023-06-22 DIAGNOSIS — M5416 Radiculopathy, lumbar region: Secondary | ICD-10-CM | POA: Diagnosis not present

## 2023-06-22 DIAGNOSIS — F341 Dysthymic disorder: Secondary | ICD-10-CM | POA: Diagnosis not present

## 2023-06-22 DIAGNOSIS — I739 Peripheral vascular disease, unspecified: Secondary | ICD-10-CM

## 2023-06-22 DIAGNOSIS — I10 Essential (primary) hypertension: Secondary | ICD-10-CM

## 2023-06-22 DIAGNOSIS — R7303 Prediabetes: Secondary | ICD-10-CM

## 2023-06-22 DIAGNOSIS — I700 Atherosclerosis of aorta: Secondary | ICD-10-CM | POA: Diagnosis not present

## 2023-06-22 DIAGNOSIS — M5136 Other intervertebral disc degeneration, lumbar region: Secondary | ICD-10-CM

## 2023-06-22 DIAGNOSIS — M810 Age-related osteoporosis without current pathological fracture: Secondary | ICD-10-CM

## 2023-06-22 DIAGNOSIS — M51369 Other intervertebral disc degeneration, lumbar region without mention of lumbar back pain or lower extremity pain: Secondary | ICD-10-CM

## 2023-06-22 LAB — POCT GLYCOSYLATED HEMOGLOBIN (HGB A1C): Hemoglobin A1C: 6.2 % — AB (ref 4.0–5.6)

## 2023-06-22 MED ORDER — CONTRAVE 8-90 MG PO TB12
ORAL_TABLET | ORAL | 0 refills | Status: DC
Start: 2023-06-22 — End: 2023-10-01

## 2023-06-22 MED ORDER — BUPROPION HCL ER (XL) 150 MG PO TB24
150.0000 mg | ORAL_TABLET | Freq: Every day | ORAL | 0 refills | Status: DC
Start: 2023-06-22 — End: 2023-06-22

## 2023-06-22 MED ORDER — PREGABALIN 50 MG PO CAPS
50.0000 mg | ORAL_CAPSULE | Freq: Three times a day (TID) | ORAL | 0 refills | Status: DC
Start: 2023-06-22 — End: 2023-11-08

## 2023-06-22 MED ORDER — SERTRALINE HCL 100 MG PO TABS
100.0000 mg | ORAL_TABLET | Freq: Every morning | ORAL | 0 refills | Status: DC
Start: 2023-06-22 — End: 2023-10-01

## 2023-06-28 ENCOUNTER — Inpatient Hospital Stay: Payer: Medicare Other

## 2023-06-28 ENCOUNTER — Inpatient Hospital Stay: Payer: Medicare Other | Attending: Oncology

## 2023-07-03 ENCOUNTER — Inpatient Hospital Stay: Payer: Medicare Other | Attending: Oncology

## 2023-07-03 ENCOUNTER — Inpatient Hospital Stay: Payer: Medicare Other

## 2023-07-03 DIAGNOSIS — M8580 Other specified disorders of bone density and structure, unspecified site: Secondary | ICD-10-CM | POA: Insufficient documentation

## 2023-07-03 DIAGNOSIS — Z853 Personal history of malignant neoplasm of breast: Secondary | ICD-10-CM | POA: Diagnosis not present

## 2023-07-03 DIAGNOSIS — M810 Age-related osteoporosis without current pathological fracture: Secondary | ICD-10-CM

## 2023-07-03 DIAGNOSIS — C50911 Malignant neoplasm of unspecified site of right female breast: Secondary | ICD-10-CM

## 2023-07-03 DIAGNOSIS — Z8571 Personal history of Hodgkin lymphoma: Secondary | ICD-10-CM

## 2023-07-03 DIAGNOSIS — F32A Depression, unspecified: Secondary | ICD-10-CM | POA: Diagnosis not present

## 2023-07-03 DIAGNOSIS — G4733 Obstructive sleep apnea (adult) (pediatric): Secondary | ICD-10-CM | POA: Diagnosis not present

## 2023-07-03 LAB — BASIC METABOLIC PANEL - CANCER CENTER ONLY
Anion gap: 9 (ref 5–15)
BUN: 16 mg/dL (ref 8–23)
CO2: 25 mmol/L (ref 22–32)
Calcium: 9.2 mg/dL (ref 8.9–10.3)
Chloride: 99 mmol/L (ref 98–111)
Creatinine: 0.79 mg/dL (ref 0.44–1.00)
GFR, Estimated: >60 mL/min (ref >60)
Glucose, Bld: 111 mg/dL — ABNORMAL HIGH (ref 70–99)
Potassium: 3.8 mmol/L (ref 3.5–5.1)
Sodium: 133 mmol/L — ABNORMAL LOW (ref 135–145)

## 2023-07-03 MED ORDER — DENOSUMAB 60 MG/ML ~~LOC~~ SOSY
60.0000 mg | PREFILLED_SYRINGE | Freq: Once | SUBCUTANEOUS | Status: AC
  Administered 2023-07-03: 60 mg via SUBCUTANEOUS
  Filled 2023-07-03: qty 1

## 2023-07-03 MED ORDER — SODIUM CHLORIDE 0.9 % IV SOLN
Freq: Once | INTRAVENOUS | Status: DC
  Filled 2023-07-03: qty 250

## 2023-07-10 DIAGNOSIS — M4316 Spondylolisthesis, lumbar region: Secondary | ICD-10-CM | POA: Diagnosis not present

## 2023-07-14 ENCOUNTER — Ambulatory Visit
Admission: EM | Admit: 2023-07-14 | Discharge: 2023-07-14 | Disposition: A | Discharge status: Home / Self Care | Payer: Medicare Other | Attending: Physician Assistant | Admitting: Physician Assistant

## 2023-07-14 ENCOUNTER — Ambulatory Visit (INDEPENDENT_AMBULATORY_CARE_PROVIDER_SITE_OTHER): Payer: Medicare Other

## 2023-07-14 ENCOUNTER — Encounter: Payer: Self-pay | Admitting: Emergency Medicine

## 2023-07-14 DIAGNOSIS — R051 Acute cough: Secondary | ICD-10-CM

## 2023-07-14 DIAGNOSIS — J984 Other disorders of lung: Secondary | ICD-10-CM | POA: Diagnosis not present

## 2023-07-14 DIAGNOSIS — R059 Cough, unspecified: Secondary | ICD-10-CM | POA: Diagnosis not present

## 2023-07-14 DIAGNOSIS — R0989 Other specified symptoms and signs involving the circulatory and respiratory systems: Secondary | ICD-10-CM | POA: Diagnosis not present

## 2023-07-14 DIAGNOSIS — R9389 Abnormal findings on diagnostic imaging of other specified body structures: Secondary | ICD-10-CM | POA: Diagnosis not present

## 2023-07-14 DIAGNOSIS — J209 Acute bronchitis, unspecified: Secondary | ICD-10-CM

## 2023-07-14 MED ORDER — PROMETHAZINE-DM 6.25-15 MG/5ML PO SYRP: 5.0000 mL | ORAL_SOLUTION | Freq: Every evening | ORAL | 0 refills | Status: DC | PRN

## 2023-07-14 MED ORDER — AZITHROMYCIN 250 MG PO TABS: 250.0000 mg | ORAL_TABLET | Freq: Every day | ORAL | 0 refills | Status: DC

## 2023-07-14 MED ORDER — BENZONATATE 200 MG PO CAPS: 200.0000 mg | ORAL_CAPSULE | Freq: Three times a day (TID) | ORAL | 0 refills | Status: DC | PRN

## 2023-07-14 NOTE — Discharge Instructions (Signed)
-  Your x-ray is negative for pneumonia but given that you have had symptoms for 2 weeks that have recently worsened I sent antibiotics for you.  I also sent cough medication.  Increase rest and fluids. - You need to be seen again if you have a fever, worsening symptoms or not better in 2 more weeks.

## 2023-07-14 NOTE — ED Triage Notes (Signed)
Patient c/o cough and chest congestion that started 2 weeks ago.  Patient denies fevers.  Patient reports thick mucous.

## 2023-07-14 NOTE — ED Provider Notes (Signed)
[facility]    CSN: [id number] Arrival date & time: 07/14/23  1344      History   Chief Complaint Chief Complaint  Patient presents with   Cough    HPI Mary Todd is a 77 y.o. female presenting for approximately 2-week history of productive cough, sore throat, chest tightness, and congestion.  She reports feeling like there is mucus in her chest.  States mucus is thick and discolored.  Denies fever.  No report of any chest pain, wheezing, or shortness of breath.  Patient does have a history of pneumonia and this feels similar.  Other history includes hypertension, osteoporosis, breast cancer. Not taking any OTC meds.  HPI  Past Medical History:  Diagnosis Date   Allergy    Arthritis    knees, lower back   Cancer [facility])    Breast cancer   Esophageal reflux    Hypertension 2006   Osteoporosis    Personal history of malignant neoplasm of breast 2011   right breast   Personal history of radiation therapy 2011   RIGHT lumpectomy w/ radiation    Patient Active Problem List   Diagnosis Date Noted   Polyp of transverse colon    Total knee replacement status, right 10/03/2019   Anterolisthesis 08/06/2017   DDD (degenerative disc disease), lumbosacral 08/06/2017   Chondromalacia patellae 07/03/2017   Osteoporosis 12/14/2016   Morbid obesity (HCC) 07/17/2016   Prolapsed cervical intervertebral disc 06/06/2016   Spinal stenosis in cervical region 05/22/2016   Thyroid nodule 05/22/2016   GERD without esophagitis 03/16/2016   Neck muscle spasm 03/16/2016   History of back surgery 03/16/2016   Osteoarthritis of both knees 03/16/2016   Large breasts 03/16/2016   Hypertension, benign 03/16/2016   Perennial allergic rhinitis with seasonal variation 03/16/2016   History of pneumococcal pneumonia 09/20/2015   History of Hodgkin's lymphoma 07/15/2014   Breast cancer, right (HCC) 06/26/2013    Past Surgical History:  Procedure Laterality Date    ABDOMINAL HYSTERECTOMY     BACK SURGERY     BREAST BIOPSY Right 2011   radiation   BREAST LUMPECTOMY Right 2011   w/ radiation   BREAST SURGERY Right 2011   right breast lumpectomy,L/SN/R for right breast CA    CATARACT EXTRACTION W/PHACO Left 06/10/2018   Procedure: CATARACT EXTRACTION PHACO AND INTRAOCULAR LENS PLACEMENT (IOC) LEFT;  Surgeon: Nevada Crane, MD;  Location: [facility];  Service: Ophthalmology;  Laterality: Left;  Diabetic - oral meds   CATARACT EXTRACTION W/PHACO Right 07/15/2018   Procedure: CATARACT EXTRACTION PHACO AND INTRAOCULAR LENS PLACEMENT (IOC) RIGHT;  Surgeon: Nevada Crane, MD;  Location: [facility];  Service: Ophthalmology;  Laterality: Right;   COLONOSCOPY  2003   COLONOSCOPY N/A 03/09/2022   Procedure: COLONOSCOPY WITH BIOPSY;  Surgeon: Midge Minium, MD;  Location: [facility];  Service: Endoscopy;  Laterality: N/A;   POLYPECTOMY N/A 03/09/2022   Procedure: POLYPECTOMY;  Surgeon: Midge Minium, MD;  Location: [facility];  Service: Endoscopy;  Laterality: N/A;   PORT A CATH REVISION     REPLACEMENT TOTAL KNEE Right 10/03/2019   DR. Odis Luster    WRIST SURGERY  2005    OB History     Gravida  2   Para  2   Term      Preterm      AB      Living  2      SAB      IAB  Ectopic      Multiple      Live Births           Obstetric Comments  1st Menstrual Cycle:  12 1st Pregnancy:  22.          Home Medications    Prior to Admission medications   Medication Sig Start Date End Date Taking? Authorizing Provider  azithromycin (ZITHROMAX) 250 MG tablet Take 1 tablet (250 mg total) by mouth daily. Take first 2 tablets together, then 1 every day until finished. 07/14/23  Yes Shirlee Latch, PA-C  benzonatate (TESSALON) 200 MG capsule Take 1 capsule (200 mg total) by mouth 3 (three) times daily as needed for cough. 07/14/23  Yes Shirlee Latch, PA-C  promethazine-dextromethorphan (PROMETHAZINE-DM)  6.25-15 MG/5ML syrup Take 5 mLs by mouth at bedtime as needed for cough. 07/14/23  Yes Shirlee Latch, PA-C  acetaminophen (TYLENOL) 650 MG CR tablet Take 1,300 mg by mouth every 8 (eight) hours as needed for pain.    [provider]  amLODipine-valsartan (EXFORGE) 5-160 MG tablet Take 1 tablet by mouth daily. 03/21/23   Alba Cory, MD  atorvastatin (LIPITOR) 20 MG tablet Take 1 tablet (20 mg total) by mouth daily. 03/21/23   Alba Cory, MD  CALCIUM-MAGNESIUM PO Take 1 tablet by mouth daily.    [provider]  cetirizine (ZYRTEC) 10 MG tablet Take 10 mg by mouth daily.    [provider]  Cholecalciferol (VITAMIN D-3 PO) Take 1 tablet by mouth daily.    [provider]  denosumab (PROLIA) 60 MG/ML SOSY injection Inject 60 mg into the skin every 6 (six) months.    Rosey Bath, MD  meloxicam (MOBIC) 15 MG tablet Take 1 tablet (15 mg total) by mouth daily. 03/21/23   Alba Cory, MD  metoprolol succinate (TOPROL-XL) 50 MG 24 hr tablet Take 1 tablet (50 mg total) by mouth daily. Take with or immediately following a meal. 03/21/23   Alba Cory, MD  Multiple Vitamin (MULTIVITAMIN) tablet Take 1 tablet by mouth daily.    [provider]  Naltrexone-buPROPion HCl ER (CONTRAVE) 8-90 MG TB12 Start 1 tablet every morning for 7 days, then 1 tablet twice daily for 7 days, then 2 tablets every morning and one every evening 06/22/23   Alba Cory, MD  nystatin (MYCOSTATIN/NYSTOP) powder APPLY  POWDER TOPICALLY TWICE DAILY 04/16/23   Alba Cory, MD  pantoprazole (PROTONIX) 40 MG tablet Take 1 tablet (40 mg total) by mouth daily. 03/21/23   Alba Cory, MD  pregabalin (LYRICA) 50 MG capsule Take 1 capsule (50 mg total) by mouth 3 (three) times daily. Start at night and go up to three times daily as tolerated 06/22/23   Alba Cory, MD  sertraline (ZOLOFT) 100 MG tablet Take 1 tablet (100 mg total) by mouth every morning. 06/22/23    Alba Cory, MD  tolterodine (DETROL LA) 4 MG 24 hr capsule Take 1 capsule (4 mg total) by mouth daily. 05/14/23   MacDiarmid, Lorin Picket, MD  Vibegron (GEMTESA) 75 MG TABS Take 1 tablet (75 mg total) by mouth daily. 05/14/23   Alfredo Martinez, MD    Family History Family History  Problem Relation Age of Onset   Cancer Father    Bone cancer Father    Breast cancer Mother 26   Stroke Mother    Colon cancer Maternal Grandmother    Breast cancer Cousin     Social History Social History   Tobacco Use  Smoking status: Never    Passive exposure: Never   Smokeless tobacco: Never   Tobacco comments:    smoking cessation materials not required  Vaping Use   Vaping status: Never Used  Substance Use Topics   Alcohol use: Yes    Comment: occasionally   Drug use: No     Allergies   Shrimp [shellfish allergy] and Augmentin [amoxicillin-pot clavulanate]   Review of Systems Review of Systems  Constitutional:  Positive for fatigue. Negative for chills, diaphoresis and fever.  HENT:  Positive for congestion, rhinorrhea and sore throat. Negative for ear pain, sinus pressure and sinus pain.   Respiratory:  Positive for cough and chest tightness. Negative for shortness of breath.   Cardiovascular:  Negative for chest pain.  Gastrointestinal:  Negative for abdominal pain, nausea and vomiting.  Musculoskeletal:  Negative for arthralgias and myalgias.  Skin:  Negative for rash.  Neurological:  Negative for weakness and headaches.  Hematological:  Negative for adenopathy.     Physical Exam Triage Vital Signs ED Triage Vitals  Encounter Vitals Group     BP 07/14/23 1427 (!) 148/88     Systolic BP Percentile --      Diastolic BP Percentile --      Pulse Rate 07/14/23 1427 75     Resp 07/14/23 1427 15     Temp 07/14/23 1427 98.3 F (36.8 C)     Temp Source 07/14/23 1427 Oral     SpO2 07/14/23 1427 95 %     Weight 07/14/23 1426 238 lb 1.6 oz (108 kg)     Height 07/14/23 1426 5'  3" (1.6 m)     Head Circumference --      Peak Flow --      Pain Score 07/14/23 1426 0     Pain Loc --      Pain Education --      Exclude from Growth Chart --    No data found.  Updated Vital Signs BP (!) 148/88 (BP Location: Left Arm)   Pulse 75   Temp 98.3 F (36.8 C) (Oral)   Resp 15   Ht 5\' 3"  (1.6 m)   Wt 238 lb 1.6 oz (108 kg)   SpO2 95%   BMI 42.18 kg/m    Physical Exam Vitals and nursing note reviewed.  Constitutional:      General: She is not in acute distress.    Appearance: Normal appearance. She is not ill-appearing or toxic-appearing.  HENT:     Head: Normocephalic and atraumatic.     Nose: Congestion present.     Mouth/Throat:     Mouth: Mucous membranes are moist.     Pharynx: Oropharynx is clear.  Eyes:     General: No scleral icterus.       Right eye: No discharge.        Left eye: No discharge.     Conjunctiva/sclera: Conjunctivae normal.  Cardiovascular:     Rate and Rhythm: Normal rate and regular rhythm.     Heart sounds: Normal heart sounds.  Pulmonary:     Effort: Pulmonary effort is normal. No respiratory distress.     Breath sounds: Normal breath sounds. No wheezing, rhonchi or rales.  Musculoskeletal:     Cervical back: Neck supple.  Skin:    General: Skin is dry.  Neurological:     General: No focal deficit present.     Mental Status: She is alert. Mental status is at baseline.  Motor: No weakness.     Gait: Gait normal.  Psychiatric:        Mood and Affect: Mood normal.        Behavior: Behavior normal.        Thought Content: Thought content normal.      UC Treatments / Results  Labs (all labs ordered are listed, but only abnormal results are displayed) Labs Reviewed - No data to display  EKG   Radiology DG Chest 2 View  Result Date: 07/14/2023 CLINICAL DATA:  Cough and congestion for 2 weeks EXAM: CHEST - 2 VIEW COMPARISON:  11/02/2022 FINDINGS: Frontal and lateral views of the chest demonstrate an unremarkable  cardiac silhouette. Chronic scarring at the left lung base. No acute airspace disease, effusion, or pneumothorax. Chronic elevation the right hemidiaphragm. No acute bony abnormalities. IMPRESSION: 1. Stable left basilar scarring.  No acute airspace disease. Electronically Signed   By: Sharlet Salina M.D.   On: 07/14/2023 14:57    Procedures Procedures (including critical care time)  Medications Ordered in UC Medications - No data to display  Initial Impression / Assessment and Plan / UC Course  I have reviewed the triage vital signs and the nursing notes.  Pertinent labs & imaging results that were available during my care of the patient were reviewed by me and considered in my medical decision making (see chart for details).   77 year old female presenting for 2-week history of productive cough, sore throat, chest tightness, and congestion as well as fatigue.  History of pneumonia.  She is afebrile.  Overall well-appearing.  No acute distress.  Has nasal congestion.  Chest clear.  Chest x-ray obtained to assess for possible pneumonia given duration of symptoms.  Negative for pneumonia.  Chest x-ray results discussed with patient.  Will treat for possible bacterial bronchitis with azithromycin given duration of symptoms.  Also sent Promethazine DM for nighttime and benzonatate.  Supportive care discussed.  Reviewed return precautions.   Final Clinical Impressions(s) / UC Diagnoses   Final diagnoses:  Acute bronchitis, unspecified organism  Acute cough     Discharge Instructions      -Your x-ray is negative for pneumonia but given that you have had symptoms for 2 weeks that have recently worsened I sent antibiotics for you.  I also sent cough medication.  Increase rest and fluids. - You need to be seen again if you have a fever, worsening symptoms or not better in 2 more weeks.     ED Prescriptions     Medication Sig Dispense Auth. Provider   azithromycin (ZITHROMAX) 250  MG tablet Take 1 tablet (250 mg total) by mouth daily. Take first 2 tablets together, then 1 every day until finished. 6 tablet Shirlee Latch, PA-C   promethazine-dextromethorphan (PROMETHAZINE-DM) 6.25-15 MG/5ML syrup Take 5 mLs by mouth at bedtime as needed for cough. 118 mL Eusebio Friendly B, PA-C   benzonatate (TESSALON) 200 MG capsule Take 1 capsule (200 mg total) by mouth 3 (three) times daily as needed for cough. 30 capsule Shirlee Latch, PA-C      PDMP not reviewed this encounter.   Shirlee Latch, PA-C 07/14/23 [phone]

## 2023-07-25 DIAGNOSIS — W03XXXA Other fall on same level due to collision with another person, initial encounter: Secondary | ICD-10-CM | POA: Diagnosis not present

## 2023-07-25 DIAGNOSIS — Y92211 Elementary school as the place of occurrence of the external cause: Secondary | ICD-10-CM | POA: Diagnosis not present

## 2023-07-25 DIAGNOSIS — M84451A Pathological fracture, right femur, initial encounter for fracture: Secondary | ICD-10-CM | POA: Diagnosis not present

## 2023-07-25 DIAGNOSIS — Y990 Civilian activity done for income or pay: Secondary | ICD-10-CM | POA: Diagnosis not present

## 2023-07-30 ENCOUNTER — Ambulatory Visit
Admission: RE | Admit: 2023-07-30 | Discharge: 2023-07-30 | Disposition: A | Discharge status: Home / Self Care | Payer: Medicare Other | Source: Ambulatory Visit | Attending: Oncology | Admitting: Oncology

## 2023-07-30 DIAGNOSIS — M1712 Unilateral primary osteoarthritis, left knee: Secondary | ICD-10-CM | POA: Diagnosis not present

## 2023-07-30 DIAGNOSIS — Z1231 Encounter for screening mammogram for malignant neoplasm of breast: Secondary | ICD-10-CM

## 2023-07-30 DIAGNOSIS — Z96651 Presence of right artificial knee joint: Secondary | ICD-10-CM | POA: Diagnosis not present

## 2023-08-03 DIAGNOSIS — M5459 Other low back pain: Secondary | ICD-10-CM | POA: Diagnosis not present

## 2023-08-07 DIAGNOSIS — M5459 Other low back pain: Secondary | ICD-10-CM | POA: Diagnosis not present

## 2023-08-15 DIAGNOSIS — M5459 Other low back pain: Secondary | ICD-10-CM | POA: Diagnosis not present

## 2023-08-17 DIAGNOSIS — M5459 Other low back pain: Secondary | ICD-10-CM | POA: Diagnosis not present

## 2023-08-22 DIAGNOSIS — M5459 Other low back pain: Secondary | ICD-10-CM | POA: Diagnosis not present

## 2023-08-24 DIAGNOSIS — M5459 Other low back pain: Secondary | ICD-10-CM | POA: Diagnosis not present

## 2023-08-27 DIAGNOSIS — Z6838 Body mass index (BMI) 38.0-38.9, adult: Secondary | ICD-10-CM | POA: Diagnosis not present

## 2023-08-27 DIAGNOSIS — M5416 Radiculopathy, lumbar region: Secondary | ICD-10-CM | POA: Diagnosis not present

## 2023-08-27 DIAGNOSIS — M4316 Spondylolisthesis, lumbar region: Secondary | ICD-10-CM | POA: Diagnosis not present

## 2023-08-28 DIAGNOSIS — M5459 Other low back pain: Secondary | ICD-10-CM | POA: Diagnosis not present

## 2023-09-10 DIAGNOSIS — Z96651 Presence of right artificial knee joint: Secondary | ICD-10-CM | POA: Diagnosis not present

## 2023-09-10 DIAGNOSIS — M1711 Unilateral primary osteoarthritis, right knee: Secondary | ICD-10-CM | POA: Diagnosis not present

## 2023-09-24 ENCOUNTER — Ambulatory Visit: Payer: Medicare Other | Admitting: Family Medicine

## 2023-09-26 NOTE — Progress Notes (Signed)
Name: Mary Todd   MRN: [id number]    DOB: [birth year]   Date:10/01/2023       Progress Note  Subjective  Chief Complaint  Chief Complaint  Patient presents with   Medical Management of Chronic Issues    HPI  Discussed the use of AI scribe software for clinical note transcription with the patient, who gave verbal consent to proceed.  History of Present Illness   The patient reported a new issue of increased flatulence over the past several months, which occasionally leads to fecal incontinence. The patient denied any changes in diet or eating habits that could contribute to this issue. The patient also reported a single episode of significant fecal incontinence requiring a change of clothes approximately six months ago.  The patient's back pain, which is chronic and associated with multilevel arthritis, has remained stable. The patient reported occasional radiation of the pain down the left leg. The patient is currently managing the back pain with occasional use of Lyrica   The patient also reported a recent fall due to knee instability, which resulted in a flare-up of knee pain. This was managed with an injection in the left knee a couple of months ago by Dr. Odis Luster, her ortho, , which significantly improved the pain.  The patient has a history of breast cancer and Hodgkin's lymphoma, both of which are currently under control with regular monitoring by Dr. Cathie Hoops.    The patient also has a history of atherosclerosis of the ischemic demyelinating of brain, which is being managed with statin therapy and blood pressure control .  The patient has been diagnosed with prediabetes, with the most recent A1c at 6.2%. The patient denied any symptoms of diabetes.    The patient also has a history of osteoporosis and is currently receiving Prolia injections every six months.  The patient has been diagnosed with dysthymia and is currently taking sertraline, which has been effective but  would like to resume Wellbutrin to improve fatigue. That was stopped after syncopal episode but Dr. Sherryll Burger - neurologist  - ruled out seizures and we will resume medication today   The patient has a history of morbid obesity and has attempted various weight loss strategies, including medication and Weight Watchers, with limited success. The patient reported ongoing struggles with portion control, particularly with desserts and rolls.  The patient also reported a history of mild obstructive sleep apnea, diagnosed in 2022, but has not initiated therapy due to intolerance of the mask. .         Patient Active Problem List   Diagnosis Date Noted   Chronic midline low back pain with sciatica 10/01/2023   Hodgkin's disease, stage IIA (HCC) 10/01/2023   Ischemic demyelination of brain (HCC) 10/01/2023   Atherosclerosis of aorta (HCC) 10/01/2023   Polyp of transverse colon    Total knee replacement status, right 10/03/2019   Anterolisthesis 08/06/2017   DDD (degenerative disc disease), lumbosacral 08/06/2017   Chondromalacia patellae 07/03/2017   Osteoporosis 12/14/2016   Morbid obesity (HCC) 07/17/2016   Prolapsed cervical intervertebral disc 06/06/2016   Spinal stenosis in cervical region 05/22/2016   Thyroid nodule 05/22/2016   GERD without esophagitis 03/16/2016   Neck muscle spasm 03/16/2016   History of back surgery 03/16/2016   Osteoarthritis of both knees 03/16/2016   Large breasts 03/16/2016   Essential hypertension 03/16/2016   Perennial allergic rhinitis with seasonal variation 03/16/2016   History of pneumococcal pneumonia 09/20/2015   History of Hodgkin's lymphoma 07/15/2014  Breast cancer, right (HCC) 06/26/2013    Past Surgical History:  Procedure Laterality Date   ABDOMINAL HYSTERECTOMY     BACK SURGERY     BREAST BIOPSY Right 2011   radiation   BREAST LUMPECTOMY Right 2011   w/ radiation   BREAST SURGERY Right 2011   right breast lumpectomy,L/SN/R for right breast  CA    CATARACT EXTRACTION W/PHACO Left 06/10/2018   Procedure: CATARACT EXTRACTION PHACO AND INTRAOCULAR LENS PLACEMENT (IOC) LEFT;  Surgeon: Nevada Crane, MD;  Location: [facility];  Service: Ophthalmology;  Laterality: Left;  Diabetic - oral meds   CATARACT EXTRACTION W/PHACO Right 07/15/2018   Procedure: CATARACT EXTRACTION PHACO AND INTRAOCULAR LENS PLACEMENT (IOC) RIGHT;  Surgeon: Nevada Crane, MD;  Location: [facility];  Service: Ophthalmology;  Laterality: Right;   COLONOSCOPY  2003   COLONOSCOPY N/A 03/09/2022   Procedure: COLONOSCOPY WITH BIOPSY;  Surgeon: Midge Minium, MD;  Location: [facility];  Service: Endoscopy;  Laterality: N/A;   POLYPECTOMY N/A 03/09/2022   Procedure: POLYPECTOMY;  Surgeon: Midge Minium, MD;  Location: [facility];  Service: Endoscopy;  Laterality: N/A;   PORT A CATH REVISION     REPLACEMENT TOTAL KNEE Right 10/03/2019   DR. Odis Luster    WRIST SURGERY  2005    Family History  Problem Relation Age of Onset   Cancer Father    Bone cancer Father    Breast cancer Mother 55   Stroke Mother    Colon cancer Maternal Grandmother    Breast cancer Cousin     Social History   Tobacco Use   Smoking status: Never    Passive exposure: Never   Smokeless tobacco: Never   Tobacco comments:    smoking cessation materials not required  Substance Use Topics   Alcohol use: Yes    Comment: occasionally     Current Outpatient Medications:    acetaminophen (TYLENOL) 650 MG CR tablet, Take 1,300 mg by mouth every 8 (eight) hours as needed for pain., Disp: , Rfl:    buPROPion (WELLBUTRIN XL) 150 MG 24 hr tablet, Take 1 tablet (150 mg total) by mouth daily., Disp: 90 tablet, Rfl: 1   CALCIUM-MAGNESIUM PO, Take 1 tablet by mouth daily., Disp: , Rfl:    cetirizine (ZYRTEC) 10 MG tablet, Take 10 mg by mouth daily., Disp: , Rfl:    Cholecalciferol (VITAMIN D-3 PO), Take 1 tablet by mouth daily., Disp: , Rfl:    denosumab  (PROLIA) 60 MG/ML SOSY injection, Inject 60 mg into the skin every 6 (six) months., Disp: , Rfl:    hydrocortisone 2.5%-nystatin-zinc oxide 20% 1:1:1 ointment mixture, Apply topically 2 (two) times daily as needed., Disp: 240 g, Rfl: 1   meloxicam (MOBIC) 15 MG tablet, Take 1 tablet (15 mg total) by mouth daily., Disp: 90 tablet, Rfl: 1   Multiple Vitamin (MULTIVITAMIN) tablet, Take 1 tablet by mouth daily., Disp: , Rfl:    nystatin (MYCOSTATIN/NYSTOP) powder, APPLY  POWDER TOPICALLY TWICE DAILY, Disp: 60 g, Rfl: 0   pregabalin (LYRICA) 50 MG capsule, Take 1 capsule (50 mg total) by mouth 3 (three) times daily. Start at night and go up to three times daily as tolerated, Disp: 90 capsule, Rfl: 0   tolterodine (DETROL LA) 4 MG 24 hr capsule, Take 1 capsule (4 mg total) by mouth daily., Disp: 90 capsule, Rfl: 3   Vibegron (GEMTESA) 75 MG TABS, Take 1 tablet (75 mg total) by mouth daily., Disp: 30  tablet, Rfl: 11   amLODipine-valsartan (EXFORGE) 5-160 MG tablet, Take 1 tablet by mouth daily., Disp: 90 tablet, Rfl: 1   atorvastatin (LIPITOR) 20 MG tablet, Take 1 tablet (20 mg total) by mouth daily., Disp: 90 tablet, Rfl: 1   metoprolol succinate (TOPROL-XL) 50 MG 24 hr tablet, Take 1 tablet (50 mg total) by mouth daily. Take with or immediately following a meal., Disp: 90 tablet, Rfl: 1   pantoprazole (PROTONIX) 40 MG tablet, Take 1 tablet (40 mg total) by mouth daily., Disp: 90 tablet, Rfl: 1   sertraline (ZOLOFT) 100 MG tablet, Take 1 tablet (100 mg total) by mouth every morning., Disp: 90 tablet, Rfl: 1  Allergies  Allergen Reactions   Shrimp [Shellfish Allergy] Hives   Augmentin [Amoxicillin-Pot Clavulanate] Nausea Only    I personally reviewed active problem list, medication list, allergies, family history with the patient/caregiver today.   ROS  Ten systems reviewed and is negative except as mentioned in HPI    Objective  Vitals:   10/01/23 1120  BP: 134/74  Pulse: 87  Resp: 16   Temp: 98 F (36.7 C)  TempSrc: Oral  SpO2: 96%  Weight: 237 lb 6.4 oz (107.7 kg)  Height: 5\' 3"  (1.6 m)    Body mass index is 42.05 kg/m.  Physical Exam  Constitutional: Patient appears well-developed and well-nourished. Obese  No distress.  HEENT: head atraumatic, normocephalic, pupils equal and reactive to light, neck supple Cardiovascular: Normal rate, regular rhythm and normal heart sounds.  No murmur heard. No BLE edema. Pulmonary/Chest: Effort normal and breath sounds normal. No respiratory distress. Pelvic exam: violaceous mass on left vaginal wall, cystocele present, rectal exam normal external exam, normal tonus, lots of hard stools on cecum  Abdominal: Soft.  There is no tenderness. Psychiatric: Patient has a normal mood and affect. behavior is normal. Judgment and thought content normal.     PHQ2/9:    10/01/2023   11:20 AM 06/22/2023   10:08 AM 06/21/2023    3:35 PM 03/21/2023   10:22 AM 12/20/2022    9:39 AM  Depression screen PHQ 2/9  Decreased Interest 0 0 0 0 2  Down, Depressed, Hopeless 0 0 0 0 2  PHQ - 2 Score 0 0 0 0 4  Altered sleeping 0 0  0 0  Tired, decreased energy 0 0  0 1  Change in appetite 0 0  0 0  Feeling bad or failure about yourself  0 0  0 1  Trouble concentrating 0 0  0 0  Moving slowly or fidgety/restless 0 0  0 0  Suicidal thoughts 0 0  0 0  PHQ-9 Score 0 0  0 6  Difficult doing work/chores Not difficult at all Not difficult at all   Somewhat difficult    phq 9 is negative   Fall Risk:    10/01/2023   11:20 AM 06/22/2023   10:07 AM 06/21/2023    3:31 PM 06/21/2023    3:30 PM 03/21/2023   10:22 AM  Fall Risk   Falls in the past year? 1 0 0  1  Number falls in past yr: 0 0 0  0  Injury with Fall? 0 0 0  0  Risk for fall due to : Impaired balance/gait No Fall Risks No Fall Risks No Fall Risks History of fall(s)  Follow up Falls prevention discussed;Education provided;Falls evaluation completed Falls prevention discussed;Education  provided;Falls evaluation completed Education provided;Falls prevention discussed Education provided;Falls prevention discussed Falls  prevention discussed;Education provided;Falls evaluation completed     Functional Status Survey: Is the patient deaf or have difficulty hearing?: No Does the patient have difficulty seeing, even when wearing glasses/contacts?: No Does the patient have difficulty concentrating, remembering, or making decisions?: No Does the patient have difficulty walking or climbing stairs?: Yes Does the patient have difficulty dressing or bathing?: No Does the patient have difficulty doing errands alone such as visiting a doctor's office or shopping?: No    Assessment & Plan  Assessment and Plan    Bowel Incontinence and Increased Flatulence Recent onset of uncontrolled gas and occasional stool incontinence. Possible pelvic floor dysfunction or nerve impingement from lumbar spine arthritis. No associated rectal bulging or blood in stool. -Refer to Dr. Daleen Squibb for further evaluation and possible pelvic floor therapy. -Advise patient to discuss with pain management doctor.  Urge Incontinence Reports urgency with urination but no incontinence with coughing or sneezing. Currently on Detrol. -Continue Detrol as prescribed.  Lumbar Spine Arthritis History of lumbar spine arthritis with recent increase in bowel symptoms. No recent worsening of back pain. Currently on occasional pain medication. -Advise patient to discuss bowel symptoms with pain management doctor.  Gastroesophageal Reflux Disease (GERD) Controlled on daily Pantoprazole. -Continue Pantoprazole as prescribed.  Dysthymia Managed on Sertraline, previously on Wellbutrin which provided more energy. -Resume Wellbutrin for added energy benefit.  Osteoporosis Managed with Prolia injections and Vitamin D supplementation. -Continue current management.  Morbid Obesity History of unsuccessful weight loss attempts  with Contrave and Weight Watchers. -Encourage continued dietary modifications and physical activity.  Degenerative Disc Disease (DDD) Reports pain in lower back occasionally radiating down legs. Currently on occasional Pregabalin. -Advise consistent use of Pregabalin for better pain control.  Vaginal mass and Cystocele Noted on pelvic exam. -Refer to Gynecology for further evaluation and management.  Vitamin B12 Deficiency Not currently supplementing. -Advise over-the-counter B12 supplementation a few times a week.  General Health Maintenance -Continue monitoring for prediabetes with dietary modifications. -Continue Atorvastatin for atherosclerosis management. -Continue Metoprolol and Exforge for hypertension management. -Continue regular follow-ups with oncologist for history of breast cancer and Hodgkin's lymphoma. -Consider daily Miralax for constipation. -Follow-up in 4 months.

## 2023-10-01 ENCOUNTER — Encounter: Payer: Self-pay | Admitting: Family Medicine

## 2023-10-01 ENCOUNTER — Ambulatory Visit (INDEPENDENT_AMBULATORY_CARE_PROVIDER_SITE_OTHER): Payer: Medicare Other | Admitting: Family Medicine

## 2023-10-01 VITALS — BP 134/74 | HR 87 | Temp 98.0°F | Resp 16 | Ht 63.0 in | Wt 237.4 lb

## 2023-10-01 DIAGNOSIS — K219 Gastro-esophageal reflux disease without esophagitis: Secondary | ICD-10-CM

## 2023-10-01 DIAGNOSIS — I700 Atherosclerosis of aorta: Secondary | ICD-10-CM

## 2023-10-01 DIAGNOSIS — L304 Erythema intertrigo: Secondary | ICD-10-CM | POA: Diagnosis not present

## 2023-10-01 DIAGNOSIS — I10 Essential (primary) hypertension: Secondary | ICD-10-CM | POA: Diagnosis not present

## 2023-10-01 DIAGNOSIS — G3789 Other specified demyelinating diseases of central nervous system: Secondary | ICD-10-CM

## 2023-10-01 DIAGNOSIS — R159 Full incontinence of feces: Secondary | ICD-10-CM | POA: Diagnosis not present

## 2023-10-01 DIAGNOSIS — N898 Other specified noninflammatory disorders of vagina: Secondary | ICD-10-CM

## 2023-10-01 DIAGNOSIS — N8110 Cystocele, unspecified: Secondary | ICD-10-CM

## 2023-10-01 DIAGNOSIS — F341 Dysthymic disorder: Secondary | ICD-10-CM | POA: Diagnosis not present

## 2023-10-01 DIAGNOSIS — C8190 Hodgkin lymphoma, unspecified, unspecified site: Secondary | ICD-10-CM | POA: Diagnosis not present

## 2023-10-01 DIAGNOSIS — G8929 Other chronic pain: Secondary | ICD-10-CM | POA: Insufficient documentation

## 2023-10-01 DIAGNOSIS — Z23 Encounter for immunization: Secondary | ICD-10-CM

## 2023-10-01 MED ORDER — PANTOPRAZOLE SODIUM 40 MG PO TBEC
40.0000 mg | DELAYED_RELEASE_TABLET | Freq: Every day | ORAL | 1 refills | Status: DC
Start: 2023-10-01 — End: 2024-05-27

## 2023-10-01 MED ORDER — METOPROLOL SUCCINATE ER 50 MG PO TB24
50.0000 mg | ORAL_TABLET | Freq: Every day | ORAL | 1 refills | Status: DC
Start: 2023-10-01 — End: 2024-05-27

## 2023-10-01 MED ORDER — AMLODIPINE BESYLATE-VALSARTAN 5-160 MG PO TABS
1.0000 | ORAL_TABLET | Freq: Every day | ORAL | 1 refills | Status: DC
Start: 2023-10-01 — End: 2024-05-07

## 2023-10-01 MED ORDER — ZINC OXIDE 20 % EX OINT
TOPICAL_OINTMENT | Freq: Two times a day (BID) | CUTANEOUS | 1 refills | Status: DC | PRN
Start: 2023-10-01 — End: 2023-11-08

## 2023-10-01 MED ORDER — SERTRALINE HCL 100 MG PO TABS
100.0000 mg | ORAL_TABLET | Freq: Every morning | ORAL | 1 refills | Status: DC
Start: 2023-10-01 — End: 2024-04-21

## 2023-10-01 MED ORDER — BUPROPION HCL ER (XL) 150 MG PO TB24: 150.0000 mg | ORAL_TABLET | Freq: Every day | ORAL | 1 refills | Status: DC

## 2023-10-01 MED ORDER — ATORVASTATIN CALCIUM 20 MG PO TABS
20.0000 mg | ORAL_TABLET | Freq: Every day | ORAL | 1 refills | Status: DC
Start: 2023-10-01 — End: 2024-05-07

## 2023-10-03 ENCOUNTER — Telehealth: Payer: Self-pay | Admitting: Family Medicine

## 2023-10-03 NOTE — Telephone Encounter (Signed)
Patient called said her insurance will not cover hydrocortisone 2.5%-nystatin-zinc oxide 20% 1:1:1 ointment mixture and she needs an alternate medication called in to [facility]

## 2023-10-05 ENCOUNTER — Other Ambulatory Visit: Payer: Self-pay | Admitting: Family Medicine

## 2023-10-05 DIAGNOSIS — M170 Bilateral primary osteoarthritis of knee: Secondary | ICD-10-CM

## 2023-10-05 NOTE — Telephone Encounter (Signed)
Pt.notified

## 2023-10-15 DIAGNOSIS — Z96651 Presence of right artificial knee joint: Secondary | ICD-10-CM | POA: Diagnosis not present

## 2023-10-15 DIAGNOSIS — M1712 Unilateral primary osteoarthritis, left knee: Secondary | ICD-10-CM | POA: Diagnosis not present

## 2023-10-28 ENCOUNTER — Encounter: Payer: Self-pay | Admitting: Emergency Medicine

## 2023-10-28 ENCOUNTER — Ambulatory Visit
Admission: EM | Admit: 2023-10-28 | Discharge: 2023-10-28 | Disposition: A | Discharge status: Home / Self Care | Payer: Medicare Other | Attending: Family Medicine | Admitting: Family Medicine

## 2023-10-28 ENCOUNTER — Encounter: Payer: Self-pay | Admitting: Family Medicine

## 2023-10-28 DIAGNOSIS — J020 Streptococcal pharyngitis: Secondary | ICD-10-CM

## 2023-10-28 DIAGNOSIS — N898 Other specified noninflammatory disorders of vagina: Secondary | ICD-10-CM | POA: Diagnosis not present

## 2023-10-28 LAB — WET PREP, GENITAL
Clue Cells Wet Prep HPF POC: NONE SEEN
Sperm: NONE SEEN
Trich, Wet Prep: NONE SEEN
WBC, Wet Prep HPF POC: >=10 — AB (ref <10)
Yeast Wet Prep HPF POC: NONE SEEN

## 2023-10-28 LAB — GROUP A STREP BY PCR: Group A Strep by PCR: DETECTED — AB

## 2023-10-28 LAB — SARS CORONAVIRUS 2 BY RT PCR: SARS Coronavirus 2 by RT PCR: NEGATIVE

## 2023-10-28 MED ORDER — FLUCONAZOLE 150 MG PO TABS: 150.0000 mg | ORAL_TABLET | Freq: Once | ORAL | 0 refills | Status: AC

## 2023-10-28 MED ORDER — CEFDINIR 300 MG PO CAPS: 300.0000 mg | ORAL_CAPSULE | Freq: Two times a day (BID) | ORAL | 0 refills | Status: AC

## 2023-10-28 NOTE — Discharge Instructions (Signed)
Stop by the pharmacy to pick up your prescriptions.  Follow up with your primary care provider as needed.  

## 2023-10-28 NOTE — ED Provider Notes (Addendum)
[facility]    CSN: [id number] Arrival date & time: 10/28/23  1125      History   Chief Complaint Chief Complaint  Patient presents with   Sore Throat   Vaginal Discharge     HPI HPI Mary Todd is a 77 y.o. female.    Mary Todd presents for sore throat for 4 days. Has neck pain due to the swollen glands. No fever, coughing, rhinorrhea, nasal congestion, vomiting or diarrhea. Nothing tried for sore throat.    Has light yellow vaginal discharge that started 5 days ago.  Has some vaginal irritation due to the dischage. She needed to wear a pad as it was wetting your underwear. Tried nothing prior to arrival.  Has not had any antibiotics in last 30 days.She is not currently sexually active. No dysuria.    No LMP recorded. Patient has had a hysterectomy.    - Abnormal vaginal discharge: yes - vaginal odor: yes - vaginal bleeding: no - Dysuria: no - Hematuria: no - Urinary urgency:no  - Urinary frequency: no  - Fever: no - Abdominal pain: no  - Pelvic pain: no - Rash/Skin lesions/mouth ulcers: no - Nausea: no  - Vomiting: no  - Back Pain: not new        Past Medical History:  Diagnosis Date   Allergy    Arthritis    knees, lower back   Cancer (HCC)    Breast cancer   Esophageal reflux    Hypertension 2006   Osteoporosis    Personal history of malignant neoplasm of breast 2011   right breast   Personal history of radiation therapy 2011   RIGHT lumpectomy w/ radiation    Patient Active Problem List   Diagnosis Date Noted   Chronic midline low back pain with sciatica 10/01/2023   Hodgkin's disease, stage IIA (HCC) 10/01/2023   Ischemic demyelination of brain (HCC) 10/01/2023   Atherosclerosis of aorta (HCC) 10/01/2023   Polyp of transverse colon    Total knee replacement status, right 10/03/2019   Anterolisthesis 08/06/2017   DDD (degenerative disc disease), lumbosacral 08/06/2017   Chondromalacia patellae  07/03/2017   Osteoporosis 12/14/2016   Morbid obesity (HCC) 07/17/2016   Prolapsed cervical intervertebral disc 06/06/2016   Spinal stenosis in cervical region 05/22/2016   Thyroid nodule 05/22/2016   GERD without esophagitis 03/16/2016   Neck muscle spasm 03/16/2016   History of back surgery 03/16/2016   Osteoarthritis of both knees 03/16/2016   Large breasts 03/16/2016   Essential hypertension 03/16/2016   Perennial allergic rhinitis with seasonal variation 03/16/2016   History of pneumococcal pneumonia 09/20/2015   History of Hodgkin's lymphoma 07/15/2014   Breast cancer, right (HCC) 06/26/2013    Past Surgical History:  Procedure Laterality Date   ABDOMINAL HYSTERECTOMY     BACK SURGERY     BREAST BIOPSY Right 2011   radiation   BREAST LUMPECTOMY Right 2011   w/ radiation   BREAST SURGERY Right 2011   right breast lumpectomy,L/SN/R for right breast CA    CATARACT EXTRACTION W/PHACO Left 06/10/2018   Procedure: CATARACT EXTRACTION PHACO AND INTRAOCULAR LENS PLACEMENT (IOC) LEFT;  Surgeon: Nevada Crane, MD;  Location: [facility];  Service: Ophthalmology;  Laterality: Left;  Diabetic - oral meds   CATARACT EXTRACTION W/PHACO Right 07/15/2018   Procedure: CATARACT EXTRACTION PHACO AND INTRAOCULAR LENS PLACEMENT (IOC) RIGHT;  Surgeon: Nevada Crane, MD;  Location: [facility];  Service: Ophthalmology;  Laterality: Right;  COLONOSCOPY  2003   COLONOSCOPY N/A 03/09/2022   Procedure: COLONOSCOPY WITH BIOPSY;  Surgeon: Midge Minium, MD;  Location: [facility];  Service: Endoscopy;  Laterality: N/A;   POLYPECTOMY N/A 03/09/2022   Procedure: POLYPECTOMY;  Surgeon: Midge Minium, MD;  Location: [facility];  Service: Endoscopy;  Laterality: N/A;   PORT A CATH REVISION     REPLACEMENT TOTAL KNEE Right 10/03/2019   DR. Odis Luster    WRIST SURGERY  2005    OB History     Gravida  2   Para  2   Term      Preterm      AB      Living  2       SAB      IAB      Ectopic      Multiple      Live Births           Obstetric Comments  1st Menstrual Cycle:  12 1st Pregnancy:  22.          Home Medications    Prior to Admission medications   Medication Sig Start Date End Date Taking? Authorizing Provider  cefdinir (OMNICEF) 300 MG capsule Take 1 capsule (300 mg total) by mouth 2 (two) times daily for 7 days. 10/28/23 11/04/23 Yes Jauna Raczynski, DO  fluconazole (DIFLUCAN) 150 MG tablet Take 1 tablet (150 mg total) by mouth once for 1 dose. 10/28/23 10/28/23 Yes Aryon Nham, DO  acetaminophen (TYLENOL) 650 MG CR tablet Take 1,300 mg by mouth every 8 (eight) hours as needed for pain.    [provider]  amLODipine-valsartan (EXFORGE) 5-160 MG tablet Take 1 tablet by mouth daily. 10/01/23   Alba Cory, MD  atorvastatin (LIPITOR) 20 MG tablet Take 1 tablet (20 mg total) by mouth daily. 10/01/23   Alba Cory, MD  buPROPion (WELLBUTRIN XL) 150 MG 24 hr tablet Take 1 tablet (150 mg total) by mouth daily. 10/01/23   Alba Cory, MD  CALCIUM-MAGNESIUM PO Take 1 tablet by mouth daily.    [provider]  cetirizine (ZYRTEC) 10 MG tablet Take 10 mg by mouth daily.    [provider]  Cholecalciferol (VITAMIN D-3 PO) Take 1 tablet by mouth daily.    [provider]  denosumab (PROLIA) 60 MG/ML SOSY injection Inject 60 mg into the skin every 6 (six) months.    Rosey Bath, MD  hydrocortisone 2.5%-nystatin-zinc oxide 20% 1:1:1 ointment mixture Apply topically 2 (two) times daily as needed. 10/01/23   Alba Cory, MD  meloxicam (MOBIC) 15 MG tablet Take 1 tablet by mouth once daily 10/05/23   Alba Cory, MD  metoprolol succinate (TOPROL-XL) 50 MG 24 hr tablet Take 1 tablet (50 mg total) by mouth daily. Take with or immediately following a meal. 10/01/23   Alba Cory, MD  Multiple Vitamin (MULTIVITAMIN) tablet Take 1 tablet by mouth daily.    [provider]   nystatin (MYCOSTATIN/NYSTOP) powder APPLY  POWDER TOPICALLY TWICE DAILY 04/16/23   Alba Cory, MD  pantoprazole (PROTONIX) 40 MG tablet Take 1 tablet (40 mg total) by mouth daily. 10/01/23   Alba Cory, MD  pregabalin (LYRICA) 50 MG capsule Take 1 capsule (50 mg total) by mouth 3 (three) times daily. Start at night and go up to three times daily as tolerated 06/22/23   Alba Cory, MD  sertraline (ZOLOFT) 100 MG tablet Take 1 tablet (100 mg total) by mouth every morning. 10/01/23  Alba Cory, MD  tolterodine (DETROL LA) 4 MG 24 hr capsule Take 1 capsule (4 mg total) by mouth daily. 05/14/23   MacDiarmid, Lorin Picket, MD  Vibegron (GEMTESA) 75 MG TABS Take 1 tablet (75 mg total) by mouth daily. 05/14/23   Alfredo Martinez, MD    Family History Family History  Problem Relation Age of Onset   Cancer Father    Bone cancer Father    Breast cancer Mother 91   Stroke Mother    Colon cancer Maternal Grandmother    Breast cancer Cousin     Social History Social History   Tobacco Use   Smoking status: Never    Passive exposure: Never   Smokeless tobacco: Never   Tobacco comments:    smoking cessation materials not required  Vaping Use   Vaping status: Never Used  Substance Use Topics   Alcohol use: Yes    Comment: occasionally   Drug use: No     Allergies   Shrimp [shellfish allergy] and Augmentin [amoxicillin-pot clavulanate]   Review of Systems Review of Systems: :negative unless otherwise stated in HPI.      Physical Exam Triage Vital Signs ED Triage Vitals  Encounter Vitals Group     BP 10/28/23 1311 (!) 156/88     Systolic BP Percentile --      Diastolic BP Percentile --      Pulse Rate 10/28/23 1311 84     Resp 10/28/23 1311 14     Temp 10/28/23 1311 98.4 F (36.9 C)     Temp Source 10/28/23 1311 Oral     SpO2 10/28/23 1311 95 %     Weight 10/28/23 1309 237 lb 7 oz (107.7 kg)     Height 10/28/23 1309 5\' 3"  (1.6 m)     Head Circumference --       Peak Flow --      Pain Score 10/28/23 1309 8     Pain Loc --      Pain Education --      Exclude from Growth Chart --    No data found.  Updated Vital Signs BP (!) 156/88 (BP Location: Left Arm)   Pulse 84   Temp 98.4 F (36.9 C) (Oral)   Resp 14   Ht 5\' 3"  (1.6 m)   Wt 107.7 kg   SpO2 95%   BMI 42.06 kg/m   Visual Acuity Right Eye Distance:   Left Eye Distance:   Bilateral Distance:    Right Eye Near:   Left Eye Near:    Bilateral Near:     Physical Exam GEN: well appearing female in no acute distress  HENT: Moist mucous membranes, oropharyngeal erythema, no tonsillar exudates or tonsillar hypertrophy CVS: well perfused  RESP: speaking in full sentences without pause  GU: deferred, patient performed self swab     UC Treatments / Results  Labs (all labs ordered are listed, but only abnormal results are displayed) Labs Reviewed  GROUP A STREP BY PCR - Abnormal; Notable for the following components:      Result Value   Group A Strep by PCR DETECTED (*)    All other components within normal limits  WET PREP, GENITAL - Abnormal; Notable for the following components:   WBC, Wet Prep HPF POC >=10 (*)    All other components within normal limits  SARS CORONAVIRUS 2 BY RT PCR    EKG   Radiology No results found.  Procedures Procedures (including critical care  time)  Medications Ordered in UC Medications - No data to display  Initial Impression / Assessment and Plan / UC Course  I have reviewed the triage vital signs and the nursing notes.  Pertinent labs & imaging results that were available during my care of the patient were reviewed by me and considered in my medical decision making (see chart for details).      Patient is a 77 y.o. female who presents for sore throat for the past 5 days.  On exam, pharyngeal exam is erythematous but no exudates or tonsillar hypertrophy. Strep test is positive. Overall patient is non-toxic-appearing, well-hydrated and  without respiratory distress. Pt is afebrile  here.  Tylenol/Motrin as needed for discomfort.  Continue gargling with warm salt water.  Recommended to avoid anything that irritates herthroat.  Treat with cefdinir twice daily for 7 days.  Coprescribed Diflucan.  Patient having vaginal discharge for about 5 days.  Wet prep showing evidence no yeast vaginitis, trichomonas or bacterial vaginitis.  She has been referred to a gynecologist for possible cystocele.  Her vaginal discharge may be a sequela of this.  Advised patient to follow-up with her primary care doctor to see if they can move her referral to urgent so she can be seen sooner.  Return precautions including abdominal pain, fever, chills, nausea, or vomiting given. Discussed MDM, treatment plan and plan for follow-up with patient who agrees with plan.       Final Clinical Impressions(s) / UC Diagnoses   Final diagnoses:  Strep pharyngitis  Vaginal discharge     Discharge Instructions      Stop by the pharmacy to pick up your prescriptions.  Follow up with your primary care provider as needed.      ED Prescriptions     Medication Sig Dispense Auth. Provider   cefdinir (OMNICEF) 300 MG capsule Take 1 capsule (300 mg total) by mouth 2 (two) times daily for 7 days. 14 capsule Loki Wuthrich, DO   fluconazole (DIFLUCAN) 150 MG tablet Take 1 tablet (150 mg total) by mouth once for 1 dose. 1 tablet Katha Cabal, DO      PDMP not reviewed this encounter.      Katha Cabal, DO 10/28/23 1459

## 2023-10-28 NOTE — ED Triage Notes (Signed)
Patient c/o sore throat for the past 3 days.  Patient vaginal discharge and irritation that started 5 days ago.  Patient denies fevers.

## 2023-10-30 ENCOUNTER — Telehealth: Payer: Self-pay | Admitting: Oncology

## 2023-10-30 NOTE — Telephone Encounter (Signed)
Patient is having knee replacement on 2/20- she is asking if she can r/s her appointments to before. With the injection- I wasn't sure if she can come prior- please advise

## 2023-11-01 NOTE — Telephone Encounter (Signed)
 Ok to move up appt to early Feb, just lab/MD (no prolia since she is having knee surgery)

## 2023-11-08 ENCOUNTER — Other Ambulatory Visit: Payer: Self-pay

## 2023-11-08 NOTE — Progress Notes (Signed)
 Ellouise Console, PA-C [address]  [address]  [place], [place] [address]  Main: [phone]  Fax: [phone]   Gastroenterology Consultation  Referring Provider:     Glenard Mire, MD Primary Care Physician:  Glenard Mire, MD Primary Gastroenterologist:  Ellouise Console, PA-C / Dr. Rogelia Copping   Reason for Consultation:     Fecal incontinence        HPI:   Mary Todd is a 78 y.o. y/o female referred for consultation & management  by Sowles, Krichna, MD. Here to evaluate fecal incontinence.  Current symptoms: Patient states she has intermittent episodes of loose stool with fecal incontinence for 6 to 12 months.  She typically has 1 or 2 formed bowel movements daily.  Approximately once per month she has an episode of loose stool with fecal incontinence.  No warning.  It is not happening every day.  She denies abdominal pain, rectal bleeding, or weight loss.  Recently finished antibiotic for strep throat.  On the antibiotics she had hard stool.  No new medications.  She also reports increased flatulence with malodorous lower intestinal gas.  02/2022 colonoscopy by Dr. Copping: Excellent prep.  2 small (3 mm, 4 mm) tubular adenoma polyps removed from transverse colon.  Pandiverticulosis.  No further colonoscopy due to advanced age.  GERD is controlled on pantoprazole  once daily.  Past Medical History:  Diagnosis Date   Allergy    Arthritis    knees, lower back   Cancer [facility])    Breast cancer   Esophageal reflux    Hypertension 2006   Osteoporosis    Personal history of malignant neoplasm of breast 2011   right breast   Personal history of radiation therapy 2011   RIGHT lumpectomy w/ radiation    Past Surgical History:  Procedure Laterality Date   ABDOMINAL HYSTERECTOMY     BACK SURGERY     BREAST BIOPSY Right 2011   radiation   BREAST LUMPECTOMY Right 2011   w/ radiation   BREAST SURGERY Right 2011   right breast lumpectomy,L/SN/R for right breast CA     CATARACT EXTRACTION W/PHACO Left 06/10/2018   Procedure: CATARACT EXTRACTION PHACO AND INTRAOCULAR LENS PLACEMENT (IOC) LEFT;  Surgeon: Myrna Adine Anes, MD;  Location: [facility];  Service: Ophthalmology;  Laterality: Left;  Diabetic - oral meds   CATARACT EXTRACTION W/PHACO Right 07/15/2018   Procedure: CATARACT EXTRACTION PHACO AND INTRAOCULAR LENS PLACEMENT (IOC) RIGHT;  Surgeon: Myrna Adine Anes, MD;  Location: [facility];  Service: Ophthalmology;  Laterality: Right;   COLONOSCOPY  2003   COLONOSCOPY N/A 03/09/2022   Procedure: COLONOSCOPY WITH BIOPSY;  Surgeon: Copping Rogelia, MD;  Location: [facility];  Service: Endoscopy;  Laterality: N/A;   POLYPECTOMY N/A 03/09/2022   Procedure: POLYPECTOMY;  Surgeon: Copping Rogelia, MD;  Location: [facility];  Service: Endoscopy;  Laterality: N/A;   PORT A CATH REVISION     REPLACEMENT TOTAL KNEE Right 10/03/2019   DR. [facility]    WRIST SURGERY  2005    Prior to Admission medications   Medication Sig Start Date End Date Taking? Authorizing Provider  acetaminophen  (TYLENOL ) 650 MG CR tablet Take 1,300 mg by mouth every 8 (eight) hours as needed for pain.    [provider]  albuterol  (VENTOLIN  HFA) 108 (90 Base) MCG/ACT inhaler INHALE 2 PUFFS BY MOUTH EVERY 4 HOURS AS NEEDED FOR WHEEZING OR SHORTNESS OF BREATH    [provider]  amLODipine -valsartan  (EXFORGE )  5-160 MG tablet Take 1 tablet by mouth daily. 10/01/23   Sowles, Krichna, MD  atorvastatin  (LIPITOR) 20 MG tablet Take 1 tablet (20 mg total) by mouth daily. 10/01/23   Sowles, Krichna, MD  buPROPion  (WELLBUTRIN  XL) 150 MG 24 hr tablet Take 1 tablet (150 mg total) by mouth daily. 10/01/23   Sowles, Krichna, MD  CALCIUM -MAGNESIUM PO Take 1 tablet by mouth daily.    [provider]  cetirizine (ZYRTEC) 10 MG tablet Take 10 mg by mouth daily.    [provider]  Cholecalciferol (VITAMIN D -3 PO) Take 1 tablet by mouth daily.     [provider]  denosumab  (PROLIA ) 60 MG/ML SOSY injection Inject 60 mg into the skin every 6 (six) months.    Corcoran, Melissa C, MD  diazepam (VALIUM) 5 MG tablet TAKE 1 TABLET BY MOUTH 45 MINUTES PRIOR TO PROCEDURE. MAY TAKE ANOTHER 20 MINUTES PRIOR IF NEEDED.    [provider]  meloxicam  (MOBIC ) 15 MG tablet Take 1 tablet by mouth once daily 10/05/23   Sowles, Krichna, MD  metoprolol  succinate (TOPROL -XL) 50 MG 24 hr tablet Take 1 tablet (50 mg total) by mouth daily. Take with or immediately following a meal. 10/01/23   Sowles, Krichna, MD  Multiple Vitamin (MULTIVITAMIN) tablet Take 1 tablet by mouth daily.    [provider]  nystatin  (MYCOSTATIN /NYSTOP ) powder APPLY  POWDER TOPICALLY TWICE DAILY 04/16/23   Sowles, Krichna, MD  pantoprazole  (PROTONIX ) 40 MG tablet Take 1 tablet (40 mg total) by mouth daily. 10/01/23   Sowles, Krichna, MD  sertraline  (ZOLOFT ) 100 MG tablet Take 1 tablet (100 mg total) by mouth every morning. 10/01/23   Sowles, Krichna, MD  tolterodine  (DETROL  LA) 4 MG 24 hr capsule Take 1 capsule (4 mg total) by mouth daily. 05/14/23   Gaston Hamilton, MD  Vibegron  (GEMTESA ) 75 MG TABS Take 1 tablet (75 mg total) by mouth daily. 05/14/23   Gaston Hamilton, MD    Family History  Problem Relation Age of Onset   Cancer Father    Bone cancer Father    Breast cancer Mother 65   Stroke Mother    Colon cancer Maternal Grandmother    Breast cancer Cousin      Social History   Tobacco Use   Smoking status: Never    Passive exposure: Never   Smokeless tobacco: Never   Tobacco comments:    smoking cessation materials not required  Vaping Use   Vaping status: Never Used  Substance Use Topics   Alcohol use: Yes    Comment: occasionally   Drug use: No    Allergies as of 11/09/2023 - Review Complete 11/09/2023  Allergen Reaction Noted   Shrimp [shellfish allergy] Hives 05/30/2018   Augmentin [amoxicillin-pot clavulanate] Nausea Only  08/04/2015    Review of Systems:    All systems reviewed and negative except where noted in HPI.   Physical Exam:  BP 131/84   Pulse 80   Temp 97.9 F (36.6 C)   Ht 5' 3 (1.6 m)   Wt 237 lb 12.8 oz (107.9 kg)   BMI 42.12 kg/m  No LMP recorded. Patient has had a hysterectomy.  General:   Alert,  Well-developed, well-nourished, pleasant and cooperative in NAD Lungs:  Respirations even and unlabored.  Clear throughout to auscultation.   No wheezes, crackles, or rhonchi. No acute distress. Heart:  Regular rate and rhythm; no murmurs, clicks, rubs, or gallops. Abdomen:  Normal bowel sounds.  No bruits.  Soft, and non-distended without masses, hepatosplenomegaly or hernias noted.  No Tenderness.  No guarding or rebound tenderness.    Neurologic:  Alert and oriented x3;  grossly normal neurologically. Psych:  Alert and cooperative. Normal mood and affect.  Imaging Studies: No results found.  Assessment and Plan:   Mary Todd is a 78 y.o. y/o female has been referred for:  1.  Episodes of Fecal incontinence and Loose stool Once per Month  Colonoscopy is up-to-date  Start FiberCon, Take 2 tablets once daily.  If FiberCon does not work, then try OTC Metamucil Gummies.  Discussed pelvic floor exercises to help strengthen pelvic floor muscles.  Recommend drink 64 ounces of water  or fluids daily.  Recommend healthy diet with 30 g of fiber daily with fruits, vegetables, whole grains.  2.  Flatulence, Gas  Try OTC Gas-X to capsules after meals.  Avoid Gas producing foods such as high fructose corn syrup, artificial sweeteners, broccoli, beans, cabbage, boiled eggs, and bananas.  Try low FODMAP diet.  Follow up if symptoms worsen or fail to improve.  Ellouise Console, PA-C

## 2023-11-09 ENCOUNTER — Encounter: Payer: Self-pay | Admitting: Physician Assistant

## 2023-11-09 ENCOUNTER — Ambulatory Visit (INDEPENDENT_AMBULATORY_CARE_PROVIDER_SITE_OTHER): Payer: Medicare Other | Admitting: Physician Assistant

## 2023-11-09 VITALS — BP 131/84 | HR 80 | Temp 97.9°F | Ht 63.0 in | Wt 237.8 lb

## 2023-11-09 DIAGNOSIS — R159 Full incontinence of feces: Secondary | ICD-10-CM

## 2023-11-09 DIAGNOSIS — R143 Flatulence: Secondary | ICD-10-CM | POA: Diagnosis not present

## 2023-11-14 DIAGNOSIS — M1712 Unilateral primary osteoarthritis, left knee: Secondary | ICD-10-CM | POA: Diagnosis not present

## 2023-11-28 DIAGNOSIS — Z0189 Encounter for other specified special examinations: Secondary | ICD-10-CM | POA: Diagnosis not present

## 2023-12-03 ENCOUNTER — Ambulatory Visit: Payer: Medicare Other | Admitting: Physician Assistant

## 2023-12-06 ENCOUNTER — Inpatient Hospital Stay: Payer: Medicare Other | Attending: Oncology

## 2023-12-06 ENCOUNTER — Encounter: Payer: Self-pay | Admitting: Oncology

## 2023-12-06 ENCOUNTER — Inpatient Hospital Stay (HOSPITAL_BASED_OUTPATIENT_CLINIC_OR_DEPARTMENT_OTHER): Payer: Medicare Other | Admitting: Oncology

## 2023-12-06 VITALS — BP 102/84 | HR 85 | Temp 95.7°F | Resp 19 | Wt 237.0 lb

## 2023-12-06 DIAGNOSIS — Z8571 Personal history of Hodgkin lymphoma: Secondary | ICD-10-CM

## 2023-12-06 DIAGNOSIS — C50911 Malignant neoplasm of unspecified site of right female breast: Secondary | ICD-10-CM

## 2023-12-06 DIAGNOSIS — Z800 Family history of malignant neoplasm of digestive organs: Secondary | ICD-10-CM | POA: Diagnosis not present

## 2023-12-06 DIAGNOSIS — M810 Age-related osteoporosis without current pathological fracture: Secondary | ICD-10-CM

## 2023-12-06 DIAGNOSIS — Z923 Personal history of irradiation: Secondary | ICD-10-CM | POA: Diagnosis not present

## 2023-12-06 DIAGNOSIS — Z79811 Long term (current) use of aromatase inhibitors: Secondary | ICD-10-CM | POA: Insufficient documentation

## 2023-12-06 DIAGNOSIS — E041 Nontoxic single thyroid nodule: Secondary | ICD-10-CM

## 2023-12-06 DIAGNOSIS — Z803 Family history of malignant neoplasm of breast: Secondary | ICD-10-CM | POA: Insufficient documentation

## 2023-12-06 DIAGNOSIS — Z170 Estrogen receptor positive status [ER+]: Secondary | ICD-10-CM | POA: Insufficient documentation

## 2023-12-06 DIAGNOSIS — Z1231 Encounter for screening mammogram for malignant neoplasm of breast: Secondary | ICD-10-CM

## 2023-12-06 LAB — CBC WITH DIFFERENTIAL (CANCER CENTER ONLY)
Abs Immature Granulocytes: 0.04 10*3/uL (ref 0.00–0.07)
Basophils Absolute: 0 10*3/uL (ref 0.0–0.1)
Basophils Relative: 0 %
Eosinophils Absolute: 0.3 10*3/uL (ref 0.0–0.5)
Eosinophils Relative: 3 %
HCT: 38.8 % (ref 36.0–46.0)
Hemoglobin: 13 g/dL (ref 12.0–15.0)
Immature Granulocytes: 0 %
Lymphocytes Relative: 26 %
Lymphs Abs: 2.5 10*3/uL (ref 0.7–4.0)
MCH: 29.8 pg (ref 26.0–34.0)
MCHC: 33.5 g/dL (ref 30.0–36.0)
MCV: 89 fL (ref 80.0–100.0)
Monocytes Absolute: 0.8 10*3/uL (ref 0.1–1.0)
Monocytes Relative: 8 %
Neutro Abs: 5.9 10*3/uL (ref 1.7–7.7)
Neutrophils Relative %: 63 %
Platelet Count: 212 10*3/uL (ref 150–400)
RBC: 4.36 MIL/uL (ref 3.87–5.11)
RDW: 14.1 % (ref 11.5–15.5)
WBC Count: 9.6 10*3/uL (ref 4.0–10.5)
nRBC: 0 % (ref 0.0–0.2)

## 2023-12-06 LAB — CMP (CANCER CENTER ONLY)
ALT: 26 U/L (ref 0–44)
AST: 22 U/L (ref 15–41)
Albumin: 4 g/dL (ref 3.5–5.0)
Alkaline Phosphatase: 75 U/L (ref 38–126)
Anion gap: 9 (ref 5–15)
BUN: 13 mg/dL (ref 8–23)
CO2: 24 mmol/L (ref 22–32)
Calcium: 9.1 mg/dL (ref 8.9–10.3)
Chloride: 102 mmol/L (ref 98–111)
Creatinine: 0.74 mg/dL (ref 0.44–1.00)
GFR, Estimated: >60 mL/min (ref >60)
Glucose, Bld: 99 mg/dL (ref 70–99)
Potassium: 4 mmol/L (ref 3.5–5.1)
Sodium: 135 mmol/L (ref 135–145)
Total Bilirubin: 0.8 mg/dL (ref 0.0–1.2)
Total Protein: 7.6 g/dL (ref 6.5–8.1)

## 2023-12-06 LAB — LACTATE DEHYDROGENASE: LDH: 130 U/L (ref 98–192)

## 2023-12-06 NOTE — Assessment & Plan Note (Addendum)
#  History of stage II Hodgkin's lymphoma Patient has been in remission for many years. Labs reviewed and discussed with patient. LDH is normal. Clinically patient is doing well without any constitutional symptoms. Patient elects to continue clinical surveillance

## 2023-12-06 NOTE — Assessment & Plan Note (Addendum)
#  Stage IA right breast cancer Clinically she is doing well except above symptoms Finished 10 years of antiestrogen treatments. Continue annual screening mammogram.- Oct 2025

## 2023-12-06 NOTE — Assessment & Plan Note (Signed)
#  Osteopenia 05/08/2023 , bone density showed stable osteopenia. Continue calcium  and vitamin D  supplementation Continue Prolia  every 6 months- hold off today due to upcoming left knee replacement

## 2023-12-06 NOTE — Progress Notes (Signed)
 Hematology/Oncology Progress note [phone] [phone]    Referring physician: Sowles, Krichna, MD  ASSESSMENT & PLAN:   History of Hodgkin's lymphoma #History of stage II Hodgkin's lymphoma Patient has been in remission for many years. Labs reviewed and discussed with patient. LDH is normal. Clinically patient is doing well without any constitutional symptoms. Patient elects to continue clinical surveillance   Breast cancer, right (HCC) #Stage IA right breast cancer Clinically she is doing well except above symptoms Finished 10 years of antiestrogen treatments. Continue annual screening mammogram.- Oct 2025  Osteoporosis #Osteopenia 05/08/2023 , bone density showed stable osteopenia. Continue calcium  and vitamin D  supplementation Continue Prolia  every 6 months- hold off today due to upcoming left knee replacement   Orders Placed This Encounter  Procedures   MM 3D SCREENING MAMMOGRAM BILATERAL BREAST    Standing Status:   Future    Expected Date:   08/04/2024    Expiration Date:   12/05/2024    Reason for Exam (SYMPTOM  OR DIAGNOSIS REQUIRED):   annual screening mamogram    Preferred imaging location?:   [facility]   CBC with Differential (Cancer Center Only)    Standing Status:   Future    Expected Date:   12/05/2024    Expiration Date:   12/05/2024   CMP (Cancer Center only)    Standing Status:   Future    Expected Date:   12/05/2024    Expiration Date:   12/05/2024   Lactate dehydrogenase    Standing Status:   Future    Expected Date:   12/05/2024    Expiration Date:   12/05/2024   Basic Metabolic Panel - Cancer Center Only    Standing Status:   Future    Expected Date:   06/04/2024    Expiration Date:   12/05/2024   Follow up  6 months lab - BMP + prolia   1 year lab MD same blood work + prolia   All questions were answered. The patient knows to call the clinic with any problems, questions or concerns.  Zelphia Cap, MD, PhD [facility] Hematology  Oncology 12/06/2023    Chief Complaint: Mary Todd is a 78 y.o. female with stage II Hodgkin's disease (2004) and stage IA right breast cancer (2011)  PERTINENT ONCOLOGY HISTORY Mary Todd is a 78 y.o.afemale who has above oncology history reviewed by me today presented for follow up visit for management of stage II Hodgkin's disease (2004) and stage IA right breast cancer (2011)   Patient previously followed up by Dr.Corcoran, patient switched care to me on 04/14/2022 Extensive medical record review was performed by me  stage II Hodgkin's disease (2004) and stage IA right breast cancer (2011).   2004 stage IIA Hodgkin's disease after presenting with low counts and joint pain.  Abdomen and pelvic CT scan as well as PET scan revealed a pelvic mass and retroperitoneal lymphadenopathy.  Biopsy confirmed Hodgkin's disease.     She received ABVD times 5 through cycle 3-A and then AVD without the Bleomycin times 7 subsequent cycles.  Treatment completed in 12/2003.     CT and PET scans were negative in 05/2006 and in 06/2006.   07/21/2010. She underwent right breast lumpectomy and sentinel lymph node biopsy Pathology revealed a 0.5 cm grade II invasive ductal carcinoma.  Two sentinel lymph nodes were negative.  Tumor was ER + (>90%), PR + (40%), and Her2/neu 1+.  Pathologic stage was [id number].  She received Mammosite radiation (completed 08/22/2010).  Oncotype DX testing revealed a low risk lesion.  BRCA1/2 testing was negative.  She received 5 years of an aromatase inhibitor.   Breast cancer index (BCI) testing on 07/23/2015 revealed a 6.3% (CI: 2.9%-9.6%) risk of late recurrent disease (years 5-10) after 5 years of hormonal therapy and a high likelihood of benefit from continued hormonal therapy (30% reduction).  Decision was made to continue Arimidex  for 5 more years.   Bilateral screening mammogram on 07/13/2020 revealed no evidence of malignancy.    Thyroid   ultrasound  on 04/13/2007 revealed a 2.3 cm solid and cystic nodule within the left thyroid  gland. There was also a small nodule within the right thyroid  gland that did not meet size criteria for measurement. There was a single benign appearing lymph node within the right neck.  FNA in 06/2016 was  indeterminate and in 08/2016 was benign.     Bone density study on 09/05/2011 revealed osteopenia with a  T-score of -1.7 in L1-L4.  Bone density study on 11/14/2012 revealed osteopenia with a  T-score of -1.5 in L1-L4.  Bone density study on 12/14/2016 revealed osteoporosis in L2-3 with a T-score of -3.1.  Left femur normal with T-score of -0.8.  Bone density on 05/05/2019 revealed osteopenia with a T-score of -2.4 in the AP spine L2-L3.  She began Prolia  on 01/24/2017, every 6 months 04/21/2021, CT scan showed no pathologically enlarged abnormal or pelvic lymph nodes and no evidence of metastatic disease within the chest abdomen and pelvis.  Left-sided thyroid  nodule 1.6 cm.  Mild hepatomegaly with diffuse hepatic steatosis.  Left-sided colonic diverticulosis without findings of acute diverticulitis.  She has finished 10 years of Arimidex  and is stopped in March 2022. Night sweats fatigue improved after started on Zoloft   INTERVAL HISTORY Mary Todd is a 78 y.o. female who has above history reviewed by me today for follow-up of history of Hodgkin's lymphoma, history of breast cancer. Today patient reports feeling well.  She has no new breast concerns.  .  Review of Systems  Constitutional:  Positive for fatigue. Negative for appetite change, chills and fever.  HENT:   Negative for hearing loss and voice change.   Eyes:  Negative for eye problems.  Respiratory:  Negative for chest tightness and cough.   Cardiovascular:  Negative for chest pain.  Gastrointestinal:  Negative for abdominal distention, abdominal pain and blood in stool.  Endocrine: Negative for hot flashes.   Genitourinary:  Negative for difficulty urinating and frequency.   Musculoskeletal:  Positive for arthralgias.  Skin:  Negative for itching and rash.  Neurological:  Negative for extremity weakness.  Hematological:  Negative for adenopathy.  Psychiatric/Behavioral:  Negative for confusion.      Past Medical History:  Diagnosis Date   Allergy    Arthritis    knees, lower back   Cancer [facility])    Breast cancer   Esophageal reflux    Hypertension 2006   Osteoporosis    Personal history of malignant neoplasm of breast 2011   right breast   Personal history of radiation therapy 2011   RIGHT lumpectomy w/ radiation    Past Surgical History:  Procedure Laterality Date   ABDOMINAL HYSTERECTOMY     BACK SURGERY     BREAST BIOPSY Right 2011   radiation   BREAST LUMPECTOMY Right 2011   w/ radiation   BREAST SURGERY Right 2011   right breast lumpectomy,L/SN/R for right breast CA    CATARACT EXTRACTION [facility] Left 06/10/2018  Procedure: CATARACT EXTRACTION PHACO AND INTRAOCULAR LENS PLACEMENT (IOC) LEFT;  Surgeon: Myrna Adine Anes, MD;  Location: [facility];  Service: Ophthalmology;  Laterality: Left;  Diabetic - oral meds   CATARACT EXTRACTION W/PHACO Right 07/15/2018   Procedure: CATARACT EXTRACTION PHACO AND INTRAOCULAR LENS PLACEMENT (IOC) RIGHT;  Surgeon: Myrna Adine Anes, MD;  Location: [facility];  Service: Ophthalmology;  Laterality: Right;   COLONOSCOPY  2003   COLONOSCOPY N/A 03/09/2022   Procedure: COLONOSCOPY WITH BIOPSY;  Surgeon: Jinny Carmine, MD;  Location: [facility];  Service: Endoscopy;  Laterality: N/A;   POLYPECTOMY N/A 03/09/2022   Procedure: POLYPECTOMY;  Surgeon: Jinny Carmine, MD;  Location: [facility];  Service: Endoscopy;  Laterality: N/A;   PORT A CATH REVISION     REPLACEMENT TOTAL KNEE Right 10/03/2019   DR. Le[facility]    WRIST SURGERY  2005    Family History  Problem Relation Age of Onset   Cancer Father    Bone  cancer Father    Breast cancer Mother 80   Stroke Mother    Colon cancer Maternal Grandmother    Breast cancer Cousin     Social History:  reports that she has never smoked. She has never been exposed to tobacco smoke. She has never used smokeless tobacco. She reports current alcohol use. She reports that she does not use drugs.The patient is alone today.  Allergies:  Allergies  Allergen Reactions   Shrimp [Shellfish Allergy] Hives   Augmentin [Amoxicillin-Pot Clavulanate] Nausea Only    Current Medications: Current Outpatient Medications  Medication Sig Dispense Refill   acetaminophen  (TYLENOL ) 650 MG CR tablet Take 1,300 mg by mouth every 8 (eight) hours as needed for pain.     albuterol  (VENTOLIN  HFA) 108 (90 Base) MCG/ACT inhaler INHALE 2 PUFFS BY MOUTH EVERY 4 HOURS AS NEEDED FOR WHEEZING OR SHORTNESS OF BREATH     amLODipine -valsartan  (EXFORGE ) 5-160 MG tablet Take 1 tablet by mouth daily. 90 tablet 1   atorvastatin  (LIPITOR) 20 MG tablet Take 1 tablet (20 mg total) by mouth daily. 90 tablet 1   buPROPion  (WELLBUTRIN  XL) 150 MG 24 hr tablet Take 1 tablet (150 mg total) by mouth daily. 90 tablet 1   CALCIUM -MAGNESIUM PO Take 1 tablet by mouth daily.     cetirizine (ZYRTEC) 10 MG tablet Take 10 mg by mouth daily.     Cholecalciferol (VITAMIN D -3 PO) Take 1 tablet by mouth daily.     denosumab  (PROLIA ) 60 MG/ML SOSY injection Inject 60 mg into the skin every 6 (six) months.     diazepam (VALIUM) 5 MG tablet TAKE 1 TABLET BY MOUTH 45 MINUTES PRIOR TO PROCEDURE. MAY TAKE ANOTHER 20 MINUTES PRIOR IF NEEDED.     meloxicam  (MOBIC ) 15 MG tablet Take 1 tablet by mouth once daily 90 tablet 0   metoprolol  succinate (TOPROL -XL) 50 MG 24 hr tablet Take 1 tablet (50 mg total) by mouth daily. Take with or immediately following a meal. 90 tablet 1   Multiple Vitamin (MULTIVITAMIN) tablet Take 1 tablet by mouth daily.     nystatin  (MYCOSTATIN /NYSTOP ) powder APPLY  POWDER TOPICALLY TWICE DAILY 60  g 0   pantoprazole  (PROTONIX ) 40 MG tablet Take 1 tablet (40 mg total) by mouth daily. 90 tablet 1   sertraline  (ZOLOFT ) 100 MG tablet Take 1 tablet (100 mg total) by mouth every morning. 90 tablet 1   tolterodine  (DETROL  LA) 4 MG 24 hr capsule Take 1 capsule (4 mg total)  by mouth daily. 90 capsule 3   No current facility-administered medications for this visit.     Performance status (ECOG): 0  Vitals Blood pressure 102/84, pulse 85, temperature (!) 95.7 F (35.4 C), temperature source Tympanic, resp. rate 19, weight 237 lb (107.5 kg), SpO2 95%.  Physical Exam Constitutional:      General: She is not in acute distress.    Appearance: She is obese. She is not diaphoretic.  HENT:     Head: Normocephalic and atraumatic.  Eyes:     General: No scleral icterus. Cardiovascular:     Rate and Rhythm: Normal rate and regular rhythm.     Heart sounds: No murmur heard. Pulmonary:     Effort: Pulmonary effort is normal. No respiratory distress.  Abdominal:     General: There is no distension.     Palpations: Abdomen is soft.     Tenderness: There is no abdominal tenderness.  Musculoskeletal:        General: Normal range of motion.     Cervical back: Normal range of motion and neck supple.  Skin:    General: Skin is warm and dry.     Findings: No erythema.  Neurological:     Mental Status: She is alert and oriented to person, place, and time. Mental status is at baseline.     Motor: No abnormal muscle tone.  Psychiatric:        Mood and Affect: Mood and affect normal.   Patient declines breast examination.   Laboratory findings    Latest Ref Rng & Units 12/06/2023    2:02 PM 12/28/2022    9:27 AM 08/29/2022   11:57 AM  CBC  WBC 4.0 - 10.5 K/uL 9.6  7.4  6.1   Hemoglobin 12.0 - 15.0 g/dL 86.9  87.4  86.6   Hematocrit 36.0 - 46.0 % 38.8  37.4  39.2   Platelets 150 - 400 K/uL 212  215  222         Latest Ref Rng & Units 12/06/2023    2:02 PM 07/03/2023    9:01 AM 12/28/2022     9:27 AM  CMP  Glucose 70 - 99 mg/dL 99  888  899   BUN 8 - 23 mg/dL 13  16  17    Creatinine 0.44 - 1.00 mg/dL 9.25  9.20  9.29   Sodium 135 - 145 mmol/L 135  133  136   Potassium 3.5 - 5.1 mmol/L 4.0  3.8  4.2   Chloride 98 - 111 mmol/L 102  99  100   CO2 22 - 32 mmol/L 24  25  27    Calcium  8.9 - 10.3 mg/dL 9.1  9.2  9.2   Total Protein 6.5 - 8.1 g/dL 7.6   7.5   Total Bilirubin 0.0 - 1.2 mg/dL 0.8   0.5   Alkaline Phos 38 - 126 U/L 75   89   AST 15 - 41 U/L 22   25   ALT 0 - 44 U/L 26   36

## 2023-12-07 LAB — CANCER ANTIGEN 27.29: CA 27.29: <9 U/mL (ref 0.0–38.6)

## 2023-12-20 DIAGNOSIS — I10 Essential (primary) hypertension: Secondary | ICD-10-CM | POA: Diagnosis not present

## 2023-12-20 DIAGNOSIS — Z853 Personal history of malignant neoplasm of breast: Secondary | ICD-10-CM | POA: Diagnosis not present

## 2023-12-20 DIAGNOSIS — Z7982 Long term (current) use of aspirin: Secondary | ICD-10-CM | POA: Diagnosis not present

## 2023-12-20 DIAGNOSIS — Z8571 Personal history of Hodgkin lymphoma: Secondary | ICD-10-CM | POA: Diagnosis not present

## 2023-12-20 DIAGNOSIS — G8918 Other acute postprocedural pain: Secondary | ICD-10-CM | POA: Diagnosis not present

## 2023-12-20 DIAGNOSIS — Z6841 Body Mass Index (BMI) 40.0 and over, adult: Secondary | ICD-10-CM | POA: Diagnosis not present

## 2023-12-20 DIAGNOSIS — Z96651 Presence of right artificial knee joint: Secondary | ICD-10-CM | POA: Diagnosis not present

## 2023-12-20 DIAGNOSIS — M1712 Unilateral primary osteoarthritis, left knee: Secondary | ICD-10-CM | POA: Diagnosis not present

## 2023-12-20 HISTORY — PX: TOTAL KNEE ARTHROPLASTY: SHX125

## 2023-12-21 DIAGNOSIS — Z6841 Body Mass Index (BMI) 40.0 and over, adult: Secondary | ICD-10-CM | POA: Diagnosis not present

## 2023-12-21 DIAGNOSIS — I10 Essential (primary) hypertension: Secondary | ICD-10-CM | POA: Diagnosis not present

## 2023-12-21 DIAGNOSIS — Z853 Personal history of malignant neoplasm of breast: Secondary | ICD-10-CM | POA: Diagnosis not present

## 2023-12-21 DIAGNOSIS — M1712 Unilateral primary osteoarthritis, left knee: Secondary | ICD-10-CM | POA: Diagnosis not present

## 2023-12-21 DIAGNOSIS — Z8571 Personal history of Hodgkin lymphoma: Secondary | ICD-10-CM | POA: Diagnosis not present

## 2023-12-25 ENCOUNTER — Other Ambulatory Visit: Payer: Self-pay | Admitting: Orthopedic Surgery

## 2023-12-25 ENCOUNTER — Ambulatory Visit
Admission: RE | Admit: 2023-12-25 | Discharge: 2023-12-25 | Disposition: A | Discharge status: Home / Self Care | Payer: Medicare Other | Source: Ambulatory Visit | Attending: Orthopedic Surgery | Admitting: Orthopedic Surgery

## 2023-12-25 ENCOUNTER — Ambulatory Visit: Admission: RE | Admit: 2023-12-25 | Payer: Medicare Other | Source: Ambulatory Visit

## 2023-12-25 DIAGNOSIS — R609 Edema, unspecified: Secondary | ICD-10-CM

## 2023-12-25 DIAGNOSIS — M7989 Other specified soft tissue disorders: Secondary | ICD-10-CM | POA: Diagnosis not present

## 2023-12-25 DIAGNOSIS — M1712 Unilateral primary osteoarthritis, left knee: Secondary | ICD-10-CM

## 2023-12-27 DIAGNOSIS — Z96652 Presence of left artificial knee joint: Secondary | ICD-10-CM | POA: Diagnosis not present

## 2023-12-28 ENCOUNTER — Other Ambulatory Visit: Payer: Medicare Other

## 2023-12-28 ENCOUNTER — Ambulatory Visit: Payer: Medicare Other | Admitting: Oncology

## 2023-12-28 ENCOUNTER — Ambulatory Visit: Payer: Medicare Other

## 2023-12-31 DIAGNOSIS — Z96652 Presence of left artificial knee joint: Secondary | ICD-10-CM | POA: Diagnosis not present

## 2024-01-07 ENCOUNTER — Other Ambulatory Visit: Payer: Self-pay | Admitting: Family Medicine

## 2024-01-07 DIAGNOSIS — M170 Bilateral primary osteoarthritis of knee: Secondary | ICD-10-CM

## 2024-01-07 NOTE — Telephone Encounter (Signed)
 Has an appt on 01/2024

## 2024-01-08 DIAGNOSIS — Z96652 Presence of left artificial knee joint: Secondary | ICD-10-CM | POA: Diagnosis not present

## 2024-01-10 DIAGNOSIS — M1712 Unilateral primary osteoarthritis, left knee: Secondary | ICD-10-CM | POA: Diagnosis not present

## 2024-01-10 DIAGNOSIS — Z96652 Presence of left artificial knee joint: Secondary | ICD-10-CM | POA: Diagnosis not present

## 2024-01-15 DIAGNOSIS — Z96652 Presence of left artificial knee joint: Secondary | ICD-10-CM | POA: Diagnosis not present

## 2024-01-17 DIAGNOSIS — Z96652 Presence of left artificial knee joint: Secondary | ICD-10-CM | POA: Diagnosis not present

## 2024-01-30 DIAGNOSIS — Z96652 Presence of left artificial knee joint: Secondary | ICD-10-CM | POA: Diagnosis not present

## 2024-01-31 ENCOUNTER — Ambulatory Visit (INDEPENDENT_AMBULATORY_CARE_PROVIDER_SITE_OTHER): Payer: Self-pay | Admitting: Family Medicine

## 2024-01-31 ENCOUNTER — Encounter: Payer: Self-pay | Admitting: Family Medicine

## 2024-01-31 VITALS — BP 126/80 | HR 83 | Resp 16 | Ht 63.0 in | Wt 236.0 lb

## 2024-01-31 DIAGNOSIS — R739 Hyperglycemia, unspecified: Secondary | ICD-10-CM | POA: Diagnosis not present

## 2024-01-31 DIAGNOSIS — G3789 Other specified demyelinating diseases of central nervous system: Secondary | ICD-10-CM | POA: Diagnosis not present

## 2024-01-31 DIAGNOSIS — C8190 Hodgkin lymphoma, unspecified, unspecified site: Secondary | ICD-10-CM | POA: Diagnosis not present

## 2024-01-31 DIAGNOSIS — I6782 Cerebral ischemia: Secondary | ICD-10-CM

## 2024-01-31 DIAGNOSIS — Z9181 History of falling: Secondary | ICD-10-CM

## 2024-01-31 DIAGNOSIS — K219 Gastro-esophageal reflux disease without esophagitis: Secondary | ICD-10-CM

## 2024-01-31 DIAGNOSIS — M810 Age-related osteoporosis without current pathological fracture: Secondary | ICD-10-CM

## 2024-01-31 DIAGNOSIS — I700 Atherosclerosis of aorta: Secondary | ICD-10-CM | POA: Diagnosis not present

## 2024-01-31 DIAGNOSIS — R7303 Prediabetes: Secondary | ICD-10-CM | POA: Diagnosis not present

## 2024-01-31 DIAGNOSIS — I10 Essential (primary) hypertension: Secondary | ICD-10-CM

## 2024-01-31 DIAGNOSIS — R7989 Other specified abnormal findings of blood chemistry: Secondary | ICD-10-CM

## 2024-01-31 NOTE — Progress Notes (Signed)
 Name: Mary Todd   MRN: [id number]    DOB: 10-20-1946   Date:01/31/2024       Progress Note  Subjective  Chief Complaint  Chief Complaint  Patient presents with   Medical Management of Chronic Issues   HPI   Pre-diabetes: she denies polyphagia, polydipsia or polyuria, last A1C has been elevated, we will recheck level   Syncopal Episode: two episodes in 2022, seen by cardiologist and work up negative - Dr. Myriam Forehand thought likely vasovagal episode. She also saw Dr. Sherryll Burger - neurologist and worked up essentially normal , she had a sleep study , per [facility] chart mild OSA and may consider auto Pap 5-20 cm H2O but she never started therapy . No other episodes, she was released from Dr. Sherryll Burger   DDD cervical spine/Thoracic back pain/lumbar pain  she was under the care of pain clinic in [place], history of low back surgery. She continues to have back pain    IMPRESSION: 1. L4-5: Chronic facet arthropathy with 11 mm of anterolisthesis. This has not grossly worsened since radiography of 2018. Moderate stenosis of the central canal. Stenosis of the lateral recesses and neural foramina that could cause neural compression on either or both sides. 2. L5-S1: Previous posterior decompression and fusion with solid union. Wide patency of the canal and foramina. Possible arachnoiditis pattern of the nerve roots.   Urge incontinence: .waiting to see uro-gynecologist at [facility] for further evaluation and treatment    HTN: she is taking  Exforge 5/160 and also metoprolol, tolerating medication well, no chest pain , dizziness SOB or palpitation. BP is at goal today.    GERD: She is back on PPI daily , unable to tolerate skipping doses . Continue medication   History of bilateral knee replacement : she had TKR right knee on Dec 4 th 2020 by Dr. Odis Luster . Had left knee replacement 12/2023 and is doing well, she still getting PT and cleared to start walking now    History Right Breast Cancer/hodgkins  lymphoma : under the care of Dr. Cathie Hoops  she has been released from her surgeon. Also diagnosed with hodgkin's lymphoma in 2004, breast cancer diagnosed in 2011  she stopped Arimidex Feb 2021. Last CA 27.29 stable in Feb 2025    Atherosclerosis aorta/Small vessel disease  : on recent CT abdomen, last LDLwas at goal down from 100 to 55 on current statin therapy  . MRI brain showed white matter ischemic brain disease ( small vessel disease ) Recheck labs today    Fatty liver and hepatomegaly: seen by GI and was advised to try losing weight.  She is eating smaller portions avoiding fried food    Osteoporosis: diagnosed in 2018 with AP spine score of -3.1 . She gets Prolia at the cancer center and she is tolerating medications well. Compliant, last bone density was done in July 2024 and showed improvement of bone mass of spine - however it could be false reading due to DDD spine . She is under the care of Dr. Cathie Hoops and is going to have another infusion soon. We will check vitamin D today    Morbid obesity: BMI is over 40 now, Weight Watchers did not work for her . She is going to start walking again, cutting down on portion size. She has silver sneakers.   Dysthymia: she took Citalopram for years and weaned self off in 2022. She tried Wellbutrin and it improved her fatigue but we stopped due to syncopal episode and possibility  of seizures, but she has be cleared by Dr. Sherryll Burger and is back on medication. She is now on zoloft 100 mg and Wellbutrin and seems to be working well for her . No side effects of medications  Patient Active Problem List   Diagnosis Date Noted   Chronic midline low back pain with sciatica 10/01/2023   Hodgkin's disease, stage IIA (HCC) 10/01/2023   Ischemic demyelination of brain (HCC) 10/01/2023   Atherosclerosis of aorta (HCC) 10/01/2023   Polyp of transverse colon    Total knee replacement status, right 10/03/2019   Anterolisthesis 08/06/2017   DDD (degenerative disc disease),  lumbosacral 08/06/2017   Chondromalacia patellae 07/03/2017   Osteoporosis 12/14/2016   Morbid obesity (HCC) 07/17/2016   Prolapsed cervical intervertebral disc 06/06/2016   Spinal stenosis in cervical region 05/22/2016   Thyroid nodule 05/22/2016   GERD without esophagitis 03/16/2016   Neck muscle spasm 03/16/2016   History of back surgery 03/16/2016   Osteoarthritis of both knees 03/16/2016   Large breasts 03/16/2016   Essential hypertension 03/16/2016   Perennial allergic rhinitis with seasonal variation 03/16/2016   History of pneumococcal pneumonia 09/20/2015   History of Hodgkin's lymphoma 07/15/2014   Breast cancer, right (HCC) 06/26/2013    Past Surgical History:  Procedure Laterality Date   ABDOMINAL HYSTERECTOMY     BACK SURGERY     BREAST BIOPSY Right 2011   radiation   BREAST LUMPECTOMY Right 2011   w/ radiation   BREAST SURGERY Right 2011   right breast lumpectomy,L/SN/R for right breast CA    CATARACT EXTRACTION W/PHACO Left 06/10/2018   Procedure: CATARACT EXTRACTION PHACO AND INTRAOCULAR LENS PLACEMENT (IOC) LEFT;  Surgeon: Nevada Crane, MD;  Location: [facility];  Service: Ophthalmology;  Laterality: Left;  Diabetic - oral meds   CATARACT EXTRACTION W/PHACO Right 07/15/2018   Procedure: CATARACT EXTRACTION PHACO AND INTRAOCULAR LENS PLACEMENT (IOC) RIGHT;  Surgeon: Nevada Crane, MD;  Location: [facility];  Service: Ophthalmology;  Laterality: Right;   COLONOSCOPY  2003   COLONOSCOPY N/A 03/09/2022   Procedure: COLONOSCOPY WITH BIOPSY;  Surgeon: Midge Minium, MD;  Location: [facility];  Service: Endoscopy;  Laterality: N/A;   POLYPECTOMY N/A 03/09/2022   Procedure: POLYPECTOMY;  Surgeon: Midge Minium, MD;  Location: [facility];  Service: Endoscopy;  Laterality: N/A;   PORT A CATH REVISION     REPLACEMENT TOTAL KNEE Right 10/03/2019   DR. Odis Luster    WRIST SURGERY  2005    Family History  Problem Relation Age of  Onset   Cancer Father    Bone cancer Father    Breast cancer Mother 49   Stroke Mother    Colon cancer Maternal Grandmother    Breast cancer Cousin     Social History   Tobacco Use   Smoking status: Never    Passive exposure: Never   Smokeless tobacco: Never   Tobacco comments:    smoking cessation materials not required  Substance Use Topics   Alcohol use: Yes    Comment: occasionally     Current Outpatient Medications:    acetaminophen (TYLENOL) 650 MG CR tablet, Take 1,300 mg by mouth every 8 (eight) hours as needed for pain., Disp: , Rfl:    albuterol (VENTOLIN HFA) 108 (90 Base) MCG/ACT inhaler, INHALE 2 PUFFS BY MOUTH EVERY 4 HOURS AS NEEDED FOR WHEEZING OR SHORTNESS OF BREATH, Disp: , Rfl:    amLODipine-valsartan (EXFORGE) 5-160 MG tablet, Take 1 tablet by mouth  daily., Disp: 90 tablet, Rfl: 1   atorvastatin (LIPITOR) 20 MG tablet, Take 1 tablet (20 mg total) by mouth daily., Disp: 90 tablet, Rfl: 1   buPROPion (WELLBUTRIN XL) 150 MG 24 hr tablet, Take 1 tablet (150 mg total) by mouth daily., Disp: 90 tablet, Rfl: 1   CALCIUM-MAGNESIUM PO, Take 1 tablet by mouth daily., Disp: , Rfl:    cetirizine (ZYRTEC) 10 MG tablet, Take 10 mg by mouth daily., Disp: , Rfl:    Cholecalciferol (VITAMIN D-3 PO), Take 1 tablet by mouth daily., Disp: , Rfl:    denosumab (PROLIA) 60 MG/ML SOSY injection, Inject 60 mg into the skin every 6 (six) months., Disp: , Rfl:    diazepam (VALIUM) 5 MG tablet, TAKE 1 TABLET BY MOUTH 45 MINUTES PRIOR TO PROCEDURE. MAY TAKE ANOTHER 20 MINUTES PRIOR IF NEEDED., Disp: , Rfl:    meloxicam (MOBIC) 15 MG tablet, Take 1 tablet by mouth once daily, Disp: 90 tablet, Rfl: 0   metoprolol succinate (TOPROL-XL) 50 MG 24 hr tablet, Take 1 tablet (50 mg total) by mouth daily. Take with or immediately following a meal., Disp: 90 tablet, Rfl: 1   Multiple Vitamin (MULTIVITAMIN) tablet, Take 1 tablet by mouth daily., Disp: , Rfl:    nystatin (MYCOSTATIN/NYSTOP) powder,  APPLY  POWDER TOPICALLY TWICE DAILY, Disp: 60 g, Rfl: 0   pantoprazole (PROTONIX) 40 MG tablet, Take 1 tablet (40 mg total) by mouth daily., Disp: 90 tablet, Rfl: 1   sertraline (ZOLOFT) 100 MG tablet, Take 1 tablet (100 mg total) by mouth every morning., Disp: 90 tablet, Rfl: 1   tolterodine (DETROL LA) 4 MG 24 hr capsule, Take 1 capsule (4 mg total) by mouth daily., Disp: 90 capsule, Rfl: 3  Allergies  Allergen Reactions   Shrimp [Shellfish Allergy] Hives   Augmentin [Amoxicillin-Pot Clavulanate] Nausea Only    I personally reviewed active problem list, medication list, allergies with the patient/caregiver today.   ROS  Ten systems reviewed and is negative except as mentioned in HPI    Objective Physical Exam Constitutional: Patient appears well-developed and well-nourished. Obese  No distress.  HEENT: head atraumatic, normocephalic, pupils equal and reactive to light, neck supple Cardiovascular: Normal rate, regular rhythm and normal heart sounds.  No murmur heard. No BLE edema. Pulmonary/Chest: Effort normal and breath sounds normal. No respiratory distress. Abdominal: Soft.  There is no tenderness. Muscular skeletal: got up slowly, mild antalgic gait but full rom of both knees  Psychiatric: Patient has a normal mood and affect. behavior is normal. Judgment and thought content normal.   Vitals:   01/31/24 1039  BP: 126/80  Pulse: 83  Resp: 16  SpO2: 97%  Weight: 236 lb (107 kg)  Height: 5\' 3"  (1.6 m)    Body mass index is 41.81 kg/m.  Recent Results (from the past 2160 hours)  Cancer antigen 27.29     Status: None   Collection Time: 12/06/23  2:02 PM  Result Value Ref Range   CA 27.29 <9.0 0.0 - 38.6 U/mL    Comment: (NOTE) Siemens Centaur Immunochemiluminometric Methodology (ICMA) Values obtained with different assay methods or kits cannot be used interchangeably. Results cannot be interpreted as absolute evidence of the presence or absence of malignant  disease. Performed At: [facility] [address] [place], [place] [id number] Jolene Schimke MD UV:[id number]   Lactate dehydrogenase     Status: None   Collection Time: 12/06/23  2:02 PM  Result Value Ref Range   LDH  130 98 - 192 U/L    Comment: Performed at [facility], [address] [place], [place] [address]  CMP (Cancer Center only)     Status: None   Collection Time: 12/06/23  2:02 PM  Result Value Ref Range   Sodium 135 135 - 145 mmol/L   Potassium 4.0 3.5 - 5.1 mmol/L   Chloride 102 98 - 111 mmol/L   CO2 24 22 - 32 mmol/L   Glucose, Bld 99 70 - 99 mg/dL    Comment: Glucose reference range applies only to samples taken after fasting for at least 8 hours.   BUN 13 8 - 23 mg/dL   Creatinine 2.44 0.10 - 1.00 mg/dL   Calcium 9.1 8.9 - 27.2 mg/dL   Total Protein 7.6 6.5 - 8.1 g/dL   Albumin 4.0 3.5 - 5.0 g/dL   AST 22 15 - 41 U/L   ALT 26 0 - 44 U/L   Alkaline Phosphatase 75 38 - 126 U/L   Total Bilirubin 0.8 0.0 - 1.2 mg/dL   GFR, Estimated >53 >66 mL/min    Comment: (NOTE) Calculated using the CKD-EPI Creatinine Equation (2021)    Anion gap 9 5 - 15    Comment: Performed at [facility], [address] [place], [place] [address]  CBC with Differential (Cancer Center Only)     Status: None   Collection Time: 12/06/23  2:02 PM  Result Value Ref Range   WBC Count 9.6 4.0 - 10.5 K/uL   RBC 4.36 3.87 - 5.11 MIL/uL   Hemoglobin 13.0 12.0 - 15.0 g/dL   HCT 74.2 59.5 - 63.8 %   MCV 89.0 80.0 - 100.0 fL   MCH 29.8 26.0 - 34.0 pg   MCHC 33.5 30.0 - 36.0 g/dL   RDW 75.6 43.3 - 29.5 %   Platelet Count 212 150 - 400 K/uL   nRBC 0.0 0.0 - 0.2 %   Neutrophils Relative % 63 %   Neutro Abs 5.9 1.7 - 7.7 K/uL   Lymphocytes Relative 26 %   Lymphs Abs 2.5 0.7 - 4.0 K/uL   Monocytes Relative 8 %   Monocytes Absolute 0.8 0.1 - 1.0 K/uL   Eosinophils Relative 3 %   Eosinophils Absolute 0.3 0.0 - 0.5 K/uL   Basophils Relative 0 %   Basophils Absolute 0.0  0.0 - 0.1 K/uL   Immature Granulocytes 0 %   Abs Immature Granulocytes 0.04 0.00 - 0.07 K/uL    Comment: Performed at [facility], [address] [place], [place] [address]    Diabetic Foot Exam:     PHQ2/9:    01/31/2024   10:37 AM 10/01/2023   11:20 AM 06/22/2023   10:08 AM 06/21/2023    3:35 PM 03/21/2023   10:22 AM  Depression screen PHQ 2/9  Decreased Interest 0 0 0 0 0  Down, Depressed, Hopeless 0 0 0 0 0  PHQ - 2 Score 0 0 0 0 0  Altered sleeping 0 0 0  0  Tired, decreased energy 0 0 0  0  Change in appetite 0 0 0  0  Feeling bad or failure about yourself  0 0 0  0  Trouble concentrating 0 0 0  0  Moving slowly or fidgety/restless 0 0 0  0  Suicidal thoughts 0 0 0  0  PHQ-9 Score 0 0 0  0  Difficult doing work/chores Not difficult at all Not difficult at all Not difficult  at all      phq 9 is negative  Fall Risk:    01/31/2024   10:37 AM 10/01/2023   11:20 AM 06/22/2023   10:07 AM 06/21/2023    3:31 PM 06/21/2023    3:30 PM  Fall Risk   Falls in the past year? 1 1 0 0   Number falls in past yr: 0 0 0 0   Injury with Fall? 0 0 0 0   Risk for fall due to : Impaired balance/gait Impaired balance/gait No Fall Risks No Fall Risks No Fall Risks  Follow up Education provided;Falls evaluation completed;Falls prevention discussed Falls prevention discussed;Education provided;Falls evaluation completed Falls prevention discussed;Education provided;Falls evaluation completed Education provided;Falls prevention discussed Education provided;Falls prevention discussed     Assessment & Plan  1. Atherosclerosis of aorta (HCC) (Primary)  - Lipid panel  2. Morbid obesity (HCC)  Discussed with the patient the risk posed by an increased BMI. Discussed importance of portion control, calorie counting and at least 150 minutes of physical activity weekly. Avoid sweet beverages and drink more water. Eat at least 6 servings of fruit and vegetables daily    3. Hodgkin's disease,  stage IIA (HCC)  Under the care of hematologist   4. Ischemic demyelination of brain (HCC)  Stable, discussed continue statin therapy, brain exercises   5. GERD without esophagitis  Controlled with medication   6. Essential hypertension  At goal   7. Pre-diabetes  Following a low carb diet  8. Age-related osteoporosis without current pathological fracture  - VITAMIN D 25 Hydroxy (Vit-D Deficiency, Fractures)  9. History of recent fall  Yesterday, tripped and fell backwards, discussed fall prevention   10. Low vitamin B12 level  - Vitamin B12  11. Hyperglycemia  - Hemoglobin A1c

## 2024-01-31 NOTE — Patient Instructions (Signed)
 Referral has been sent to:   [facility]   P: [phone] F: [phone]   Release ID # [id number]

## 2024-02-01 ENCOUNTER — Encounter: Payer: Self-pay | Admitting: Family Medicine

## 2024-02-01 LAB — LIPID PANEL
Cholesterol: 130 mg/dL (ref <200)
HDL: 69 mg/dL (ref >=50)
LDL Cholesterol (Calc): 47 mg/dL
Non-HDL Cholesterol (Calc): 61 mg/dL (ref <130)
Total CHOL/HDL Ratio: 1.9 (calc) (ref <5.0)
Triglycerides: 65 mg/dL (ref <150)

## 2024-02-01 LAB — HEMOGLOBIN A1C
Hgb A1c MFr Bld: 6 %{Hb} — ABNORMAL HIGH (ref <5.7)
Mean Plasma Glucose: 126 mg/dL
eAG (mmol/L): 7 mmol/L

## 2024-02-01 LAB — VITAMIN B12: Vitamin B-12: 344 pg/mL (ref 200–1100)

## 2024-02-01 LAB — VITAMIN D 25 HYDROXY (VIT D DEFICIENCY, FRACTURES): Vit D, 25-Hydroxy: 35 ng/mL (ref 30–100)

## 2024-02-04 DIAGNOSIS — Z96652 Presence of left artificial knee joint: Secondary | ICD-10-CM | POA: Diagnosis not present

## 2024-02-27 DIAGNOSIS — H35051 Retinal neovascularization, unspecified, right eye: Secondary | ICD-10-CM | POA: Diagnosis not present

## 2024-02-27 DIAGNOSIS — B880 Other acariasis: Secondary | ICD-10-CM | POA: Diagnosis not present

## 2024-02-27 DIAGNOSIS — H26493 Other secondary cataract, bilateral: Secondary | ICD-10-CM | POA: Diagnosis not present

## 2024-02-27 DIAGNOSIS — Z961 Presence of intraocular lens: Secondary | ICD-10-CM | POA: Diagnosis not present

## 2024-03-17 DIAGNOSIS — N3941 Urge incontinence: Secondary | ICD-10-CM | POA: Diagnosis not present

## 2024-03-17 DIAGNOSIS — R159 Full incontinence of feces: Secondary | ICD-10-CM | POA: Diagnosis not present

## 2024-03-17 DIAGNOSIS — N8110 Cystocele, unspecified: Secondary | ICD-10-CM | POA: Diagnosis not present

## 2024-03-17 DIAGNOSIS — R3914 Feeling of incomplete bladder emptying: Secondary | ICD-10-CM | POA: Diagnosis not present

## 2024-04-09 ENCOUNTER — Other Ambulatory Visit: Payer: Self-pay | Admitting: Family Medicine

## 2024-04-09 DIAGNOSIS — M170 Bilateral primary osteoarthritis of knee: Secondary | ICD-10-CM

## 2024-04-15 DIAGNOSIS — Z96652 Presence of left artificial knee joint: Secondary | ICD-10-CM | POA: Diagnosis not present

## 2024-04-20 ENCOUNTER — Other Ambulatory Visit: Payer: Self-pay | Admitting: Family Medicine

## 2024-04-20 DIAGNOSIS — F341 Dysthymic disorder: Secondary | ICD-10-CM

## 2024-04-24 DIAGNOSIS — B880 Other acariasis: Secondary | ICD-10-CM | POA: Diagnosis not present

## 2024-04-24 DIAGNOSIS — H35051 Retinal neovascularization, unspecified, right eye: Secondary | ICD-10-CM | POA: Diagnosis not present

## 2024-04-24 DIAGNOSIS — Z961 Presence of intraocular lens: Secondary | ICD-10-CM | POA: Diagnosis not present

## 2024-04-24 DIAGNOSIS — H26493 Other secondary cataract, bilateral: Secondary | ICD-10-CM | POA: Diagnosis not present

## 2024-05-07 ENCOUNTER — Other Ambulatory Visit: Payer: Self-pay | Admitting: Family Medicine

## 2024-05-07 DIAGNOSIS — I700 Atherosclerosis of aorta: Secondary | ICD-10-CM

## 2024-05-07 DIAGNOSIS — I10 Essential (primary) hypertension: Secondary | ICD-10-CM

## 2024-05-07 DIAGNOSIS — G3789 Other specified demyelinating diseases of central nervous system: Secondary | ICD-10-CM

## 2024-05-19 ENCOUNTER — Ambulatory Visit: Payer: Self-pay | Admitting: Urology

## 2024-05-21 ENCOUNTER — Other Ambulatory Visit: Payer: Self-pay | Admitting: Family Medicine

## 2024-05-26 ENCOUNTER — Ambulatory Visit: Admitting: Urology

## 2024-05-27 ENCOUNTER — Other Ambulatory Visit: Payer: Self-pay | Admitting: Family Medicine

## 2024-05-27 DIAGNOSIS — K219 Gastro-esophageal reflux disease without esophagitis: Secondary | ICD-10-CM

## 2024-05-27 DIAGNOSIS — I10 Essential (primary) hypertension: Secondary | ICD-10-CM

## 2024-06-05 ENCOUNTER — Inpatient Hospital Stay: Payer: Medicare Other | Attending: Oncology

## 2024-06-05 ENCOUNTER — Inpatient Hospital Stay: Payer: Medicare Other

## 2024-06-05 DIAGNOSIS — M810 Age-related osteoporosis without current pathological fracture: Secondary | ICD-10-CM | POA: Insufficient documentation

## 2024-06-05 DIAGNOSIS — Z853 Personal history of malignant neoplasm of breast: Secondary | ICD-10-CM | POA: Insufficient documentation

## 2024-06-05 DIAGNOSIS — Z8571 Personal history of Hodgkin lymphoma: Secondary | ICD-10-CM

## 2024-06-05 LAB — BASIC METABOLIC PANEL - CANCER CENTER ONLY
Anion gap: 11 (ref 5–15)
BUN: 19 mg/dL (ref 8–23)
CO2: 27 mmol/L (ref 22–32)
Calcium: 9.6 mg/dL (ref 8.9–10.3)
Chloride: 101 mmol/L (ref 98–111)
Creatinine: 1.01 mg/dL — ABNORMAL HIGH (ref 0.44–1.00)
GFR, Estimated: 57 mL/min — ABNORMAL LOW (ref >60)
Glucose, Bld: 104 mg/dL — ABNORMAL HIGH (ref 70–99)
Potassium: 4.3 mmol/L (ref 3.5–5.1)
Sodium: 139 mmol/L (ref 135–145)

## 2024-06-05 MED ORDER — DENOSUMAB 60 MG/ML ~~LOC~~ SOSY
60.0000 mg | PREFILLED_SYRINGE | Freq: Once | SUBCUTANEOUS | Status: AC
  Administered 2024-06-05: 60 mg via SUBCUTANEOUS
  Filled 2024-06-05: qty 1

## 2024-06-07 ENCOUNTER — Ambulatory Visit: Payer: Self-pay | Admitting: Oncology

## 2024-06-19 DIAGNOSIS — N3941 Urge incontinence: Secondary | ICD-10-CM | POA: Diagnosis not present

## 2024-06-19 DIAGNOSIS — R3914 Feeling of incomplete bladder emptying: Secondary | ICD-10-CM | POA: Diagnosis not present

## 2024-06-19 DIAGNOSIS — R159 Full incontinence of feces: Secondary | ICD-10-CM | POA: Diagnosis not present

## 2024-06-26 ENCOUNTER — Ambulatory Visit: Payer: Medicare Other

## 2024-06-26 DIAGNOSIS — Z0000 Encounter for general adult medical examination without abnormal findings: Secondary | ICD-10-CM

## 2024-06-26 DIAGNOSIS — N3941 Urge incontinence: Secondary | ICD-10-CM | POA: Diagnosis not present

## 2024-06-26 DIAGNOSIS — R159 Full incontinence of feces: Secondary | ICD-10-CM | POA: Diagnosis not present

## 2024-06-26 DIAGNOSIS — R3914 Feeling of incomplete bladder emptying: Secondary | ICD-10-CM | POA: Diagnosis not present

## 2024-06-26 NOTE — Patient Instructions (Signed)
 Ms. Das , Thank you for taking time out of your busy schedule to complete your Annual Wellness Visit with me. I enjoyed our conversation and look forward to speaking with you again next year. I, as well as your care team,  appreciate your ongoing commitment to your health goals. Please review the following plan we discussed and let me know if I can assist you in the future.   Follow up Visits: 07/03/25 @ 11:30 AM BY PHONE We will see or speak with you next year for your Next Medicare AWV with our clinical staff Have you seen your provider in the last 6 months (3 months if uncontrolled diabetes)? Yes  Clinician Recommendations:  Aim for 30 minutes of exercise or brisk walking, 6-8 glasses of water , and 5 servings of fruits and vegetables each day. TAKE CARE!      This is a list of the screenings recommended for you:  Health Maintenance  Topic Date Due   COVID-19 Vaccine (7 - 2024-25 season) 07/01/2023   Flu Shot  05/30/2024   Mammogram  07/29/2024   DEXA scan (bone density measurement)  05/07/2025   Medicare Annual Wellness Visit  06/26/2025   DTaP/Tdap/Td vaccine (3 - Td or Tdap) 02/16/2032   Pneumococcal Vaccine for age over 28  Completed   Hepatitis C Screening  Completed   Zoster (Shingles) Vaccine  Completed   HPV Vaccine  Aged Out   Meningitis B Vaccine  Aged Out   Colon Cancer Screening  Discontinued    Advanced directives: (ACP Link)Information on Advanced Care Planning can be found at [place]  Secretary of [facility] Advance Health Care Directives Advance Health Care Directives. [email or web address]  Advance Care Planning is important because it:  [x]  Makes sure you receive the medical care that is consistent with your values, goals, and preferences  [x]  It provides guidance to your family and loved ones and reduces their decisional burden about whether or not they are making the right decisions based on your wishes.  Follow the link provided in your after visit summary or read over  the paperwork we have mailed to you to help you started getting your Advance Directives in place. If you need assistance in completing these, please reach out to us  so that we can help you!

## 2024-06-26 NOTE — Progress Notes (Signed)
 Subjective:   Mary Todd is a 78 y.o. who presents for a Medicare Wellness preventive visit.  As a reminder, Annual Wellness Visits don't include a physical exam, and some assessments may be limited, especially if this visit is performed virtually. We may recommend an in-person follow-up visit with your provider if needed.  Visit Complete: Virtual I connected with  Merlynn Harl Hartman on 06/26/24 by a audio enabled telemedicine application and verified that I am speaking with the correct person using two identifiers.  Patient Location: Home  Provider Location: Home Office  I discussed the limitations of evaluation and management by telemedicine. The patient expressed understanding and agreed to proceed.  Vital Signs: Because this visit was a virtual/telehealth visit, some criteria may be missing or patient reported. Any vitals not documented were not able to be obtained and vitals that have been documented are patient reported.  VideoDeclined- This patient declined Librarian, academic. Therefore the visit was completed with audio only.  Persons Participating in Visit: Patient.  AWV Questionnaire: No: Patient Medicare AWV questionnaire was not completed prior to this visit.  Cardiac Risk Factors include: advanced age (>27men, >68 women);hypertension;obesity (BMI >30kg/m2)     Objective:    There were no vitals filed for this visit. There is no height or weight on file to calculate BMI.     06/26/2024    3:39 PM 12/06/2023    2:18 PM 10/28/2023    1:11 PM 07/14/2023    2:27 PM 06/21/2023    3:38 PM 12/28/2022   10:11 AM 08/22/2022    4:18 PM  Advanced Directives  Does Patient Have a Medical Advance Directive? No No No No Yes Yes No  Type of Agricultural consultant;Living will Healthcare Power of [place];Living will   Copy of Healthcare Power of Attorney in Chart?      No - copy requested   Would patient like  information on creating a medical advance directive? No - Patient declined No - Patient declined         Current Medications (verified) Outpatient Encounter Medications as of 06/26/2024  Medication Sig   acetaminophen  (TYLENOL ) 650 MG CR tablet Take 1,300 mg by mouth every 8 (eight) hours as needed for pain.   amLODipine -valsartan  (EXFORGE ) 5-160 MG tablet Take 1 tablet by mouth once daily   atorvastatin  (LIPITOR) 20 MG tablet Take 1 tablet by mouth once daily   buPROPion  (WELLBUTRIN  XL) 150 MG 24 hr tablet Take 1 tablet by mouth once daily   CALCIUM -MAGNESIUM PO Take 1 tablet by mouth daily.   cetirizine (ZYRTEC) 10 MG tablet Take 10 mg by mouth daily.   Cholecalciferol (VITAMIN D -3 PO) Take 1 tablet by mouth daily.   denosumab  (PROLIA ) 60 MG/ML SOSY injection Inject 60 mg into the skin every 6 (six) months.   metoprolol  succinate (TOPROL -XL) 50 MG 24 hr tablet TAKE 1 TABLET DAILY BY MOUTH. TAKE WITH OR IMMEDIATLY FOLLOWING A MEAL   Multiple Vitamin (MULTIVITAMIN) tablet Take 1 tablet by mouth daily.   nystatin  (MYCOSTATIN /NYSTOP ) powder APPLY  POWDER TOPICALLY TWICE DAILY   pantoprazole  (PROTONIX ) 40 MG tablet Take 1 tablet by mouth once daily   sertraline  (ZOLOFT ) 100 MG tablet TAKE 1 TABLET BY MOUTH IN THE MORNING   [DISCONTINUED] albuterol  (VENTOLIN  HFA) 108 (90 Base) MCG/ACT inhaler INHALE 2 PUFFS BY MOUTH EVERY 4 HOURS AS NEEDED FOR WHEEZING OR SHORTNESS OF BREATH   [DISCONTINUED] meloxicam  (MOBIC ) 15  MG tablet Take 1 tablet by mouth once daily   [DISCONTINUED] tolterodine  (DETROL  LA) 4 MG 24 hr capsule Take 1 capsule (4 mg total) by mouth daily.   No facility-administered encounter medications on file as of 06/26/2024.    Allergies (verified) Shellfish allergy, Amoxicillin-pot clavulanate, and Shellfish-derived products   History: Past Medical History:  Diagnosis Date   Allergy    Arthritis    knees, lower back   Cancer [facility])    Breast cancer   Esophageal reflux     Hypertension 2006   Osteoporosis    Personal history of malignant neoplasm of breast 2011   right breast   Personal history of radiation therapy 2011   RIGHT lumpectomy w/ radiation   Past Surgical History:  Procedure Laterality Date   ABDOMINAL HYSTERECTOMY     BACK SURGERY     BREAST BIOPSY Right 2011   radiation   BREAST LUMPECTOMY Right 2011   w/ radiation   BREAST SURGERY Right 2011   right breast lumpectomy,L/SN/R for right breast CA    CATARACT EXTRACTION W/PHACO Left 06/10/2018   Procedure: CATARACT EXTRACTION PHACO AND INTRAOCULAR LENS PLACEMENT (IOC) LEFT;  Surgeon: Myrna Adine Anes, MD;  Location: [facility];  Service: Ophthalmology;  Laterality: Left;  Diabetic - oral meds   CATARACT EXTRACTION W/PHACO Right 07/15/2018   Procedure: CATARACT EXTRACTION PHACO AND INTRAOCULAR LENS PLACEMENT (IOC) RIGHT;  Surgeon: Myrna Adine Anes, MD;  Location: [facility];  Service: Ophthalmology;  Laterality: Right;   COLONOSCOPY  2003   COLONOSCOPY N/A 03/09/2022   Procedure: COLONOSCOPY WITH BIOPSY;  Surgeon: Jinny Carmine, MD;  Location: [facility];  Service: Endoscopy;  Laterality: N/A;   POLYPECTOMY N/A 03/09/2022   Procedure: POLYPECTOMY;  Surgeon: Jinny Carmine, MD;  Location: [facility];  Service: Endoscopy;  Laterality: N/A;   PORT A CATH REVISION     REPLACEMENT TOTAL KNEE Right 10/03/2019   DR. Bowers    TOTAL KNEE ARTHROPLASTY Left 12/20/2023   Dr[facility] Leora at [facility]   WRIST SURGERY  2005   Family History  Problem Relation Age of Onset   Cancer Father    Bone cancer Father    Breast cancer Mother 12   Stroke Mother    Colon cancer Maternal Grandmother    Breast cancer Cousin    Social History   Socioeconomic History   Marital status: Married    Spouse name: Elsie   Number of children: 2   Years of education: some college   Highest education level: Associate degree: occupational, Scientist, product/process development, or vocational program   Occupational History   Occupation: Retired  Tobacco Use   Smoking status: Never    Passive exposure: Never   Smokeless tobacco: Never   Tobacco comments:    smoking cessation materials not required  Vaping Use   Vaping status: Never Used  Substance and Sexual Activity   Alcohol use: Yes    Comment: occasionally   Drug use: No   Sexual activity: Not Currently  Other Topics Concern   Not on file  Social History Narrative   Not on file   Social Drivers of Health   Financial Resource Strain: Low Risk  (06/26/2024)   Overall Financial Resource Strain (CARDIA)    Difficulty of Paying Living Expenses: Not very hard  Recent Concern: Financial Resource Strain - Medium Risk (06/25/2024)   Overall Financial Resource Strain (CARDIA)    Difficulty of Paying Living Expenses: Somewhat hard  Food Insecurity: No  Food Insecurity (06/26/2024)   Hunger Vital Sign    Worried About Running Out of Food in the Last Year: Never true    Ran Out of Food in the Last Year: Never true  Recent Concern: Food Insecurity - Food Insecurity Present (06/25/2024)   Hunger Vital Sign    Worried About Running Out of Food in the Last Year: Sometimes true    Ran Out of Food in the Last Year: Never true  Transportation Needs: No Transportation Needs (06/26/2024)   PRAPARE - Administrator, Civil Service (Medical): No    Lack of Transportation (Non-Medical): No  Physical Activity: Insufficiently Active (06/26/2024)   Exercise Vital Sign    Days of Exercise per Week: 2 days    Minutes of Exercise per Session: 20 min  Stress: No Stress Concern Present (06/26/2024)   [employer] of Occupational Health - Occupational Stress Questionnaire    Feeling of Stress: Only a little  Social Connections: Socially Integrated (06/26/2024)   Social Connection and Isolation Panel    Frequency of Communication with Friends and Family: Three times a week    Frequency of Social Gatherings with Friends and Family: Twice a  week    Attends Religious Services: More than 4 times per year    Active Member of [employer] or Organizations: Yes    Attends Engineer, structural: More than 4 times per year    Marital Status: Married    Tobacco Counseling Counseling given: Not Answered Tobacco comments: smoking cessation materials not required    Clinical Intake:  Pre-visit preparation completed: Yes  Pain : No/denies pain     BMI - recorded: 41.8 Nutritional Status: BMI > 30  Obese Nutritional Risks: None Diabetes: No  Lab Results  Component Value Date   HGBA1C 6.0 (H) 01/31/2024   HGBA1C 6.2 (A) 06/22/2023   HGBA1C 6.1 (H) 12/20/2022     How often do you need to have someone help you when you read instructions, pamphlets, or other written materials from your doctor or pharmacy?: 1 - Never  Interpreter Needed?: No  Information entered by :: JHONNIE DAS, LPN   Activities of Daily Living    06/26/2024    3:40 PM 06/25/2024   11:57 AM  In your present state of health, do you have any difficulty performing the following activities:  Hearing? 0 0  Vision? 0 0  Difficulty concentrating or making decisions? 0 0  Walking or climbing stairs? 0 0  Comment DEPENDS ON NUMBER OF STAIRS   Dressing or bathing? 0 0  Doing errands, shopping? 0 0  Preparing Food and eating ? N N  Using the Toilet? N N  In the past six months, have you accidently leaked urine? Y Y  Do you have problems with loss of bowel control? Y Y  Managing your Medications? N N  Managing your Finances? N N  Housekeeping or managing your Housekeeping? N Y    Patient Care Team: Sowles, Krichna, MD as PCP - General (Family Medicine) Solum, Therisa HERO, MD as Consulting Physician (Endocrinology) Helon Clotilda DELENA DEVONNA as Physician Assistant (Urology) Janit Thresa HERO, DPM as Consulting Physician (Podiatry) Leora Lynwood SAUNDERS, MD as Consulting Physician (Orthopedic Surgery) Babara Call, MD as Consulting Physician (Oncology) Darliss Rogue, MD as Consulting Physician (Cardiology) Myrna Adine Anes, MD as Consulting Physician (Ophthalmology) Darlis Deatrice RAMAN, MD as Consulting Physician (Pain Medicine)  I have updated your Care Teams any recent Medical Services you may have  received from other providers in the past year.     Assessment:   This is a routine wellness examination for Antara.  Hearing/Vision screen Hearing Screening - Comments:: NO AIDS Vision Screening - Comments:: READERS- DR.KING   Goals Addressed             This Visit's Progress    DIET - EAT MORE FRUITS AND VEGETABLES         Depression Screen     06/26/2024    3:37 PM 01/31/2024   10:37 AM 10/01/2023   11:20 AM 06/22/2023   10:08 AM 06/21/2023    3:35 PM 03/21/2023   10:22 AM 12/20/2022    9:39 AM  PHQ 2/9 Scores  PHQ - 2 Score 0 0 0 0 0 0 4  PHQ- 9 Score 0 0 0 0  0 6    Fall Risk     06/26/2024    3:40 PM 06/25/2024   11:57 AM 01/31/2024   10:37 AM 10/01/2023   11:20 AM 06/22/2023   10:07 AM  Fall Risk   Falls in the past year? 0 0 1 1 0  Number falls in past yr: 0 0 0 0 0  Injury with Fall? 0 0 0 0 0  Risk for fall due to : No Fall Risks  Impaired balance/gait Impaired balance/gait No Fall Risks  Follow up Falls evaluation completed;Falls prevention discussed  Education provided;Falls evaluation completed;Falls prevention discussed Falls prevention discussed;Education provided;Falls evaluation completed Falls prevention discussed;Education provided;Falls evaluation completed    MEDICARE RISK AT HOME:  Medicare Risk at Home Any stairs in or around the home?: Yes If so, are there any without handrails?: No Home free of loose throw rugs in walkways, pet beds, electrical cords, etc?: Yes Adequate lighting in your home to reduce risk of falls?: Yes Life alert?: No Use of a cane, walker or w/c?: No Grab bars in the bathroom?: Yes Shower chair or bench in shower?: No Elevated toilet seat or a handicapped toilet?: Yes  TIMED UP AND  GO:  Was the test performed?  No  Cognitive Function: 6CIT completed        06/26/2024    3:43 PM 06/21/2023    3:42 PM 01/01/2020   10:17 AM 12/27/2018   10:07 AM 12/25/2017   11:06 AM  6CIT Screen  What Year? 0 points 0 points 0 points 0 points 0 points  What month? 3 points 0 points 0 points 0 points 0 points  What time? 0 points 0 points 0 points 0 points 0 points  Count back from 20 0 points 0 points 0 points 0 points 0 points  Months in reverse 0 points 0 points 0 points 2 points 0 points  Repeat phrase 0 points 0 points 0 points 0 points 0 points  Total Score 3 points 0 points 0 points 2 points 0 points    Immunizations Immunization History  Administered Date(s) Administered   Fluad Quad(high Dose 65+) 08/11/2019, 07/06/2020, 07/08/2021, 08/07/2022   Fluad Trivalent(High Dose 65+) 10/01/2023   INFLUENZA, HIGH DOSE SEASONAL PF 09/20/2015, 07/17/2016, 07/03/2017, 07/03/2018   Influenza-Unspecified 07/30/2013   Moderna Sars-Covid-2 Vaccination 12/05/2019, 01/02/2020, 08/31/2020, 03/24/2021   Pfizer Covid-19 Vaccine Bivalent Booster 66yrs & up 08/08/2021   Pneumococcal Conjugate-13 05/04/2016   Pneumococcal Polysaccharide-23 08/27/2007, 06/17/2013   RSV,unspecified 11/15/2022   Tdap 09/16/2010, 02/15/2022   Unspecified SARS-COV-2 Vaccination 11/15/2022   Zoster Recombinant(Shingrix) 05/30/2018, 07/29/2018   Zoster, Live 02/28/2011    Screening Tests Health  Maintenance  Topic Date Due   COVID-19 Vaccine (7 - 2024-25 season) 07/01/2023   INFLUENZA VACCINE  05/30/2024   MAMMOGRAM  07/29/2024   DEXA SCAN  05/07/2025   Medicare Annual Wellness (AWV)  06/26/2025   DTaP/Tdap/Td (3 - Td or Tdap) 02/16/2032   Pneumococcal Vaccine: 50+ Years  Completed   Hepatitis C Screening  Completed   Zoster Vaccines- Shingrix  Completed   HPV VACCINES  Aged Out   Meningococcal B Vaccine  Aged Out   Colonoscopy  Discontinued    Health Maintenance  Health Maintenance Due  Topic  Date Due   COVID-19 Vaccine (7 - 2024-25 season) 07/01/2023   INFLUENZA VACCINE  05/30/2024   Health Maintenance Items Addressed: UP TO DATE ON MAMMOGRAM & BDS; UP TO DATE ON SHOTS EXCEPT PNA  Additional Screening:  Vision Screening: Recommended annual ophthalmology exams for early detection of glaucoma and other disorders of the eye. Would you like a referral to an eye doctor? No    Dental Screening: Recommended annual dental exams for proper oral hygiene  Community Resource Referral / Chronic Care Management: CRR required this visit?  No   CCM required this visit?  No   Plan:    I have personally reviewed and noted the following in the patient's chart:   Medical and social history Use of alcohol, tobacco or illicit drugs  Current medications and supplements including opioid prescriptions. Patient is not currently taking opioid prescriptions. Functional ability and status Nutritional status Physical activity Advanced directives List of other physicians Hospitalizations, surgeries, and ER visits in previous 12 months Vitals Screenings to include cognitive, depression, and falls Referrals and appointments  In addition, I have reviewed and discussed with patient certain preventive protocols, quality metrics, and best practice recommendations. A written personalized care plan for preventive services as well as general preventive health recommendations were provided to patient.   Jhonnie GORMAN Das, LPN   [id number]   After Visit Summary: (MyChart) Due to this being a telephonic visit, the after visit summary with patients personalized plan was offered to patient via MyChart   Notes: Nothing significant to report at this time.

## 2024-07-09 ENCOUNTER — Other Ambulatory Visit: Payer: Self-pay | Admitting: Family Medicine

## 2024-07-09 DIAGNOSIS — M170 Bilateral primary osteoarthritis of knee: Secondary | ICD-10-CM

## 2024-07-22 ENCOUNTER — Other Ambulatory Visit: Payer: Self-pay | Admitting: Family Medicine

## 2024-07-22 DIAGNOSIS — F341 Dysthymic disorder: Secondary | ICD-10-CM

## 2024-07-30 ENCOUNTER — Ambulatory Visit
Admission: RE | Admit: 2024-07-30 | Discharge: 2024-07-30 | Disposition: A | Discharge status: Home / Self Care | Source: Ambulatory Visit | Attending: Oncology | Admitting: Oncology

## 2024-07-30 DIAGNOSIS — Z1231 Encounter for screening mammogram for malignant neoplasm of breast: Secondary | ICD-10-CM | POA: Insufficient documentation

## 2024-08-01 ENCOUNTER — Ambulatory Visit: Admitting: Family Medicine

## 2024-08-02 ENCOUNTER — Other Ambulatory Visit: Payer: Self-pay | Admitting: Family Medicine

## 2024-08-02 DIAGNOSIS — I10 Essential (primary) hypertension: Secondary | ICD-10-CM

## 2024-08-02 DIAGNOSIS — I6782 Cerebral ischemia: Secondary | ICD-10-CM

## 2024-08-02 DIAGNOSIS — I700 Atherosclerosis of aorta: Secondary | ICD-10-CM

## 2024-08-04 ENCOUNTER — Ambulatory Visit (INDEPENDENT_AMBULATORY_CARE_PROVIDER_SITE_OTHER): Admitting: Family Medicine

## 2024-08-04 ENCOUNTER — Encounter: Payer: Self-pay | Admitting: Family Medicine

## 2024-08-04 VITALS — BP 134/80 | HR 83 | Resp 16 | Ht 63.0 in | Wt 231.1 lb

## 2024-08-04 DIAGNOSIS — I700 Atherosclerosis of aorta: Secondary | ICD-10-CM | POA: Diagnosis not present

## 2024-08-04 DIAGNOSIS — I6782 Cerebral ischemia: Secondary | ICD-10-CM | POA: Diagnosis not present

## 2024-08-04 DIAGNOSIS — M545 Low back pain, unspecified: Secondary | ICD-10-CM | POA: Diagnosis not present

## 2024-08-04 DIAGNOSIS — R7303 Prediabetes: Secondary | ICD-10-CM | POA: Diagnosis not present

## 2024-08-04 DIAGNOSIS — R7989 Other specified abnormal findings of blood chemistry: Secondary | ICD-10-CM | POA: Insufficient documentation

## 2024-08-04 DIAGNOSIS — R944 Abnormal results of kidney function studies: Secondary | ICD-10-CM | POA: Diagnosis not present

## 2024-08-04 DIAGNOSIS — K219 Gastro-esophageal reflux disease without esophagitis: Secondary | ICD-10-CM

## 2024-08-04 DIAGNOSIS — Z23 Encounter for immunization: Secondary | ICD-10-CM

## 2024-08-04 DIAGNOSIS — C8190 Hodgkin lymphoma, unspecified, unspecified site: Secondary | ICD-10-CM | POA: Diagnosis not present

## 2024-08-04 DIAGNOSIS — F341 Dysthymic disorder: Secondary | ICD-10-CM

## 2024-08-04 DIAGNOSIS — G8929 Other chronic pain: Secondary | ICD-10-CM

## 2024-08-04 DIAGNOSIS — G3789 Other specified demyelinating diseases of central nervous system: Secondary | ICD-10-CM

## 2024-08-04 DIAGNOSIS — I10 Essential (primary) hypertension: Secondary | ICD-10-CM

## 2024-08-04 DIAGNOSIS — R159 Full incontinence of feces: Secondary | ICD-10-CM | POA: Insufficient documentation

## 2024-08-04 LAB — COMPREHENSIVE METABOLIC PANEL WITH GFR
AG Ratio: 1.5 (calc) (ref 1.0–2.5)
ALT: 25 U/L (ref 6–29)
AST: 15 U/L (ref 10–35)
Albumin: 4.4 g/dL (ref 3.6–5.1)
Alkaline phosphatase (APISO): 104 U/L (ref 37–153)
BUN: 15 mg/dL (ref 7–25)
CO2: 27 mmol/L (ref 20–32)
Calcium: 9.1 mg/dL (ref 8.6–10.4)
Chloride: 101 mmol/L (ref 98–110)
Creat: 0.67 mg/dL (ref 0.60–1.00)
Globulin: 2.9 g/dL (ref 1.9–3.7)
Glucose, Bld: 95 mg/dL (ref 65–99)
Potassium: 4.6 mmol/L (ref 3.5–5.3)
Sodium: 136 mmol/L (ref 135–146)
Total Bilirubin: 0.5 mg/dL (ref 0.2–1.2)
Total Protein: 7.3 g/dL (ref 6.1–8.1)
eGFR: 90 mL/min/1.73m2 (ref >=60)

## 2024-08-04 MED ORDER — PANTOPRAZOLE SODIUM 40 MG PO TBEC: 40.0000 mg | DELAYED_RELEASE_TABLET | Freq: Every day | ORAL | 1 refills | Status: AC

## 2024-08-04 MED ORDER — AMLODIPINE BESYLATE-VALSARTAN 5-160 MG PO TABS: 1.0000 | ORAL_TABLET | Freq: Every day | ORAL | 1 refills | Status: AC

## 2024-08-04 MED ORDER — TRAMADOL HCL 50 MG PO TABS: 50.0000 mg | ORAL_TABLET | ORAL | 0 refills | Status: DC

## 2024-08-04 MED ORDER — SERTRALINE HCL 100 MG PO TABS: 100.0000 mg | ORAL_TABLET | Freq: Every morning | ORAL | 1 refills | Status: AC

## 2024-08-04 MED ORDER — BUPROPION HCL ER (XL) 150 MG PO TB24: 150.0000 mg | ORAL_TABLET | Freq: Every day | ORAL | 1 refills | Status: AC

## 2024-08-04 MED ORDER — METOPROLOL SUCCINATE ER 50 MG PO TB24: 50.0000 mg | ORAL_TABLET | Freq: Every day | ORAL | 1 refills | Status: AC

## 2024-08-04 NOTE — Progress Notes (Signed)
 Name: Mary Todd   MRN: [id number]    DOB: Mar 18, 1946   Date:08/04/2024       Progress Note  Subjective  Chief Complaint  Chief Complaint  Patient presents with   Medical Management of Chronic Issues   Discussed the use of AI scribe software for clinical note transcription with the patient, who gave verbal consent to proceed.  History of Present Illness Mary Todd is a 78 year old female who presents for a regular follow-up.  She experiences chronic pain primarily during the day, rated as 5 out of 10, which limits her physical activity but does not affect her sleep. The pain is localized to the middle lower back and does not radiate down the legs. She has a history of osteoarthritis in the knee and degenerative disc disease in the back. She previously used meloxicam  but stopped due to kidney concerns and is currently managing pain with Tylenol  650 mg, initially exceeding the recommended dose but now plans to adjust her intake.  Her kidney function has shown a decrease in GFR to 57 as of August, which was normal prior to that. She is concerned about her kidney function and has stopped taking meloxicam  as a precaution. She is currently using Tylenol  for pain management.  She has a history of bladder prolapse and manages her symptoms by scheduling bathroom visits every three hours. She did not complete physical therapy due to logistical issues.  She has a history of prediabetes with a slightly elevated A1c. No increased hunger, thirst, or urination. She experienced syncopal episodes in 2022, for which she saw a cardiologist and neurologist. A sleep study indicated mild obstructive sleep apnea, but she has not started treatment.  Her hypertension is managed with metoprolol  and amlodipine /valsartan . She monitors her blood pressure at home but needs a new monitor. She also takes atorvastatin  for high cholesterol, which is well-controlled.  She has a history of ischemic  demyelination of the brain and atherosclerosis, she has noticed difficulty with her balance lately  She takes Wellbutrin  and Zoloft  for mood, which helps her tolerate irritations better.  She has a history of Hodgkin's disease and breast cancer, both under surveillance. She sees her specialist twice a year and recently had a mammogram. She completed treatment for breast cancer and is on Prolia  for osteoporosis.      Patient Active Problem List   Diagnosis Date Noted   Chronic midline low back pain with sciatica 10/01/2023   Hodgkin's disease, stage IIA (HCC) 10/01/2023   Ischemic demyelination of brain (HCC) 10/01/2023   Atherosclerosis of aorta 10/01/2023   Polyp of transverse colon    Total knee replacement status, right 10/03/2019   Anterolisthesis 08/06/2017   DDD (degenerative disc disease), lumbosacral 08/06/2017   Chondromalacia patellae 07/03/2017   Osteoporosis 12/14/2016   Morbid obesity (HCC) 07/17/2016   Prolapsed cervical intervertebral disc 06/06/2016   Spinal stenosis in cervical region 05/22/2016   Thyroid  nodule 05/22/2016   GERD without esophagitis 03/16/2016   Neck muscle spasm 03/16/2016   History of back surgery 03/16/2016   Osteoarthritis of both knees 03/16/2016   Large breasts 03/16/2016   Essential hypertension 03/16/2016   Perennial allergic rhinitis with seasonal variation 03/16/2016   History of pneumococcal pneumonia 09/20/2015   History of Hodgkin's lymphoma 07/15/2014   Breast cancer, right (HCC) 06/26/2013    Past Surgical History:  Procedure Laterality Date   ABDOMINAL HYSTERECTOMY     BACK SURGERY     BREAST BIOPSY Right 2011  radiation   BREAST LUMPECTOMY Right 2011   w/ radiation   BREAST SURGERY Right 2011   right breast lumpectomy,L/SN/R for right breast CA    CATARACT EXTRACTION W/PHACO Left 06/10/2018   Procedure: CATARACT EXTRACTION PHACO AND INTRAOCULAR LENS PLACEMENT (IOC) LEFT;  Surgeon: Myrna Adine Anes, MD;  Location:  [facility];  Service: Ophthalmology;  Laterality: Left;  Diabetic - oral meds   CATARACT EXTRACTION W/PHACO Right 07/15/2018   Procedure: CATARACT EXTRACTION PHACO AND INTRAOCULAR LENS PLACEMENT (IOC) RIGHT;  Surgeon: Myrna Adine Anes, MD;  Location: [facility];  Service: Ophthalmology;  Laterality: Right;   COLONOSCOPY  2003   COLONOSCOPY N/A 03/09/2022   Procedure: COLONOSCOPY WITH BIOPSY;  Surgeon: Jinny Carmine, MD;  Location: [facility];  Service: Endoscopy;  Laterality: N/A;   EYE SURGERY  cataract both eyes   JOINT REPLACEMENT  knee 2020   POLYPECTOMY N/A 03/09/2022   Procedure: POLYPECTOMY;  Surgeon: Jinny Carmine, MD;  Location: [facility];  Service: Endoscopy;  Laterality: N/A;   PORT A CATH REVISION     REPLACEMENT TOTAL KNEE Right 10/03/2019   DR. Bowers    TOTAL KNEE ARTHROPLASTY Left 12/20/2023   Dr. Leora at [facility]   TUBAL LIGATION  1975   WRIST SURGERY  2005    Family History  Problem Relation Age of Onset   Cancer Father    Bone cancer Father    Breast cancer Mother 52   Stroke Mother    Colon cancer Maternal Grandmother    Breast cancer Cousin     Social History   Tobacco Use   Smoking status: Never    Passive exposure: Never   Smokeless tobacco: Never   Tobacco comments:    smoking cessation materials not required  Substance Use Topics   Alcohol use: Yes    Comment: occasionally     Current Outpatient Medications:    acetaminophen  (TYLENOL ) 650 MG CR tablet, Take 1,300 mg by mouth every 8 (eight) hours as needed for pain., Disp: , Rfl:    amLODipine -valsartan  (EXFORGE ) 5-160 MG tablet, Take 1 tablet by mouth once daily, Disp: 90 tablet, Rfl: 0   atorvastatin  (LIPITOR) 20 MG tablet, Take 1 tablet by mouth once daily, Disp: 90 tablet, Rfl: 0   buPROPion  (WELLBUTRIN  XL) 150 MG 24 hr tablet, Take 1 tablet by mouth once daily, Disp: 90 tablet, Rfl: 0   CALCIUM -MAGNESIUM PO, Take 1 tablet by mouth daily.,  Disp: , Rfl:    cetirizine (ZYRTEC) 10 MG tablet, Take 10 mg by mouth daily., Disp: , Rfl:    Cholecalciferol (VITAMIN D -3 PO), Take 1 tablet by mouth daily., Disp: , Rfl:    denosumab  (PROLIA ) 60 MG/ML SOSY injection, Inject 60 mg into the skin every 6 (six) months., Disp: , Rfl:    metoprolol  succinate (TOPROL -XL) 50 MG 24 hr tablet, TAKE 1 TABLET DAILY BY MOUTH. TAKE WITH OR IMMEDIATLY FOLLOWING A MEAL, Disp: 90 tablet, Rfl: 0   Multiple Vitamin (MULTIVITAMIN) tablet, Take 1 tablet by mouth daily., Disp: , Rfl:    nystatin  (MYCOSTATIN /NYSTOP ) powder, APPLY  POWDER TOPICALLY TWICE DAILY, Disp: 60 g, Rfl: 0   pantoprazole  (PROTONIX ) 40 MG tablet, Take 1 tablet by mouth once daily, Disp: 90 tablet, Rfl: 0   sertraline  (ZOLOFT ) 100 MG tablet, TAKE 1 TABLET BY MOUTH IN THE MORNING, Disp: 30 tablet, Rfl: 0  Allergies  Allergen Reactions   Shellfish Allergy Hives   Amoxicillin-Pot Clavulanate Nausea Only  Shellfish-Derived Products Hives    I personally reviewed active problem list, medication list, allergies with the patient/caregiver today.   ROS  Ten systems reviewed and is negative except as mentioned in HPI    Objective Physical Exam  CONSTITUTIONAL: Patient appears well-developed and well-nourished.  No distress. HEENT: Head atraumatic, normocephalic, neck supple. CARDIOVASCULAR: Normal rate, regular rhythm and normal heart sounds.  No murmur heard. No BLE edema. PULMONARY: Effort normal and breath sounds normal. No respiratory distress. ABDOMINAL: There is no tenderness or distention. MUSCULOSKELETAL: slow gait, OA knee  PSYCHIATRIC: Patient has a normal mood and affect. behavior is normal. Judgment and thought content normal.  Vitals:   08/04/24 1406  BP: 134/80  Pulse: 83  Resp: 16  SpO2: 98%  Weight: 231 lb 1.6 oz (104.8 kg)  Height: 5' 3 (1.6 m)    Body mass index is 40.94 kg/m.  Recent Results (from the past 2160 hours)  Basic Metabolic Panel - Cancer [facility]  Only     Status: Abnormal   Collection Time: 06/05/24  2:28 PM  Result Value Ref Range   Sodium 139 135 - 145 mmol/L   Potassium 4.3 3.5 - 5.1 mmol/L   Chloride 101 98 - 111 mmol/L   CO2 27 22 - 32 mmol/L   Glucose, Bld 104 (H) 70 - 99 mg/dL    Comment: Glucose reference range applies only to samples taken after fasting for at least 8 hours.   BUN 19 8 - 23 mg/dL   Creatinine 8.98 (H) 9.55 - 1.00 mg/dL   Calcium  9.6 8.9 - 10.3 mg/dL   GFR, Estimated 57 (L) >60 mL/min    Comment: (NOTE) Calculated using the CKD-EPI Creatinine Equation (2021)    Anion gap 11 5 - 15    Comment: Performed at [facility], [address] [place], [place] [address]     PHQ2/9:    08/04/2024    1:59 PM 06/26/2024    3:37 PM 01/31/2024   10:37 AM 10/01/2023   11:20 AM 06/22/2023   10:08 AM  Depression screen PHQ 2/9  Decreased Interest 0 0 0 0 0  Down, Depressed, Hopeless 0 0 0 0 0  PHQ - 2 Score 0 0 0 0 0  Altered sleeping 0 0 0 0 0  Tired, decreased energy 0 0 0 0 0  Change in appetite 0 0 0 0 0  Feeling bad or failure about yourself  0 0 0 0 0  Trouble concentrating 0 0 0 0 0  Moving slowly or fidgety/restless 0 0 0 0 0  Suicidal thoughts 0 0 0 0 0  PHQ-9 Score 0 0 0 0 0  Difficult doing work/chores Not difficult at all Not difficult at all Not difficult at all Not difficult at all Not difficult at all    phq 9 is negative  Fall Risk:    08/04/2024    1:59 PM 06/26/2024    3:40 PM 06/25/2024   11:57 AM 01/31/2024   10:37 AM 10/01/2023   11:20 AM  Fall Risk   Falls in the past year? 0 0 0 1 1  Number falls in past yr: 0 0 0 0 0  Injury with Fall? 0 0 0 0 0  Risk for fall due to : No Fall Risks No Fall Risks  Impaired balance/gait Impaired balance/gait  Follow up Falls evaluation completed Falls evaluation completed;Falls prevention discussed  Education provided;Falls evaluation completed;Falls prevention discussed Falls prevention discussed;Education provided;Falls evaluation  completed      Assessment & Plan Chronic pain due to osteoarthritis of knee and degenerative disc disease of lumbar spine, post-fusion Chronic pain in knee and lumbar spine, pain level 5/10, managed with Tylenol  above recommended dosage. - Prescribed tramadol  90-day supply, 1 tablet in the morning with Tylenol , monitor for drowsiness. - Limit Tylenol  to 4 tablets per day, 1 in the morning, 1 at lunch, 2 at night. - Signed pain management contract for tramadol  use. - Schedule follow-up in 3 months to assess pain management efficacy.  Bladder prolapse (cystocele) with urinary and fecal incontinence Bladder prolapse with urinary and fecal incontinence, previous physical therapy incomplete, no surgical intervention planned. - Consider referral to OB/GYN for further management options. - Continue scheduled voiding every three hours.  Low GFR Stage 2 CKD with GFR 57, meloxicam  stopped due to kidney impact, Tylenol  considered safe. - Order comprehensive metabolic panel to recheck kidney function and liver enzymes.  Hypertension Hypertension managed with metoprolol  and amlodipine /valsartan , BP 134/80 mmHg acceptable. - Continue current antihypertensive regimen. - Monitor blood pressure at home with a new monitor.  Hyperlipidemia Hyperlipidemia managed with atorvastatin , no issues reported. - Continue atorvastatin  therapy.  Obesity Morbid obesity with BMI over 40, weight loss could benefit other conditions. - Encourage lifestyle modifications including portion control and increased physical activity as pain allows.  Prediabetes History of prediabetes, no current diabetes symptoms.  Osteoporosis Osteoporosis managed with Prolia , regular follow-up with oncologist for bone density monitoring. - Continue Prolia  injections.  Right breast cancer, stage 1, post-treatment, under surveillance Stage 1 right breast cancer, post-treatment, under surveillance, recent mammogram completed. -  Continue regular follow-up with oncologist.  Hodgkin lymphoma, stage 2A, under surveillance Stage 2A Hodgkin lymphoma, under surveillance with regular follow-ups. - Continue regular follow-up with oncologist.  Depression Depression managed with Wellbutrin  and Zoloft , reports improved mood. - Continue Wellbutrin  and Zoloft . - Provide 90-day supply of medications.  Chronic small vessel ischemic disease of brain with cerebellar atrophy Chronic small vessel ischemic disease with cerebellar atrophy, balance issues, no syncopal episodes since 2022. - Refer to neurologist for further evaluation of balance issues.  Atherosclerosis of aorta Atherosclerosis of the aorta noted on previous imaging.

## 2024-08-05 ENCOUNTER — Other Ambulatory Visit (HOSPITAL_COMMUNITY): Payer: Self-pay

## 2024-08-05 ENCOUNTER — Encounter: Payer: Self-pay | Admitting: Hematology and Oncology

## 2024-08-05 ENCOUNTER — Ambulatory Visit: Payer: Self-pay | Admitting: Family Medicine

## 2024-08-05 ENCOUNTER — Telehealth: Payer: Self-pay | Admitting: Pharmacy Technician

## 2024-08-05 NOTE — Telephone Encounter (Signed)
 Pharmacy Patient Advocate Encounter   Received notification from Onbase that prior authorization for traMADol  HCl 50MG  tablets  is required/requested.   Insurance verification completed.   The patient is insured through [employer].   Per test claim: The current 90 day co-pay is, $7.01.  No PA needed at this time. This test claim was processed through [facility]- copay amounts may vary at other pharmacies due to pharmacy/plan contracts, or as the patient moves through the different stages of their insurance plan.

## 2024-08-12 ENCOUNTER — Telehealth: Payer: Self-pay | Admitting: Family Medicine

## 2024-08-12 ENCOUNTER — Other Ambulatory Visit (HOSPITAL_COMMUNITY): Payer: Self-pay

## 2024-08-12 NOTE — Telephone Encounter (Signed)
 Copied from CRM [id number]. Topic: General - Call Back - No Documentation >> Aug 12, 2024 11:26 AM Olam RAMAN wrote: Reason for CRM: pt is calling stated when she picked up her medication, phamacy advised they faxed over a PA to provider to fill out. Per notes:  Received notification from Onbase that prior authorization for traMADol  HCl 50MG  tablets  is required/requested.   Insurance verification completed.   The patient is insured through [employer]. CB [phone]

## 2024-08-12 NOTE — Telephone Encounter (Signed)
 Please see telephone encounter 08/05/2024, also I did a test bill today and it looks like it's too soon to refill, last fill date 08/09/2024.

## 2024-08-18 ENCOUNTER — Ambulatory Visit: Admitting: Family Medicine

## 2024-08-25 DIAGNOSIS — R2689 Other abnormalities of gait and mobility: Secondary | ICD-10-CM | POA: Diagnosis not present

## 2024-10-21 ENCOUNTER — Other Ambulatory Visit: Payer: Self-pay | Admitting: Family Medicine

## 2024-10-21 DIAGNOSIS — I700 Atherosclerosis of aorta: Secondary | ICD-10-CM

## 2024-10-21 DIAGNOSIS — I6782 Cerebral ischemia: Secondary | ICD-10-CM

## 2024-10-22 NOTE — Telephone Encounter (Signed)
 Requested Prescriptions  Pending Prescriptions Disp Refills   atorvastatin  (LIPITOR) 20 MG tablet [Pharmacy Med Name: Atorvastatin  Calcium  20 MG Oral Tablet] 90 tablet 0    Sig: Take 1 tablet by mouth once daily     Cardiovascular:  Antilipid - Statins Failed - 10/22/2024  3:20 PM      Failed - Lipid Panel in normal range within the last 12 months    Cholesterol  Date Value Ref Range Status  01/31/2024 130 <200 mg/dL Final   LDL Cholesterol (Calc)  Date Value Ref Range Status  01/31/2024 47 mg/dL (calc) Final    Comment:    Reference range: <100 . Desirable range <100 mg/dL for primary prevention;   <70 mg/dL for patients with CHD or diabetic patients  with > or = 2 CHD risk factors. SABRA LDL-C is now calculated using the Martin-Hopkins  calculation, which is a validated novel method providing  better accuracy than the Friedewald equation in the  estimation of LDL-C.  Gladis APPLETHWAITE et al. SANDREA. 7986;689(80): 2061-2068  (http://education.[facility].com/faq/FAQ164)    HDL  Date Value Ref Range Status  01/31/2024 69 > OR = 50 mg/dL Final   Triglycerides  Date Value Ref Range Status  01/31/2024 65 <150 mg/dL Final         Passed - Patient is not pregnant      Passed - Valid encounter within last 12 months    Recent Outpatient Visits           2 months ago Hodgkin's disease, stage IIA [facility])   [facility] Glenard Mire, MD   8 months ago Atherosclerosis of aorta   [facility] Sowles, Krichna, MD

## 2024-11-09 ENCOUNTER — Other Ambulatory Visit: Payer: Self-pay | Admitting: Family Medicine

## 2024-11-09 DIAGNOSIS — M545 Low back pain, unspecified: Secondary | ICD-10-CM

## 2024-11-10 NOTE — Telephone Encounter (Signed)
 Requested medications are due for refill today.  yes  Requested medications are on the active medications list.  yes  Last refill. 08/04/2024 #90 0 rf  Future visit scheduled.   yes  Notes to clinic.  Refill not delegated.    Requested Prescriptions  Pending Prescriptions Disp Refills   traMADol  (ULTRAM ) 50 MG tablet [Pharmacy Med Name: traMADol  HCl 50 MG Oral Tablet] 90 tablet 0    Sig: TAKE 1 TABLET BY MOUTH IN THE MORNING     Not Delegated - Analgesics:  Opioid Agonists Failed - 11/10/2024  5:50 PM      Failed - This refill cannot be delegated      Failed - Urine Drug Screen completed in last 360 days      Failed - Valid encounter within last 3 months    Recent Outpatient Visits           3 months ago Hodgkin's disease, stage IIA [facility])   [place] [facility] Glenard Mire, MD   9 months ago Atherosclerosis of aorta   [facility] Glenard Mire, MD

## 2024-11-11 ENCOUNTER — Ambulatory Visit: Admitting: Family Medicine

## 2024-11-11 ENCOUNTER — Encounter: Payer: Self-pay | Admitting: Family Medicine

## 2024-11-11 DIAGNOSIS — I6782 Cerebral ischemia: Secondary | ICD-10-CM | POA: Diagnosis not present

## 2024-11-11 DIAGNOSIS — I10 Essential (primary) hypertension: Secondary | ICD-10-CM

## 2024-11-11 DIAGNOSIS — G8929 Other chronic pain: Secondary | ICD-10-CM

## 2024-11-11 DIAGNOSIS — R2681 Unsteadiness on feet: Secondary | ICD-10-CM | POA: Diagnosis not present

## 2024-11-11 DIAGNOSIS — R21 Rash and other nonspecific skin eruption: Secondary | ICD-10-CM | POA: Diagnosis not present

## 2024-11-11 DIAGNOSIS — F341 Dysthymic disorder: Secondary | ICD-10-CM

## 2024-11-11 DIAGNOSIS — R7989 Other specified abnormal findings of blood chemistry: Secondary | ICD-10-CM

## 2024-11-11 DIAGNOSIS — M810 Age-related osteoporosis without current pathological fracture: Secondary | ICD-10-CM

## 2024-11-11 DIAGNOSIS — G3789 Other specified demyelinating diseases of central nervous system: Secondary | ICD-10-CM | POA: Diagnosis not present

## 2024-11-11 DIAGNOSIS — N8110 Cystocele, unspecified: Secondary | ICD-10-CM | POA: Diagnosis not present

## 2024-11-11 DIAGNOSIS — C8190 Hodgkin lymphoma, unspecified, unspecified site: Secondary | ICD-10-CM

## 2024-11-11 DIAGNOSIS — K219 Gastro-esophageal reflux disease without esophagitis: Secondary | ICD-10-CM | POA: Diagnosis not present

## 2024-11-11 DIAGNOSIS — M545 Low back pain, unspecified: Secondary | ICD-10-CM

## 2024-11-11 MED ORDER — TRAMADOL HCL 50 MG PO TABS: 50.0000 mg | ORAL_TABLET | ORAL | 0 refills | Status: AC

## 2024-11-11 MED ORDER — KETOCONAZOLE 2 % EX CREA: 1.0000 | TOPICAL_CREAM | Freq: Every day | CUTANEOUS | 0 refills | Status: AC

## 2024-11-11 NOTE — Progress Notes (Signed)
 Name: Mary Todd   MRN: [id number]    DOB: Aug 18, 1946   Date:11/11/2024       Progress Note  Subjective  Chief Complaint  Chief Complaint  Patient presents with   Medical Management of Chronic Issues   Discussed the use of AI scribe software for clinical note transcription with the patient, who gave verbal consent to proceed.  History of Present Illness Mary Todd is a 79 year old female who presents for a routine follow-up.  She has a history of chronic ischemic brain disease with microvascular changes. No recent syncope episodes have occurred, with the last in October 2022 and is under the care of neurologist. Episodes  often related to dehydration and stress.  She has a history of Hodgkin's disease, stage 2A, and is monitored annually by her hematologist. She receives Prolia  injections for osteoporosis, which developed post-breast cancer treatment. Her breast cancer was treated with a lumpectomy on the right breast. She currently has an itchy rash on the left breast for about two months, treated with cortisone cream.  She experiences chronic mid to low back pain and osteoarthritis of the knee, managed with daily tramadol . She reports gait instability and balance problems as well as chronic back pain.  Hypertension is controlled with metoprolol  50 mg XL and amlodipine  5 mg, with blood pressure at 126/80 mmHg. Dyslipidemia is treated with atorvastatin  20 mg, with no muscle pain reported.  She is morbidly obese with a BMI over 40 but has recently lost weight, from 231 lbs in October to 226 lbs currently, and is attempting dietary changes and portion control.  She has a history of cystocele, improved with dietary changes and psychotherapy. No current bowel incontinence is reported.  Depression is managed with Wellbutrin  and Zoloft , with a negative PHQ-9 score. She also takes tramadol  for chronic pain, primarily in the morning, and supplements with Tylenol  as needed.     Patient Active Problem List   Diagnosis Date Noted   Full incontinence of feces 08/04/2024   Low vitamin B12 level 08/04/2024   Pre-diabetes 08/04/2024   Dysthymia 08/04/2024   Chronic midline low back pain without sciatica 10/01/2023   Hodgkin's disease, stage IIA (HCC) 10/01/2023   Ischemic demyelination of brain (HCC) 10/01/2023   Atherosclerosis of aorta 10/01/2023   Polyp of transverse colon    Total knee replacement status, right 10/03/2019   Anterolisthesis 08/06/2017   DDD (degenerative disc disease), lumbosacral 08/06/2017   Chondromalacia patellae 07/03/2017   Osteoporosis 12/14/2016   Morbid obesity (HCC) 07/17/2016   Prolapsed cervical intervertebral disc 06/06/2016   Spinal stenosis in cervical region 05/22/2016   Thyroid  nodule 05/22/2016   GERD without esophagitis 03/16/2016   Neck muscle spasm 03/16/2016   History of back surgery 03/16/2016   Osteoarthritis of both knees 03/16/2016   Large breasts 03/16/2016   Essential hypertension 03/16/2016   Perennial allergic rhinitis with seasonal variation 03/16/2016   History of pneumococcal pneumonia 09/20/2015   History of Hodgkin's lymphoma 07/15/2014   Breast cancer, right (HCC) 06/26/2013    Past Surgical History:  Procedure Laterality Date   ABDOMINAL HYSTERECTOMY     BACK SURGERY     BREAST BIOPSY Right 2011   radiation   BREAST LUMPECTOMY Right 2011   w/ radiation   BREAST SURGERY Right 2011   right breast lumpectomy,L/SN/R for right breast CA    CATARACT EXTRACTION W/PHACO Left 06/10/2018   Procedure: CATARACT EXTRACTION PHACO AND INTRAOCULAR LENS PLACEMENT (IOC) LEFT;  Surgeon: Myrna,  Adine Anes, MD;  Location: [facility];  Service: Ophthalmology;  Laterality: Left;  Diabetic - oral meds   CATARACT EXTRACTION W/PHACO Right 07/15/2018   Procedure: CATARACT EXTRACTION PHACO AND INTRAOCULAR LENS PLACEMENT (IOC) RIGHT;  Surgeon: Myrna Adine Anes, MD;  Location: [facility];  Service:  Ophthalmology;  Laterality: Right;   COLONOSCOPY  2003   COLONOSCOPY N/A 03/09/2022   Procedure: COLONOSCOPY WITH BIOPSY;  Surgeon: Jinny Carmine, MD;  Location: [facility];  Service: Endoscopy;  Laterality: N/A;   EYE SURGERY  cataract both eyes   JOINT REPLACEMENT  knee 2020   POLYPECTOMY N/A 03/09/2022   Procedure: POLYPECTOMY;  Surgeon: Jinny Carmine, MD;  Location: [facility];  Service: Endoscopy;  Laterality: N/A;   PORT A CATH REVISION     REPLACEMENT TOTAL KNEE Right 10/03/2019   DR. Bowers    TOTAL KNEE ARTHROPLASTY Left 12/20/2023   Dr. Leora [facility]   TUBAL LIGATION  1975   WRIST SURGERY  2005    Family History  Problem Relation Age of Onset   Cancer Father    Bone cancer Father    Breast cancer Mother 16   Stroke Mother    Colon cancer Maternal Grandmother    Breast cancer Cousin     Social History   Tobacco Use   Smoking status: Never    Passive exposure: Never   Smokeless tobacco: Never   Tobacco comments:    smoking cessation materials not required  Substance Use Topics   Alcohol use: Yes    Comment: occasionally    Current Medications[1]  Allergies[2]  I personally reviewed active problem list, medication list, allergies, family history with the patient/caregiver today.   ROS  Ten systems reviewed and is negative except as mentioned in HPI    Objective Physical Exam  CONSTITUTIONAL: Patient appears well-developed and well-nourished. No distress. HEENT: Head atraumatic, normocephalic, neck supple. CARDIOVASCULAR: Normal rate, regular rhythm and normal heart sounds. No murmur heard. No BLE edema. PULMONARY: Effort normal and breath sounds normal. No respiratory distress. ABDOMINAL: There is no tenderness or distention. MUSCULOSKELETAL: Normal gait. Without gross motor or sensory deficit. PSYCHIATRIC: Patient has a normal mood and affect. Behavior is normal. Judgment and thought content normal. SKIN: Rash under  left breast, possibly fungal.     Vitals:   11/11/24 0935  BP: 126/80  Pulse: 88  Resp: 16  SpO2: 95%  Weight: 226 lb 4.8 oz (102.6 kg)  Height: 5' 3 (1.6 m)    Body mass index is 40.09 kg/m.    PHQ2/9:    11/11/2024    9:32 AM 08/04/2024    1:59 PM 06/26/2024    3:37 PM 01/31/2024   10:37 AM 10/01/2023   11:20 AM  Depression screen PHQ 2/9  Decreased Interest 0 0 0 0 0  Down, Depressed, Hopeless 0 0 0 0 0  PHQ - 2 Score 0 0 0 0 0  Altered sleeping 0 0 0 0 0  Tired, decreased energy 0 0 0 0 0  Change in appetite 0 0 0 0 0  Feeling bad or failure about yourself  0 0 0 0 0  Trouble concentrating 0 0 0 0 0  Moving slowly or fidgety/restless 0 0 0 0 0  Suicidal thoughts 0 0 0 0 0  PHQ-9 Score 0 0  0  0  0   Difficult doing work/chores Not difficult at all Not difficult at all Not difficult at all Not  difficult at all Not difficult at all     Data saved with a previous flowsheet row definition    phq 9 is negative  Fall Risk:    11/11/2024    9:32 AM 08/04/2024    1:59 PM 06/26/2024    3:40 PM 06/25/2024   11:57 AM 01/31/2024   10:37 AM  Fall Risk   Falls in the past year? 0 0 0 0  1  Number falls in past yr: 0 0 0 0  0  Injury with Fall? 0 0  0  0   0   Risk for fall due to : No Fall Risks No Fall Risks No Fall Risks  Impaired balance/gait  Follow up Falls evaluation completed Falls evaluation completed Falls evaluation completed;Falls prevention discussed  Education provided;Falls evaluation completed;Falls prevention discussed     Manually entered by patient   Data saved with a previous flowsheet row definition     Assessment & Plan Chronic midline low back pain Managed with tramadol , effective for pain control. - Referred to physical therapy for back pain and gait instability. - Prescribed tramadol  for pain management.  Gait instability and balance impairment Likely due to cerebral microvascular ischemic changes. No falls reported. - Referred to physical  therapy for balance improvement. - Encouraged participation in balance classes at the gym.  Age-related osteoporosis Managed with Prolia  injections by endocrinologist.  Rash, left breast Itchy rash for two months, likely fungal. No malignancy signs. Previous right breast cancer noted. - Prescribed clotrimazole cream for suspected fungal infection. - Advised to mix clotrimazole with hydrocortisone cream. - Instructed to monitor rash and report if no improvement in 1-2 weeks. - Sent message to oncologist for further evaluation if needed.  Hodgkin's lymphoma, stage IIA Monitored by hematologist with annual visits.  Essential hypertension Well-controlled with metoprolol  and amlodipine . BP 126/80 mmHg.  Morbid obesity BMI over 40. Recent weight loss from 231 lbs to 226 lbs. Actively working on diet and portion control.  Low vitamin B12 level Low vitamin B12 level. Not currently taking supplements. - Plan to re-evaluate B12 levels.  Cystocele Symptoms improved with dietary changes and fiber intake.  Dysthymia Managed with Wellbutrin  and Zoloft . PHQ-9 score is negative.  Chronic ischemic demyelination of brain Associated with gait instability and balance impairment. No memory deficits noted.  GERD Symptoms under control, currently not on medication.        [1]  Current Outpatient Medications:    acetaminophen  (TYLENOL ) 650 MG CR tablet, Take 1,300 mg by mouth every 8 (eight) hours as needed for pain., Disp: , Rfl:    amLODipine -valsartan  (EXFORGE ) 5-160 MG tablet, Take 1 tablet by mouth daily., Disp: 90 tablet, Rfl: 1   atorvastatin  (LIPITOR) 20 MG tablet, Take 1 tablet by mouth once daily, Disp: 90 tablet, Rfl: 0   buPROPion  (WELLBUTRIN  XL) 150 MG 24 hr tablet, Take 1 tablet (150 mg total) by mouth daily., Disp: 90 tablet, Rfl: 1   CALCIUM -MAGNESIUM PO, Take 1 tablet by mouth daily., Disp: , Rfl:    cetirizine (ZYRTEC) 10 MG tablet, Take 10 mg by mouth daily., Disp: , Rfl:     Cholecalciferol (VITAMIN D -3 PO), Take 1 tablet by mouth daily., Disp: , Rfl:    denosumab  (PROLIA ) 60 MG/ML SOSY injection, Inject 60 mg into the skin every 6 (six) months., Disp: , Rfl:    metoprolol  succinate (TOPROL -XL) 50 MG 24 hr tablet, Take 1 tablet (50 mg total) by mouth daily. Take with or immediately following  a meal., Disp: 90 tablet, Rfl: 1   Multiple Vitamin (MULTIVITAMIN) tablet, Take 1 tablet by mouth daily., Disp: , Rfl:    nystatin  (MYCOSTATIN /NYSTOP ) powder, APPLY  POWDER TOPICALLY TWICE DAILY, Disp: 60 g, Rfl: 0   pantoprazole  (PROTONIX ) 40 MG tablet, Take 1 tablet (40 mg total) by mouth daily., Disp: 90 tablet, Rfl: 1   sertraline  (ZOLOFT ) 100 MG tablet, Take 1 tablet (100 mg total) by mouth every morning., Disp: 90 tablet, Rfl: 1   traMADol  (ULTRAM ) 50 MG tablet, Take 1 tablet (50 mg total) by mouth every morning., Disp: 90 tablet, Rfl: 0 [2]  Allergies Allergen Reactions   Shellfish Allergy Hives   Amoxicillin-Pot Clavulanate Nausea Only   Shellfish Protein-Containing Drug Products Hives

## 2024-12-11 ENCOUNTER — Ambulatory Visit: Payer: Medicare Other | Admitting: Oncology

## 2024-12-11 ENCOUNTER — Ambulatory Visit

## 2024-12-11 ENCOUNTER — Ambulatory Visit: Payer: Medicare Other

## 2024-12-11 ENCOUNTER — Other Ambulatory Visit: Payer: Medicare Other

## 2024-12-11 ENCOUNTER — Ambulatory Visit: Admitting: Oncology

## 2025-02-11 ENCOUNTER — Ambulatory Visit: Admitting: Family Medicine

## 2025-07-03 ENCOUNTER — Ambulatory Visit
